# Patient Record
Sex: Male | Born: 1935 | Race: White | Hispanic: No | Marital: Married | Smoking: Former smoker
Health system: Southern US, Community
[De-identification: ages and names within clinical notes are randomized; demographics above are authoritative.]

## PROBLEM LIST (undated history)

## (undated) DIAGNOSIS — I9789 Other postprocedural complications and disorders of the circulatory system, not elsewhere classified: Secondary | ICD-10-CM

## (undated) DIAGNOSIS — G250 Essential tremor: Secondary | ICD-10-CM

## (undated) DIAGNOSIS — G454 Transient global amnesia: Secondary | ICD-10-CM

## (undated) DIAGNOSIS — Z87448 Personal history of other diseases of urinary system: Secondary | ICD-10-CM

## (undated) DIAGNOSIS — G5790 Unspecified mononeuropathy of unspecified lower limb: Secondary | ICD-10-CM

## (undated) DIAGNOSIS — M1990 Unspecified osteoarthritis, unspecified site: Secondary | ICD-10-CM

## (undated) DIAGNOSIS — Z8551 Personal history of malignant neoplasm of bladder: Secondary | ICD-10-CM

## (undated) DIAGNOSIS — I2510 Atherosclerotic heart disease of native coronary artery without angina pectoris: Secondary | ICD-10-CM

## (undated) DIAGNOSIS — Z951 Presence of aortocoronary bypass graft: Secondary | ICD-10-CM

## (undated) DIAGNOSIS — R7303 Prediabetes: Secondary | ICD-10-CM

## (undated) DIAGNOSIS — I420 Dilated cardiomyopathy: Secondary | ICD-10-CM

## (undated) DIAGNOSIS — R2689 Other abnormalities of gait and mobility: Secondary | ICD-10-CM

## (undated) DIAGNOSIS — R251 Tremor, unspecified: Secondary | ICD-10-CM

## (undated) DIAGNOSIS — Z9861 Coronary angioplasty status: Secondary | ICD-10-CM

## (undated) DIAGNOSIS — Z973 Presence of spectacles and contact lenses: Secondary | ICD-10-CM

## (undated) DIAGNOSIS — G459 Transient cerebral ischemic attack, unspecified: Secondary | ICD-10-CM

## (undated) DIAGNOSIS — Z7901 Long term (current) use of anticoagulants: Secondary | ICD-10-CM

## (undated) DIAGNOSIS — Z87442 Personal history of urinary calculi: Secondary | ICD-10-CM

## (undated) DIAGNOSIS — Z85828 Personal history of other malignant neoplasm of skin: Secondary | ICD-10-CM

## (undated) DIAGNOSIS — Z9889 Other specified postprocedural states: Secondary | ICD-10-CM

## (undated) DIAGNOSIS — G629 Polyneuropathy, unspecified: Secondary | ICD-10-CM

## (undated) DIAGNOSIS — R351 Nocturia: Secondary | ICD-10-CM

## (undated) DIAGNOSIS — E785 Hyperlipidemia, unspecified: Secondary | ICD-10-CM

## (undated) DIAGNOSIS — I4891 Unspecified atrial fibrillation: Secondary | ICD-10-CM

## (undated) DIAGNOSIS — C679 Malignant neoplasm of bladder, unspecified: Secondary | ICD-10-CM

## (undated) DIAGNOSIS — Z8673 Personal history of transient ischemic attack (TIA), and cerebral infarction without residual deficits: Secondary | ICD-10-CM

## (undated) DIAGNOSIS — M810 Age-related osteoporosis without current pathological fracture: Secondary | ICD-10-CM

## (undated) DIAGNOSIS — Z972 Presence of dental prosthetic device (complete) (partial): Secondary | ICD-10-CM

## (undated) HISTORY — DX: Presence of aortocoronary bypass graft: Z95.1

## (undated) HISTORY — DX: Unspecified atrial fibrillation: I48.91

## (undated) HISTORY — DX: Malignant neoplasm of bladder, unspecified: C67.9

## (undated) HISTORY — PX: CATARACT EXTRACTION W/ INTRAOCULAR LENS  IMPLANT, BILATERAL: SHX1307

## (undated) HISTORY — PX: COCCYX REMOVAL: SHX600

## (undated) HISTORY — DX: Atherosclerotic heart disease of native coronary artery without angina pectoris: I25.10

## (undated) HISTORY — PX: CORONARY ARTERY BYPASS GRAFT: SHX141

## (undated) HISTORY — DX: Dilated cardiomyopathy: I42.0

## (undated) HISTORY — DX: Atherosclerotic heart disease of native coronary artery without angina pectoris: Z98.61

## (undated) HISTORY — PX: FINGER SURGERY: SHX640

## (undated) HISTORY — PX: TONSILLECTOMY: SUR1361

## (undated) HISTORY — DX: Hyperlipidemia, unspecified: E78.5

## (undated) HISTORY — DX: Other postprocedural complications and disorders of the circulatory system, not elsewhere classified: I97.89

## (undated) HISTORY — PX: APPENDECTOMY: SHX54

---
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
## 1957-09-02 DIAGNOSIS — Z87442 Personal history of urinary calculi: Secondary | ICD-10-CM

## 1957-09-02 HISTORY — DX: Personal history of urinary calculi: Z87.442

## 1986-09-02 HISTORY — PX: OTHER SURGICAL HISTORY: SHX169

## 1987-09-03 HISTORY — PX: KIDNEY STONE SURGERY: SHX686

## 1989-09-02 HISTORY — PX: CORONARY ANGIOPLASTY WITH STENT PLACEMENT: SHX49

## 1998-07-20 ENCOUNTER — Ambulatory Visit: Admission: RE | Admit: 1998-07-20 | Discharge: 1998-07-20 | Payer: Self-pay | Admitting: Cardiology

## 2000-06-20 ENCOUNTER — Encounter: Admission: RE | Admit: 2000-06-20 | Discharge: 2000-06-20 | Payer: Self-pay | Admitting: Orthopedic Surgery

## 2000-06-20 ENCOUNTER — Encounter: Payer: Self-pay | Admitting: Orthopedic Surgery

## 2004-10-18 HISTORY — PX: DOPPLER ECHOCARDIOGRAPHY: SHX263

## 2006-03-17 ENCOUNTER — Ambulatory Visit (HOSPITAL_COMMUNITY): Admission: EM | Admit: 2006-03-17 | Discharge: 2006-03-17 | Payer: Self-pay | Admitting: Emergency Medicine

## 2007-12-29 ENCOUNTER — Encounter: Admission: RE | Admit: 2007-12-29 | Discharge: 2007-12-29 | Payer: Self-pay | Admitting: Orthopedic Surgery

## 2010-09-02 HISTORY — PX: CARDIOVERSION: SHX1299

## 2010-10-03 DIAGNOSIS — Z951 Presence of aortocoronary bypass graft: Secondary | ICD-10-CM

## 2010-10-03 DIAGNOSIS — I4891 Unspecified atrial fibrillation: Secondary | ICD-10-CM

## 2010-10-03 HISTORY — DX: Presence of aortocoronary bypass graft: Z95.1

## 2010-10-03 HISTORY — DX: Other postprocedural complications and disorders of the circulatory system, not elsewhere classified: I48.91

## 2010-10-17 ENCOUNTER — Inpatient Hospital Stay (HOSPITAL_COMMUNITY)
Admission: RE | Admit: 2010-10-17 | Discharge: 2010-10-22 | DRG: 234 | Disposition: A | Discharge status: Home / Self Care | Payer: Medicare Other | Source: Ambulatory Visit | Attending: Thoracic Surgery (Cardiothoracic Vascular Surgery) | Admitting: Thoracic Surgery (Cardiothoracic Vascular Surgery)

## 2010-10-17 DIAGNOSIS — Z9861 Coronary angioplasty status: Secondary | ICD-10-CM

## 2010-10-17 DIAGNOSIS — E8779 Other fluid overload: Secondary | ICD-10-CM | POA: Diagnosis not present

## 2010-10-17 DIAGNOSIS — Y840 Cardiac catheterization as the cause of abnormal reaction of the patient, or of later complication, without mention of misadventure at the time of the procedure: Secondary | ICD-10-CM | POA: Diagnosis present

## 2010-10-17 DIAGNOSIS — Z87891 Personal history of nicotine dependence: Secondary | ICD-10-CM

## 2010-10-17 DIAGNOSIS — Y832 Surgical operation with anastomosis, bypass or graft as the cause of abnormal reaction of the patient, or of later complication, without mention of misadventure at the time of the procedure: Secondary | ICD-10-CM | POA: Diagnosis not present

## 2010-10-17 DIAGNOSIS — T82897A Other specified complication of cardiac prosthetic devices, implants and grafts, initial encounter: Secondary | ICD-10-CM | POA: Diagnosis present

## 2010-10-17 DIAGNOSIS — Z01810 Encounter for preprocedural cardiovascular examination: Secondary | ICD-10-CM

## 2010-10-17 DIAGNOSIS — I2510 Atherosclerotic heart disease of native coronary artery without angina pectoris: Principal | ICD-10-CM | POA: Diagnosis present

## 2010-10-17 DIAGNOSIS — D62 Acute posthemorrhagic anemia: Secondary | ICD-10-CM | POA: Diagnosis not present

## 2010-10-17 DIAGNOSIS — Z7982 Long term (current) use of aspirin: Secondary | ICD-10-CM

## 2010-10-17 DIAGNOSIS — I4891 Unspecified atrial fibrillation: Secondary | ICD-10-CM | POA: Diagnosis not present

## 2010-10-17 DIAGNOSIS — Z22322 Carrier or suspected carrier of Methicillin resistant Staphylococcus aureus: Secondary | ICD-10-CM

## 2010-10-17 DIAGNOSIS — Y9210 Unspecified residential institution as the place of occurrence of the external cause: Secondary | ICD-10-CM | POA: Diagnosis not present

## 2010-10-17 DIAGNOSIS — E7800 Pure hypercholesterolemia, unspecified: Secondary | ICD-10-CM | POA: Diagnosis present

## 2010-10-17 DIAGNOSIS — I519 Heart disease, unspecified: Secondary | ICD-10-CM | POA: Diagnosis not present

## 2010-10-17 DIAGNOSIS — I209 Angina pectoris, unspecified: Secondary | ICD-10-CM | POA: Diagnosis present

## 2010-10-17 DIAGNOSIS — Y92009 Unspecified place in unspecified non-institutional (private) residence as the place of occurrence of the external cause: Secondary | ICD-10-CM

## 2010-10-17 DIAGNOSIS — E785 Hyperlipidemia, unspecified: Secondary | ICD-10-CM | POA: Diagnosis present

## 2010-10-17 HISTORY — PX: CARDIAC CATHETERIZATION: SHX172

## 2010-10-17 LAB — PROTIME-INR
INR: 0.95 (ref 0.00–1.49)
Prothrombin Time: 12.9 seconds (ref 11.6–15.2)

## 2010-10-17 LAB — ABO/RH: ABO/RH(D): A POS

## 2010-10-17 LAB — TYPE AND SCREEN: ABO/RH(D): A POS

## 2010-10-18 ENCOUNTER — Inpatient Hospital Stay (HOSPITAL_COMMUNITY): Payer: Medicare Other

## 2010-10-18 DIAGNOSIS — I2510 Atherosclerotic heart disease of native coronary artery without angina pectoris: Secondary | ICD-10-CM

## 2010-10-18 LAB — CREATININE, SERUM
Creatinine, Ser: 0.85 mg/dL (ref 0.4–1.5)
GFR calc non Af Amer: >60 mL/min (ref >60)

## 2010-10-18 LAB — POCT I-STAT 3, ART BLOOD GAS (G3+)
Acid-base deficit: 1 mmol/L (ref 0.0–2.0)
Bicarbonate: 23.4 mEq/L (ref 20.0–24.0)
Bicarbonate: 21.1 mEq/L (ref 20.0–24.0)
O2 Saturation: 99 %
Patient temperature: 33.6
Patient temperature: 37.3
TCO2: 24 mmol/L (ref 0–100)
pCO2 arterial: 39.3 mmHg (ref 35.0–45.0)
pH, Arterial: 7.34 — ABNORMAL LOW (ref 7.350–7.450)
pO2, Arterial: 140 mmHg — ABNORMAL HIGH (ref 80.0–100.0)
pO2, Arterial: 124 mmHg — ABNORMAL HIGH (ref 80.0–100.0)

## 2010-10-18 LAB — URINALYSIS, MICROSCOPIC ONLY
Bilirubin Urine: NEGATIVE
Hgb urine dipstick: NEGATIVE
Specific Gravity, Urine: 1.017 (ref 1.005–1.030)
Urine Glucose, Fasting: NEGATIVE mg/dL
Urobilinogen, UA: 1 mg/dL (ref 0.0–1.0)

## 2010-10-18 LAB — POCT I-STAT 4, (NA,K, GLUC, HGB,HCT)
Glucose, Bld: 98 mg/dL (ref 70–99)
Glucose, Bld: 96 mg/dL (ref 70–99)
Hemoglobin: 10.5 g/dL — ABNORMAL LOW (ref 13.0–17.0)
Hemoglobin: 10.2 g/dL — ABNORMAL LOW (ref 13.0–17.0)
Potassium: 3.9 mEq/L (ref 3.5–5.1)
Potassium: 3.8 mEq/L (ref 3.5–5.1)
Potassium: 3.8 mEq/L (ref 3.5–5.1)
Potassium: 3.9 mEq/L (ref 3.5–5.1)
Sodium: 139 mEq/L (ref 135–145)
Sodium: 140 mEq/L (ref 135–145)
Sodium: 142 mEq/L (ref 135–145)

## 2010-10-18 LAB — COMPREHENSIVE METABOLIC PANEL
ALT: 18 U/L (ref 0–53)
AST: 24 U/L (ref 0–37)
Calcium: 9.1 mg/dL (ref 8.4–10.5)
Creatinine, Ser: 0.91 mg/dL (ref 0.4–1.5)
GFR calc Af Amer: >60 mL/min (ref >60)
GFR calc non Af Amer: >60 mL/min (ref >60)
Sodium: 140 mEq/L (ref 135–145)
Total Protein: 5.7 g/dL — ABNORMAL LOW (ref 6.0–8.3)

## 2010-10-18 LAB — BLOOD GAS, ARTERIAL
Drawn by: 312881
FIO2: 0.21 %
Patient temperature: 98.6
TCO2: 26.9 mmol/L (ref 0–100)
pCO2 arterial: 38.9 mmHg (ref 35.0–45.0)
pH, Arterial: 7.435 (ref 7.350–7.450)

## 2010-10-18 LAB — CBC
HCT: 32.3 % — ABNORMAL LOW (ref 39.0–52.0)
Hemoglobin: 11.1 g/dL — ABNORMAL LOW (ref 13.0–17.0)
MCH: 31.6 pg (ref 26.0–34.0)
MCHC: 33.9 g/dL (ref 30.0–36.0)
MCHC: 34.4 g/dL (ref 30.0–36.0)
MCV: 92 fL (ref 78.0–100.0)
MCV: 92.6 fL (ref 78.0–100.0)
Platelets: 139 10*3/uL — ABNORMAL LOW (ref 150–400)
Platelets: 115 10*3/uL — ABNORMAL LOW (ref 150–400)
Platelets: 110 10*3/uL — ABNORMAL LOW (ref 150–400)
RBC: 3.51 MIL/uL — ABNORMAL LOW (ref 4.22–5.81)
RDW: 12.2 % (ref 11.5–15.5)
RDW: 12.2 % (ref 11.5–15.5)
WBC: 4.9 10*3/uL (ref 4.0–10.5)
WBC: 5.6 10*3/uL (ref 4.0–10.5)
WBC: 7.5 10*3/uL (ref 4.0–10.5)

## 2010-10-18 LAB — POCT I-STAT, CHEM 8
BUN: 14 mg/dL (ref 6–23)
Hemoglobin: 11.2 g/dL — ABNORMAL LOW (ref 13.0–17.0)
Potassium: 4.2 mEq/L (ref 3.5–5.1)
Sodium: 142 mEq/L (ref 135–145)
TCO2: 21 mmol/L (ref 0–100)

## 2010-10-18 LAB — POCT I-STAT GLUCOSE
Operator id: 177011
Operator id: 3406

## 2010-10-18 LAB — GLUCOSE, CAPILLARY: Glucose-Capillary: 112 mg/dL — ABNORMAL HIGH (ref 70–99)

## 2010-10-18 LAB — LIPID PANEL
Cholesterol: 140 mg/dL (ref 0–200)
HDL: 42 mg/dL (ref >39)
Total CHOL/HDL Ratio: 3.3 RATIO
Triglycerides: 95 mg/dL (ref <150)

## 2010-10-18 LAB — APTT
aPTT: 30 seconds (ref 24–37)
aPTT: 31 seconds (ref 24–37)

## 2010-10-18 NOTE — Consult Note (Signed)
[name], [name]               ACCOUNT NO.:  [id number]  MEDICAL RECORD NO.:  [id number]           PATIENT TYPE:  I  LOCATION:  4729                         FACILITY:  [facility]  PHYSICIAN:  Salvatore Decent. Cornelius Moras, M.D. DATE OF BIRTH:  [birth year]  DATE OF CONSULTATION:  10/17/2010 DATE OF DISCHARGE:                                CONSULTATION   REQUESTING PHYSICIAN:  Ritta Slot, MD.  REASON FOR CONSULTATION:  Severe two-vessel coronary artery disease with new-onset angina pectoris.  HISTORY OF PRESENT ILLNESS:  Mr. Reason is an 77 year old gentleman with history of coronary artery disease and hyperlipidemia.  Cardiac history dates back more than 20 years ago wherein he underwent PCI and stenting of his LAD and RCA for two vessel coronary artery disease with symptoms of exertional angina.  He has done well since then until presently.  The patient remains quite active physically.  He exercises regularly.  About a month ago, he first began to experience exertional shortness of breath which was new for him.  Symptoms were mild.  Last week, the patient developed new-onset exertional chest discomfort described as a pressure-like pain across his midchest that was brought on with physical activity and relieved by rest.  He presented to Dr. Lynnea Ferrier who scheduled elective catheterization for earlier today. Catheterization demonstrates severe two-vessel coronary artery disease with 80% stenosis of the proximal left anterior descending coronary artery and high-grade 95% stenosis of mid right coronary artery with 75% stenosis of the distal right coronary artery.  There is right-dominant coronary circulation.  There is normal left ventricular function.  No other significant abnormality is noted.  The patient has coronary anatomy that is felt to be unfavorable for repeat PCI and stenting, and he has been referred for elective surgical revascularization.  REVIEW OF SYSTEMS:  GENERAL:  The patient  reports normal appetite.  He has not been gaining nor losing weight recently.  CARDIAC:  Notable for new-onset symptoms of exertional chest discomfort consistent with angina pectoris.  The patient denies resting chest pain.  The patient denies resting shortness of breath.  He has had recent onset of exertional shortness of breath.  He denies PND, orthopnea, lower extremity edema. Has not had dizzy spells or syncope.  RESPIRATORY:  Negative.  The patient denies productive cough, hemoptysis, wheezing. GASTROINTESTINAL:  Negative.  The patient has no difficulty swallowing. He reports normal bowel function.  He denies hematochezia, hematemesis, melena.  GENITOURINARY:  Notable for some slow starting of his urinary stream, consistent with benign prostatic hypertrophy.  He denies any urgency or frequency.  MUSCULOSKELETAL:  Negative.  NEUROLOGIC:  Notable for occasional mild numbness involving the left forearm and the right lower leg.  These symptoms seem to be positional in nature and transient.  The patient denies transient monocular blindness or transient numbness or weakness suggestive of TIA or stroke.  HEENT: Negative.  INFECTIOUS:  Negative.  PAST MEDICAL HISTORY: 1. Coronary artery disease. 2. Hyperlipidemia. 3. 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  PAST SURGICAL HISTORY: 1. Right shoulder surgery. 2. Appendectomy. 3. Tonsillectomy.  FAMILY HISTORY:  Noncontributory.  SOCIAL HISTORY:  The patient  is married, lives locally here in [place].  He exercises regularly and remains very active and vigorous for a gentleman his age.  He has a remote history of tobacco use, but he quit smoking in 1955.  He does not use any alcohol.  MEDICATIONS PRIOR TO ADMISSION: 1. Multivitamin. 2. Fish oil. 3. Aspirin. 4. Propranolol. 5. Lipitor.  DRUG ALLERGIES:  None known.  PHYSICAL EXAMINATION:  GENERAL:  The patient is a thin, well-appearing male who appears his stated age, in  no acute distress. HEENT:  Unrevealing. NECK:  Supple.  There is no cervical or supraclavicular lymphadenopathy. There is no jugular venous distention.  No carotid bruits are noted. CHEST:  Auscultation of the chest includes clear breath sounds that are symmetrical anteriorly.  No wheezes, rales or rhonchi noted. CARDIOVASCULAR:  Regular rate and rhythm.  No murmurs, rubs, or gallops are appreciated. ABDOMEN:  Soft, nontender.  Bowel sounds are present. EXTREMITIES:  Warm and well perfused.  There is no lower extremity edema.  Distal pulses are palpable in the posterior tibial position. There is no sign of venous insufficiency. RECTAL AND GU:  Both deferred. NEUROLOGIC:  Grossly nonfocal and symmetrical throughout.  DIAGNOSTIC TESTS:  Cardiac catheterization performed by Dr. Lynnea Ferrier earlier today is reviewed.  This demonstrates 80% stenosis of the proximal left anterior descending coronary artery.  There is high-grade 95% proximal stenosis of the right coronary artery, several tandem 75% lesions in the distal right coronary artery.  There is right-dominant coronary circulation.  There are no significant flow-limiting lesions in the left circumflex system.  Left ventricular function appears normal. There are no wall motion abnormalities.  No other abnormalities are noted.  IMPRESSION:  Severe two-vessel coronary artery disease with recent onset symptoms of exertional angina.  There is normal left ventricular function.  Coronary anatomy is relatively unfavorable for repeat PCI and stenting.  I agree that Mr. Dispenza would probably best be treated with surgical revascularization.  Despite his advanced age, he appears to be in excellent shape and should be a relatively good candidate for coronary artery bypass grafting.  PLAN:  I have discussed the options at length with Mr. Czerniak here in his hospital room this afternoon.  Alternative treatment strategies have been discussed.  He  understands and accepts all potential associated risks of surgery including but not limited to risk of death, stroke, myocardial infarction, congestive heart failure, respiratory failure, renal failure, pneumonia, bleeding requiring blood transfusion, arrhythmia, infection, and recurrent coronary artery disease.  All of his questions have been answered.  We tentatively plan to proceed with surgery tomorrow morning.     Salvatore Decent. Cornelius Moras, M.D.     CHO/MEDQ  D:  10/17/2010  T:  10/18/2010  Job:  [id number]  cc:   Dr. Shary Decamp Dr. Maurine Minister  Electronically Signed by Tressie Stalker M.D. on 10/18/2010 06:46:51 AM

## 2010-10-19 ENCOUNTER — Inpatient Hospital Stay (HOSPITAL_COMMUNITY): Payer: Medicare Other

## 2010-10-19 LAB — BASIC METABOLIC PANEL
BUN: 16 mg/dL (ref 6–23)
CO2: 25 mEq/L (ref 19–32)
Chloride: 109 mEq/L (ref 96–112)
Creatinine, Ser: 0.86 mg/dL (ref 0.4–1.5)
Glucose, Bld: 181 mg/dL — ABNORMAL HIGH (ref 70–99)
Potassium: 3.9 mEq/L (ref 3.5–5.1)

## 2010-10-19 LAB — CBC
HCT: 29.4 % — ABNORMAL LOW (ref 39.0–52.0)
MCH: 31.9 pg (ref 26.0–34.0)
MCV: 92.7 fL (ref 78.0–100.0)
Platelets: 109 10*3/uL — ABNORMAL LOW (ref 150–400)
RBC: 3.17 MIL/uL — ABNORMAL LOW (ref 4.22–5.81)

## 2010-10-19 LAB — GLUCOSE, CAPILLARY
Glucose-Capillary: 142 mg/dL — ABNORMAL HIGH (ref 70–99)
Glucose-Capillary: 159 mg/dL — ABNORMAL HIGH (ref 70–99)

## 2010-10-19 LAB — MAGNESIUM: Magnesium: 2.2 mg/dL (ref 1.5–2.5)

## 2010-10-19 NOTE — Op Note (Signed)
Tanner [name], FRICKE NO.:  [id number]  MEDICAL RECORD NO.:  [id number]           PATIENT TYPE:  LOCATION:                                 FACILITY:  PHYSICIAN:  Salvatore Decent. Cornelius Moras, M.D. DATE OF BIRTH:  10/12/1923  DATE OF PROCEDURE:  10/18/2010 DATE OF DISCHARGE:                              OPERATIVE REPORT   PREOPERATIVE DIAGNOSIS:  Severe two-vessel coronary artery disease.  POSTOPERATIVE DIAGNOSIS:  Severe two-vessel coronary artery disease.  PROCEDURE:  Median sternotomy for off-pump coronary artery bypass grafting x2 (left internal mammary artery to distal left anterior descending coronary artery, saphenous vein graft to posterior descending coronary artery, endoscopic saphenous vein harvest from right thigh).  SURGEON:  Salvatore Decent. Cornelius Moras, MD  ASSISTANT:  Doree Fudge, PA  ANESTHESIA:  Kaylyn Layer. Michelle Piper, MD  BRIEF CLINICAL NOTE:  The patient is an 77 year old gentleman with history of coronary artery disease and hyperlipidemia.  The patient developed recent onset exertional angina.  Cardiac catheterization performed by Dr. Lynnea Ferrier demonstrates severe two-vessel coronary artery disease with normal left ventricular function and coronary anatomy that is relatively unfavorable for percutaneous coronary intervention.  A full consultation note has been dictated previously.  The patient has been counseled regarding the indications, risks, and potential benefits of surgery.  Alternative treatment strategies have been discussed.  The patient understands and accepts all associated risks and desired to proceed with surgery as described.  OPERATIVE FINDINGS: 1. Normal left ventricular function. 2. Mild aortic insufficiency. 3. Good-quality left internal mammary artery and saphenous vein     conduit for grafting. 4. Good-quality target vessels for grafting.  OPERATIVE PROCEDURE IN DETAIL:  The patient was brought to the operating room on the  above-mentioned date and central monitoring was established by the Anesthesia Team under the care and direction of Dr. Arta Bruce. Specifically, a Swan-Ganz catheter was placed through the right internal jugular approach.  A radial arterial line was placed.  Intravenous antibiotics were administered.  The patient was placed in the supine position on the operating table.  General endotracheal anesthesia was induced uneventfully.  A Foley catheter was placed.  Baseline transesophageal echocardiogram was performed by Dr. Michelle Piper.  This demonstrates normal left ventricular function.  There is mild central aortic insufficiency.  No other abnormalities were noted.  The patient's chest, abdomen, both groins, and both lower extremities were prepared and draped in sterile manner.  The greater saphenous vein was removed from the patient's right thigh using endoscopic vein harvest technique through a small incision made just above the right knee.  The saphenous vein is good-quality conduit.  After the saphenous vein has been removed, the small incision in the right thigh is closed with absorbable suture.  A median sternotomy incision was performed.  The left internal mammary artery was dissected from the chest wall and prepared for bypass grafting.  The left internal mammary artery is good-quality conduit. Following systemic heparinization, the left internal mammary artery was transected distally and noted to have excellent flow.  The pericardium was opened.  The ascending aorta is normal in appearance.  Distal target vessels were selected for  coronary bypass grafting.  Of note, the diagonal branch of the left anterior descending coronary artery is quite small and chronically occluded.  The [place] acrobat off-pump cardiac stabilization system was utilized to facilitate off-pump coronary artery bypass grafting.  Both the apical suction cup and the U-shaped stabilization bar are utilized.   Elastic vessel loops were used for proximal and distal vascular control. Intracoronary shunts were not necessary.  The following distal coronary anastomoses were performed: 1. The posterior descending coronary artery is grafted with a     saphenous vein graft in end-to-side fashion.  This vessel measured     1.8 mm in diameter and is good-quality target vessel for grafting. 2. The distal left anterior descending coronary artery is grafted with     left internal mammary artery in end-to-side fashion.  This vessel     measures 2.0 mm in diameter and is a good-quality target vessel for     grafting.  The single proximal saphenous vein anastomoses was performed directly to the ascending aorta using the Heartstring proximal hemostatic device without need for any type of the aortic crossclamp or manipulation. Following completion of the proximal anastomoses, both bypass grafts were carefully inspected for meticulous hemostasis and appropriate graft orientation.  Protamine was administered to reverse anticoagulation.  The mediastinum and the left chest are irrigated with saline solution. Surgical hemostasis was ascertained.  Epicardial pacing wires were fixed to the right atrial appendage.  The mediastinum and the left pleural space were drained using three chest tubes placed through separate stab incisions inferiorly.  The pericardium and soft tissues anterior to the aorta are reapproximated loosely.  The sternum was closed using double- strength sternal wire.  A soft tissues anterior to the sternum were closed in multiple layers and the skin was closed with a running subcuticular skin closure.  The patient tolerated the procedure well and was transported to surgical intensive care unit in stable condition.  There are no intraoperative complications.  All sponge, instrument, and needle counts were verified and correct at completion of the operation.  No blood products  were administered.     Salvatore Decent. Cornelius Moras, M.D.     CHO/MEDQ  D:  10/18/2010  T:  10/19/2010  Job:  [id number]  cc:   Thayer Headings, M.D. Brooke Bonito, M.D. Ritta Slot, MD  Electronically Signed by Tressie Stalker M.D. on 10/19/2010 10:22:18 PM

## 2010-10-20 ENCOUNTER — Inpatient Hospital Stay (HOSPITAL_COMMUNITY): Payer: Medicare Other

## 2010-10-20 LAB — BASIC METABOLIC PANEL
BUN: 20 mg/dL (ref 6–23)
CO2: 27 mEq/L (ref 19–32)
Chloride: 104 mEq/L (ref 96–112)
Creatinine, Ser: 0.86 mg/dL (ref 0.4–1.5)
Glucose, Bld: 147 mg/dL — ABNORMAL HIGH (ref 70–99)
Potassium: 4.4 mEq/L (ref 3.5–5.1)

## 2010-10-20 LAB — CBC
HCT: 28.5 % — ABNORMAL LOW (ref 39.0–52.0)
Hemoglobin: 9.5 g/dL — ABNORMAL LOW (ref 13.0–17.0)
MCH: 31.4 pg (ref 26.0–34.0)
MCV: 94.1 fL (ref 78.0–100.0)
RBC: 3.03 MIL/uL — ABNORMAL LOW (ref 4.22–5.81)
WBC: 8.6 10*3/uL (ref 4.0–10.5)

## 2010-10-22 LAB — COMPREHENSIVE METABOLIC PANEL
ALT: 21 U/L (ref 0–53)
AST: 27 U/L (ref 0–37)
CO2: 28 mEq/L (ref 19–32)
Calcium: 8.3 mg/dL — ABNORMAL LOW (ref 8.4–10.5)
Chloride: 105 mEq/L (ref 96–112)
Creatinine, Ser: 0.85 mg/dL (ref 0.4–1.5)
GFR calc Af Amer: >60 mL/min (ref >60)
GFR calc non Af Amer: >60 mL/min (ref >60)
Glucose, Bld: 131 mg/dL — ABNORMAL HIGH (ref 70–99)
Total Bilirubin: 1.4 mg/dL — ABNORMAL HIGH (ref 0.3–1.2)

## 2010-10-30 NOTE — Discharge Summary (Signed)
NAMEJOSELUIS, ALESSIO NO.:  [id number]  MEDICAL RECORD NO.:  [id number]           PATIENT TYPE:  I  LOCATION:  2015                         FACILITY:  [facility]  PHYSICIAN:  Salvatore Decent. Cornelius Moras, M.D. DATE OF BIRTH:  19-Jun-1924  DATE OF ADMISSION:  10/17/2010 DATE OF DISCHARGE:                              DISCHARGE SUMMARY   HISTORY:  The patient is an 77 year old gentleman with a history of coronary artery disease and hyperlipidemia.  His cardiac history dates back more than 20 years ago at which time he underwent PCI and stenting of his right coronary artery for single-vessel coronary artery disease with symptoms of exertional angina.  He has done well until recently. He does remain quite active physically.  He exercises regularly.  About a month ago, he first began to experience exertional shortness of breath, which was new for him.  Symptoms were mild.  On the week prior to this admission, he developed new onset exertional chest discomfort described as pressure-like pain across his mid chest that was brought on by physical activity and relieved by rest.  He presented to Dr. Lynnea Ferrier who has scheduled elective cardiac catheterization for which he was admitted.  REVIEW OF SYMPTOMS:  Please see the history and physical.  PAST MEDICAL HISTORY: 1. Coronary artery disease. 2. Hyperlipidemia. 3. 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  PAST SURGICAL HISTORY: 1. Right shoulder surgery. 2. Appendectomy. 3. Tonsillectomy.  FAMILY HISTORY:  Noncontributory.  SOCIAL HISTORY:  He is married, lives in [place].  He exercises regularly and remains very active and vigorous for a gentleman his age. He has a remote history of tobacco use, but quit in 1955.  He does not use any alcohol.  MEDICATIONS PRIOR TO ADMISSION: 1. Multivitamin. 2. Fish oil. 3. Aspirin. 4. Propranolol. 5. Lipitor.  ALLERGIES:  No known drug allergies.  PHYSICAL EXAMINATION:  Please  see the history and physical done at the time of admission.  HOSPITAL COURSE:  The patient was admitted and taken to the Cardiac Catheterization Lab where he was found to have an 80% stenosis of the proximal left anterior descending coronary artery.  There is a high- grade 95% proximal stenosis of the right coronary artery, several tandem 75% lesions in the distal right coronary artery.  There is right dominant coronary circulation.  There were no significant flow limiting lesions in the left circumflex system.  Left ventricular function appeared normal.  There were no wall motion abnormalities.  There were no other abnormalities noted.  Due to these findings, surgical consultation was obtained with Tressie Stalker, MD who evaluated the patient and studies and agreed with recommendations to proceed with surgical revascularization.  PROCEDURE:  On October 18, 2010, he was taken the operating room where he underwent off-pump coronary artery bypass grafting x2 with the following grafts placed. 1. Left internal mammary artery to the distal left anterior descending     coronary artery. 2. A saphenous vein graft to the posterior descending coronary artery.     The patient tolerated procedure well.  OPERATIVE FINDINGS: 1. Normal left ventricular function. 2. Mild aortic insufficiency. 3.  Good-quality left internal mammary artery and saphenous vein     conduit for grafting. 4. Good quality target vessels for grafting.  The patient was taken to the surgical intensive care unit in a stable condition.  POSTOPERATIVE HOSPITAL COURSE:  The patient has done well.  The patient was screened positive for MRSA prior to procedure.  He was weaned from the ventilator without difficulty.  He has remained hemodynamically stable.  He did have an episode of postoperative atrial fibrillation with it subsequently being chemically cardioverted to normal sinus rhythm with amiodarone and beta-blockade.  All  routine lines, monitors, and drainage devices have been discontinued in the standard fashion.  He is tolerating activity well using standard protocols.  He does have an acute blood loss anemia.  His most recent hemoglobin/hematocrit dated October 21, 2010, are 9.5 and 28.5 respectively.  Electrolytes, BUN, and creatinine are within normal limits.  Overall the patient's status is felt to be tentatively stable for discharge in the morning of October 22, 2010, pending morning round reevaluation.  MEDICATIONS AT DISCHARGE: 1. Amiodarone 400 mg b.i.d. for an additional 7 days then change to     400 mg daily. 2. Metoprolol 12.5 mg 1 tablet p.o. b.i.d. 3. Ultram 50 mg 1-2 every 6 hours as needed for pain. 4. Aspirin 325 mg tablet daily. 5. Fish oil 1 tablet daily. 6. Lipitor were 40 mg daily. 7. Multivitamin 1 tablet daily.  FINAL DIAGNOSIS:  Severe coronary artery disease as described above, now status post off-pump surgical revascularization, also as described above.  OTHER DIAGNOSES: 1. Previous history of percutaneous intervention for right coronary     artery disease. 2. History of hyperlipidemia.3. Postoperative acute blood loss anemia. 4. Postoperative atrial fibrillation.  INSTRUCTIONS:  The patient will receive written instructions regarding medications, activity, diet, wound care, and followup.  Followup will include Dr. Lynnea Ferrier 2 weeks post discharge, Dr. Cornelius Moras 3 weeks post discharge.  CONDITION ON DISCHARGE:  Stable and improved.     Rowe Clack, P.A.-C.   ______________________________ Salvatore Decent Cornelius Moras, M.D.    Sherryll Burger  D:  10/21/2010  T:  10/22/2010  Job:  [id number]  cc:   Ritta Slot, MD B. Theola Sequin, MD Thayer Headings, M.D.  Electronically Signed by Gershon Crane P.A.-C. on 10/30/2010 10:39:12 AM Electronically Signed by Tressie Stalker M.D. on 10/30/2010 05:40:30 PM

## 2010-10-31 NOTE — Procedures (Signed)
[name], MUSTARD NO.:  [id number]  MEDICAL RECORD NO.:  [id number]           PATIENT TYPE:  I  LOCATION:  4729                         FACILITY:  [facility]  PHYSICIAN:  Ritta Slot, MD     DATE OF BIRTH:  Apr 11, 1924  DATE OF PROCEDURE:  10/17/2010 DATE OF DISCHARGE:                           CARDIAC CATHETERIZATION   This is a left heart cardiac catheterization.  PATIENT PROFILE:  Mr. Tanner [name] is a very pleasant 77 year old, a very thin, healthy Caucasian gentleman, who is known to have coronary artery disease status post stenting to the LAD in 1991 by Dr. Charolette Child. He came into the office yesterday, complaining of shortness of breath and chest tightness, so we decided to proceed directly with cardiac catheterization.  After informed consent, his right groin was prepped and draped in usual sterile fashion and received 1 mg of Versed, 25 mcg of fentanyl.  Lidocaine 10 mL was used for local anesthetic.  Using modified Seldinger technique, a 5-French sheath was placed in the RFA without difficulty.  Through the 5-French sheath was placed, the JL-4 catheter was just placed in the left main without difficulty. Visualization of the left circulatory system was obtained in the AP cranial, LAO cranial, RAO cranial, RAO caudal, AP caudal, LAO caudal view.  The JL-4 catheter was then exchanged out for JR-4 catheter.  JR-4 catheter was placed in the RCA without difficulty.  Visualization of the RCA was obtained, LAO cranial, AP cranial, and RAO cranial view.  The JR- 4 catheter was then exchanged over J-wire for an angled pigtail.  Angled pigtail was placed in the LV without difficulty.  Visualization of the left ventricle was obtained using 36 mL of contrast at 12 mL per second in the LAO oblique view.  FINDINGS: 1. AO pressure 147/66, mean of 95.  LV pressure 149/8, LVEDP of 18.     LV pullback 138/6, LVEDP of 20.  AO pullback 150/71, mean of 103. 2. The right  coronary artery was found to have a 75% proximal     stenosis, 90% mid stenosis, sequential 75% and 70% distal stenosis     just prior to the bifurcation of the PLA and PDA.  Heavily     calcified vessel. 3. The left main was free of any disease.  It was found to bifurcate     into an LAD and circumflex.  The circumflex artery was free of any     disease, but small territory, collaterals with D1.  The LAD had a     stent in its proximal to midportion just prior to the stent of 80%     to 90% stenosis and distal to the stent of 75% stenosis.  D1 filled     by collaterals from the OM vessel.  LV function was found to be     normal without any evidence of either mitral regurgitation or     regional wall motion abnormality.  FINAL CONCLUSIONS: 1. 80% proximal stenosis with mild in-stent restenosis with 75% mid-     LAD stenosis after the stent. 2. Sequential multiple severe stenoses in  the RCA with heavily     calcified vessel, normal LV function.  PLAN:  I think he would be an excellent candidate for CABG.  I will get a CT chest consult.  I will admit him to tele bed.  We will start him on Imdur 30 mg daily.  We will get a 2-D echo and carotid anticipation of surgery.     Ritta Slot, MD     HS/MEDQ  D:  10/17/2010  T:  10/18/2010  Job:  [id number]  Electronically Signed by Ritta Slot MD on 10/31/2010 02:26:56 PM

## 2010-11-09 ENCOUNTER — Other Ambulatory Visit: Payer: Self-pay | Admitting: Thoracic Surgery (Cardiothoracic Vascular Surgery)

## 2010-11-09 DIAGNOSIS — I2510 Atherosclerotic heart disease of native coronary artery without angina pectoris: Secondary | ICD-10-CM

## 2010-11-12 ENCOUNTER — Ambulatory Visit
Admission: RE | Admit: 2010-11-12 | Discharge: 2010-11-12 | Disposition: A | Discharge status: Home / Self Care | Payer: Medicare Other | Source: Ambulatory Visit | Attending: Thoracic Surgery (Cardiothoracic Vascular Surgery) | Admitting: Thoracic Surgery (Cardiothoracic Vascular Surgery)

## 2010-11-12 ENCOUNTER — Encounter (HOSPITAL_COMMUNITY): Payer: Medicare Other

## 2010-11-12 ENCOUNTER — Encounter (INDEPENDENT_AMBULATORY_CARE_PROVIDER_SITE_OTHER): Payer: Self-pay | Admitting: Thoracic Surgery (Cardiothoracic Vascular Surgery)

## 2010-11-12 DIAGNOSIS — I2510 Atherosclerotic heart disease of native coronary artery without angina pectoris: Secondary | ICD-10-CM

## 2010-11-12 NOTE — Assessment & Plan Note (Signed)
OFFICE VISIT  Tanner [name], Tanner [name] A DOB:  18-Oct-1923                                        November 12, 2010 CHART #:  [id number]  HISTORY OF PRESENT ILLNESS:  The patient returns for routine follow up status post off-pump coronary artery bypass grafting x2 on October 18, 2010.  His postoperative recovery has been remarkably uncomplicated.  He was seen in the office by Dr. Lynnea Ferrier for followup and returns to our office for routine followup today.  The patient reports he is doing exceptionally well.  He has hardly had any pain at all and he has not required any sort of pain relievers.  He has no shortness of breath.  He has no dizzy spells.  His appetite has recovered nicely.  He is sleeping well at night.  His activity level is astonishingly good for a gentleman his age and he plans to start the cardiac rehab program next week. Overall, he has absolutely no complaints.  PHYSICAL EXAMINATION:  GENERAL:  A well-appearing male.  VITAL SIGNS: Blood pressure 130/77, pulse 58 and regular, and oxygen saturation 99% on room air.  CHEST:  Median sternotomy scar that is healing nicely. One Vicryl suture protruding through the incision is clipped and removed.  The sternum is stable.  Auscultation reveals clear breath sounds that are symmetrical bilaterally.  No wheezes, rales, or rhonchi are noted.  CARDIOVASCULAR:  Regular rate and rhythm.  No murmurs, rubs, or gallops are appreciated.  ABDOMEN:  Soft and nontender.  EXTREMITIES: Warm and well perfused.  The small incision from endoscopic vein harvest is healing nicely.  There is no lower extremity edema.  The remainder of his physical exam is unremarkable.  DIAGNOSTIC TEST:  Chest x-ray performed today at the [facility] is reviewed.  This demonstrates clear lung fields bilaterally. There are no significant pleural effusions.  All of the sternal wires appear intact.  The lungs are clear.  IMPRESSION:   Excellent progress following coronary artery bypass grafting x2.  PLAN:  I have encouraged the patient to continue to gradually increase his physical activity as tolerated with his only limitation at this point remaining that he refrain from heavy lifting or strenuous use of his arms or shoulders for at least another 2 months.  I think he can start driving an automobile, but I suggested that he might drive only short distances during the daylight for starters.  I have encouraged him to go ahead and get started the cardiac rehab program which he plans to do later this week.  All of his questions have been addressed.  In the future, he will call or return to see [place] as needed.  Salvatore Decent. Cornelius Moras, M.D. Electronically Signed  CHO/MEDQ  D:  11/12/2010  T:  11/12/2010  Job:  [id number]  cc:   Ritta Slot, MD Thayer Headings, M.D. Brooke Bonito, M.D.

## 2010-11-14 ENCOUNTER — Encounter (HOSPITAL_COMMUNITY): Payer: Medicare Other | Attending: Cardiology

## 2010-11-14 ENCOUNTER — Encounter (HOSPITAL_COMMUNITY): Payer: Medicare Other

## 2010-11-14 DIAGNOSIS — R0602 Shortness of breath: Secondary | ICD-10-CM | POA: Insufficient documentation

## 2010-11-14 DIAGNOSIS — Z9861 Coronary angioplasty status: Secondary | ICD-10-CM | POA: Insufficient documentation

## 2010-11-14 DIAGNOSIS — Z951 Presence of aortocoronary bypass graft: Secondary | ICD-10-CM | POA: Insufficient documentation

## 2010-11-14 DIAGNOSIS — Z5189 Encounter for other specified aftercare: Secondary | ICD-10-CM | POA: Insufficient documentation

## 2010-11-14 DIAGNOSIS — I4891 Unspecified atrial fibrillation: Secondary | ICD-10-CM | POA: Insufficient documentation

## 2010-11-14 DIAGNOSIS — E785 Hyperlipidemia, unspecified: Secondary | ICD-10-CM | POA: Insufficient documentation

## 2010-11-14 DIAGNOSIS — I2510 Atherosclerotic heart disease of native coronary artery without angina pectoris: Secondary | ICD-10-CM | POA: Insufficient documentation

## 2010-11-16 ENCOUNTER — Encounter (HOSPITAL_COMMUNITY): Payer: Medicare Other

## 2010-11-19 ENCOUNTER — Encounter (HOSPITAL_COMMUNITY): Payer: Medicare Other

## 2010-11-21 ENCOUNTER — Encounter (HOSPITAL_COMMUNITY): Payer: Medicare Other

## 2010-11-23 ENCOUNTER — Encounter (HOSPITAL_COMMUNITY): Payer: Medicare Other

## 2010-11-26 ENCOUNTER — Encounter (HOSPITAL_COMMUNITY): Payer: Medicare Other

## 2010-11-28 ENCOUNTER — Encounter (HOSPITAL_COMMUNITY): Payer: Medicare Other

## 2010-11-30 ENCOUNTER — Encounter (HOSPITAL_COMMUNITY): Payer: Medicare Other

## 2010-12-03 ENCOUNTER — Encounter (HOSPITAL_COMMUNITY): Payer: Medicare Other | Attending: Cardiology

## 2010-12-03 ENCOUNTER — Encounter (HOSPITAL_COMMUNITY): Payer: Medicare Other

## 2010-12-03 DIAGNOSIS — E785 Hyperlipidemia, unspecified: Secondary | ICD-10-CM | POA: Insufficient documentation

## 2010-12-03 DIAGNOSIS — I4891 Unspecified atrial fibrillation: Secondary | ICD-10-CM | POA: Insufficient documentation

## 2010-12-03 DIAGNOSIS — Z9861 Coronary angioplasty status: Secondary | ICD-10-CM | POA: Insufficient documentation

## 2010-12-03 DIAGNOSIS — R0602 Shortness of breath: Secondary | ICD-10-CM | POA: Insufficient documentation

## 2010-12-03 DIAGNOSIS — I2510 Atherosclerotic heart disease of native coronary artery without angina pectoris: Secondary | ICD-10-CM | POA: Insufficient documentation

## 2010-12-03 DIAGNOSIS — Z951 Presence of aortocoronary bypass graft: Secondary | ICD-10-CM | POA: Insufficient documentation

## 2010-12-03 DIAGNOSIS — Z5189 Encounter for other specified aftercare: Secondary | ICD-10-CM | POA: Insufficient documentation

## 2010-12-05 ENCOUNTER — Encounter (HOSPITAL_COMMUNITY): Payer: Medicare Other

## 2010-12-07 ENCOUNTER — Encounter (HOSPITAL_COMMUNITY): Payer: Medicare Other

## 2010-12-10 ENCOUNTER — Encounter (HOSPITAL_COMMUNITY): Payer: Medicare Other

## 2010-12-12 ENCOUNTER — Encounter (HOSPITAL_COMMUNITY): Payer: Medicare Other

## 2010-12-14 ENCOUNTER — Encounter (HOSPITAL_COMMUNITY): Payer: Medicare Other

## 2010-12-17 ENCOUNTER — Encounter (HOSPITAL_COMMUNITY): Payer: Medicare Other

## 2010-12-19 ENCOUNTER — Encounter (HOSPITAL_COMMUNITY): Payer: Medicare Other

## 2010-12-21 ENCOUNTER — Encounter (HOSPITAL_COMMUNITY): Payer: Medicare Other

## 2010-12-24 ENCOUNTER — Encounter (HOSPITAL_COMMUNITY): Payer: Medicare Other

## 2010-12-26 ENCOUNTER — Encounter (HOSPITAL_COMMUNITY): Payer: Medicare Other

## 2010-12-28 ENCOUNTER — Encounter (HOSPITAL_COMMUNITY): Payer: Medicare Other

## 2010-12-31 ENCOUNTER — Encounter (HOSPITAL_COMMUNITY): Payer: Medicare Other

## 2011-01-02 ENCOUNTER — Encounter (HOSPITAL_COMMUNITY): Payer: Medicare Other

## 2011-01-04 ENCOUNTER — Encounter (HOSPITAL_COMMUNITY): Payer: Medicare Other

## 2011-01-07 ENCOUNTER — Encounter (HOSPITAL_COMMUNITY): Payer: Medicare Other

## 2011-01-09 ENCOUNTER — Encounter (HOSPITAL_COMMUNITY): Payer: Medicare Other

## 2011-01-11 ENCOUNTER — Encounter (HOSPITAL_COMMUNITY): Payer: Medicare Other

## 2011-01-14 ENCOUNTER — Encounter (HOSPITAL_COMMUNITY): Payer: Medicare Other

## 2011-01-16 ENCOUNTER — Encounter (HOSPITAL_COMMUNITY): Payer: Medicare Other

## 2011-01-18 ENCOUNTER — Encounter (HOSPITAL_COMMUNITY): Payer: Medicare Other

## 2011-01-18 NOTE — Op Note (Signed)
NAMEJAYREN, CEASE NO.:  [id number]   MEDICAL RECORD NO.:  [id number]          PATIENT TYPE:  INP   LOCATION:  0098                         FACILITY:  [facility]   PHYSICIAN:  Artist Pais. Weingold, M.D.DATE OF BIRTH:  [birth year]   DATE OF PROCEDURE:  03/17/2006  DATE OF DISCHARGE:                                 OPERATIVE REPORT   PREOPERATIVE DIAGNOSIS:  Complex injury, left fifth digit, radial border  proximal phalanx.   POSTOPERATIVE DIAGNOSIS:  Complex injury, left fifth digit, radial border  proximal phalanx.   OPERATION PERFORMED:  1.  Exploration of above with primary repair of zone 2 flexor digitorum and      profundus flexor tendon lacerations.  2.  Open reduction internal fixation, open grade 2 middle phalangeal      fracture with 0.035 K-wire.  3.  Repair of radial digital nerve.  4.  Repair of extensor tendon.   SURGEON:  Artist Pais. [name], M.D.   ASSISTANT:  None.   ANESTHESIA:  General.   TOURNIQUET TIME:  One hour and seven minutes.   COMPLICATIONS:  None.   DRAINS:  None.   DESCRIPTION OF PROCEDURE:  The patient was taken to the operating room.  After the induction of adequate general anesthesia, the left upper extremity  was prepped and draped in the usual sterile fashion.  An Esmarch was used to  exsanguinate the limb.  The tourniquet was inflated to 275 mmHg.  At this  point a small laceration over the volar aspect of the ring finger was  irrigated and loosely closed with three simple nylon 4-0 sutures.  The  little finger was approached.  There was a complex laceration along the  radial side of the proximal phalanx of the little finger.  This extended  proximally and distally in midlateral fashion.  Visualization revealed a  grade 2 open middle phalangeal fracture at the base of the PIP joint extra-  articular as well as complete lacerations of the superficialis and profundus  tendons, the radial neurovascular bundle and the  extensor tendon dorsally.  The wound was thoroughly irrigated and debrided of clot and nonviable  material.  The superficial and profunda superficialis and profundus tendons  were repaired.  The superficialis was repaired first with 4-0 Ethibond x 4  mattress sutures x 1 in each slip, followed by a 6-0 Prolene epitendinous  repair.  The profundus tendon was repaired using 6-0 epitendinous suture  first followed by two 3-0 Ethibond core sutures, including a horizontal  mattress in [place]ell type fashion.  After the tendon repairs were completed,  the radial digital nerve was repaired with 8-0 nylon.  This was then  followed by open pinning of the middle phalangeal fracture from distal to  proximal under direct and fluoroscopic guidance with 0.035 K-wire crossing  the proximal phalangeal joint in slight flexion.  The extensor mechanism was  then repaired using a 3-0 Ethibond suture x 2 horizontal mattress sutures  along the radial side.  The wound was then thoroughly irrigated again.  Hemostasis was achieved with bipolar cautery and all the wounds  were loosely  closed with 4-0  nylon.  A pin cap was placed over the 0.035 K-wire and the patient was then  dressed with Xeroform, 4 x 4s and fluffs and dorsal extension block splint.  The patient tolerated the procedures well and went to recovery room in  stable fashion.      Artist Pais [name], M.D.  Electronically Signed     MAW/MEDQ  D:  03/17/2006  T:  03/18/2006  Job:  [id number]

## 2011-01-18 NOTE — Consult Note (Signed)
NAMESLATER, MCMANAMAN NO.:  [id number]   MEDICAL RECORD NO.:  [id number]          PATIENT TYPE:  INP   LOCATION:  0098                         FACILITY:  [facility]   PHYSICIAN:  Artist Pais. [name], M.D.DATE OF BIRTH:  21-May-1924   DATE OF CONSULTATION:  03/17/2006  DATE OF DISCHARGE:                                   CONSULTATION   REFERRING PHYSICIAN:  Doug Sou, M.D.   REASON FOR CONSULTATION:  Mr. Batalla is a very pleasant 77 year old right-  handed male who was using a circular saw earlier today.  He presents today  with what appears to be lacerations of the lower aspect of his fourth and  fifth digits now down the left hand with probable tendon, artery or nerve  damage to the radial side of the fifth digit left hand.  He is 77 years old.   ALLERGIES:  He has no known drug allergies.   CURRENT MEDICATIONS:  No current medications except Lipitor, propranolol.   PAST MEDICAL HISTORY:  No recent hospitalizations or surgery.   FAMILY HISTORY:  Noncontributory.   SOCIAL HISTORY:  Noncontributory.  He does not smoke or drink.   PHYSICAL EXAMINATION:  GENERAL:  A well-developed, well-nourished male,  pleasant, alert and oriented x3.  EXTREMITIES:  He has lower lacerations over the PIP joint of his ring and  small finger.  The ring is superficial.  It has full tendon function.  No  tendon, artery or nerve damage on physical examination.  The little finger  on the left has lost some active profundus function and possible decrease  sensation in the radial digital nerve on his fifth digit with a complex  laceration.  The rest of his left hand exam is normal.  Right hand is normal  acting in comparison.   X-ray shows some small osseous fragments over the base of the middle phalanx  thoroughly from the saw blade.  This is an 77 year old male with open  fracture of the middle phalanx, probable tendon, artery, nerve damage on his  nondominant left little finger  status post circular saw injury earlier  today.  At this point and time, we explained to Mr. Mcroberts that the  recommendation would be to take him to the operating room for exploration  and repair as necessary.  We will go as soon as possible as an outpatient  for exploration and repair left fourth and fifth digits.      Artist Pais [name], M.D.  Electronically Signed     MAW/MEDQ  D:  03/17/2006  T:  03/17/2006  Job:  [id number]

## 2011-01-21 ENCOUNTER — Encounter (HOSPITAL_COMMUNITY): Payer: Medicare Other

## 2011-01-23 ENCOUNTER — Encounter (HOSPITAL_COMMUNITY): Payer: Medicare Other

## 2011-01-25 ENCOUNTER — Encounter (HOSPITAL_COMMUNITY): Payer: Medicare Other

## 2011-01-28 ENCOUNTER — Encounter (HOSPITAL_COMMUNITY): Payer: Medicare Other

## 2011-01-30 ENCOUNTER — Encounter (HOSPITAL_COMMUNITY): Payer: Medicare Other

## 2011-02-01 ENCOUNTER — Encounter (HOSPITAL_COMMUNITY): Payer: Medicare Other

## 2011-02-01 ENCOUNTER — Ambulatory Visit (HOSPITAL_COMMUNITY): Payer: Medicare Other | Attending: Internal Medicine

## 2011-02-01 DIAGNOSIS — M810 Age-related osteoporosis without current pathological fracture: Secondary | ICD-10-CM | POA: Insufficient documentation

## 2011-02-04 ENCOUNTER — Encounter (HOSPITAL_COMMUNITY): Payer: Medicare Other

## 2011-02-06 ENCOUNTER — Encounter (HOSPITAL_COMMUNITY): Payer: Medicare Other

## 2011-02-08 ENCOUNTER — Encounter (HOSPITAL_COMMUNITY): Payer: Medicare Other

## 2011-02-11 ENCOUNTER — Encounter (HOSPITAL_COMMUNITY): Payer: Medicare Other

## 2011-02-13 ENCOUNTER — Encounter (HOSPITAL_COMMUNITY): Payer: Medicare Other

## 2011-02-15 ENCOUNTER — Encounter (HOSPITAL_COMMUNITY): Payer: Medicare Other

## 2011-02-18 ENCOUNTER — Encounter (HOSPITAL_COMMUNITY): Payer: Medicare Other

## 2011-03-17 DIAGNOSIS — Z955 Presence of coronary angioplasty implant and graft: Secondary | ICD-10-CM | POA: Insufficient documentation

## 2011-09-10 IMAGING — CR DG CHEST 2V
2 series · 2 of 2 positions shown · non-contrast
Comparison: 10/20/2010

CLINICAL DATA: Coronary atherosclerosis, status post CABG

CHEST - 2 VIEW

[w chest pa]
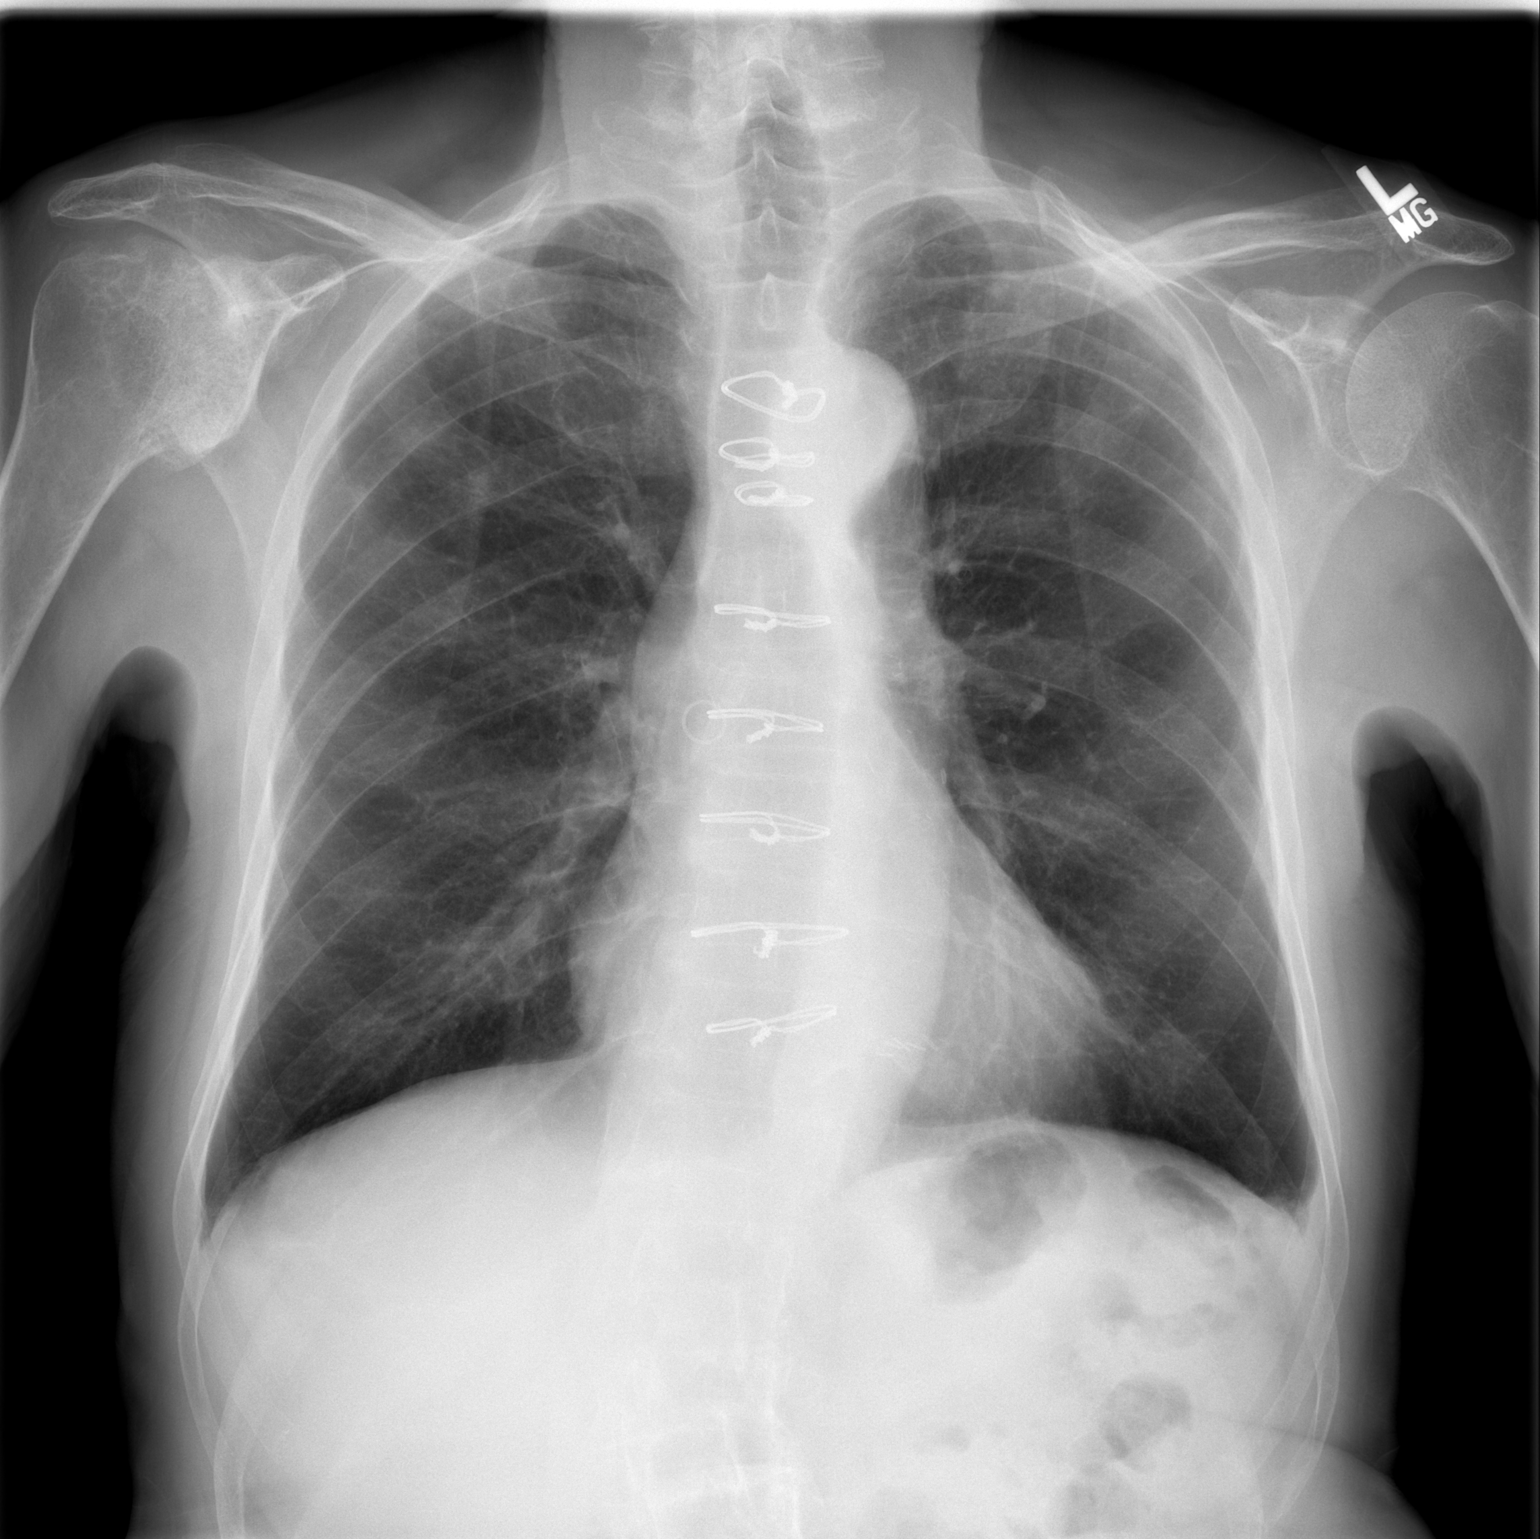

[w chest lat]
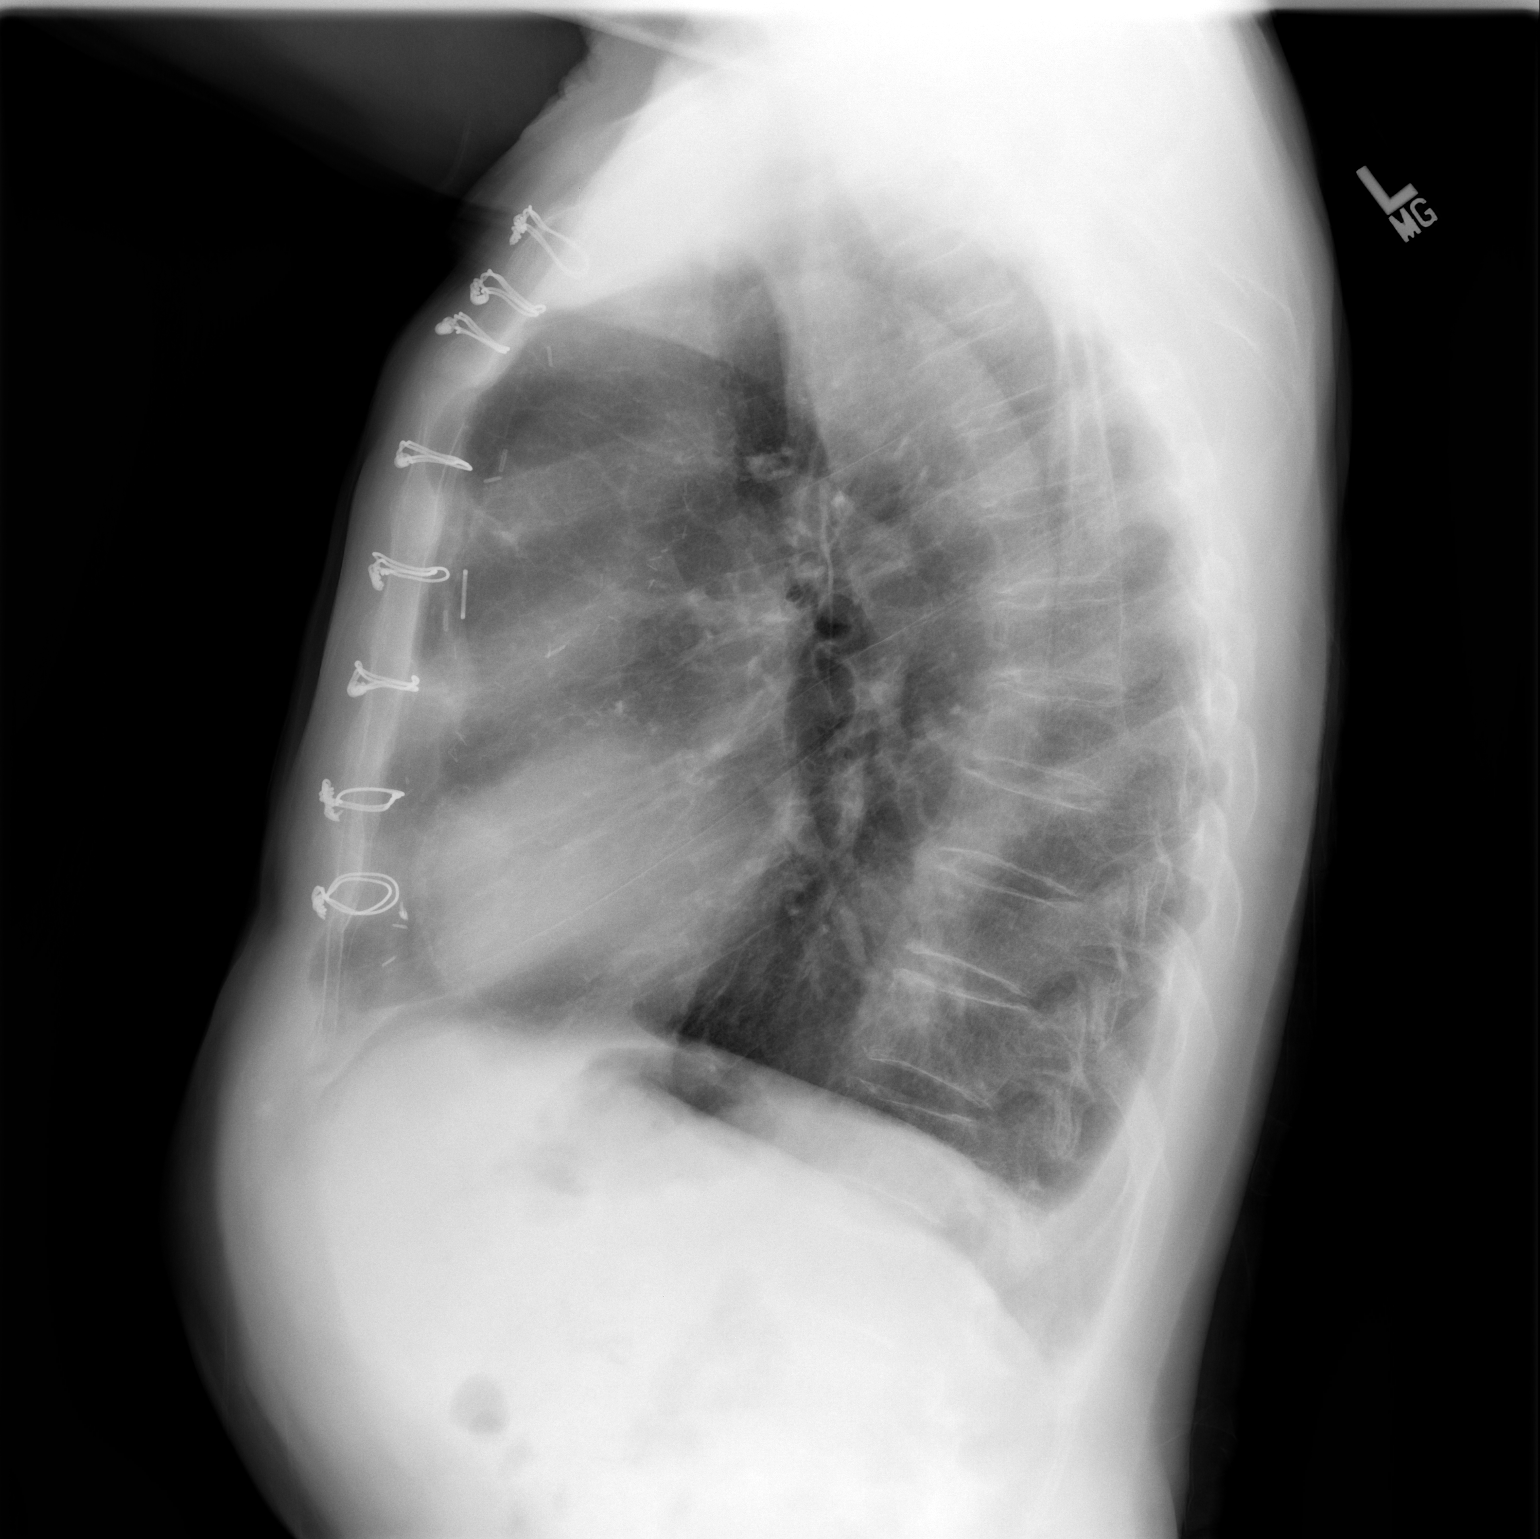

[2 of 2 positions shown; findings below may reference images not displayed]

FINDINGS: The lungs are grossly clear.  There is minimal left
pleural thickening/effusion.  No pneumothorax.

Cardiomediastinal silhouette is within normal limits. Postsurgical
changes related to prior CABG.

Degenerative changes of the visualized thoracolumbar spine and
right shoulder.
IMPRESSION: Minimal left pleural thickening/effusion. Clear lungs.

## 2012-01-20 ENCOUNTER — Other Ambulatory Visit: Payer: Self-pay | Admitting: Internal Medicine

## 2012-01-20 DIAGNOSIS — R748 Abnormal levels of other serum enzymes: Secondary | ICD-10-CM

## 2012-01-24 ENCOUNTER — Ambulatory Visit
Admission: RE | Admit: 2012-01-24 | Discharge: 2012-01-24 | Disposition: A | Discharge status: Home / Self Care | Payer: Medicare Other | Source: Ambulatory Visit | Attending: Internal Medicine | Admitting: Internal Medicine

## 2012-01-24 DIAGNOSIS — R748 Abnormal levels of other serum enzymes: Secondary | ICD-10-CM

## 2012-02-05 ENCOUNTER — Encounter (HOSPITAL_COMMUNITY): Payer: Medicare Other

## 2012-02-07 ENCOUNTER — Other Ambulatory Visit (HOSPITAL_COMMUNITY): Payer: Self-pay | Admitting: *Deleted

## 2012-02-11 ENCOUNTER — Encounter (HOSPITAL_COMMUNITY)
Admission: RE | Admit: 2012-02-11 | Discharge: 2012-02-11 | Disposition: A | Discharge status: Home / Self Care | Payer: Medicare Other | Source: Ambulatory Visit | Attending: Internal Medicine | Admitting: Internal Medicine

## 2012-02-11 ENCOUNTER — Encounter (HOSPITAL_COMMUNITY): Payer: Self-pay

## 2012-02-11 DIAGNOSIS — M810 Age-related osteoporosis without current pathological fracture: Secondary | ICD-10-CM | POA: Insufficient documentation

## 2012-02-11 HISTORY — DX: Age-related osteoporosis without current pathological fracture: M81.0

## 2012-02-11 HISTORY — DX: Tremor, unspecified: R25.1

## 2012-02-11 MED ORDER — ZOLEDRONIC ACID 5 MG/100ML IV SOLN
5.0000 mg | Freq: Once | INTRAVENOUS | Status: AC
  Administered 2012-02-11: 5 mg via INTRAVENOUS
  Filled 2012-02-11: qty 100

## 2012-02-11 MED ORDER — SODIUM CHLORIDE 0.9 % IV SOLN
Freq: Once | INTRAVENOUS | Status: AC
  Administered 2012-02-11: 250 mL via INTRAVENOUS

## 2012-02-11 NOTE — Discharge Instructions (Signed)
Drink fluids/water as tolerated over next 72hrs Tylenol or Ibuprofen OTC as directed Continue calcium and Vit D as directed by your MD 

## 2012-09-18 ENCOUNTER — Ambulatory Visit (INDEPENDENT_AMBULATORY_CARE_PROVIDER_SITE_OTHER): Payer: Medicare Other | Admitting: Family Medicine

## 2012-09-18 VITALS — BP 162/95 | HR 62 | Temp 98.7°F | Resp 18 | Wt 156.0 lb

## 2012-09-18 DIAGNOSIS — S61209A Unspecified open wound of unspecified finger without damage to nail, initial encounter: Secondary | ICD-10-CM

## 2012-09-18 DIAGNOSIS — S61219A Laceration without foreign body of unspecified finger without damage to nail, initial encounter: Secondary | ICD-10-CM

## 2012-09-18 DIAGNOSIS — M79646 Pain in unspecified finger(s): Secondary | ICD-10-CM

## 2012-09-18 DIAGNOSIS — M79609 Pain in unspecified limb: Secondary | ICD-10-CM

## 2012-09-18 NOTE — Progress Notes (Signed)
Patient ID: MYLAN SCHWARZ MRN: [id number], DOB: 03/31/1924, 79 y.o. Date of Encounter: 09/18/2012, 5:31 PM   PROCEDURE NOTE: Verbal consent obtained. Sterile technique employed. Numbing: Anesthesia obtained with 2% lidocaine plain.  Cleansed with soap and water. Irrigated.  Wound explored, no deep structures involved, no foreign bodies.   Wound repaired with # 5 SI and #1 HM sutures.  Hemostasis obtained. Wound cleansed and dressed.  Wound care instructions including precautions covered with patient. Handout given.  Anticipate suture removal in 7-10 days  Rhoderick Moody, PA-C 09/18/2012 5:31 PM

## 2012-09-18 NOTE — Patient Instructions (Signed)

## 2012-09-18 NOTE — Progress Notes (Signed)
[facility] [address] [place] [address] [phone]  Date:  09/18/2012   Name:  Tanner [name]   DOB:  [birth year]   MRN:  [id number]  PCP:  No primary provider on file.    Chief Complaint: finger laceration   History of Present Illness:  Tanner [name] is a 79 y.o. very pleasant male patient who presents with the following:  He cut his left long finger on an elevator door a little while ago.  The door was shutting and he put his hand out to catch it- something sharp must have cut his finger.  The finger was NOT crushed in the door.   He is right handed.   His last tetanus shot was about 2 year ago.   He does not feel that the finger could be broken- it is not painful except for the cut  There is no problem list on file for this patient.   Past Medical History  Diagnosis Date  . Osteoporosis   . Hypercholesteremia   . Tremors of nervous system     hands    No past surgical history on file.  History  Substance Use Topics  . Smoking status: Former Games developer  . Smokeless tobacco: Not on file  . Alcohol Use: Not on file    No family history on file.  No Known Allergies  Medication list has been reviewed and updated.  Current Outpatient Prescriptions on File Prior to Visit  Medication Sig Dispense Refill  . aspirin 81 MG tablet Take 81 mg by mouth daily.      [name] Kitchen atorvastatin (LIPITOR) 80 MG tablet Take 80 mg by mouth daily.      . calcium-vitamin D (OSCAL WITH D) 500-200 MG-UNIT per tablet Take 1 tablet by mouth 2 (two) times daily.      . fish oil-omega-3 fatty acids 1000 MG capsule Take 2 g by mouth daily.      . Multiple Vitamin (MULTIVITAMIN) capsule Take 1 capsule by mouth daily.      . naproxen sodium (ANAPROX) 220 MG tablet Take 220 mg by mouth as needed.      . propranolol (INDERAL) 20 MG tablet Take 20 mg by mouth daily.        Review of Systems:  As per HPI- otherwise negative.   Physical Examination: Filed Vitals:   09/18/12 1623  BP: 175/85  Pulse: 62  Temp: 98.7 F (37.1 C)  Resp: 18   Filed Vitals:   09/18/12 1623  Weight: 156 lb (70.761 kg)   There is no height on file to calculate BMI. Ideal Body Weight:     GEN: WDWN, NAD, Non-toxic, Alert & Oriented x 3 HEENT: Atraumatic, Normocephalic.  Ears and Nose: No external deformity. EXTR: No clubbing/cyanosis/edema NEURO: Normal gait.  PSYCH: Normally interactive. Conversant. Not depressed or anxious appearing.  Calm demeanor.  Left hand: the palmar side of the long finger shows a laceration over the proximal phalanx.  He has full strength and normal sensation of the finger.  The laceration does not seem to be deep.    Assessment and Plan: 1. Pain in finger   2. Laceration of finger    Laceration repaired as per note by Ms. Marte.  Discussed BP with Mr. Hersh- he will see his pcp in a couple of weeks.  He reports that he checks his BP frequently and is is usually well controlled  Jaidan Stachnik, MD

## 2012-09-28 ENCOUNTER — Ambulatory Visit (INDEPENDENT_AMBULATORY_CARE_PROVIDER_SITE_OTHER): Payer: Medicare Other | Admitting: Physician Assistant

## 2012-09-28 VITALS — BP 129/74 | HR 58 | Temp 98.0°F | Resp 17 | Ht 70.75 in | Wt 158.0 lb

## 2012-09-28 DIAGNOSIS — M79609 Pain in unspecified limb: Secondary | ICD-10-CM

## 2012-09-28 DIAGNOSIS — S61209A Unspecified open wound of unspecified finger without damage to nail, initial encounter: Secondary | ICD-10-CM

## 2012-09-28 NOTE — Progress Notes (Signed)
  Subjective:    Patient ID: Tanner [name], male    DOB: 1923-11-23, 79 y.o.   MRN: [id number]  HPI 80 year old male presents for suture removal. DOI 09/17/12 to the left 3rd digit.  Doing well. No erythema, warmth, or drainage.  Admits that the stiches did "pop out" 3 days ago.  He has been using his hands regularly and not "resting it." has been trying to keep it covered.      Review of Systems  Constitutional: Negative for fever and chills.  Musculoskeletal: Negative for joint swelling.  Skin: Positive for wound. Negative for color change.  All other systems reviewed and are negative.       Objective:   Physical Exam  Constitutional: He is oriented to person, place, and time. He appears well-developed and well-nourished.  HENT:  Head: Normocephalic and atraumatic.  Right Ear: External ear normal.  Left Ear: External ear normal.  Eyes: Conjunctivae normal are normal.  Neck: Normal range of motion.  Pulmonary/Chest: Effort normal.  Neurological: He is alert and oriented to person, place, and time.  Skin:       Sutures not in place.  There were 3 remaining but wound open in the center but is well healing.  No erythema, warmth, or drainage. The 3 remaining sutures were removed.   Psychiatric: He has a normal mood and affect. His behavior is normal. Judgment and thought content normal.          Assessment & Plan:   1. Wound, open, finger   Wound well-healing. No evidence of infection Keep covered with bandaid until completely closed Follow up as needed.

## 2013-02-07 ENCOUNTER — Encounter: Payer: Self-pay | Admitting: Cardiology

## 2013-02-07 DIAGNOSIS — I4891 Unspecified atrial fibrillation: Secondary | ICD-10-CM | POA: Insufficient documentation

## 2013-02-08 ENCOUNTER — Ambulatory Visit (INDEPENDENT_AMBULATORY_CARE_PROVIDER_SITE_OTHER): Payer: Medicare Other | Admitting: Cardiology

## 2013-02-08 ENCOUNTER — Encounter: Payer: Self-pay | Admitting: Cardiology

## 2013-02-08 VITALS — BP 126/74 | HR 55 | Ht 70.75 in | Wt 157.0 lb

## 2013-02-08 DIAGNOSIS — E7800 Pure hypercholesterolemia, unspecified: Secondary | ICD-10-CM

## 2013-02-08 DIAGNOSIS — I351 Nonrheumatic aortic (valve) insufficiency: Secondary | ICD-10-CM

## 2013-02-08 DIAGNOSIS — I4891 Unspecified atrial fibrillation: Secondary | ICD-10-CM

## 2013-02-08 DIAGNOSIS — I359 Nonrheumatic aortic valve disorder, unspecified: Secondary | ICD-10-CM

## 2013-02-08 DIAGNOSIS — I2510 Atherosclerotic heart disease of native coronary artery without angina pectoris: Secondary | ICD-10-CM

## 2013-02-08 NOTE — Assessment & Plan Note (Signed)
He is on a statin, the dose was increased to 80 mg and it may very well be the reason lightheadedness right leg symptoms. He could probably consider using the addition of Zetia to his prior dose of 40 mg of Lipitor, but I will defer this to primary physician. He intends to discuss with primary physician.

## 2013-02-08 NOTE — Patient Instructions (Addendum)
You are doing great as usual.  I agree, that you should talk to your primary doctor about backing down on Lipitor to 40mg  & consider simply adding a medicine called Zetia that works in the gut.  I would like to check an echocardiogram Passage of not had one done since her operation. This was given as a new baseline for your heart function. Your physician has requested that you have an echocardiogram. Echocardiography is a painless test that uses sound waves to create images of your heart. It provides your doctor with information about the size and shape of your heart and how well your heart's chambers and valves are working. This procedure takes approximately one hour. There are no restrictions for this procedure. Your physician recommends that you schedule a follow-up appointment in 12 months  Shonita Rinck W, MD

## 2013-02-08 NOTE — Assessment & Plan Note (Signed)
This be followed up with his routine echocardiogram as ordered for CAD.

## 2013-02-08 NOTE — Assessment & Plan Note (Signed)
No recurrent episodes, not anticoagulated because is not any further episodes. No acute ischemia further problem for now.

## 2013-02-08 NOTE — Progress Notes (Signed)
Patient ID: Tanner [name], male   DOB: 1924/01/31, 79 y.o.   MRN: [id number]  Clinic Note: HPI: Tanner [name] is a 79 y.o. male with a PMH below who presents today for routine cardiac evaluation. He underwent 2 vessel CABG in November 2012 with a LIMA-LAD history of the PDA. The patient Dr. Donavan Burnet. 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 He also is very active with exercising relatively routinely.  Interval History: He comes in today with no major complaints at all and besides affect is a little of neuropathy symptoms in his right leg. He thinks it started shortly after this his Lipitor dose was increased to 80 mg a day. Other than that really denies any chest pain or shortness of breath with exertion. No PND, orthopnea, or edema. No lightheadedness, dizziness, wooziness or syncope/near syncope type symptoms. No TIA or amaurosis fugax symptoms. No palpitations or any sensation of recurrence of his atrial fibrillation he had post CABG.  He denies any melena, hematochezia, hematuria. No nosebleeds.   Past Medical History  Diagnosis Date  . Osteoporosis   . Hypercholesteremia   . Tremors of nervous system     hands  . CAD (coronary artery disease), native coronary artery 1991; 2012    PCI - LAD; Cath in 2012 revealed severe RCA & LAD Diseaes -- CABG x 2  . AF (atrial fibrillation) -- Post Operative     Prior Cardiac Evaluation and Past Surgical History: Past Surgical History  Procedure Laterality Date  . Coronary artery bypass  graft  10/2010    LIMA-LAD, SVG-rPDA    No Known Allergies  Current Outpatient Prescriptions  Medication Sig Dispense Refill  . aspirin 325 MG EC tablet Take 325 mg by mouth daily.      [name] Kitchen atorvastatin (LIPITOR) 80 MG tablet Take 80 mg by mouth daily.      . calcium-vitamin D (OSCAL WITH D) 500-200 MG-UNIT per tablet Take 1 tablet by mouth 2 (two) times daily.      . fish oil-omega-3 fatty acids 1000 MG capsule Take 2 g by mouth daily.      . metoprolol tartrate (LOPRESSOR) 25 MG tablet Take 12.5 mg by mouth 2 (two) times daily.      . Multiple Vitamin (MULTIVITAMIN) capsule Take 1 capsule by mouth daily.      . naproxen sodium (ANAPROX) 220 MG tablet Take 220 mg by mouth as needed.       No current facility-administered medications for this visit.    History   Social History  . Marital Status: Married    Spouse Name: N/A    Number of Children: N/A  . Years of Education: N/A   Occupational History  . Not on file.   Social History Main Topics  . Smoking status: Former Smoker    Quit date: 02/08/1938  . Smokeless tobacco: Not on file  . Alcohol Use: No  . Drug Use: Not on file  . Sexually Active: Not on file   Other Topics Concern  . Not on  file   Social History Narrative  . No narrative on file   ROS: A comprehensive Review of Systems - Negative except Pertinent positives noted above. Most notably major complaint today of the right leg "nephropathy brachials and his right leg is half asleep. He thinks this has to do with his increased dose of Lipitor.  PHYSICAL EXAM BP 126/74  Pulse 55  Ht 5' 10.75" (1.797 m)  Wt 157 lb (71.215 kg)  BMI 22.05 kg/m2 General appearance: alert, cooperative, appears stated age, no distress and Healthy-appearing, pleasant mood and affect. Well-nourished and well-groomed. Neck: no adenopathy, no carotid bruit, no JVD, supple, symmetrical, trachea midline and thyroid not enlarged, symmetric, no tenderness/mass/nodules Lungs: clear to  auscultation bilaterally, normal percussion bilaterally and Nonlabored Heart: regular rate and rhythm, S1, S2 normal, no murmur, click, rub or gallop, prominent apical impulse and Mildly bradycardic. The apical impulse is just to the left of midclavicular. It is nonsustained. It's probably just because of his thin body habitus. Abdomen: soft, non-tender; bowel sounds normal; no masses,  no organomegaly and Thin, scaphoid Extremities: extremities normal, atraumatic, no cyanosis or edema, no edema, redness or tenderness in the calves or thighs and no ulcers, gangrene or trophic changes Pulses: 2+ and symmetric Neurologic: Alert and oriented X 3, normal strength and tone. Normal symmetric reflexes. Normal coordination and gait Mild reduced sensation in the right leg. HEENT: [place]/achy, MMM, anicteric sclera. EOMI.  [id number] today: Yes Rate: 55 , Rhythm: Sinus bradycardia with sinus arrhythmia and first-degree AV block. RSR prime, voltage consistent with LVH. No significant change previous ECGs  Recent Labs:  ASSESSMENT: Very stable from a cardiac standpoint.  AF (atrial fibrillation) - Plan: EKG 12-Lead  CAD (coronary artery disease), native coronary artery - Plan: EKG 12-Lead  Hypercholesteremia  Mild aortic insufficiency - Plan: 2D Echocardiogram without contrast  PLAN: Per problem list. Orders Placed This Encounter  Procedures  . EKG 12-Lead  . 2D Echocardiogram without contrast    Standing Status: Future     Number of Occurrences:      Standing Expiration Date: 02/08/2014    Order Specific Question:  Type of Echo    Answer:  Complete    Order Specific Question:  Where should this test be performed    Answer:  [facility]    Order Specific Question:  Reason for exam-Echo    Answer:  Aortic Valve Disorder 424.1    Order Specific Question:  Reason for exam-Echo    Answer:  CAD Native Vessel  414.01   Followup: 1 year  HARDING,DAVID W, M.D., M.S. [facility] [address] [place], [place]  [address]  [phone] Pager # [phone] 02/08/2013 10:41 AM

## 2013-02-08 NOTE — Assessment & Plan Note (Addendum)
Doing well and no active symptoms. On beta blocker., Statin, and aspirin. No active symptoms in a very active patient. No need to evaluate with a stress test currently. But we would want to check an echocardiogram to get a better assessment of his ejection fraction postoperatively. He will be due for a followup nuclear stress test probably when I see him again to order for 4 years out postop, but they somehow level activity at that is anything there.  His blood pressure and heart rate are well-controlled on low-dose beta blocker only.

## 2013-02-11 ENCOUNTER — Encounter: Payer: Self-pay | Admitting: *Deleted

## 2013-02-13 ENCOUNTER — Encounter: Payer: Self-pay | Admitting: Cardiology

## 2013-03-11 ENCOUNTER — Ambulatory Visit (INDEPENDENT_AMBULATORY_CARE_PROVIDER_SITE_OTHER): Payer: Medicare Other | Admitting: Neurology

## 2013-03-11 ENCOUNTER — Encounter: Payer: Self-pay | Admitting: Neurology

## 2013-03-11 VITALS — BP 116/66 | HR 57 | Temp 98.1°F | Ht 71.0 in | Wt 157.0 lb

## 2013-03-11 DIAGNOSIS — G629 Polyneuropathy, unspecified: Secondary | ICD-10-CM

## 2013-03-11 DIAGNOSIS — G589 Mononeuropathy, unspecified: Secondary | ICD-10-CM

## 2013-03-11 NOTE — Progress Notes (Signed)
Subjective:    Patient ID: Tanner [name] is a 79 y.o. male.  HPI  Tanner Foley, MD, PhD [facility] [address] [address] [place], [place] [address]  Dear Dr. Thea Silversmith,   I saw your patient, Tanner [name], upon your kind request in my neurologic clinic today for initial consultation of his neuropathy. The patient is unaccompanied today. As you know, Tanner [name] is a very pleasant 79 year old right-handed gentleman with an underlying medical history of heart disease, s/p angioplasty in 1991 and then 2 vessel CABG in 2012, hyperlipidemia, osteoporosis, osteoarthritis and BPH, who has been experiencing worsening numbness in his lower extremities for the past year. He is currently on adult size aspirin daily, Aleve as needed, fish oil, multivitamin, Reclast once a year, calcium, atorvastatin 80 mg, which was increased from 20 mg to 40 mg to 80 mg over the course of the last 18 month, metoprolol. 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 He and his wife live at [facility]; she is a 77 yo triplet! He has been married 67 years and they have no children.  He reports no tingling, no burning, no pain, no tenderness, but occasional cramping. His balance is not as good, especially with his eyes closed. He describes no claudication.  His Past Medical History Is Significant For: Past Medical History  Diagnosis Date  . Osteoporosis   . Hypercholesteremia   . Tremors of nervous system     hands  . CAD (coronary artery disease), native coronary artery 1991; 2012    PCI - LAD; Cath in 2012 revealed severe RCA & LAD Diseaes -- CABG x 2  . AF (atrial fibrillation) -- Post Operative     His Past Surgical History Is Significant For: Past Surgical History  Procedure Laterality Date  . Coronary artery bypass graft  10/2010    LIMA-LAD, SVG-rPDA   . Doppler echocardiography  10/18/2004    EF 55-65%,BORDERLINE -enlarged aortic root,mild aortic insuff.,trace tricus.insuff.  . Nm myocar perf wall motion  07/24/1998    EF 71%  . Cardiac catheterization  10/17/2010    80%prox w/mild in-stent restenosis w/75% mid-LADstenosis aft the stent,multi severe stenoses RCAw/heavily calcified vessel,norm LV    His Family History Is Significant For: Family History  Problem Relation Age of Onset  . Heart Problems Mother   . Parkinson's disease Father   . Heart Problems Father     His Social History Is Significant For: History   Social History  . Marital Status: Married    Spouse Name: Corrie Dandy    Number of Children: 0  . Years of Education: BS, MS   Occupational History  . Retired    Social History Main Topics  . Smoking status: Former Smoker -- 1.00 packs/day for 5 years    Types: Cigarettes    Quit date: 02/08/1938  . Smokeless tobacco: Former Neurosurgeon  . Alcohol Use: No  . Drug Use: No  . Sexually Active: None   Other Topics Concern  . None   Social History Narrative   Pt lives at home with spouse.   Caffeine Use: very little    His Allergies Are:  No Known Allergies:   His Current Medications Are:  Outpatient Encounter Prescriptions as of 03/11/2013  Medication Sig Dispense Refill  . aspirin 325 MG EC tablet Take 325 mg  by mouth daily.      [name] Kitchen atorvastatin (LIPITOR) 80 MG tablet Take 80 mg by mouth daily.      . calcium-vitamin D (OSCAL WITH D) 500-200 MG-UNIT per tablet Take 1 tablet by mouth 2 (two) times daily.      . fish oil-omega-3 fatty acids 1000 MG capsule Take 2 g by mouth daily.      . metoprolol tartrate (LOPRESSOR) 25 MG tablet Take 12.5 mg by mouth 2 (two) times daily.      . Multiple Vitamin (MULTIVITAMIN) capsule Take 1 capsule by mouth daily.      . naproxen sodium (ANAPROX) 220 MG tablet Take 220 mg by mouth as needed.       No facility-administered encounter medications on file as of 03/11/2013.    Review of Systems  Neurological: Positive for numbness.    Objective:  Neurologic Exam  Physical Exam Physical Examination:   Filed Vitals:   03/11/13 0822  BP: 116/66  Pulse: 57  Temp: 98.1 F (36.7 C)    General Examination: The patient is a very pleasant 79 y.o. male in no acute distress. He appears well-developed and well-nourished and well groomed.   HEENT: Normocephalic, atraumatic, pupils are equal, round and reactive to light and accommodation. Funduscopic exam is normal with sharp disc margins noted. Extraocular tracking is good without limitation to gaze excursion or nystagmus noted. Normal smooth pursuit is noted. Hearing is grossly intact. Tympanic membranes are clear bilaterally. Face is symmetric with normal facial animation and normal facial sensation. Speech is clear with no dysarthria noted. There is no hypophonia. There is no lip, neck/head, jaw or voice tremor. Neck is supple with full range of passive and active motion. There are no carotid bruits on auscultation. Oropharynx exam reveals: mild mouth dryness, adequate dental hygiene and mild airway crowding, due to redundant soft palate. Mallampati is class II. Tongue protrudes centrally and palate elevates symmetrically.   Chest: Clear to auscultation without wheezing, rhonchi or crackles noted.  Heart: S1+S2+0, regular and normal without murmurs, rubs or gallops noted.   Abdomen: Soft, non-tender and non-distended with normal bowel sounds appreciated on auscultation.  Extremities: There is trace pitting edema around both ankles. He was wearing long knee-high socks and his feet are slightly pale and colder. His pedal pulses are faintly palpable. t.  Skin: Warm and dry without trophic changes noted. There are no varicose veins, but he does have some spider veins.  Musculoskeletal: exam reveals no obvious joint deformities, tenderness or joint swelling or erythema.   Neurologically:  Mental status: The patient  is awake, alert and oriented in all 4 spheres. His memory, attention, language and knowledge are appropriate. There is no aphasia, agnosia, apraxia or anomia. Speech is clear with normal prosody and enunciation. Thought process is linear. Mood is congruent and affect is normal. He is very pleasant and conversant. Cranial nerves are as described above under HEENT exam. In addition, shoulder shrug is normal with equal shoulder height noted. Motor exam: Normal bulk, strength and tone is noted. There is no drift, tremor or rebound. Romberg is positive. Reflexes are 1+ in the upper extremities, 1+ in both knees and absent in both ankles. Toes are downgoing bilaterally. Fine motor skills are intact with normal finger taps, normal hand movements, normal rapid alternating patting, normal foot taps and normal foot agility.  Cerebellar testing shows no dysmetria or intention tremor on finger to nose testing. Heel to shin is unremarkable bilaterally. There is no truncal  ataxia.  Sensory exam is intact to light touch, pinprick, vibration, temperature sense and proprioception in the upper extremities, but does reveal decrease in pinprick sensation, temperature and vibration in the distal lower chimneys bilaterally. On the right side it is up to mid shin area and on the left it is a hand width above his ankle.  Gait, station and balance: He stands up with no difficulty but his stance is mildly wide-based. He walks very mildly cautiously and mildly wide-based. He turns well. Tandem walk is really not possible without assistance.   Assessment and Plan:   Assessment and Plan:  In summary, Tanner [name] is a very pleasant 79 y.o.-year old male with a history and physical exam c/w mild neuropathy with mild sensory ataxia noted. He has an otherwise fairly non-focal exam and I reassured the patient in that regard.  I had a long chat with the patient about my findings and the diagnosis of PN, the prognosis and treatment  options. We talked about medical treatments and non-pharmacological approaches. We talked about trying to maintain a healthy lifestyle in general. I encouraged the patient to eat healthy, exercise daily and keep well hydrated, to keep a scheduled bedtime and wake time routine, to not skip any meals and eat healthy snacks in between meals and to have protein with every meal. I also warned him about getting overheated. He admitted that a couple weeks ago he became dehydrated and it took him more than a week to recover from that he felt. He is advised to wear a hat or a cap for sun protection and  is advised against climbing ladders or be at heights. He does endorse having problems with his balance recently. At this juncture I explained to him that there is really no medication that would help his problem. He to utilize symptomatic treatment for painful neuropathy his but he really does not endorse any discomfort or pain. Of note he denies any RLS features. I would like to proceed with nerve conduction velocity testing. I would also like to do some blood work. He is advised to followup with me in about 3 months. We will be calling him with his test results as they come in. He was in agreement.   Thank you very much for allowing me to participate in the care of this very nice patient; he truly is delightful! If I can be of any further assistance to you please do not hesitate to call me at [phone].  Sincerely,   Tanner Foley, MD, PhD

## 2013-03-11 NOTE — Patient Instructions (Addendum)
I do want to suggest a few things today:  Remember to drink plenty of fluid, eat healthy meals and do not skip any meals. Try to eat protein with a every meal and eat a healthy snack such as fruit or nuts in between meals. Try to keep a regular sleep-wake schedule and try to exercise daily, particularly in the form of walking, 20-30 minutes a day, if you can.   Avoid climbing on ladders or be at height, stay out of the heat and wear a protective hat and keep well hydrated. Try to cut back on your schedule.   As far as your medications are concerned, I would like to suggest no new medication   As far as diagnostic testing: NCV testing.  I would like to see you back in 3 months, sooner if we need to. Please call us with any interim questions, concerns, problems, updates or refill requests.  Please also call us for any test results so we can go over those with you on the phone. Brett Canales is my clinical assistant and will answer any of your questions and relay your messages to me and also relay most of my messages to you.  Our phone number is [phone]. We also have an after hours call service for urgent matters and there is a physician on-call for urgent questions. For any emergencies you know to call 911 or go to the nearest emergency room.

## 2013-03-15 ENCOUNTER — Telehealth: Payer: Self-pay | Admitting: *Deleted

## 2013-03-15 ENCOUNTER — Ambulatory Visit (HOSPITAL_COMMUNITY)
Admission: RE | Admit: 2013-03-15 | Discharge: 2013-03-15 | Disposition: A | Discharge status: Home / Self Care | Payer: Medicare Other | Source: Ambulatory Visit | Attending: Cardiovascular Disease | Admitting: Cardiovascular Disease

## 2013-03-15 DIAGNOSIS — I4891 Unspecified atrial fibrillation: Secondary | ICD-10-CM

## 2013-03-15 DIAGNOSIS — I079 Rheumatic tricuspid valve disease, unspecified: Secondary | ICD-10-CM | POA: Insufficient documentation

## 2013-03-15 DIAGNOSIS — I359 Nonrheumatic aortic valve disorder, unspecified: Secondary | ICD-10-CM

## 2013-03-15 DIAGNOSIS — I351 Nonrheumatic aortic (valve) insufficiency: Secondary | ICD-10-CM

## 2013-03-15 DIAGNOSIS — I080 Rheumatic disorders of both mitral and aortic valves: Secondary | ICD-10-CM | POA: Insufficient documentation

## 2013-03-15 LAB — CK TOTAL AND CKMB (NOT AT ARMC)
CK-MB Index: 5.9 ng/mL (ref 0.0–5.0)
Total CK: 169 U/L (ref 24–204)

## 2013-03-15 LAB — COMPREHENSIVE METABOLIC PANEL
Albumin/Globulin Ratio: 1.5 (ref 1.1–2.5)
Albumin: 4.1 g/dL (ref 3.5–4.7)
Alkaline Phosphatase: 51 IU/L (ref 39–117)
BUN/Creatinine Ratio: 21 (ref 10–22)
BUN: 20 mg/dL (ref 8–27)
GFR calc Af Amer: 82 mL/min/{1.73_m2} (ref >59)
GFR calc non Af Amer: 71 mL/min/{1.73_m2} (ref >59)
Glucose: 78 mg/dL (ref 65–99)
Total Bilirubin: 1.1 mg/dL (ref 0.0–1.2)
Total Protein: 6.8 g/dL (ref 6.0–8.5)

## 2013-03-15 LAB — IFE AND PE, SERUM
Albumin/Glob SerPl: 1.4 (ref 0.7–2.0)
B-Globulin SerPl Elph-Mcnc: 0.9 g/dL (ref 0.6–1.3)
Gamma Glob SerPl Elph-Mcnc: 1.1 g/dL (ref 0.5–1.6)
Globulin, Total: 2.9 g/dL (ref 2.0–4.5)

## 2013-03-15 LAB — HTLV-I/II ANTIBODIES, QUAL.: HTLV I/II Ab: NEGATIVE

## 2013-03-15 LAB — C-REACTIVE PROTEIN: CRP: 0.4 mg/L (ref 0.0–4.9)

## 2013-03-15 LAB — ALDOLASE: Aldolase: 7.3 U/L (ref 1.2–7.6)

## 2013-03-15 LAB — VITAMIN E: Vitamin E (Alpha Tocopherol): 11 mg/L (ref 4.6–17.8)

## 2013-03-15 NOTE — Telephone Encounter (Signed)
Message copied by Tobin Chad on Mon Mar 15, 2013  5:55 PM ------      Message from: [place], DAVID      Created: Mon Mar 15, 2013  2:42 PM       Echo looks good!! No significant valve abnormality.            Normal function.  Just mild relaxation abnormality.            [name], MD       ------

## 2013-03-15 NOTE — Telephone Encounter (Signed)
Spoke to patient. Results given. Voiced understanding. 

## 2013-03-15 NOTE — Progress Notes (Signed)
2D Echo Performed 03/15/2013    Zerah Hilyer, RCS  

## 2013-03-16 DIAGNOSIS — E785 Hyperlipidemia, unspecified: Secondary | ICD-10-CM | POA: Insufficient documentation

## 2013-03-16 NOTE — Progress Notes (Signed)
Quick Note:  Please call patient back and advise him that the lab work we tested was for the most part normal. This includes electrolytes, kidney function, vitamin B12 level, thyroid function, vitamin D, ANA, and other inflammatory and autoimmune markers. The only thing that was slightly abnormal was his CK-MBs. This is a cardiac enzyme. This indicates sometimes cardiac damage. This is really only borderline elevated and unless he has chest pain or shortness or breath or cold sweats and any other chest discomfort it is probably just a laboratory finding that does not prompt any further testing. If he does report recent chest pain he needs to see his primary care physician or proceed to urgent care for further workup of cardiac pain. Huston Foley, MD, PhD [facility] ([facility])  ______

## 2013-03-23 NOTE — Progress Notes (Signed)
Quick Note:  I called pt and relayed the results of labs. CK/MB slightly abnormal. Will forward labs to Dr. Herbie Baltimore ([facility]). Other labs ok. He verbalized understanding. ______

## 2013-03-26 ENCOUNTER — Encounter (INDEPENDENT_AMBULATORY_CARE_PROVIDER_SITE_OTHER): Payer: Medicare Other

## 2013-03-26 ENCOUNTER — Ambulatory Visit (INDEPENDENT_AMBULATORY_CARE_PROVIDER_SITE_OTHER): Payer: Medicare Other | Admitting: Neurology

## 2013-03-26 DIAGNOSIS — Z0289 Encounter for other administrative examinations: Secondary | ICD-10-CM

## 2013-03-26 DIAGNOSIS — G63 Polyneuropathy in diseases classified elsewhere: Secondary | ICD-10-CM

## 2013-03-26 DIAGNOSIS — G629 Polyneuropathy, unspecified: Secondary | ICD-10-CM

## 2013-03-26 NOTE — Procedures (Signed)
  HISTORY:  Tanner [name] is an 79 year old gentleman with a 6-8 month history of numbness in the feet, some mild gait instability. The patient is being evaluated for a possible peripheral neuropathy.  NERVE CONDUCTION STUDIES:  Nerve conduction studies were performed on both lower extremities and on the right upper extremity. The distal motor latency for the right median nerve was normal, with a normal motor amplitude. The F wave latency for the right median nerve was borderline normal, with a normal nerve conduction velocity. The right median sensory latency was normal.  The distal motor latency for the right peroneal nerve was normal, with no response on the left. The motor amplitude for the right peroneal nerve was low. Slowing was seen for this nerve. The distal motor latencies for the posterior tibial nerves were prolonged on the right, normal on the left, with low motor amplitudes for these nerves bilaterally. The nerve conduction velocities for the posterior tibial nerves were normal on the right, and slowed on the left. The peroneal sensory latencies were absent bilaterally, and the H reflex latencies were absent bilaterally.  EMG STUDIES:  EMG study was performed on the right lower extremity:  The tibialis anterior muscle reveals 2 to 8K motor units with decreased recruitment. 2+ fibrillations and positive waves were seen. The peroneus tertius muscle reveals 2 to 6K motor units with moderately decreased recruitment. 2+ fibrillations and positive waves were seen. The medial gastrocnemius muscle reveals 1 to 5K motor units with decreased recruitment. 2+ fibrillations and positive waves were seen. The vastus lateralis muscle reveals 2 to 4K motor units with full recruitment. No fibrillations or positive waves were seen. The iliopsoas muscle reveals 2 to 4K motor units with full recruitment. No fibrillations or positive waves were seen. The biceps femoris muscle (long head) reveals 2 to 4K  motor units with full recruitment. No fibrillations or positive waves were seen. The lumbosacral paraspinal muscles were tested at 3 levels, and revealed 1+ fibrillations and positive waves at all 3 levels tested. There was good relaxation.   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 EMG evaluation of the right lower extremity shows distal denervation that is acute and chronic consistent with the diagnosis of peripheral neuropathy. There is mild acute denervation in the lumbosacral paraspinal muscles, and a mild overlying lumbosacral radiculopathy of indeterminate level is suspected. Clinical correlation is required.  Marlan Palau MD 03/26/2013 1:59 PM  [facility] [address] [place], [place] [address]  Phone [phone] Fax [phone]

## 2013-04-16 ENCOUNTER — Encounter: Payer: Self-pay | Admitting: Neurology

## 2013-06-21 ENCOUNTER — Ambulatory Visit (INDEPENDENT_AMBULATORY_CARE_PROVIDER_SITE_OTHER): Payer: Medicare Other | Admitting: Neurology

## 2013-06-21 ENCOUNTER — Encounter: Payer: Self-pay | Admitting: Neurology

## 2013-06-21 ENCOUNTER — Encounter (INDEPENDENT_AMBULATORY_CARE_PROVIDER_SITE_OTHER): Payer: Self-pay

## 2013-06-21 VITALS — BP 113/59 | HR 61 | Temp 98.1°F | Ht 71.0 in | Wt 156.0 lb

## 2013-06-21 DIAGNOSIS — G629 Polyneuropathy, unspecified: Secondary | ICD-10-CM

## 2013-06-21 DIAGNOSIS — R279 Unspecified lack of coordination: Secondary | ICD-10-CM

## 2013-06-21 DIAGNOSIS — Z951 Presence of aortocoronary bypass graft: Secondary | ICD-10-CM

## 2013-06-21 DIAGNOSIS — Z9861 Coronary angioplasty status: Secondary | ICD-10-CM

## 2013-06-21 DIAGNOSIS — R278 Other lack of coordination: Secondary | ICD-10-CM

## 2013-06-21 DIAGNOSIS — I4891 Unspecified atrial fibrillation: Secondary | ICD-10-CM

## 2013-06-21 DIAGNOSIS — G589 Mononeuropathy, unspecified: Secondary | ICD-10-CM

## 2013-06-21 NOTE — Progress Notes (Signed)
Subjective:    Patient ID: Tanner [name] is a 79 y.o. male.  HPI  Interim history:   Tanner [name] is a very pleasant 79 year old right-handed gentleman with an underlying medical history of heart disease, s/p angioplasty in 1991 and then 2 vessel CABG in 2012, hyperlipidemia, a fib, osteoporosis, osteoarthritis and BPH, who presents for followup consultation of his neuropathy and sensory ataxia. He is unaccompanied today. I first met him on 03/11/2013, which time I suggested further workup in the form of blood work and EMG and nerve conduction testing. He reported a one-year history of lower extremity numbness. He has no Hx of EtOH abuse. He helps build houses. He does not use a cane or walker. He denies back pain. He has had no exposures to toxins.  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 He and his wife live at [name]; she is a 77 yo triplet! He has been married 67 years and they have no children.  He reported no tingling, no burning, no pain, no tenderness, but occasional cramping. His balance is not as good, especially with his eyes closed. He describes no claudication. His blood work showed for the most part normal findings with the exception of a borderline elevated CK-MB. I tested ANA, ESR, B12, B1, vitamin D, RPR, SPEP, immunofluorescent electrophoresis him a TSH, CRP, CMP, all of which were normal. 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 EMG evaluation of the right lower extremity shows distal denervation that is acute and chronic consistent with the diagnosis of peripheral neuropathy. There is mild acute denervation in the lumbosacral paraspinal  muscles, and a mild overlying lumbosacral radiculopathy of indeterminate level is suspected. Clinical correlation is required.   His Past Medical History Is Significant For: Past Medical History  Diagnosis Date  . Osteoporosis   . Hypercholesteremia   . Tremors of nervous system     hands  . CAD (coronary artery disease), native coronary artery 1991; 2012    PCI - LAD; Cath in 2012 revealed severe RCA & LAD Diseaes -- CABG x 2  . AF (atrial fibrillation) -- Post Operative     His Past Surgical History Is Significant For: Past Surgical History  Procedure Laterality Date  . Coronary artery bypass graft  10/2010    LIMA-LAD, SVG-rPDA  . Doppler echocardiography  10/18/2004    EF 55-65%,BORDERLINE -enlarged aortic root,mild aortic insuff.,trace tricus.insuff.  . Nm myocar perf wall motion  07/24/1998    EF 71%  . Cardiac catheterization  10/17/2010    80%prox w/mild in-stent restenosis w/75% mid-LADstenosis aft the stent,multi severe stenoses RCAw/heavily calcified vessel,norm LV    His Family History Is Significant For: Family History  Problem Relation Age of Onset  . Heart Problems Mother   . Parkinson's disease Father   . Heart Problems Father     His Social History Is Significant For: History   Social History  . Marital Status: Married    Spouse Name: Tanner [name]    Number of Children: 0  . Years of Education: BS, MS   Occupational History  . Retired    Social History Main Topics  . Smoking status: Former Smoker -- 1.00 packs/day  for 5 years    Types: Cigarettes    Quit date: 02/08/1938  . Smokeless tobacco: Former Neurosurgeon  . Alcohol Use: No  . Drug Use: No  . Sexual Activity: None   Other Topics Concern  . None   Social History Narrative   Pt lives at home with spouse.   Caffeine Use: very little    His Allergies Are:  No Known Allergies:   His Current Medications Are:  Outpatient Encounter Prescriptions as of 06/21/2013  Medication Sig Dispense Refill  .  aspirin 325 MG EC tablet Take 325 mg by mouth daily.      [name] Kitchen atorvastatin (LIPITOR) 80 MG tablet Take 80 mg by mouth daily.      . calcium-vitamin D (OSCAL WITH D) 500-200 MG-UNIT per tablet Take 1 tablet by mouth 2 (two) times daily.      . Cyanocobalamin (VITAMIN B-12 PO) Take 1 tablet by mouth daily.      . fish oil-omega-3 fatty acids 1000 MG capsule Take 2 g by mouth daily.      . metoprolol tartrate (LOPRESSOR) 25 MG tablet Take 12.5 mg by mouth 2 (two) times daily.      . Multiple Vitamin (MULTIVITAMIN) capsule Take 1 capsule by mouth daily.      . naproxen sodium (ANAPROX) 220 MG tablet Take 220 mg by mouth as needed.       No facility-administered encounter medications on file as of 06/21/2013.  :  Review of Systems  Neurological: Positive for numbness (Bilateral lower legs).    Objective:  Neurologic Exam  Physical Exam Physical Examination:   Filed Vitals:   06/21/13 1131  BP: 113/59  Pulse: 61  Temp: 98.1 F (36.7 C)   General Examination: The patient is a very pleasant 79 y.o. male in no acute distress. He appears well-developed and well-nourished and well groomed.   HEENT: Normocephalic, atraumatic, pupils are equal, round and reactive to light and accommodation. Funduscopic exam is normal with sharp disc margins noted. Extraocular tracking is good without limitation to gaze excursion or nystagmus noted. Normal smooth pursuit is noted. Hearing is grossly intact. Tympanic membranes are clear bilaterally. Face is symmetric with normal facial animation and normal facial sensation. Speech is clear with no dysarthria noted. There is no hypophonia. There is no lip, neck/head, jaw or voice tremor. Neck is supple with full range of passive and active motion. There are no carotid bruits on auscultation. Oropharynx exam reveals: mild mouth dryness, adequate dental hygiene and mild airway crowding, due to redundant soft palate. Mallampati is class II. Tongue protrudes centrally and  palate elevates symmetrically.   Chest: Clear to auscultation without wheezing, rhonchi or crackles noted.  Heart: S1+S2+0, irregular without murmurs, rubs or gallops noted.   Abdomen: Soft, non-tender and non-distended with normal bowel sounds appreciated on auscultation.  Extremities: There is trace pitting edema around both ankles. He was wearing long knee-high socks and his feet are slightly pale and colder. His pedal pulses are faintly palpable.   Skin: Warm and dry without trophic changes noted. There are no varicose veins, but he does have some spider veins.  Musculoskeletal: exam reveals no obvious joint deformities, tenderness or joint swelling or erythema.   Neurologically:  Mental status: The patient is awake, alert and oriented in all 4 spheres. His memory, attention, language and knowledge are appropriate. There is no aphasia, agnosia, apraxia or anomia. Speech is clear with normal prosody and enunciation. Thought process is linear. Mood is  congruent and affect is normal. He is very pleasant and conversant. Cranial nerves are as described above under HEENT exam. In addition, shoulder shrug is normal with equal shoulder height noted. Motor exam: Normal bulk, strength and tone is noted. There is no drift, tremor or rebound. Romberg is positive. Reflexes are 1+ in the upper extremities, 1+ in both knees and absent in both ankles. Fine motor skills are intact with normal finger taps, normal hand movements, normal rapid alternating patting, normal foot taps and normal foot agility.  Cerebellar testing shows no dysmetria or intention tremor on finger to nose testing. Heel to shin is unremarkable bilaterally. There is no truncal ataxia.  Sensory exam is intact to light touch, pinprick, vibration, temperature sense and proprioception in the upper extremities, but does reveal decrease in pinprick sensation, temperature and vibration in the distal lower extremities. On the right side it is up  to mid shin area and on the left it is a hand width above his ankle.  Gait, station and balance: He stands up with no difficulty but his stance is mildly wide-based. He walks cautiously and mildly wide-based. He turns well. Tandem walk is not possible.   Assessment and Plan:     In summary, JOSHIA KITCHINGS is a very pleasant 79 year old male with a history and physical exam c/w neuropathy with mild sensory ataxia noted. He has an otherwise fairly non-focal exam and I reassured the patient in that regard. The etiology of his neuropathy is not completely clear. His EMG and nerve conduction testing confirmed peripheral neuropathy. I am not sure whether we should proceed with invasive testing such as spinal fluid testing. He feels quite stable and not progressed. His exam is stable from a. I would like to go ahead and do testing on a splint for heavy metals. He was in agreement. Since he does not have much in the way of pain, I will not add any symptomatic treatment. I had a long chat with the patient about my findings and the diagnosis of PN, the prognosis and treatment options. We talked about medical treatments and non-pharmacological approaches. We talked about trying to maintain a healthy lifestyle in general. I encouraged the patient to eat healthy, exercise daily and keep well hydrated, to keep a scheduled bedtime and wake time routine, to not skip any meals and eat healthy snacks in between meals and to have protein with every meal. I also warned him about getting overheated and dehydrated and would like for him to use a cane at all times. He admitted that he has had an issue with dehydration recently. He is again advised against climbing ladders or be at heights. He is advised to followup with me in about 6 months, sooner if the need arises and we should be calling him with his most recent test result from his blood work today. He was in agreement.

## 2013-06-21 NOTE — Patient Instructions (Addendum)
I think overall you are doing fairly well but I do want to suggest a few things today:  Remember to drink plenty of fluid, eat healthy meals and do not skip any meals. Try to eat protein with a every meal and eat a healthy snack such as fruit or nuts in between meals. Try to keep a regular sleep-wake schedule and try to exercise daily, particularly in the form of walking, 20-30 minutes a day, if you can.   Please use a cane at all times.   As far as your medications are concerned, I would like to suggest no new medication.    As far as diagnostic testing: blood test for heavy metals.   I would like to see you back in 6 months, sooner if we need to. Please call us with any interim questions, concerns, problems, updates or refill requests.  Please also call us for any test results so we can go over those with you on the phone. Brett Canales is my clinical assistant and will answer any of your questions and relay your messages to me and also relay most of my messages to you.  Our phone number is [phone]. We also have an after hours call service for urgent matters and there is a physician on-call for urgent questions. For any emergencies you know to call 911 or go to the nearest emergency room.

## 2013-06-23 LAB — HEAVY METALS PROFILE II, BLOOD
Arsenic: 5 ug/L (ref 2–23)
Cadmium: 1 ug/L (ref 0.0–1.2)
Lead, Blood: 1 ug/dL (ref 0–19)
Mercury: 2.8 ug/L (ref 0.0–14.9)

## 2013-06-23 NOTE — Progress Notes (Signed)
Quick Note:  Please advise patient that his blood test for heavy metals was within normal limits. ______

## 2013-07-01 NOTE — Progress Notes (Signed)
Quick Note:  Shared normal heavy metal test results per Dr Teofilo Pod notes with wife, she understood and will give patient message. ______

## 2013-07-04 ENCOUNTER — Other Ambulatory Visit: Payer: Self-pay | Admitting: Cardiology

## 2013-07-05 NOTE — Telephone Encounter (Signed)
Rx was sent to pharmacy electronically. 

## 2013-12-01 HISTORY — PX: TRANSTHORACIC ECHOCARDIOGRAM: SHX275

## 2013-12-09 ENCOUNTER — Encounter (HOSPITAL_COMMUNITY): Payer: Self-pay | Admitting: Emergency Medicine

## 2013-12-09 ENCOUNTER — Observation Stay (HOSPITAL_COMMUNITY): Payer: Medicare Other

## 2013-12-09 ENCOUNTER — Emergency Department (HOSPITAL_COMMUNITY): Payer: Medicare Other

## 2013-12-09 ENCOUNTER — Observation Stay (HOSPITAL_COMMUNITY)
Admission: EM | Admit: 2013-12-09 | Discharge: 2013-12-11 | Disposition: A | Discharge status: Home / Self Care | Payer: Medicare Other | Attending: Internal Medicine | Admitting: Internal Medicine

## 2013-12-09 DIAGNOSIS — M810 Age-related osteoporosis without current pathological fracture: Secondary | ICD-10-CM | POA: Insufficient documentation

## 2013-12-09 DIAGNOSIS — R269 Unspecified abnormalities of gait and mobility: Secondary | ICD-10-CM | POA: Insufficient documentation

## 2013-12-09 DIAGNOSIS — I2510 Atherosclerotic heart disease of native coronary artery without angina pectoris: Secondary | ICD-10-CM | POA: Insufficient documentation

## 2013-12-09 DIAGNOSIS — R42 Dizziness and giddiness: Secondary | ICD-10-CM | POA: Insufficient documentation

## 2013-12-09 DIAGNOSIS — Z8673 Personal history of transient ischemic attack (TIA), and cerebral infarction without residual deficits: Secondary | ICD-10-CM

## 2013-12-09 DIAGNOSIS — I428 Other cardiomyopathies: Secondary | ICD-10-CM | POA: Insufficient documentation

## 2013-12-09 DIAGNOSIS — Z951 Presence of aortocoronary bypass graft: Secondary | ICD-10-CM | POA: Insufficient documentation

## 2013-12-09 DIAGNOSIS — I4891 Unspecified atrial fibrillation: Secondary | ICD-10-CM

## 2013-12-09 DIAGNOSIS — I77819 Aortic ectasia, unspecified site: Secondary | ICD-10-CM | POA: Insufficient documentation

## 2013-12-09 DIAGNOSIS — F29 Unspecified psychosis not due to a substance or known physiological condition: Secondary | ICD-10-CM | POA: Insufficient documentation

## 2013-12-09 DIAGNOSIS — I420 Dilated cardiomyopathy: Secondary | ICD-10-CM

## 2013-12-09 DIAGNOSIS — I4510 Unspecified right bundle-branch block: Secondary | ICD-10-CM | POA: Insufficient documentation

## 2013-12-09 DIAGNOSIS — D696 Thrombocytopenia, unspecified: Secondary | ICD-10-CM | POA: Insufficient documentation

## 2013-12-09 DIAGNOSIS — I080 Rheumatic disorders of both mitral and aortic valves: Secondary | ICD-10-CM | POA: Insufficient documentation

## 2013-12-09 DIAGNOSIS — E785 Hyperlipidemia, unspecified: Secondary | ICD-10-CM | POA: Insufficient documentation

## 2013-12-09 DIAGNOSIS — G459 Transient cerebral ischemic attack, unspecified: Principal | ICD-10-CM | POA: Insufficient documentation

## 2013-12-09 DIAGNOSIS — Z87891 Personal history of nicotine dependence: Secondary | ICD-10-CM | POA: Insufficient documentation

## 2013-12-09 DIAGNOSIS — Z955 Presence of coronary angioplasty implant and graft: Secondary | ICD-10-CM | POA: Diagnosis present

## 2013-12-09 DIAGNOSIS — E7800 Pure hypercholesterolemia, unspecified: Secondary | ICD-10-CM | POA: Insufficient documentation

## 2013-12-09 DIAGNOSIS — R279 Unspecified lack of coordination: Secondary | ICD-10-CM | POA: Insufficient documentation

## 2013-12-09 DIAGNOSIS — Z79899 Other long term (current) drug therapy: Secondary | ICD-10-CM | POA: Insufficient documentation

## 2013-12-09 DIAGNOSIS — I351 Nonrheumatic aortic (valve) insufficiency: Secondary | ICD-10-CM

## 2013-12-09 DIAGNOSIS — Z7982 Long term (current) use of aspirin: Secondary | ICD-10-CM | POA: Insufficient documentation

## 2013-12-09 HISTORY — DX: Personal history of transient ischemic attack (TIA), and cerebral infarction without residual deficits: Z86.73

## 2013-12-09 HISTORY — DX: Unspecified mononeuropathy of unspecified lower limb: G57.90

## 2013-12-09 LAB — CBC WITH DIFFERENTIAL/PLATELET
BASOS PCT: 0 % (ref 0–1)
Basophils Absolute: 0 10*3/uL (ref 0.0–0.1)
Eosinophils Absolute: 0.1 10*3/uL (ref 0.0–0.7)
Eosinophils Relative: 1 % (ref 0–5)
HEMATOCRIT: 34.3 % — AB (ref 39.0–52.0)
HEMOGLOBIN: 12 g/dL — AB (ref 13.0–17.0)
LYMPHS ABS: 0.8 10*3/uL (ref 0.7–4.0)
Lymphocytes Relative: 14 % (ref 12–46)
MCH: 32.5 pg (ref 26.0–34.0)
MCHC: 35 g/dL (ref 30.0–36.0)
MCV: 93 fL (ref 78.0–100.0)
MONO ABS: 0.3 10*3/uL (ref 0.1–1.0)
MONOS PCT: 5 % (ref 3–12)
NEUTROS ABS: 4.3 10*3/uL (ref 1.7–7.7)
NEUTROS PCT: 80 % — AB (ref 43–77)
Platelets: 118 10*3/uL — ABNORMAL LOW (ref 150–400)
RBC: 3.69 MIL/uL — AB (ref 4.22–5.81)
RDW: 12.1 % (ref 11.5–15.5)
WBC: 5.4 10*3/uL (ref 4.0–10.5)

## 2013-12-09 LAB — RAPID URINE DRUG SCREEN, HOSP PERFORMED
Amphetamines: NOT DETECTED
Barbiturates: NOT DETECTED
Benzodiazepines: NOT DETECTED
COCAINE: NOT DETECTED
Opiates: NOT DETECTED
Tetrahydrocannabinol: NOT DETECTED

## 2013-12-09 LAB — URINALYSIS, ROUTINE W REFLEX MICROSCOPIC
Bilirubin Urine: NEGATIVE
GLUCOSE, UA: NEGATIVE mg/dL
HGB URINE DIPSTICK: NEGATIVE
Ketones, ur: NEGATIVE mg/dL
LEUKOCYTES UA: NEGATIVE
Nitrite: NEGATIVE
PH: 6 (ref 5.0–8.0)
PROTEIN: NEGATIVE mg/dL
SPECIFIC GRAVITY, URINE: 1.015 (ref 1.005–1.030)
Urobilinogen, UA: 0.2 mg/dL (ref 0.0–1.0)

## 2013-12-09 LAB — BASIC METABOLIC PANEL
BUN: 25 mg/dL — AB (ref 6–23)
CHLORIDE: 100 meq/L (ref 96–112)
CO2: 25 mEq/L (ref 19–32)
CREATININE: 0.93 mg/dL (ref 0.50–1.35)
Calcium: 9.5 mg/dL (ref 8.4–10.5)
GFR calc non Af Amer: 73 mL/min — ABNORMAL LOW (ref >90)
GFR, EST AFRICAN AMERICAN: 85 mL/min — AB (ref >90)
GLUCOSE: 174 mg/dL — AB (ref 70–99)
POTASSIUM: 4.1 meq/L (ref 3.7–5.3)
Sodium: 137 mEq/L (ref 137–147)

## 2013-12-09 LAB — I-STAT TROPONIN, ED: Troponin i, poc: 0.01 ng/mL (ref 0.00–0.08)

## 2013-12-09 LAB — CBG MONITORING, ED: Glucose-Capillary: 99 mg/dL (ref 70–99)

## 2013-12-09 MED ORDER — OMEGA-3-ACID ETHYL ESTERS 1 G PO CAPS
1.0000 g | ORAL_CAPSULE | Freq: Every day | ORAL | Status: DC
  Administered 2013-12-09 – 2013-12-11 (×3): 1 g via ORAL
  Filled 2013-12-09 (×3): qty 1

## 2013-12-09 MED ORDER — ASPIRIN EC 325 MG PO TBEC
325.0000 mg | DELAYED_RELEASE_TABLET | Freq: Every day | ORAL | Status: DC
  Administered 2013-12-10: 325 mg via ORAL
  Filled 2013-12-09 (×2): qty 1

## 2013-12-09 MED ORDER — SODIUM CHLORIDE 0.9 % IV BOLUS (SEPSIS)
500.0000 mL | Freq: Once | INTRAVENOUS | Status: AC
  Administered 2013-12-09: 500 mL via INTRAVENOUS

## 2013-12-09 MED ORDER — ATORVASTATIN CALCIUM 80 MG PO TABS
80.0000 mg | ORAL_TABLET | Freq: Every day | ORAL | Status: DC
  Administered 2013-12-10: 80 mg via ORAL
  Filled 2013-12-09 (×2): qty 1

## 2013-12-09 MED ORDER — OMEGA-3 FATTY ACIDS 1000 MG PO CAPS: 2.0000 g | ORAL_CAPSULE | Freq: Every day | ORAL | Status: DC

## 2013-12-09 MED ORDER — ADULT MULTIVITAMIN W/MINERALS CH
1.0000 | ORAL_TABLET | Freq: Every day | ORAL | Status: DC
  Administered 2013-12-10 – 2013-12-11 (×2): 1 via ORAL
  Filled 2013-12-09 (×2): qty 1

## 2013-12-09 MED ORDER — ASPIRIN EC 325 MG PO TBEC: 325.0000 mg | DELAYED_RELEASE_TABLET | Freq: Every day | ORAL | Status: DC

## 2013-12-09 MED ORDER — ATORVASTATIN CALCIUM 80 MG PO TABS
80.0000 mg | ORAL_TABLET | Freq: Once | ORAL | Status: AC
  Administered 2013-12-09: 80 mg via ORAL
  Filled 2013-12-09: qty 1

## 2013-12-09 MED ORDER — MULTIVITAMINS PO CAPS: 1.0000 | ORAL_CAPSULE | Freq: Every day | ORAL | Status: DC

## 2013-12-09 MED ORDER — ENOXAPARIN SODIUM 40 MG/0.4ML ~~LOC~~ SOLN
40.0000 mg | SUBCUTANEOUS | Status: DC
  Filled 2013-12-09: qty 0.4

## 2013-12-09 MED ORDER — METOPROLOL TARTRATE 12.5 MG HALF TABLET
12.5000 mg | ORAL_TABLET | Freq: Two times a day (BID) | ORAL | Status: DC
  Administered 2013-12-09 – 2013-12-11 (×4): 12.5 mg via ORAL
  Filled 2013-12-09 (×5): qty 1

## 2013-12-09 MED ORDER — CALCIUM CARBONATE-VITAMIN D 500-200 MG-UNIT PO TABS
1.0000 | ORAL_TABLET | Freq: Two times a day (BID) | ORAL | Status: DC
  Administered 2013-12-09 – 2013-12-11 (×4): 1 via ORAL
  Filled 2013-12-09 (×6): qty 1

## 2013-12-09 NOTE — ED Notes (Signed)
While assessing the patient, he told the same story about his niece twice within minutes.

## 2013-12-09 NOTE — ED Notes (Signed)
Patient still A&O x4. Moving all extremities. No facial droop noted or muscle weakness. Family and patient aware of reassignment to floor. Patient had 475 cc urine output. Resting comfortably.

## 2013-12-09 NOTE — H&P (Signed)
History and Physical    Tanner [name] ZOX:[id number] DOB: August 31, 1924 DOA: 12/09/2013  Referring physician: Dr. Leonides Schanz PCP: Thressa Sheller, MD  Specialists: none  Chief Complaint: transient memory loss  HPI: Tanner [name] is a 80 y.o. male very active gentleman who has a past medical history significant for coronary artery disease status post CABG in 2012, hyperlipidemia, A. fib in the postoperative setting, presents to the emergency room with the chief complaint of dizziness, poor balance, difficulties with word finding and confusion that started today. He was working on a rental house fixing a light fixture when he began to feel this way. He said that it lasted for about 6 hours, including in the ED, but now all his symptoms have resolved. Patient denies any chest pain, palpitations, shortness of breath. He has no fever or chills. He denies any lightheadedness or dizziness. He has had 2 similar episodes in the remote past, and was told he had global transient amnesia at that time. He denies any abdominal pain, nausea, vomiting or diarrhea. In the emergency room, patient was stable vital signs, mildly bradycardic into the mild 50s (patient states that this is his baseline). [facility] was asked to admit for TIA workup.  Review of Systems: As per history of present illness, otherwise negative.  Past Medical History  Diagnosis Date  . Osteoporosis   . Hypercholesteremia   . Tremors of nervous system     hands  . CAD (coronary artery disease), native coronary artery 1991; 2012    PCI - LAD; Cath in 2012 revealed severe RCA & LAD Diseaes -- CABG x 2  . AF (atrial fibrillation) -- Post Operative   . Neuropathy of lower extremity    Past Surgical History  Procedure Laterality Date  . Coronary artery bypass graft  10/2010    LIMA-LAD, SVG-rPDA  . Doppler echocardiography  10/18/2004    EF 55-65%,BORDERLINE -enlarged aortic root,mild aortic insuff.,trace tricus.insuff.  . Nm myocar perf wall  motion  07/24/1998    EF 71%  . Cardiac catheterization  10/17/2010    80%prox w/mild in-stent restenosis w/75% mid-LADstenosis aft the stent,multi severe stenoses RCAw/heavily calcified vessel,norm LV   Social History:  reports that he quit smoking about 75 years ago. His smoking use included Cigarettes. He has a 5 pack-year smoking history. He has quit using smokeless tobacco. He reports that he does not drink alcohol or use illicit drugs.  No Known Allergies  Family History  Problem Relation Age of Onset  . Heart Problems Mother   . Parkinson's disease Father   . Heart Problems Father     Prior to Admission medications   Medication Sig Start Date End Date Taking? Authorizing Provider  aspirin 325 MG EC tablet Take 325 mg by mouth daily.   Yes Historical Provider, MD  atorvastatin (LIPITOR) 80 MG tablet Take 80 mg by mouth daily.   Yes Historical Provider, MD  calcium-vitamin D (OSCAL WITH D) 500-200 MG-UNIT per tablet Take 1 tablet by mouth 2 (two) times daily.   Yes Historical Provider, MD  fish oil-omega-3 fatty acids 1000 MG capsule Take 2 g by mouth daily.   Yes Historical Provider, MD  metoprolol tartrate (LOPRESSOR) 25 MG tablet Take 12.5 mg by mouth 2 (two) times daily.   Yes Historical Provider, MD  Multiple Vitamin (MULTIVITAMIN) capsule Take 1 capsule by mouth daily.   Yes Historical Provider, MD  naproxen sodium (ANAPROX) 220 MG tablet Take 220 mg by mouth as needed.  Yes Historical Provider, MD   Physical Exam: Filed Vitals:   12/09/13 1530 12/09/13 1600 12/09/13 1630 12/09/13 1730  BP: 136/77 161/81 134/87 165/81  Pulse: 60 57 53 55  Temp:      TempSrc:      Resp: 17 17 17 17   SpO2: 98% 97% 100% 99%     General:  Pleasant Caucasian male in no apparent distress, alert and oriented x4.  Eyes: no scleral icterus  ENT: moist oropharynx  Neck: supple, no JVD  Cardiovascular: regular rate without MRG; 2+ peripheral pulses  Respiratory: CTA biL, good air  movement without wheezing, rhonchi or crackled  Abdomen: soft, non tender to palpation, positive bowel sounds, no guarding, no rebound  Skin: no rashes  Musculoskeletal: no peripheral edema  Psychiatric: normal mood and affect  Neurologic: CN 2-12 grossly intact, MS 5/5 in all 4, sensation intact   Labs on Admission:  Basic Metabolic Panel:  Recent Labs Lab 12/09/13 1527  NA 137  K 4.1  CL 100  CO2 25  GLUCOSE 174*  BUN 25*  CREATININE 0.93  CALCIUM 9.5   Liver Function Tests: No results found for this basename: AST, ALT, ALKPHOS, BILITOT, PROT, ALBUMIN,  in the last 168 hours No results found for this basename: LIPASE, AMYLASE,  in the last 168 hours No results found for this basename: AMMONIA,  in the last 168 hours CBC:  Recent Labs Lab 12/09/13 1527  WBC 5.4  NEUTROABS 4.3  HGB 12.0*  HCT 34.3*  MCV 93.0  PLT 118*   Cardiac Enzymes: No results found for this basename: CKTOTAL, CKMB, CKMBINDEX, TROPONINI,  in the last 168 hours  BNP (last 3 results) No results found for this basename: PROBNP,  in the last 8760 hours CBG: No results found for this basename: GLUCAP,  in the last 168 hours  Radiological Exams on Admission: Ct Head Wo Contrast  12/09/2013   CLINICAL DATA:  Confusion and dizziness  EXAM: CT HEAD WITHOUT CONTRAST  TECHNIQUE: Contiguous axial images were obtained from the base of the skull through the vertex without intravenous contrast. Study was obtained within 24 hr of patient's arrival at the emergency department.  COMPARISON:  None.  FINDINGS: There is mild diffuse atrophy. There is no mass, hemorrhage, extra-axial fluid collection, or midline shift. There is patchy small vessel disease in the centra semiovale bilaterally. Elsewhere, gray-white compartments appear normal. There is no demonstrable acute infarct.  Bony calvarium appears intact. Visualized mastoid air cells are clear.  IMPRESSION: Atrophy with periventricular small vessel disease. No  intracranial mass, hemorrhage, or acute appearing infarct.   Electronically Signed   By: Lowella Grip M.D.   On: 12/09/2013 15:43    EKG: Independently reviewed.  Assessment/Plan Principal Problem:   TIA (transient ischemic attack) Active Problems:   CAD (coronary artery disease), native coronary artery   Hypercholesteremia   AF (atrial fibrillation) -- Post Operative   Mild aortic insufficiency   Thrombocytopenia   TIA - patient's symptoms have resolved by the time I felt the patient. Will admit for observation, on telemetry floor, obtain MRI, 2-D echo carotid Doppler ultrasound. Continue aspirin. Coronary artery disease - no chest pain, no dyspnea with exertion, no chest pain at home with exertion. Continue home medications. Hyperlipidemia - continue statin. Check lipid panel in the morning. Check hemoglobin A1c. A. Fib - patient to sinus rhythm currently. Continue metoprolol. Thrombocytopenia - patient has a degree of chronic thrombocytopenia. This is stable, no evidence of bleeding.  Diet: Heart healthy diet Fluids: Normal saline DVT Prophylaxis: SCDs  Code Status: Full code  Family Communication: Wife and nephew at the bedside  Disposition Plan: Inpatient  Time spent: 40  This note has been created with Surveyor, quantity. Any transcriptional errors are unintentional.   Reily Treloar M. Cruzita Lederer, MD [facility] Pager [phone]  If 7PM-7AM, please contact night-coverage [email or web address] Password [facility] 12/09/2013, 6:22 PM

## 2013-12-09 NOTE — ED Notes (Signed)
Pt presents after having an episode of dizziness.  Denies pain.  Pt reports that he had been working on his house when he started feeling dizzy.  Sts he couldn't recall peoples names either.  Pt sts that symptoms have resolved.

## 2013-12-09 NOTE — ED Notes (Signed)
Bed: WA17 Expected date:  Expected time:  Means of arrival:  Comments: Triage 1  

## 2013-12-09 NOTE — ED Provider Notes (Signed)
TIME SEEN: 3:07 PM  CHIEF COMPLAINT: Dizziness, confusion, amnesia  HPI: Patient is a 80 y.o. M with history of coronary artery disease status post CABG, paroxysmal atrial fibrillation on aspirin 325 mg, hyperlipidemia who presents emergency department with dizziness, staggering gait and confusion that started today. Patient is a very active man he reports he was working on a house today placing a light fixture and fixing plumbing when he began to feel "strange". He states he felt he was having a hard time walking and then had a hard time recalling what he was doing, recalling people's names. He states he had difficulty with word finding but this is improving. He has had episodes of transient global amnesia in the past. He states he has never been worked up for this before. Denies any fever or cough, chest pain, shortness of breath, palpitations, numbness or focal weakness, vision or hearing changes, vomiting or diarrhea.  PCP is Dr. Claudette Head  ROS: See HPI Constitutional: no fever  Eyes: no drainage  ENT: no runny nose   Cardiovascular:  no chest pain  Resp: no SOB  GI: no vomiting GU: no dysuria Integumentary: no rash  Allergy: no hives  Musculoskeletal: no leg swelling  Neurological: no slurred speech ROS otherwise negative  PAST MEDICAL HISTORY/PAST SURGICAL HISTORY:  Past Medical History  Diagnosis Date  . Osteoporosis   . Hypercholesteremia   . Tremors of nervous system     hands  . CAD (coronary artery disease), native coronary artery 1991; 2012    PCI - LAD; Cath in 2012 revealed severe RCA & LAD Diseaes -- CABG x 2  . AF (atrial fibrillation) -- Post Operative   . Neuropathy of lower extremity     MEDICATIONS:  Prior to Admission medications   Medication Sig Start Date End Date Taking? Authorizing Provider  aspirin 325 MG EC tablet Take 325 mg by mouth daily.   Yes Historical Provider, MD  atorvastatin (LIPITOR) 80 MG tablet Take 80 mg by mouth daily.   Yes  Historical Provider, MD  calcium-vitamin D (OSCAL WITH D) 500-200 MG-UNIT per tablet Take 1 tablet by mouth 2 (two) times daily.   Yes Historical Provider, MD  fish oil-omega-3 fatty acids 1000 MG capsule Take 2 g by mouth daily.   Yes Historical Provider, MD  metoprolol tartrate (LOPRESSOR) 25 MG tablet Take 12.5 mg by mouth 2 (two) times daily.   Yes Historical Provider, MD  Multiple Vitamin (MULTIVITAMIN) capsule Take 1 capsule by mouth daily.   Yes Historical Provider, MD  naproxen sodium (ANAPROX) 220 MG tablet Take 220 mg by mouth as needed.   Yes Historical Provider, MD    ALLERGIES:  No Known Allergies  SOCIAL HISTORY:  History  Substance Use Topics  . Smoking status: Former Smoker -- 1.00 packs/day for 5 years    Types: Cigarettes    Quit date: 02/08/1938  . Smokeless tobacco: Former Systems developer  . Alcohol Use: No    FAMILY HISTORY: Family History  Problem Relation Age of Onset  . Heart Problems Mother   . Parkinson's disease Father   . Heart Problems Father     EXAM: BP 124/67  Pulse 61  Temp(Src) 98.1 F (36.7 C) (Oral)  Resp 16  SpO2 96% CONSTITUTIONAL: Alert and oriented x3 and responds appropriately to questions. Well-appearing; well-nourished HEAD: Normocephalic EYES: Conjunctivae clear, PERRL ENT: normal nose; no rhinorrhea; moist mucous membranes; pharynx without lesions noted NECK: Supple, no meningismus, no LAD  CARD: RRR; S1  and S2 appreciated; no murmurs, no clicks, no rubs, no gallops RESP: Normal chest excursion without splinting or tachypnea; breath sounds clear and equal bilaterally; no wheezes, no rhonchi, no rales,  ABD/GI: Normal bowel sounds; non-distended; soft, non-tender, no rebound, no guarding BACK:  The back appears normal and is non-tender to palpation, there is no CVA tenderness EXT: Normal ROM in all joints; non-tender to palpation; no edema; normal capillary refill; no cyanosis    SKIN: Normal color for age and race; warm NEURO: Moves all  extremities equally, strength 5/5 upper extremity, sensation to light touch intact diffusely, cranial nerve 2-12 intact, no dysmetria to finger-nose testing bilaterally PSYCH: The patient's mood and manner are appropriate. Grooming and personal hygiene are appropriate.  MEDICAL DECISION MAKING:   Pt here with episode of confusion and difficulty recalling events in the word finding. Concern for possible TIA. His symptoms are slowly improving and his NIH stroke scale is currently 0. He is a TPA candidate given his symptoms have spontaneously resolved. We'll obtain basic labs, urine, CT of his head. Have recommended admission for TIA workup.  ED PROGRESS: Patient's workup has been unremarkable. Labs, urine negative. Head CT shows no intracranial abnormality.  5:49 PM  Discussed with patient and wife who agrees for admission. Discuss with Dr. Cruzita Lederer with hospitalist service for admission for TIA workup.     EKG Interpretation  Date/Time:  Thursday December 09 2013 14:19:38 EDT Ventricular Rate:  54 PR Interval:  176 QRS Duration: 98 QT Interval:  438 QTC Calculation: 415 R Axis:   -22 Text Interpretation:  Sinus bradycardia Incomplete right bundle branch block Nonspecific T wave abnormality Abnormal ECG No significant change since last tracing Confirmed by Cheryn Lundquist,  DO, Eryc Bodey [phone]) on 12/09/2013 3:06:28 PM         [place], DO 12/09/13 1750

## 2013-12-09 NOTE — Progress Notes (Signed)
   CARE MANAGEMENT ED NOTE 12/09/2013  Patient:  Tanner [name],Tanner [name]   Account Number:  [id number]  Date Initiated:  12/09/2013  Documentation initiated by:  Livia Snellen  Subjective/Objective Assessment:   Patient presents to Ed with with c/o feeling dizzy, difficulty finding words.     Subjective/Objective Assessment Detail:   Patient with pmhx of CAD.     Action/Plan:   Action/Plan Detail:   Anticipated DC Date:       Status Recommendation to Physician:   Result of Recommendation:    Other ED [place]  Other  PCP issues    Choice offered to / List presented to:            Status of service:  Completed, signed off  ED Comments:   ED Comments Detail:  EDCM spoke to patient at bedside.  Patient's wife at bedside.  Patient reports he lives at home with his wife. Patient does not have any medical equipment at home. Patient does not have any home health servoces at this time.  Patient is able to complete his ADL's without difficulty per patient.  Patient reports he is [name] resiudent at [facility] in [place] in the [place] living section.  EDCM provided patient's wife [name] list of home health agencies in [employer]  EDCM explained to patient with home health, he may receive [name] visiting RN, PT, OT and social worker if needed.  Patient thankful for reosurces.  No further EDCM needs at this time.

## 2013-12-10 DIAGNOSIS — E7800 Pure hypercholesterolemia, unspecified: Secondary | ICD-10-CM

## 2013-12-10 DIAGNOSIS — D696 Thrombocytopenia, unspecified: Secondary | ICD-10-CM

## 2013-12-10 DIAGNOSIS — I359 Nonrheumatic aortic valve disorder, unspecified: Secondary | ICD-10-CM

## 2013-12-10 DIAGNOSIS — I2510 Atherosclerotic heart disease of native coronary artery without angina pectoris: Secondary | ICD-10-CM

## 2013-12-10 LAB — GLUCOSE, CAPILLARY
GLUCOSE-CAPILLARY: 98 mg/dL (ref 70–99)
GLUCOSE-CAPILLARY: 91 mg/dL (ref 70–99)
Glucose-Capillary: 97 mg/dL (ref 70–99)
Glucose-Capillary: 134 mg/dL — ABNORMAL HIGH (ref 70–99)

## 2013-12-10 LAB — BASIC METABOLIC PANEL
BUN: 21 mg/dL (ref 6–23)
CALCIUM: 9.4 mg/dL (ref 8.4–10.5)
CO2: 26 meq/L (ref 19–32)
CREATININE: 0.86 mg/dL (ref 0.50–1.35)
Chloride: 104 mEq/L (ref 96–112)
GFR calc Af Amer: 88 mL/min — ABNORMAL LOW (ref >90)
GFR calc non Af Amer: 76 mL/min — ABNORMAL LOW (ref >90)
GLUCOSE: 91 mg/dL (ref 70–99)
Potassium: 4 mEq/L (ref 3.7–5.3)
Sodium: 141 mEq/L (ref 137–147)

## 2013-12-10 LAB — CBC
HCT: 37.1 % — ABNORMAL LOW (ref 39.0–52.0)
HEMOGLOBIN: 12.7 g/dL — AB (ref 13.0–17.0)
MCH: 31.8 pg (ref 26.0–34.0)
MCHC: 34.2 g/dL (ref 30.0–36.0)
MCV: 93 fL (ref 78.0–100.0)
Platelets: 130 10*3/uL — ABNORMAL LOW (ref 150–400)
RBC: 3.99 MIL/uL — AB (ref 4.22–5.81)
RDW: 12 % (ref 11.5–15.5)
WBC: 4.9 10*3/uL (ref 4.0–10.5)

## 2013-12-10 LAB — HEMOGLOBIN A1C
Hgb A1c MFr Bld: 6.1 % — ABNORMAL HIGH (ref <5.7)
Mean Plasma Glucose: 128 mg/dL — ABNORMAL HIGH (ref <117)

## 2013-12-10 LAB — MRSA PCR SCREENING: MRSA by PCR: POSITIVE — AB

## 2013-12-10 LAB — LIPID PANEL
CHOL/HDL RATIO: 2.6 ratio
Cholesterol: 131 mg/dL (ref 0–200)
HDL: 51 mg/dL (ref >39)
LDL CALC: 60 mg/dL (ref 0–99)
Triglycerides: 98 mg/dL (ref <150)
VLDL: 20 mg/dL (ref 0–40)

## 2013-12-10 MED ORDER — MUPIROCIN 2 % EX OINT
1.0000 "application " | TOPICAL_OINTMENT | Freq: Two times a day (BID) | CUTANEOUS | Status: DC
  Administered 2013-12-10 – 2013-12-11 (×4): 1 via NASAL
  Filled 2013-12-10: qty 22

## 2013-12-10 MED ORDER — CHLORHEXIDINE GLUCONATE CLOTH 2 % EX PADS
6.0000 | MEDICATED_PAD | Freq: Every day | CUTANEOUS | Status: DC
  Administered 2013-12-10: 6 via TOPICAL

## 2013-12-10 NOTE — Progress Notes (Signed)
Bilateral carotid artery duplex:  1-39% ICA stenosis.  Vertebral artery flow is antegrade.     

## 2013-12-10 NOTE — Progress Notes (Signed)
CR is an 80 year old male who is alert and oriented X4.  He follows commands and is very friendly.  He is sitting up in bed talking with his wife who is at bedside.  He wears glasses, his pupils respond to light.  Tongue is pink and moist, he is wearing lower dentures.  Skin is clean, dry, and intact.  He has a strong grip on both right and left sides.  Cap refill is less than 3 seconds.  Right and left radial pulses are +2, right and left pedal pulses are +1.  Patient seems to be in good spirits, patient has no complaints.  Vanetta Shawl, SN [facility]

## 2013-12-10 NOTE — Care Management Note (Signed)
    Page 1 of 1   12/10/2013     2:28:51 PM   CARE MANAGEMENT NOTE 12/10/2013  Patient:  Tanner [name],Tanner [name]   Account Number:  [id number]  Date Initiated:  12/10/2013  Documentation initiated by:  [facility]  Subjective/Objective Assessment:   80 Y/O M ADMITTED W/TIA.     Action/Plan:   [facility].HAS PCP,PHARMACY.   Anticipated DC Date:  12/10/2013   Anticipated DC Plan:  [place]  CM consult      Choice offered to / List presented to:             Status of service:  In process, will continue to follow Medicare Important Message given?   (If response is "NO", the following Medicare IM given date fields will be blank) Date Medicare IM given:   Date Additional Medicare IM given:    Discharge Disposition:    Per UR Regulation:  Reviewed for med. necessity/level of care/duration of stay  If discussed at [place] of Stay Meetings, dates discussed:    Comments:  12/10/13 Marco Raper RN,BSN NCM [phone] ED CM HAS ALREADY PROVIDED [place] AGENCY LIST AS RESOURCE.NO ANTICIPATED D/C NEEDS.

## 2013-12-10 NOTE — Progress Notes (Signed)
I have reviewed all documentation from student nurse. 

## 2013-12-10 NOTE — Progress Notes (Signed)
UR completed 

## 2013-12-10 NOTE — Progress Notes (Signed)
[facility] PROGRESS NOTE  Tanner [name] IRJ:[id number] DOB: [birth year] DOA: 12/09/2013 PCP: Thressa Sheller, MD  Assessment/Plan:  TIA  - symptoms have resolved  -MRI/MRA negative -Carotid duplex/ECHO pending -continue ASA -LDL <70 -FU Hbaic   Coronary artery disease -stable -continue ASA/metoprolol/statin -FU ECHO  Hyperlipidemia - continue statin. LDL<70   H/o A. Fib post op during CABG -in NSR now, rate controlled, on ASA, metoprolol.   Thrombocytopenia  - chronic -stable   Code Status: Full code  Family Communication: none at the bedside  Disposition Plan: home pending complete workup   HPI/Subjective: Feels well, no further symptoms  Objective: Filed Vitals:   12/10/13 1445  BP: 140/63  Pulse: 52  Temp: 97.6 F (36.4 C)  Resp: 18    Intake/Output Summary (Last 24 hours) at 12/10/13 1800 Last data filed at 12/10/13 0900  Gross per 24 hour  Intake    250 ml  Output    575 ml  Net   -325 ml   Filed Weights   12/09/13 1930  Weight: 70.353 kg (155 lb 1.6 oz)    Exam:   General:  AAOx3  Cardiovascular: S1S2/RRR  Respiratory: CTAB  Abdomen: soft, Nt, BS present  Musculoskeletal: no edema c/c  Neuro: non focal  Data Reviewed: Basic Metabolic Panel:  Recent Labs Lab 12/09/13 1527 12/10/13 0425  NA 137 141  K 4.1 4.0  CL 100 104  CO2 25 26  GLUCOSE 174* 91  BUN 25* 21  CREATININE 0.93 0.86  CALCIUM 9.5 9.4   Liver Function Tests: No results found for this basename: AST, ALT, ALKPHOS, BILITOT, PROT, ALBUMIN,  in the last 168 hours No results found for this basename: LIPASE, AMYLASE,  in the last 168 hours No results found for this basename: AMMONIA,  in the last 168 hours CBC:  Recent Labs Lab 12/09/13 1527 12/10/13 0425  WBC 5.4 4.9  NEUTROABS 4.3  --   HGB 12.0* 12.7*  HCT 34.3* 37.1*  MCV 93.0 93.0  PLT 118* 130*   Cardiac Enzymes: No results found for this basename: CKTOTAL, CKMB, CKMBINDEX, TROPONINI,   in the last 168 hours BNP (last 3 results) No results found for this basename: PROBNP,  in the last 8760 hours CBG:  Recent Labs Lab 12/09/13 1838 12/09/13 2105 12/10/13 0714 12/10/13 1138  GLUCAP 99 98 97 91    Recent Results (from the past 240 hour(s))  MRSA PCR SCREENING     Status: Abnormal   Collection Time    12/09/13 11:30 PM      Result Value Ref Range Status   MRSA by PCR POSITIVE (*) NEGATIVE Final   Comment:            The GeneXpert MRSA Assay (FDA     approved for NASAL specimens     only), is one component of a     comprehensive MRSA colonization     surveillance program. It is not     intended to diagnose MRSA     infection nor to guide or     monitor treatment for     MRSA infections.     RESULT CALLED TO, READ BACK BY AND VERIFIED WITH:     KELSEY RN/4W AT 0200 ON [id number] BY DLONG     Studies: Ct Head Wo Contrast  12/09/2013   CLINICAL DATA:  Confusion and dizziness  EXAM: CT HEAD WITHOUT CONTRAST  TECHNIQUE: Contiguous axial images were obtained from the base of the skull through  the vertex without intravenous contrast. Study was obtained within 24 hr of patient's arrival at the emergency department.  COMPARISON:  None.  FINDINGS: There is mild diffuse atrophy. There is no mass, hemorrhage, extra-axial fluid collection, or midline shift. There is patchy small vessel disease in the centra semiovale bilaterally. Elsewhere, gray-white compartments appear normal. There is no demonstrable acute infarct.  Bony calvarium appears intact. Visualized mastoid air cells are clear.  IMPRESSION: Atrophy with periventricular small vessel disease. No intracranial mass, hemorrhage, or acute appearing infarct.   Electronically Signed   By: Lowella Grip M.D.   On: 12/09/2013 15:43   Mr Jodene Nam Head Wo Contrast  12/10/2013   CLINICAL DATA:  TIA.  EXAM: MRI HEAD WITHOUT CONTRAST  MRA HEAD WITHOUT CONTRAST  TECHNIQUE: Multiplanar, multiecho pulse sequences of the brain and  surrounding structures were obtained without intravenous contrast. Angiographic images of the head were obtained using MRA technique without contrast.  COMPARISON:  Prior CT performed earlier on the same day  FINDINGS: MRI HEAD FINDINGS  Mild cerebral atrophy noted. Scattered and confluent T2/FLAIR hyperintensity within the periventricular and deep white matter both cerebral hemispheres is most consistent with moderate chronic small vessel ischemic changes. Focal encephalomalacia within the right frontal lobe likely represents a remote infarct (series 6, image 16). Single subcentimeter hypo intense focus seen within the right parietal lobe on gradient echo sequence likely represents a small chronic microhemorrhage.  No focal parenchymal signal abnormality is identified. No mass lesion, midline shift, or extra-axial fluid collection. Ventricles are normal in size without evidence of hydrocephalus.  No diffusion-weighted signal abnormality is identified to suggest acute intracranial infarct. Gray-white matter differentiation is maintained. Normal flow voids are seen within the intracranial vasculature. No intracranial hemorrhage identified.  The cervicomedullary junction is normal. Pituitary gland is within normal limits. Pituitary stalk is midline. The globes and optic nerves demonstrate a normal appearance with normal signal intensity.  The bone marrow signal intensity is normal. Calvarium is intact. Visualized upper cervical spine is within normal limits.  Scalp soft tissues are unremarkable.  Paranasal sinuses are clear.  No mastoid effusion.  MRA HEAD FINDINGS  The visualized portions of the distal cervical internal carotid arteries are widely patent with antegrade flow. The petrous, cavernous, and supra clinoid segments are symmetric in caliber bilaterally. A1 segments, anterior communicating artery, and anterior cerebral arteries are widely patent. Middle cerebral arteries are widely patent with antegrade flow  without high-grade flow-limiting stenosis or proximal branch occlusion. Distal MCA branches are symmetric bilaterally. No intracranial aneurysm within the anterior circulation.  The vertebral arteries are widely patent with antegrade flow. The left vertebral artery is dominant. The posterior inferior cerebral arteries are normal. Vertebrobasilar junction and basilar artery are widely patent with antegrade flow without evidence of basilar tip stenosis or aneurysm. Posterior cerebral arteries are normal bilaterally. The superior cerebellar arteries and anterior inferior cerebellar arteries are widely patent bilaterally. No intracranial aneurysm within the posterior circulation.  IMPRESSION: MRI BRAIN:  1. No acute intracranial infarct or other abnormality identified. 2. Small remote right frontal lobe infarct as above. 3. Mild atrophy with moderate chronic microvascular ischemic disease.  MRA BRAIN:  Normal MRA of the brain without evidence of high-grade flow-limiting stenosis, proximal branch occlusion, or other acute abnormality. No intracranial aneurysm.   Electronically Signed   By: Jeannine Boga M.D.   On: 12/10/2013 00:33   Mri Brain Without Contrast  12/10/2013   CLINICAL DATA:  TIA.  EXAM: MRI HEAD WITHOUT CONTRAST  MRA HEAD WITHOUT CONTRAST  TECHNIQUE: Multiplanar, multiecho pulse sequences of the brain and surrounding structures were obtained without intravenous contrast. Angiographic images of the head were obtained using MRA technique without contrast.  COMPARISON:  Prior CT performed earlier on the same day  FINDINGS: MRI HEAD FINDINGS  Mild cerebral atrophy noted. Scattered and confluent T2/FLAIR hyperintensity within the periventricular and deep white matter both cerebral hemispheres is most consistent with moderate chronic small vessel ischemic changes. Focal encephalomalacia within the right frontal lobe likely represents a remote infarct (series 6, image 16). Single subcentimeter hypo  intense focus seen within the right parietal lobe on gradient echo sequence likely represents a small chronic microhemorrhage.  No focal parenchymal signal abnormality is identified. No mass lesion, midline shift, or extra-axial fluid collection. Ventricles are normal in size without evidence of hydrocephalus.  No diffusion-weighted signal abnormality is identified to suggest acute intracranial infarct. Gray-white matter differentiation is maintained. Normal flow voids are seen within the intracranial vasculature. No intracranial hemorrhage identified.  The cervicomedullary junction is normal. Pituitary gland is within normal limits. Pituitary stalk is midline. The globes and optic nerves demonstrate a normal appearance with normal signal intensity.  The bone marrow signal intensity is normal. Calvarium is intact. Visualized upper cervical spine is within normal limits.  Scalp soft tissues are unremarkable.  Paranasal sinuses are clear.  No mastoid effusion.  MRA HEAD FINDINGS  The visualized portions of the distal cervical internal carotid arteries are widely patent with antegrade flow. The petrous, cavernous, and supra clinoid segments are symmetric in caliber bilaterally. A1 segments, anterior communicating artery, and anterior cerebral arteries are widely patent. Middle cerebral arteries are widely patent with antegrade flow without high-grade flow-limiting stenosis or proximal branch occlusion. Distal MCA branches are symmetric bilaterally. No intracranial aneurysm within the anterior circulation.  The vertebral arteries are widely patent with antegrade flow. The left vertebral artery is dominant. The posterior inferior cerebral arteries are normal. Vertebrobasilar junction and basilar artery are widely patent with antegrade flow without evidence of basilar tip stenosis or aneurysm. Posterior cerebral arteries are normal bilaterally. The superior cerebellar arteries and anterior inferior cerebellar arteries are  widely patent bilaterally. No intracranial aneurysm within the posterior circulation.  IMPRESSION: MRI BRAIN:  1. No acute intracranial infarct or other abnormality identified. 2. Small remote right frontal lobe infarct as above. 3. Mild atrophy with moderate chronic microvascular ischemic disease.  MRA BRAIN:  Normal MRA of the brain without evidence of high-grade flow-limiting stenosis, proximal branch occlusion, or other acute abnormality. No intracranial aneurysm.   Electronically Signed   By: Jeannine Boga M.D.   On: 12/10/2013 00:33    Scheduled Meds: . aspirin EC  325 mg Oral Daily  . atorvastatin  80 mg Oral q1800  . calcium-vitamin D  1 tablet Oral BID WC  . Chlorhexidine Gluconate Cloth  6 each Topical Q0600  . metoprolol tartrate  12.5 mg Oral BID  . multivitamin with minerals  1 tablet Oral Daily  . mupirocin ointment  1 application Nasal BID  . omega-3 acid ethyl esters  1 g Oral Daily   Continuous Infusions:  Antibiotics Given (last 72 hours)   None      Principal Problem:   TIA (transient ischemic attack) Active Problems:   CAD (coronary artery disease), native coronary artery   Hypercholesteremia   Mild aortic insufficiency   Thrombocytopenia    Time spent: 13min    Domenic Polite  [facility] Pager [phone]. If 7PM-7AM, please  contact night-coverage at [email or web address], password [facility] 12/10/2013, 6:00 PM  LOS: 1 day

## 2013-12-10 NOTE — Progress Notes (Signed)
Critical Value rec'd from lab: + MRSA PCR. [facility] on call notified. Will continue to monitor.

## 2013-12-10 NOTE — Progress Notes (Signed)
Echocardiogram 2D Echocardiogram has been performed.  Tanner [name] 12/10/2013, 2:59 PM

## 2013-12-11 ENCOUNTER — Observation Stay (HOSPITAL_COMMUNITY): Payer: Medicare Other

## 2013-12-11 ENCOUNTER — Encounter (HOSPITAL_COMMUNITY): Payer: Self-pay | Admitting: Cardiology

## 2013-12-11 DIAGNOSIS — I420 Dilated cardiomyopathy: Secondary | ICD-10-CM

## 2013-12-11 DIAGNOSIS — G459 Transient cerebral ischemic attack, unspecified: Secondary | ICD-10-CM

## 2013-12-11 DIAGNOSIS — I428 Other cardiomyopathies: Secondary | ICD-10-CM

## 2013-12-11 LAB — BASIC METABOLIC PANEL
BUN: 18 mg/dL (ref 6–23)
CALCIUM: 9.1 mg/dL (ref 8.4–10.5)
CO2: 27 mEq/L (ref 19–32)
Chloride: 104 mEq/L (ref 96–112)
Creatinine, Ser: 0.83 mg/dL (ref 0.50–1.35)
GFR calc Af Amer: 89 mL/min — ABNORMAL LOW (ref >90)
GFR, EST NON AFRICAN AMERICAN: 77 mL/min — AB (ref >90)
Glucose, Bld: 107 mg/dL — ABNORMAL HIGH (ref 70–99)
POTASSIUM: 4.2 meq/L (ref 3.7–5.3)
Sodium: 141 mEq/L (ref 137–147)

## 2013-12-11 LAB — GLUCOSE, CAPILLARY: Glucose-Capillary: 111 mg/dL — ABNORMAL HIGH (ref 70–99)

## 2013-12-11 LAB — CBC
HCT: 37.1 % — ABNORMAL LOW (ref 39.0–52.0)
Hemoglobin: 12.5 g/dL — ABNORMAL LOW (ref 13.0–17.0)
MCH: 31.8 pg (ref 26.0–34.0)
MCHC: 33.7 g/dL (ref 30.0–36.0)
MCV: 94.4 fL (ref 78.0–100.0)
Platelets: 116 10*3/uL — ABNORMAL LOW (ref 150–400)
RBC: 3.93 MIL/uL — ABNORMAL LOW (ref 4.22–5.81)
RDW: 12.1 % (ref 11.5–15.5)
WBC: 4 10*3/uL (ref 4.0–10.5)

## 2013-12-11 LAB — TSH: TSH: 1.86 u[IU]/mL (ref 0.350–4.500)

## 2013-12-11 MED ORDER — CLOPIDOGREL BISULFATE 75 MG PO TABS
75.0000 mg | ORAL_TABLET | Freq: Every day | ORAL | Status: DC
  Administered 2013-12-11: 75 mg via ORAL
  Filled 2013-12-11 (×2): qty 1

## 2013-12-11 MED ORDER — ACETAMINOPHEN 325 MG PO TABS: 650.0000 mg | ORAL_TABLET | Freq: Four times a day (QID) | ORAL | Status: DC | PRN

## 2013-12-11 MED ORDER — IOHEXOL 350 MG/ML SOLN
100.0000 mL | Freq: Once | INTRAVENOUS | Status: AC | PRN
  Administered 2013-12-11: 100 mL via INTRAVENOUS

## 2013-12-11 MED ORDER — CLOPIDOGREL BISULFATE 75 MG PO TABS: 75.0000 mg | ORAL_TABLET | Freq: Every day | ORAL | Status: DC

## 2013-12-11 NOTE — Consult Note (Signed)
HPI: 80 year old male for evaluation of cardiomyopathy. Patient is status post coronary artery bypassing graft in 2012. He had a LIMA to the LAD and saphenous vein graft to the PDA. He did apparently had transient postoperative atrial fibrillation that resolved. Echocardiogram in July of 2014 showed an ejection fraction of 55-60%. There was mild aortic insufficiency. The patient was admitted on April 9 with complaints of transient confusion. This lasted approximately 6-8 hours. No associated dysarthria, loss of strength or sensation in his extremities, other neurological symptoms, palpitations, chest pain or dyspnea. He is being evaluated for possible TIA. Repeat echocardiogram shows an ejection fraction of 35-40% by report. There was moderate dilatation of the aorta and mild aortic insufficiency. There was mild mitral regurgitation. Cardiology asked to evaluate. Patient denies dyspnea on exertion, orthopnea, PND, pedal edema or exertional chest pain. No syncope.  Medications Prior to Admission  Medication Sig Dispense Refill  . aspirin 325 MG EC tablet Take 325 mg by mouth daily.      [name] Kitchen atorvastatin (LIPITOR) 80 MG tablet Take 80 mg by mouth daily.      . calcium-vitamin D (OSCAL WITH D) 500-200 MG-UNIT per tablet Take 1 tablet by mouth 2 (two) times daily.      . fish oil-omega-3 fatty acids 1000 MG capsule Take 2 g by mouth daily.      . metoprolol tartrate (LOPRESSOR) 25 MG tablet Take 12.5 mg by mouth 2 (two) times daily.      . Multiple Vitamin (MULTIVITAMIN) capsule Take 1 capsule by mouth daily.      . naproxen sodium (ANAPROX) 220 MG tablet Take 220 mg by mouth as needed.        No Known Allergies  Past Medical History  Diagnosis Date  . Osteoporosis   . Hypercholesteremia   . Tremors of nervous system     hands  . CAD (coronary artery disease), native coronary artery 1991; 2012    PCI - LAD; Cath in 2012 revealed severe RCA & LAD Diseaes -- CABG x 2  . AF (atrial  fibrillation) -- Post Operative   . Neuropathy of lower extremity     Past Surgical History  Procedure Laterality Date  . Coronary artery bypass graft  10/2010    LIMA-LAD, SVG-rPDA  . Doppler echocardiography  10/18/2004    EF 55-65%,BORDERLINE -enlarged aortic root,mild aortic insuff.,trace tricus.insuff.  . Nm myocar perf wall motion  07/24/1998    EF 71%  . Cardiac catheterization  10/17/2010    80%prox w/mild in-stent restenosis w/75% mid-LADstenosis aft the stent,multi severe stenoses RCAw/heavily calcified vessel,norm LV    History   Social History  . Marital Status: Married    Spouse Name: Stanton Kidney    Number of Children: 0  . Years of Education: BS, MS   Occupational History  . Retired    Social History Main Topics  . Smoking status: Former Smoker -- 1.00 packs/day for 5 years    Types: Cigarettes    Quit date: 02/08/1938  . Smokeless tobacco: Former Systems developer  . Alcohol Use: No  . Drug Use: No  . Sexual Activity: Not on file   Other Topics Concern  . Not on file   Social History Narrative   Pt lives at home with spouse.   Caffeine Use: very little    Family History  Problem Relation Age of Onset  . Heart Problems Mother   . Parkinson's disease Father   . Heart Problems Father  ROS:  no fevers or chills, productive cough, hemoptysis, dysphasia, odynophagia, melena, hematochezia, dysuria, hematuria, rash, seizure activity, orthopnea, PND, pedal edema, claudication. Remaining systems are negative.  Physical Exam:   Blood pressure 146/72, pulse 58, temperature 97.6 F (36.4 C), temperature source Oral, resp. rate 18, height 5' 10.25" (1.784 m), weight 155 lb 1.6 oz (70.353 kg), SpO2 98.00%.  General:  Well developed/well nourished in NAD Skin warm/dry Patient not depressed No peripheral clubbing Back-normal HEENT-normal/normal eyelids Neck supple/normal carotid upstroke bilaterally; no bruits; no JVD; no thyromegaly chest - CTA/ normal expansion,  Previous sternotomy CV - RRR/normal S1 and S2; no murmurs, rubs or gallops;  PMI nondisplaced Abdomen -NT/ND, no HSM, no mass, + bowel sounds, no bruit 2+ femoral pulses, no bruits Ext-no edema, chords, 2+ DP Neuro-grossly nonfocal  ECG Sinus bradycardia, incomplete right bundle branch block, nonspecific ST changes.  Results for orders placed during the hospital encounter of 12/09/13 (from the past 48 hour(s))  CBC WITH DIFFERENTIAL     Status: Abnormal   Collection Time    12/09/13  3:27 PM      Result Value Ref Range   WBC 5.4  4.0 - 10.5 K/uL   RBC 3.69 (*) 4.22 - 5.81 MIL/uL   Hemoglobin 12.0 (*) 13.0 - 17.0 g/dL   HCT 34.3 (*) 39.0 - 52.0 %   MCV 93.0  78.0 - 100.0 fL   MCH 32.5  26.0 - 34.0 pg   MCHC 35.0  30.0 - 36.0 g/dL   RDW 12.1  11.5 - 15.5 %   Platelets 118 (*) 150 - 400 K/uL   Comment: REPEATED TO VERIFY     SPECIMEN CHECKED FOR CLOTS     PLATELET COUNT CONFIRMED BY SMEAR   Neutrophils Relative % 80 (*) 43 - 77 %   Neutro Abs 4.3  1.7 - 7.7 K/uL   Lymphocytes Relative 14  12 - 46 %   Lymphs Abs 0.8  0.7 - 4.0 K/uL   Monocytes Relative 5  3 - 12 %   Monocytes Absolute 0.3  0.1 - 1.0 K/uL   Eosinophils Relative 1  0 - 5 %   Eosinophils Absolute 0.1  0.0 - 0.7 K/uL   Basophils Relative 0  0 - 1 %   Basophils Absolute 0.0  0.0 - 0.1 K/uL  BASIC METABOLIC PANEL     Status: Abnormal   Collection Time    12/09/13  3:27 PM      Result Value Ref Range   Sodium 137  137 - 147 mEq/L   Potassium 4.1  3.7 - 5.3 mEq/L   Chloride 100  96 - 112 mEq/L   CO2 25  19 - 32 mEq/L   Glucose, Bld 174 (*) 70 - 99 mg/dL   BUN 25 (*) 6 - 23 mg/dL   Creatinine, Ser 0.93  0.50 - 1.35 mg/dL   Calcium 9.5  8.4 - 10.5 mg/dL   GFR calc non Af Amer 73 (*) >90 mL/min   GFR calc Af Amer 85 (*) >90 mL/min   Comment: (NOTE)     The eGFR has been calculated using the CKD EPI equation.     This calculation has not been validated in all clinical situations.     eGFR's persistently <90  mL/min signify possible Chronic Kidney     Disease.  Randolm Idol, ED     Status: None   Collection Time    12/09/13  3:37 PM  Result Value Ref Range   Troponin i, poc 0.01  0.00 - 0.08 ng/mL   Comment 3            Comment: Due to the release kinetics of cTnI,     a negative result within the first hours     of the onset of symptoms does not rule out     myocardial infarction with certainty.     If myocardial infarction is still suspected,     repeat the test at appropriate intervals.  URINALYSIS, ROUTINE W REFLEX MICROSCOPIC     Status: None   Collection Time    12/09/13  3:44 PM      Result Value Ref Range   Color, Urine YELLOW  YELLOW   APPearance CLEAR  CLEAR   Specific Gravity, Urine 1.015  1.005 - 1.030   pH 6.0  5.0 - 8.0   Glucose, UA NEGATIVE  NEGATIVE mg/dL   Hgb urine dipstick NEGATIVE  NEGATIVE   Bilirubin Urine NEGATIVE  NEGATIVE   Ketones, ur NEGATIVE  NEGATIVE mg/dL   Protein, ur NEGATIVE  NEGATIVE mg/dL   Urobilinogen, UA 0.2  0.0 - 1.0 mg/dL   Nitrite NEGATIVE  NEGATIVE   Leukocytes, UA NEGATIVE  NEGATIVE   Comment: MICROSCOPIC NOT DONE ON URINES WITH NEGATIVE PROTEIN, BLOOD, LEUKOCYTES, NITRITE, OR GLUCOSE <1000 mg/dL.  URINE RAPID DRUG SCREEN (HOSP PERFORMED)     Status: None   Collection Time    12/09/13  6:21 PM      Result Value Ref Range   Opiates NONE DETECTED  NONE DETECTED   Cocaine NONE DETECTED  NONE DETECTED   Benzodiazepines NONE DETECTED  NONE DETECTED   Amphetamines NONE DETECTED  NONE DETECTED   Tetrahydrocannabinol NONE DETECTED  NONE DETECTED   Barbiturates NONE DETECTED  NONE DETECTED   Comment:            DRUG SCREEN FOR MEDICAL PURPOSES     ONLY.  IF CONFIRMATION IS NEEDED     FOR ANY PURPOSE, NOTIFY LAB     WITHIN 5 DAYS.                LOWEST DETECTABLE LIMITS     FOR URINE DRUG SCREEN     Drug Class       Cutoff (ng/mL)     Amphetamine      1000     Barbiturate      200     Benzodiazepine   696     Tricyclics        295     Opiates          300     Cocaine          300     THC              50  CBG MONITORING, ED     Status: None   Collection Time    12/09/13  6:38 PM      Result Value Ref Range   Glucose-Capillary 99  70 - 99 mg/dL  GLUCOSE, CAPILLARY     Status: None   Collection Time    12/09/13  9:05 PM      Result Value Ref Range   Glucose-Capillary 98  70 - 99 mg/dL  MRSA PCR SCREENING     Status: Abnormal   Collection Time    12/09/13 11:30 PM      Result Value Ref Range   MRSA  by PCR POSITIVE (*) NEGATIVE   Comment:            The GeneXpert MRSA Assay (FDA     approved for NASAL specimens     only), is one component of a     comprehensive MRSA colonization     surveillance program. It is not     intended to diagnose MRSA     infection nor to guide or     monitor treatment for     MRSA infections.     RESULT CALLED TO, READ BACK BY AND VERIFIED WITH:     KELSEY RN/4W AT 0200 ON [id number] BY DLONG  HEMOGLOBIN A1C     Status: Abnormal   Collection Time    12/10/13  4:25 AM      Result Value Ref Range   Hemoglobin A1C 6.1 (*) <5.7 %   Comment: (NOTE)                                                                               According to the ADA Clinical Practice Recommendations for 2011, when     HbA1c is used as a screening test:      >=6.5%   Diagnostic of Diabetes Mellitus               (if abnormal result is confirmed)     5.7-6.4%   Increased risk of developing Diabetes Mellitus     References:Diagnosis and Classification of Diabetes Mellitus,Diabetes     JASN,0539,76(BHALP 1):S62-S69 and Standards of Medical Care in             Diabetes - 2011,Diabetes Care,2011,34 (Suppl 1):S11-S61.   Mean Plasma Glucose 128 (*) <117 mg/dL   Comment: Performed at [facility]  CBC     Status: Abnormal   Collection Time    12/10/13  4:25 AM      Result Value Ref Range   WBC 4.9  4.0 - 10.5 K/uL   RBC 3.99 (*) 4.22 - 5.81 MIL/uL   Hemoglobin 12.7 (*) 13.0 - 17.0 g/dL   HCT  37.1 (*) 39.0 - 52.0 %   MCV 93.0  78.0 - 100.0 fL   MCH 31.8  26.0 - 34.0 pg   MCHC 34.2  30.0 - 36.0 g/dL   RDW 12.0  11.5 - 15.5 %   Platelets 130 (*) 150 - 400 K/uL  BASIC METABOLIC PANEL     Status: Abnormal   Collection Time    12/10/13  4:25 AM      Result Value Ref Range   Sodium 141  137 - 147 mEq/L   Potassium 4.0  3.7 - 5.3 mEq/L   Chloride 104  96 - 112 mEq/L   CO2 26  19 - 32 mEq/L   Glucose, Bld 91  70 - 99 mg/dL   BUN 21  6 - 23 mg/dL   Creatinine, Ser 0.86  0.50 - 1.35 mg/dL   Calcium 9.4  8.4 - 10.5 mg/dL   GFR calc non Af Amer 76 (*) >90 mL/min   GFR calc Af Amer 88 (*) >90 mL/min   Comment: (NOTE)     The eGFR has  been calculated using the CKD EPI equation.     This calculation has not been validated in all clinical situations.     eGFR's persistently <90 mL/min signify possible Chronic Kidney     Disease.  LIPID PANEL     Status: None   Collection Time    12/10/13  4:28 AM      Result Value Ref Range   Cholesterol 131  0 - 200 mg/dL   Triglycerides 98  <150 mg/dL   HDL 51  >39 mg/dL   Total CHOL/HDL Ratio 2.6     VLDL 20  0 - 40 mg/dL   LDL Cholesterol 60  0 - 99 mg/dL   Comment:            Total Cholesterol/HDL:CHD Risk     Coronary Heart Disease Risk Table                         Men   Women      1/2 Average Risk   3.4   3.3      Average Risk       5.0   4.4      2 X Average Risk   9.6   7.1      3 X Average Risk  23.4   11.0                Use the calculated Patient Ratio     above and the CHD Risk Table     to determine the patient's CHD Risk.                ATP III CLASSIFICATION (LDL):      <100     mg/dL   Optimal      100-129  mg/dL   Near or Above                        Optimal      130-159  mg/dL   Borderline      160-189  mg/dL   High      >190     mg/dL   Very High     Performed at [facility], CAPILLARY     Status: None   Collection Time    12/10/13  7:14 AM      Result Value Ref Range   Glucose-Capillary 97   70 - 99 mg/dL   Comment 1 Documented in Chart     Comment 2 Notify RN    GLUCOSE, CAPILLARY     Status: None   Collection Time    12/10/13 11:38 AM      Result Value Ref Range   Glucose-Capillary 91  70 - 99 mg/dL  GLUCOSE, CAPILLARY     Status: Abnormal   Collection Time    12/10/13  9:56 PM      Result Value Ref Range   Glucose-Capillary 134 (*) 70 - 99 mg/dL   Comment 1 Documented in Chart     Comment 2 Notify RN    CBC     Status: Abnormal   Collection Time    12/11/13  5:44 AM      Result Value Ref Range   WBC 4.0  4.0 - 10.5 K/uL   RBC 3.93 (*) 4.22 - 5.81 MIL/uL   Hemoglobin 12.5 (*) 13.0 - 17.0 g/dL   HCT 37.1 (*) 39.0 - 52.0 %  MCV 94.4  78.0 - 100.0 fL   MCH 31.8  26.0 - 34.0 pg   MCHC 33.7  30.0 - 36.0 g/dL   RDW 12.1  11.5 - 15.5 %   Platelets 116 (*) 150 - 400 K/uL   Comment: SPECIMEN CHECKED FOR CLOTS     REPEATED TO VERIFY     PLATELET COUNT CONFIRMED BY SMEAR  BASIC METABOLIC PANEL     Status: Abnormal   Collection Time    12/11/13  5:44 AM      Result Value Ref Range   Sodium 141  137 - 147 mEq/L   Potassium 4.2  3.7 - 5.3 mEq/L   Chloride 104  96 - 112 mEq/L   CO2 27  19 - 32 mEq/L   Glucose, Bld 107 (*) 70 - 99 mg/dL   BUN 18  6 - 23 mg/dL   Creatinine, Ser 0.83  0.50 - 1.35 mg/dL   Calcium 9.1  8.4 - 10.5 mg/dL   GFR calc non Af Amer 77 (*) >90 mL/min   GFR calc Af Amer 89 (*) >90 mL/min   Comment: (NOTE)     The eGFR has been calculated using the CKD EPI equation.     This calculation has not been validated in all clinical situations.     eGFR's persistently <90 mL/min signify possible Chronic Kidney     Disease.  GLUCOSE, CAPILLARY     Status: Abnormal   Collection Time    12/11/13  7:13 AM      Result Value Ref Range   Glucose-Capillary 111 (*) 70 - 99 mg/dL    Ct Head Wo Contrast  12/09/2013   CLINICAL DATA:  Confusion and dizziness  EXAM: CT HEAD WITHOUT CONTRAST  TECHNIQUE: Contiguous axial images were obtained from the base of the  skull through the vertex without intravenous contrast. Study was obtained within 24 hr of patient's arrival at the emergency department.  COMPARISON:  None.  FINDINGS: There is mild diffuse atrophy. There is no mass, hemorrhage, extra-axial fluid collection, or midline shift. There is patchy small vessel disease in the centra semiovale bilaterally. Elsewhere, gray-white compartments appear normal. There is no demonstrable acute infarct.  Bony calvarium appears intact. Visualized mastoid air cells are clear.  IMPRESSION: Atrophy with periventricular small vessel disease. No intracranial mass, hemorrhage, or acute appearing infarct.   Electronically Signed   By: Lowella Grip M.D.   On: 12/09/2013 15:43   Mr Jodene Nam Head Wo Contrast  12/10/2013   CLINICAL DATA:  TIA.  EXAM: MRI HEAD WITHOUT CONTRAST  MRA HEAD WITHOUT CONTRAST  TECHNIQUE: Multiplanar, multiecho pulse sequences of the brain and surrounding structures were obtained without intravenous contrast. Angiographic images of the head were obtained using MRA technique without contrast.  COMPARISON:  Prior CT performed earlier on the same day  FINDINGS: MRI HEAD FINDINGS  Mild cerebral atrophy noted. Scattered and confluent T2/FLAIR hyperintensity within the periventricular and deep white matter both cerebral hemispheres is most consistent with moderate chronic small vessel ischemic changes. Focal encephalomalacia within the right frontal lobe likely represents a remote infarct (series 6, image 16). Single subcentimeter hypo intense focus seen within the right parietal lobe on gradient echo sequence likely represents a small chronic microhemorrhage.  No focal parenchymal signal abnormality is identified. No mass lesion, midline shift, or extra-axial fluid collection. Ventricles are normal in size without evidence of hydrocephalus.  No diffusion-weighted signal abnormality is identified to suggest acute intracranial infarct. Gray-white matter differentiation  is  maintained. Normal flow voids are seen within the intracranial vasculature. No intracranial hemorrhage identified.  The cervicomedullary junction is normal. Pituitary gland is within normal limits. Pituitary stalk is midline. The globes and optic nerves demonstrate a normal appearance with normal signal intensity.  The bone marrow signal intensity is normal. Calvarium is intact. Visualized upper cervical spine is within normal limits.  Scalp soft tissues are unremarkable.  Paranasal sinuses are clear.  No mastoid effusion.  MRA HEAD FINDINGS  The visualized portions of the distal cervical internal carotid arteries are widely patent with antegrade flow. The petrous, cavernous, and supra clinoid segments are symmetric in caliber bilaterally. A1 segments, anterior communicating artery, and anterior cerebral arteries are widely patent. Middle cerebral arteries are widely patent with antegrade flow without high-grade flow-limiting stenosis or proximal branch occlusion. Distal MCA branches are symmetric bilaterally. No intracranial aneurysm within the anterior circulation.  The vertebral arteries are widely patent with antegrade flow. The left vertebral artery is dominant. The posterior inferior cerebral arteries are normal. Vertebrobasilar junction and basilar artery are widely patent with antegrade flow without evidence of basilar tip stenosis or aneurysm. Posterior cerebral arteries are normal bilaterally. The superior cerebellar arteries and anterior inferior cerebellar arteries are widely patent bilaterally. No intracranial aneurysm within the posterior circulation.  IMPRESSION: MRI BRAIN:  1. No acute intracranial infarct or other abnormality identified. 2. Small remote right frontal lobe infarct as above. 3. Mild atrophy with moderate chronic microvascular ischemic disease.  MRA BRAIN:  Normal MRA of the brain without evidence of high-grade flow-limiting stenosis, proximal branch occlusion, or other acute  abnormality. No intracranial aneurysm.   Electronically Signed   By: Jeannine Boga M.D.   On: 12/10/2013 00:33   Mri Brain Without Contrast  12/10/2013   CLINICAL DATA:  TIA.  EXAM: MRI HEAD WITHOUT CONTRAST  MRA HEAD WITHOUT CONTRAST  TECHNIQUE: Multiplanar, multiecho pulse sequences of the brain and surrounding structures were obtained without intravenous contrast. Angiographic images of the head were obtained using MRA technique without contrast.  COMPARISON:  Prior CT performed earlier on the same day  FINDINGS: MRI HEAD FINDINGS  Mild cerebral atrophy noted. Scattered and confluent T2/FLAIR hyperintensity within the periventricular and deep white matter both cerebral hemispheres is most consistent with moderate chronic small vessel ischemic changes. Focal encephalomalacia within the right frontal lobe likely represents a remote infarct (series 6, image 16). Single subcentimeter hypo intense focus seen within the right parietal lobe on gradient echo sequence likely represents a small chronic microhemorrhage.  No focal parenchymal signal abnormality is identified. No mass lesion, midline shift, or extra-axial fluid collection. Ventricles are normal in size without evidence of hydrocephalus.  No diffusion-weighted signal abnormality is identified to suggest acute intracranial infarct. Gray-white matter differentiation is maintained. Normal flow voids are seen within the intracranial vasculature. No intracranial hemorrhage identified.  The cervicomedullary junction is normal. Pituitary gland is within normal limits. Pituitary stalk is midline. The globes and optic nerves demonstrate a normal appearance with normal signal intensity.  The bone marrow signal intensity is normal. Calvarium is intact. Visualized upper cervical spine is within normal limits.  Scalp soft tissues are unremarkable.  Paranasal sinuses are clear.  No mastoid effusion.  MRA HEAD FINDINGS  The visualized portions of the distal  cervical internal carotid arteries are widely patent with antegrade flow. The petrous, cavernous, and supra clinoid segments are symmetric in caliber bilaterally. A1 segments, anterior communicating artery, and anterior cerebral arteries are widely patent. Middle cerebral  arteries are widely patent with antegrade flow without high-grade flow-limiting stenosis or proximal branch occlusion. Distal MCA branches are symmetric bilaterally. No intracranial aneurysm within the anterior circulation.  The vertebral arteries are widely patent with antegrade flow. The left vertebral artery is dominant. The posterior inferior cerebral arteries are normal. Vertebrobasilar junction and basilar artery are widely patent with antegrade flow without evidence of basilar tip stenosis or aneurysm. Posterior cerebral arteries are normal bilaterally. The superior cerebellar arteries and anterior inferior cerebellar arteries are widely patent bilaterally. No intracranial aneurysm within the posterior circulation.  IMPRESSION: MRI BRAIN:  1. No acute intracranial infarct or other abnormality identified. 2. Small remote right frontal lobe infarct as above. 3. Mild atrophy with moderate chronic microvascular ischemic disease.  MRA BRAIN:  Normal MRA of the brain without evidence of high-grade flow-limiting stenosis, proximal branch occlusion, or other acute abnormality. No intracranial aneurysm.   Electronically Signed   By: Jeannine Boga M.D.   On: 12/10/2013 00:33    Assessment/Plan 1 cardiomyopathy-patient's LV function is newly decreased based on report. Etiology unclear. Check TSH. Would arrange outpatient nuclear study to verify LV function and to exclude ischemia. If no ischemia would treat medically. Continue metoprolol at present dose. If LV function reduced on nuclear study would add ACE inhibitor as an outpatient. 2 dilated aorta-moderately dilated on echocardiogram. Check CTA to further assess. 3 question TIA-not  clear to me that symptoms are a TIA. Patient states he had transient global amnesia in the past and this was similar. Would ask neurology to evaluate. If they feel this was a TIA we could proceed with transesophageal echocardiogram to further assess. There is no indication of recurrent atrial fibrillation after review of telemetry. 4 coronary artery disease-continue aspirin and statin. 5 hyperlipidemia-continue statin.  Kirk Ruths MD 12/11/2013, 7:42 AM

## 2013-12-11 NOTE — Discharge Summary (Signed)
Physician Discharge Summary  Tanner [name] [id number] DOB: 07/30/1924 DOA: 12/09/2013  PCP: Thressa Sheller, MD  Admit date: 12/09/2013 Discharge date: 12/11/2013  Time spent: 45 minutes  Recommendations for Outpatient Follow-up:  1. Dr.Harding in 2weeks 2. Outpatient stress test per Cardiology  3.   Will need ACE inhibitor started outpatient if low EF confirmed on Myoview  Discharge Diagnoses:  Principal Problem:   TIA (transient ischemic attack) Active Problems:   CAD (coronary artery disease), native coronary artery   Hypercholesteremia   Mild aortic insufficiency   Thrombocytopenia   Congestive dilated cardiomyopathy   Discharge Condition: stable  Diet recommendation: low sodium  Filed Weights   12/09/13 1930  Weight: 70.353 kg (155 lb 1.6 oz)    History of present illness:  Tanner [name] is a 80 y.o. male very active gentleman who has a past medical history significant for coronary artery disease status post CABG in 2012, hyperlipidemia, A. fib in the postoperative setting, presents to the emergency room with the chief complaint of dizziness, poor balance, difficulties with word finding and confusion that started today. He was working on a rental house fixing a light fixture when he began to feel this way. He said that it lasted for about 6 hours, including in the ED, but now all his symptoms have resolved. Patient denies any chest pain, palpitations, shortness of breath. He has no fever or chills. He denies any lightheadedness or dizziness. He has had 2 similar episodes in the remote past, and was told he had global transient amnesia at that time. He denies any abdominal pain, nausea, vomiting or diarrhea. In the emergency room, patient was stable vital signs, mildly bradycardic into the mild 50s (patient states that this is his baseline).    Hospital Course:  TIA  - I called and d/w Neurology, His symptoms of dizziness, staggering gait, Difficulty with word finding,  especially peoples names resolved in 6 hours, per Dr.Reynolds this is unlikely to be TGA and most likely a TIA and recommended Plavix  -MRI/MRA negative  -Carotid duplex/ECHO completed, drop in EF from 55% to 35% -changed from ASA to Plavix per Neurology recommendations -LDL <70, continue statin -Hbaic 6.1  Coronary artery disease  -continue metoprolol/statin  -change from ASA to plavix due to TIA -ECHo with drop in EF from 55-60% down to 35% -seen by cardiology Dr.Crenshaw in consultation, he recommended outpatient stress tst and start low dose ACE outpatient if EF confirmed on stress test too  Hyperlipidemia - continue statin. LDL<70   H/o A. Fib post op during CABG  -in NSR now, rate controlled, on metoprolol.  -no evidence of Afib  Thrombocytopenia  - chronic  -stable  dilated aorta-moderately dilated on echocardiogram, Dr.Crenshaw ordered CTA to further assess this which showed mild fusiform ectasis of aortic root and prox ascending aorta   Consultations:  Cardiology Dr.Crenshaw  D/w Dr.Reynolds Neurology via telephone  Discharge Exam: Filed Vitals:   12/11/13 0559  BP: 146/72  Pulse: 58  Temp: 97.6 F (36.4 C)  Resp: 18    General: AAOx3 Cardiovascular: S1S2/RRR Respiratory: CTAB  Discharge Instructions You were cared for by a hospitalist during your hospital stay. If you have any questions about your discharge medications or the care you received while you were in the hospital after you are discharged, you can call the unit and asked to speak with the hospitalist on call if the hospitalist that took care of you is not available. Once you are discharged, your primary care  physician will handle any further medical issues. Please note that NO REFILLS for any discharge medications will be authorized once you are discharged, as it is imperative that you return to your primary care physician (or establish a relationship with a primary care physician if you do not have  one) for your aftercare needs so that they can reassess your need for medications and monitor your lab values.  Discharge Orders   Future Appointments Provider Department Dept Phone   12/23/2013 11:30 AM Star Age, MD [facility] [phone]   02/02/2014 3:15 PM [name], MD [facility] [phone]   Future Orders Complete By Expires   Diet - low sodium heart healthy  As directed    Diet - low sodium heart healthy  As directed    Increase activity slowly  As directed    Increase activity slowly  As directed        Medication List    STOP taking these medications       aspirin 325 MG EC tablet     naproxen sodium 220 MG tablet  Commonly known as:  ANAPROX      TAKE these medications       acetaminophen 325 MG tablet  Commonly known as:  TYLENOL  Take 2 tablets (650 mg total) by mouth every 6 (six) hours as needed for mild pain or headache.     atorvastatin 80 MG tablet  Commonly known as:  LIPITOR  Take 80 mg by mouth daily.     calcium-vitamin D 500-200 MG-UNIT per tablet  Commonly known as:  OSCAL WITH D  Take 1 tablet by mouth 2 (two) times daily.     clopidogrel 75 MG tablet  Commonly known as:  PLAVIX  Take 1 tablet (75 mg total) by mouth daily with breakfast.     fish oil-omega-3 fatty acids 1000 MG capsule  Take 2 g by mouth daily.     metoprolol tartrate 25 MG tablet  Commonly known as:  LOPRESSOR  Take 12.5 mg by mouth 2 (two) times daily.     multivitamin capsule  Take 1 capsule by mouth daily.       No Known Allergies     Follow-up Information   Follow up with HARDING,DAVID W, MD. Schedule an appointment as soon as possible for a visit in 2 weeks.   Specialty:  Cardiology   Contact information:   [address] [place] [address] [phone]       Follow up with Forbes Cellar, MD. Schedule an appointment as soon as possible for a visit in 1 month.   Specialties:  Neurology,  Radiology   Contact information:   [address] [place] [address] [phone]        The results of significant diagnostics from this hospitalization (including imaging, microbiology, ancillary and laboratory) are listed below for reference.    Significant Diagnostic Studies: Ct Head Wo Contrast  12/09/2013   CLINICAL DATA:  Confusion and dizziness  EXAM: CT HEAD WITHOUT CONTRAST  TECHNIQUE: Contiguous axial images were obtained from the base of the skull through the vertex without intravenous contrast. Study was obtained within 24 hr of patient's arrival at the emergency department.  COMPARISON:  None.  FINDINGS: There is mild diffuse atrophy. There is no mass, hemorrhage, extra-axial fluid collection, or midline shift. There is patchy small vessel disease in the centra semiovale bilaterally. Elsewhere, gray-white compartments appear normal. There is no demonstrable acute  infarct.  Bony calvarium appears intact. Visualized mastoid air cells are clear.  IMPRESSION: Atrophy with periventricular small vessel disease. No intracranial mass, hemorrhage, or acute appearing infarct.   Electronically Signed   By: Bretta Bang M.D.   On: 12/09/2013 15:43   Mr Maxine Glenn Head Wo Contrast  12/10/2013   CLINICAL DATA:  TIA.  EXAM: MRI HEAD WITHOUT CONTRAST  MRA HEAD WITHOUT CONTRAST  TECHNIQUE: Multiplanar, multiecho pulse sequences of the brain and surrounding structures were obtained without intravenous contrast. Angiographic images of the head were obtained using MRA technique without contrast.  COMPARISON:  Prior CT performed earlier on the same day  FINDINGS: MRI HEAD FINDINGS  Mild cerebral atrophy noted. Scattered and confluent T2/FLAIR hyperintensity within the periventricular and deep white matter both cerebral hemispheres is most consistent with moderate chronic small vessel ischemic changes. Focal encephalomalacia within the right frontal lobe likely represents a remote infarct (series  6, image 16). Single subcentimeter hypo intense focus seen within the right parietal lobe on gradient echo sequence likely represents a small chronic microhemorrhage.  No focal parenchymal signal abnormality is identified. No mass lesion, midline shift, or extra-axial fluid collection. Ventricles are normal in size without evidence of hydrocephalus.  No diffusion-weighted signal abnormality is identified to suggest acute intracranial infarct. Gray-white matter differentiation is maintained. Normal flow voids are seen within the intracranial vasculature. No intracranial hemorrhage identified.  The cervicomedullary junction is normal. Pituitary gland is within normal limits. Pituitary stalk is midline. The globes and optic nerves demonstrate a normal appearance with normal signal intensity.  The bone marrow signal intensity is normal. Calvarium is intact. Visualized upper cervical spine is within normal limits.  Scalp soft tissues are unremarkable.  Paranasal sinuses are clear.  No mastoid effusion.  MRA HEAD FINDINGS  The visualized portions of the distal cervical internal carotid arteries are widely patent with antegrade flow. The petrous, cavernous, and supra clinoid segments are symmetric in caliber bilaterally. A1 segments, anterior communicating artery, and anterior cerebral arteries are widely patent. Middle cerebral arteries are widely patent with antegrade flow without high-grade flow-limiting stenosis or proximal branch occlusion. Distal MCA branches are symmetric bilaterally. No intracranial aneurysm within the anterior circulation.  The vertebral arteries are widely patent with antegrade flow. The left vertebral artery is dominant. The posterior inferior cerebral arteries are normal. Vertebrobasilar junction and basilar artery are widely patent with antegrade flow without evidence of basilar tip stenosis or aneurysm. Posterior cerebral arteries are normal bilaterally. The superior cerebellar arteries and  anterior inferior cerebellar arteries are widely patent bilaterally. No intracranial aneurysm within the posterior circulation.  IMPRESSION: MRI BRAIN:  1. No acute intracranial infarct or other abnormality identified. 2. Small remote right frontal lobe infarct as above. 3. Mild atrophy with moderate chronic microvascular ischemic disease.  MRA BRAIN:  Normal MRA of the brain without evidence of high-grade flow-limiting stenosis, proximal branch occlusion, or other acute abnormality. No intracranial aneurysm.   Electronically Signed   By: Rise Mu M.D.   On: 12/10/2013 00:33   Mri Brain Without Contrast  12/10/2013   CLINICAL DATA:  TIA.  EXAM: MRI HEAD WITHOUT CONTRAST  MRA HEAD WITHOUT CONTRAST  TECHNIQUE: Multiplanar, multiecho pulse sequences of the brain and surrounding structures were obtained without intravenous contrast. Angiographic images of the head were obtained using MRA technique without contrast.  COMPARISON:  Prior CT performed earlier on the same day  FINDINGS: MRI HEAD FINDINGS  Mild cerebral atrophy noted. Scattered and confluent T2/FLAIR hyperintensity within  the periventricular and deep white matter both cerebral hemispheres is most consistent with moderate chronic small vessel ischemic changes. Focal encephalomalacia within the right frontal lobe likely represents a remote infarct (series 6, image 16). Single subcentimeter hypo intense focus seen within the right parietal lobe on gradient echo sequence likely represents a small chronic microhemorrhage.  No focal parenchymal signal abnormality is identified. No mass lesion, midline shift, or extra-axial fluid collection. Ventricles are normal in size without evidence of hydrocephalus.  No diffusion-weighted signal abnormality is identified to suggest acute intracranial infarct. Gray-white matter differentiation is maintained. Normal flow voids are seen within the intracranial vasculature. No intracranial hemorrhage identified.   The cervicomedullary junction is normal. Pituitary gland is within normal limits. Pituitary stalk is midline. The globes and optic nerves demonstrate a normal appearance with normal signal intensity.  The bone marrow signal intensity is normal. Calvarium is intact. Visualized upper cervical spine is within normal limits.  Scalp soft tissues are unremarkable.  Paranasal sinuses are clear.  No mastoid effusion.  MRA HEAD FINDINGS  The visualized portions of the distal cervical internal carotid arteries are widely patent with antegrade flow. The petrous, cavernous, and supra clinoid segments are symmetric in caliber bilaterally. A1 segments, anterior communicating artery, and anterior cerebral arteries are widely patent. Middle cerebral arteries are widely patent with antegrade flow without high-grade flow-limiting stenosis or proximal branch occlusion. Distal MCA branches are symmetric bilaterally. No intracranial aneurysm within the anterior circulation.  The vertebral arteries are widely patent with antegrade flow. The left vertebral artery is dominant. The posterior inferior cerebral arteries are normal. Vertebrobasilar junction and basilar artery are widely patent with antegrade flow without evidence of basilar tip stenosis or aneurysm. Posterior cerebral arteries are normal bilaterally. The superior cerebellar arteries and anterior inferior cerebellar arteries are widely patent bilaterally. No intracranial aneurysm within the posterior circulation.  IMPRESSION: MRI BRAIN:  1. No acute intracranial infarct or other abnormality identified. 2. Small remote right frontal lobe infarct as above. 3. Mild atrophy with moderate chronic microvascular ischemic disease.  MRA BRAIN:  Normal MRA of the brain without evidence of high-grade flow-limiting stenosis, proximal branch occlusion, or other acute abnormality. No intracranial aneurysm.   Electronically Signed   By: Jeannine Boga M.D.   On: 12/10/2013 00:33   Ct  Angio Chest Aorta W/cm &/or Wo/cm  12/11/2013   CLINICAL DATA:  Dilated aorta on recent cardiac echo with associated decreased ejection fraction - evaluate the thoracic aorta  EXAM: CT ANGIOGRAPHY CHEST WITH CONTRAST  TECHNIQUE: Multidetector CT imaging of the chest was performed using the standard protocol during bolus administration of intravenous contrast. Multiplanar CT image reconstructions and MIPs were obtained to evaluate the vascular anatomy.  CONTRAST:  1107mL OMNIPAQUE IOHEXOL 350 MG/ML SOLN  COMPARISON:  DG CHEST 2 VIEW dated 11/12/2010; DG CHEST 1V PORT dated 10/18/2010  FINDINGS: Vascular Findings:  Evaluation of the thoracic aorta is degraded secondary to suboptimal vessel opacification  There is mild fusiform dilatation of the aortic root and proximal ascending thoracic aorta with measurements as follows. The thoracic aorta tapers to a normal caliber at the level of the aortic aortic arch. Scattered atherosclerotic plaque within the thoracic aorta, not resulting in hemodynamically significant stenosis. Conventional configuration of the aortic arch. The branch vessels of the aortic arch are widely patent throughout their imaged course. No definite thoracic aortic dissection or periaortic stranding.  Post median sternotomy and CABG. Normal heart size. Calcifications within the native coronary arteries. No pericardial effusion.  No pulmonary embolism.  Normal caliber of the main pulmonary artery.  -------------------------------------------------------------  Thoracic aortic measurements:  Sinuses of Valsalva:  47 mm as measuring greatest oblique coronal dimension  Sinotubular junction  37 mm as measured in greatest oblique coronal dimension.  Proximal ascending aorta  40 mm as measured in greatest oblique axial dimension at the level of the main pulmonary artery an approximately 40 mm in greatest oblique coronal dimension (image 28, series 602).  Aortic arch aorta  31 mm as measured in greatest oblique  sagittal dimension.  Proximal descending thoracic aorta  31 mm as measured in greatest oblique axial dimension at the level of the main pulmonary artery.  Distal descending thoracic aorta  29 mm as measured in greatest oblique axial dimension at the level of the diaphragmatic hiatus.  Review of the MIP images confirms the above findings.  -------------------------------------------------------------  Non-Vascular Findings:  There is grossly symmetric biapical pleural parenchymal thickening. There is an indeterminate approximately 5 mm noncalcified nodule within the right lung apex (image 12, series 8). There is an additional punctate (2 mm) noncalcified nodule within the subpleural aspect of the left lower lobe (image 60, series 8).  There is a punctate granuloma noted within the left upper lobe (image 47, series 8) with associated partially calcified AP window lymph node. Scattered shotty mediastinal lymph nodes are individually not enlarged by size criteria. No mediastinal, hilar or axillary lymphadenopathy.  No focal airspace opacities. No pleural effusion or pneumothorax. The central pulmonary airways are widely patent.  Early arterial phase evaluation of the upper abdomen demonstrates multiple granulomas within the spleen.  There is an age-indeterminate moderate (approximately 50%) compression deformity of the T7 vertebral body with associated mild focal kyphosis at this location. This finding is without associated fracture line, retropulsion or paraspinal hematoma. Severe degenerate change of the right glenohumeral joint  Regional soft tissues appear normal.  IMPRESSION: 1. Mild fusiform ectasia of the aortic root (47 mm) and proximal ascending thoracic aorta (40 mm) without evidence of dissection. 2. Sequela of prior granulomatous infection as above. 3. Punctate noncalcified nodules within the right upper and left lower lobes, largest of which within the right lung apex measures approximately 5 mm in  diameter. If the patient is at high risk for bronchogenic carcinoma, follow-up chest CT at 6-12 months is recommended. If the patient is at low risk for bronchogenic carcinoma, follow-up chest CT at 12 months is recommended. This recommendation follows the consensus statement: Guidelines for Management of Small Pulmonary Nodules Detected on CT Scans: A Statement from the [employer] as published in Radiology 2005;237:395-400. 4. Age-indeterminate (though favored to be chronic) moderate (approximately 50%) compression deformity of the T7 vertebral body - correlation for point tenderness at this location is recommended.   Electronically Signed   By: Sandi Mariscal M.D.   On: 12/11/2013 09:14    Microbiology: Recent Results (from the past 240 hour(s))  MRSA PCR SCREENING     Status: Abnormal   Collection Time    12/09/13 11:30 PM      Result Value Ref Range Status   MRSA by PCR POSITIVE (*) NEGATIVE Final   Comment:            The GeneXpert MRSA Assay (FDA     approved for NASAL specimens     only), is one component of a     comprehensive MRSA colonization     surveillance program. It is not     intended to diagnose MRSA  infection nor to guide or     monitor treatment for     MRSA infections.     RESULT CALLED TO, READ BACK BY AND VERIFIED WITH:     KELSEY RN/4W AT 0200 ON [id number] BY DLONG     Labs: Basic Metabolic Panel:  Recent Labs Lab 12/09/13 1527 12/10/13 0425 12/11/13 0544  NA 137 141 141  K 4.1 4.0 4.2  CL 100 104 104  CO2 25 26 27   GLUCOSE 174* 91 107*  BUN 25* 21 18  CREATININE 0.93 0.86 0.83  CALCIUM 9.5 9.4 9.1   Liver Function Tests: No results found for this basename: AST, ALT, ALKPHOS, BILITOT, PROT, ALBUMIN,  in the last 168 hours No results found for this basename: LIPASE, AMYLASE,  in the last 168 hours No results found for this basename: AMMONIA,  in the last 168 hours CBC:  Recent Labs Lab 12/09/13 1527 12/10/13 0425 12/11/13 0544  WBC 5.4  4.9 4.0  NEUTROABS 4.3  --   --   HGB 12.0* 12.7* 12.5*  HCT 34.3* 37.1* 37.1*  MCV 93.0 93.0 94.4  PLT 118* 130* 116*   Cardiac Enzymes: No results found for this basename: CKTOTAL, CKMB, CKMBINDEX, TROPONINI,  in the last 168 hours BNP: BNP (last 3 results) No results found for this basename: PROBNP,  in the last 8760 hours CBG:  Recent Labs Lab 12/09/13 2105 12/10/13 0714 12/10/13 1138 12/10/13 2156 12/11/13 0713  GLUCAP 98 97 91 134* 111*       Signed:  Domenic Polite  [facility] 12/11/2013, 9:58 AM

## 2013-12-14 ENCOUNTER — Other Ambulatory Visit: Payer: Self-pay | Admitting: Physician Assistant

## 2013-12-14 DIAGNOSIS — I429 Cardiomyopathy, unspecified: Secondary | ICD-10-CM

## 2013-12-16 ENCOUNTER — Ambulatory Visit (HOSPITAL_COMMUNITY)
Admission: RE | Admit: 2013-12-16 | Discharge: 2013-12-16 | Disposition: A | Discharge status: Home / Self Care | Payer: Medicare Other | Source: Ambulatory Visit | Attending: Cardiology | Admitting: Cardiology

## 2013-12-16 DIAGNOSIS — I429 Cardiomyopathy, unspecified: Secondary | ICD-10-CM

## 2013-12-16 DIAGNOSIS — R42 Dizziness and giddiness: Secondary | ICD-10-CM | POA: Insufficient documentation

## 2013-12-16 DIAGNOSIS — Z8249 Family history of ischemic heart disease and other diseases of the circulatory system: Secondary | ICD-10-CM | POA: Insufficient documentation

## 2013-12-16 DIAGNOSIS — I428 Other cardiomyopathies: Secondary | ICD-10-CM

## 2013-12-16 DIAGNOSIS — Z87891 Personal history of nicotine dependence: Secondary | ICD-10-CM | POA: Insufficient documentation

## 2013-12-16 DIAGNOSIS — I2510 Atherosclerotic heart disease of native coronary artery without angina pectoris: Secondary | ICD-10-CM | POA: Insufficient documentation

## 2013-12-16 HISTORY — PX: NM MYOVIEW LTD: HXRAD82

## 2013-12-16 MED ORDER — REGADENOSON 0.4 MG/5ML IV SOLN
0.4000 mg | Freq: Once | INTRAVENOUS | Status: AC
  Administered 2013-12-16: 0.4 mg via INTRAVENOUS

## 2013-12-16 MED ORDER — TECHNETIUM TC 99M SESTAMIBI GENERIC - CARDIOLITE
30.0000 | Freq: Once | INTRAVENOUS | Status: AC | PRN
  Administered 2013-12-16: 30 via INTRAVENOUS

## 2013-12-16 MED ORDER — TECHNETIUM TC 99M SESTAMIBI GENERIC - CARDIOLITE
10.0000 | Freq: Once | INTRAVENOUS | Status: AC | PRN
  Administered 2013-12-16: 10 via INTRAVENOUS

## 2013-12-16 NOTE — Procedures (Addendum)
Tanner [name] [address] [place] [address] [phone]  Cardiology Nuclear Med Study  Tanner [name] is a 80 y.o. male     MRN : [id number]     DOB: 12-Jul-1924  Procedure Date: 12/16/2013  Nuclear Med Background Indication for Stress Test:  Graft [place], [name] and Abnormal EKG History:  CAD;CABG X2-2012;STENT/PTCA-1991;AFIB;cardiomyopathy;Last NUC on 07/24/1998-nonischemic;EF=71%;ECHO on 03/2013-EF=55-60% Cardiac Risk Factors: Family History - CAD, History of Smoking, Lipids, RBBB, TIA and Dialated aorta by ECHO  Symptoms:  Light-Headedness   Nuclear Pre-Procedure Caffeine/Decaff Intake:  7:00pm NPO After: 5:00am   IV Site: R Forearm  IV 0.9% NS with Angio Cath:  22g  Chest Size (in):  36"  IV Started by: Rolene Course, RN  Height: 5\' 10"  (1.778 m)  Cup Size: n/a  BMI:  Body mass index is 22.24 kg/(m^2). Weight:  155 lb (70.308 kg)   Tech Comments:  n/a    Nuclear Med Study 1 or 2 day study: 1 day  Stress Test Type:  [place] Provider:  Kirk Ruths, MD   Resting Radionuclide: Technetium 41m Sestamibi  Resting Radionuclide Dose: 10.5 mCi   Stress Radionuclide:  Technetium 75m Sestamibi  Stress Radionuclide Dose: 29.2 mCi           Stress Protocol Rest HR: 54 Stress HR: 61  Rest BP: 143/106 Stress BP: 149/88  Exercise Time (min): n/a METS: n/a   Predicted Max HR: 133 bpm % Max HR: 57.14 bpm Rate Pressure Product: 11324  Dose of Adenosine (mg):  n/a Dose of Lexiscan: 0.4 mg  Dose of Atropine (mg): n/a Dose of Dobutamine: n/a mcg/kg/min (at max HR)  Stress Test Technologist: Leane Para, CCT Nuclear Technologist: Otho Perl, CNMT   Rest Procedure:  Myocardial perfusion imaging was performed at rest 45 minutes following the intravenous administration of Technetium 29m Sestamibi. Stress Procedure:  The patient received IV Lexiscan 0.4 mg over 15-seconds.  Technetium 46m  Sestamibi injected IV at 30-seconds.  There were no significant changes with Lexiscan.  Quantitative spect images were obtained after a 45 minute delay.  Transient Ischemic Dilatation (Normal <1.22):  0.94 Lung/Heart Ratio (Normal <0.45):  0.24 QGS EDV:  103 ml QGS ESV:  43 ml LV Ejection Fraction: 58%  Robin Moffitt, CNMT  PHYSICIAN INTERPRETATION  Rest ECG: NSR with non-specific ST-T wave changes and NSR-LVH  Stress ECG: Inferior T Wave inversion with mild ST Depression post infusion.  QPS Raw Data Images:  Acquisition technically good; normal left ventricular size. Stress Images:  There is decreased uptake in the apex. There is a small in size, mild intensity, reversible perfusion defect in the apical anterolateral wall.   Rest Images:  There is decreased uptake in the apex.  The apical anterolateral defect is minimally visibile. Subtraction (SDS):  These findings are consistent with ischemia.  - in the apical anterolateral wall.  Impression Exercise Capacity:  Lexiscan with no exercise. BP Response:  Hypertensive blood pressure response. Clinical Symptoms:  There is dyspnea., dizziness, weakness ECG Impression:  Mild ST depression wth T wave inversion during Lexiscan, cannot exclude ischmic changes LV Wall Motion:  NL LV Function; NL Wall Motion  Comparison with Prior Nuclear Study: No images to compare  Overall Impression:  Intermediate risk stress nuclear study Small sized reversible perfusion defect in the apical anterolateral wall.  The patient has known occlusion of D1 with collaterals & is s/p CABG with LIMA-LAD & SVG-rPDAl.  Clinical  Correlation is warranted.  [name], MD  12/16/2013 7:16 PM

## 2013-12-22 NOTE — Progress Notes (Signed)
My impression is that this is a small area of ischemia, unless his chest pain is significantly concerning and ongoing, he began to have him followup in June. It was an abnormal stress test. If he is having active angina symptoms, he should be seen sooner and we can discuss whether or not we need to invasive evaluation.  It would appear that the area in question is related to the occluded diagonal branch vein graft with inadequate collaterals.  [name], MD

## 2013-12-23 ENCOUNTER — Ambulatory Visit: Payer: Medicare Other | Admitting: Neurology

## 2013-12-27 ENCOUNTER — Telehealth: Payer: Self-pay | Admitting: Neurology

## 2013-12-27 ENCOUNTER — Telehealth: Payer: Self-pay | Admitting: Cardiovascular Disease

## 2013-12-27 NOTE — Telephone Encounter (Signed)
Called and scheduled appointment w/ Ms Lam, ok'd per Dr Rexene Alberts, confirmed with wife but patient may call back to change date

## 2013-12-27 NOTE — Telephone Encounter (Signed)
Message forwarded to Sharon, RN.  

## 2013-12-27 NOTE — Telephone Encounter (Signed)
Would like stress test results from 12-15-13 please.

## 2013-12-27 NOTE — Telephone Encounter (Signed)
         Patient Information    Patient Name Sex DOB SSN   Tanner [name], Tanner [name] Male 1923-10-26 [id number]            Progress Notes by [name], MD at 12/22/2013 1:56 PM    Author: [name], MD Service: Cardiology Author Type: Physician   Filed: 12/22/2013 1:57 PM Note Time: 12/22/2013 1:56 PM Status: Signed   Editor: [name], MD (Physician)      My impression is that this is a small area of ischemia, unless his chest pain is significantly concerning and ongoing, he began to have him followup in June. It was an abnormal stress test. If he is having active angina symptoms, he should be seen sooner and we can discuss whether or not we need to invasive evaluation.  It would appear that the area in question is related to the occluded diagonal branch vein graft with inadequate collaterals.  [name], MD       Spoke to wife. Result given. She verbalized understanding. She states she will give the message to her husband ([name]),if he has any question she states he will call back.RN asked wife, if patient has had  any chest discomfort /pain. She states she is not aware  .          Patient Information    Patient Name Sex DOB SSN   Tanner [name], Tanner [name] Male Feb 26, 1924 [id number]            Progress Notes by [name], MD at 12/22/2013 1:56 PM    Author: [name], MD Service: Cardiology Author Type: Physician   Filed: 12/22/2013 1:57 PM Note Time: 12/22/2013 1:56 PM Status: Signed   Editor: [name], MD (Physician)      My impression is that this is a small area of ischemia, unless his chest pain is significantly concerning and ongoing, he began to have him followup in June. It was an abnormal stress test. If he is having active angina symptoms, he should be seen sooner and we can discuss whether or not we need to invasive evaluation.  It would appear that the area in question is related to the occluded diagonal branch vein graft with  inadequate collaterals.  [name], MD

## 2013-12-27 NOTE — Telephone Encounter (Signed)
Pt called to set up an apt for TIA and advised he has been in the hospital and forgot about his apt with Dr. Rexene Alberts, wants someone to call him to see if they can get him in sooner for his Neuropathy to f/u with Dr. Rexene Alberts. Thanks

## 2013-12-28 NOTE — Telephone Encounter (Signed)
Patient call back. RN informed patient of his results.  Tanner [name] states he is not having any difficult at present time,if he has any chest pain/discomfort problems he is aware to contact office RN confirmed with Tanner Postle about upcoming appointment 02/02/14 with Dr Ellyn Hack.

## 2014-01-12 ENCOUNTER — Telehealth: Payer: Self-pay | Admitting: Cardiology

## 2014-01-12 MED ORDER — CLOPIDOGREL BISULFATE 75 MG PO TABS: 75.0000 mg | ORAL_TABLET | Freq: Every day | ORAL | Status: DC

## 2014-01-12 NOTE — Telephone Encounter (Signed)
Plavix sent into the pharmacy electronically. Tried to return call to Fredirick Lathe. He is not listed on any designated party release for this patient. No answer/generic voicemail. Did not leave any message due to caller not being on patient's DPR.

## 2014-01-12 NOTE — Telephone Encounter (Signed)
Pt have been waiting on his medicine for 3 days. Just recently had a TIA.Please call his Pl[facility] in today [facility] [address]. Also wants test results from his Stress Test.

## 2014-01-13 ENCOUNTER — Telehealth: Payer: Self-pay | Admitting: Cardiology

## 2014-01-14 NOTE — Telephone Encounter (Signed)
Closed encounter °

## 2014-01-20 ENCOUNTER — Encounter (INDEPENDENT_AMBULATORY_CARE_PROVIDER_SITE_OTHER): Payer: Self-pay

## 2014-01-20 ENCOUNTER — Ambulatory Visit (INDEPENDENT_AMBULATORY_CARE_PROVIDER_SITE_OTHER): Payer: Medicare Other | Admitting: Neurology

## 2014-01-20 ENCOUNTER — Encounter: Payer: Self-pay | Admitting: Neurology

## 2014-01-20 ENCOUNTER — Ambulatory Visit: Payer: Medicare Other | Admitting: Cardiology

## 2014-01-20 VITALS — BP 137/71 | HR 57 | Ht 71.0 in | Wt 157.3 lb

## 2014-01-20 DIAGNOSIS — G609 Hereditary and idiopathic neuropathy, unspecified: Secondary | ICD-10-CM | POA: Insufficient documentation

## 2014-01-20 NOTE — Progress Notes (Signed)
[facility] [address] [place]. [place] [address] [phone]       OFFICE FOLLOW-UP NOTE  Mr. Tanner [name] Date of Birth:  March 13, 1924 Medical Record Number:  [id number]   HPI: 23 year Caucasian male seen for first office followup visit from hospital admission for TIA on 12/10/13. He presented with 6 radar episode of dizziness, imbalance and word finding difficulties as well as confusion. He was working on a rental house fixing a light fixture when he began to feel dizzy and off balance. His symptoms resolved by the time he reached the emergency room. He had had 2 similar episodes in the past several years ago but at that time he felt the episodes were different and was diagnosed with transient global amnesia. Patient did not have any memory problems during the present episode. He was found to be slightly bradycardic with a heart rate in 50s with the patient feels that this is long-standing. MRI scan of the brain showed no acute infarct. MRA of the brain showed no largest stenosis. Carotid ultrasound showed no and stenosis. Transthoracic echo showed normal ejection fraction. LDL cholesterol was fine hemoglobin A1c was 6.1. Patient did have a remote history of transient atrial fibrillation postop following heart surgery but had remained in sinus rhythm  Since then   He has mild chronic thrombocytopenia which is stable. He was started on plavix and states he is tolerating it well without any significant bleeding, bruising or other side effects. He states his lipid profile is under good control. He has a history of peripheral sensory neuropathy and numbness in his feet and sees Dr. Rexene Alberts in our office.r. He feels that his neuropathy symptoms are unchanged.  ROS:   14 system review of systems is positive for dizziness, numbness, gait and balance difficulties only and all other systems negative  PMH:  Past Medical History  Diagnosis Date  . Osteoporosis   . Hypercholesteremia     . Tremors of nervous system     hands  . CAD (coronary artery disease), native coronary artery 1991; 2012    PCI - LAD; Cath in 2012 revealed severe RCA & LAD Diseaes -- CABG x 2  . AF (atrial fibrillation) -- Post Operative   . Neuropathy of lower extremity   . Nephrolithiasis     Social History:  History   Social History  . Marital Status: Married    Spouse Name: Stanton Kidney    Number of Children: 0  . Years of Education: BS, MS   Occupational History  . Retired    Social History Main Topics  . Smoking status: Former Smoker -- 1.00 packs/day for 5 years    Types: Cigarettes    Quit date: 02/08/1938  . Smokeless tobacco: Former Systems developer  . Alcohol Use: No  . Drug Use: No  . Sexual Activity: Not on file   Other Topics Concern  . Not on file   Social History Narrative   Pt lives at home with spouse.   Caffeine Use: very little    Medications:   Current Outpatient Prescriptions on File Prior to Visit  Medication Sig Dispense Refill  . acetaminophen (TYLENOL) 325 MG tablet Take 2 tablets (650 mg total) by mouth every 6 (six) hours as needed for mild pain or headache.      [name] Kitchen atorvastatin (LIPITOR) 80 MG tablet Take 80 mg by mouth daily.      . calcium-vitamin D (OSCAL WITH D) 500-200 MG-UNIT per tablet Take 1  tablet by mouth 2 (two) times daily.      . clopidogrel (PLAVIX) 75 MG tablet Take 1 tablet (75 mg total) by mouth daily with breakfast.  30 tablet  1  . fish oil-omega-3 fatty acids 1000 MG capsule Take 2 g by mouth daily.      . metoprolol tartrate (LOPRESSOR) 25 MG tablet Take 12.5 mg by mouth 2 (two) times daily.      . Multiple Vitamin (MULTIVITAMIN) capsule Take 1 capsule by mouth daily.       No current facility-administered medications on file prior to visit.    Allergies:  No Known Allergies  Physical Exam General: well developed, well nourished, seated, in no evident distress Head: head normocephalic and atraumatic. Orohparynx benign Neck: supple with no  carotid or supraclavicular bruits Cardiovascular: regular rate and rhythm, no murmurs Musculoskeletal: no deformity Skin:  no rash/petichiae Vascular:  Normal pulses all extremities Filed Vitals:   01/20/14 1316  BP: 137/71  Pulse: 57   Neurologic Exam Mental Status: Awake and fully alert. Oriented to place and time. Recent and remote memory intact. Attention span, concentration and fund of knowledge appropriate. Mood and affect appropriate.  Cranial Nerves: Fundoscopic exam reveals sharp disc margins. Pupils equal, briskly reactive to light. Extraocular movements full without nystagmus. Visual fields full to confrontation. Hearing intact. Facial sensation intact. Face, tongue, palate moves normally and symmetrically.  Motor: Normal bulk and tone. Normal strength in all tested extremity muscles. Sensory.: intact to touch and pinprick and vibratory sensation in upper extremities but diminished in a stocking distribution in lower extremity. Positive Romberg sign. Diminished vibration from ankles down bilaterally..  Coordination: Rapid alternating movements normal in all extremities. Finger-to-nose and heel-to-shin performed accurately bilaterally. Gait and Station: Arises from chair without difficulty. Stance is slightly broad-based l. Gait demonstrates normal stride length and balance . Unable to heel, toe and tandem walk without difficulty.  Reflexes: 1+ and symmetric in upper extremities and knees but ankle jerks are depressed bilaterally.. Toes downgoing.   NIHSS 0 Modified Rankin 1  ASSESSMENT: 82 year Caucasian male with posterior circulation TIA in April 2015. Vascular risk factors of hyperlipidemia and coronary artery disease. Long-standing history of peripheral sensory neuropathy   PLAN: I had a long discussion with the patient regards his recent TIA, and discuss risk factors and secondary prevention strategies and answered questions. Continue Plavix for stroke prevention and strict  control of lipids with LDL cholesterol goal below 100 mg percent. Increase his activities slowly as tolerated. Continue followup with Dr. Rexene Alberts for peripheral neuropathy and TIA. No followup is necessary with me.  S  Note: This document was prepared with digital dictation and possible smart phrase technology. Any transcriptional errors that result from this process are unintentional

## 2014-01-20 NOTE — Patient Instructions (Signed)
I had a long discussion with the patient regards his recent TIA, and discuss risk factors and secondary prevention strategies and answered questions. Continue Plavix for stroke prevention and strict control of lipids with LDL cholesterol goal below 100 mg percent. Increase his activities slowly as tolerated. Continue followup with Dr. Rexene Alberts for peripheral neuropathy and TIA. No followup is necessary with me.  Stroke Prevention Some medical conditions and behaviors are associated with an increased chance of having a stroke. You may prevent a stroke by making healthy choices and managing medical conditions. HOW CAN I REDUCE MY RISK OF HAVING A STROKE?   Stay physically active. Get at least 30 minutes of activity on most or all days.  Do not smoke. It may also be helpful to avoid exposure to secondhand smoke.  Limit alcohol use. Moderate alcohol use is considered to be:  No more than 2 drinks per day for men.  No more than 1 drink per day for nonpregnant women.  Eat healthy foods. This involves  Eating 5 or more servings of fruits and vegetables a day.  Following a diet that addresses high blood pressure (hypertension), high cholesterol, diabetes, or obesity.  Manage your cholesterol levels.  A diet low in saturated fat, trans fat, and cholesterol and high in fiber may control cholesterol levels.  Take any prescribed medicines to control cholesterol as directed by your health care provider.  Manage your diabetes.  A controlled-carbohydrate, controlled-sugar diet is recommended to manage diabetes.  Take any prescribed medicines to control diabetes as directed by your health care provider.  Control your hypertension.  A low-salt (sodium), low-saturated fat, low-trans fat, and low-cholesterol diet is recommended to manage hypertension.  Take any prescribed medicines to control hypertension as directed by your health care provider.  Maintain a healthy weight.  A reduced-calorie,  low-sodium, low-saturated fat, low-trans fat, low-cholesterol diet is recommended to manage weight.  Stop drug abuse.  Avoid taking birth control pills.  Talk to your health care provider about the risks of taking birth control pills if you are over 69 years old, smoke, get migraines, or have ever had a blood clot.  Get evaluated for sleep disorders (sleep apnea).  Talk to your health care provider about getting a sleep evaluation if you snore a lot or have excessive sleepiness.  Take medicines as directed by your health care provider.  For some people, aspirin or blood thinners (anticoagulants) are helpful in reducing the risk of forming abnormal blood clots that can lead to stroke. If you have the irregular heart rhythm of atrial fibrillation, you should be on a blood thinner unless there is a good reason you cannot take them.  Understand all your medicine instructions.  Make sure that other other conditions (such as anemia or atherosclerosis) are addressed. SEEK IMMEDIATE MEDICAL CARE IF:   You have sudden weakness or numbness of the face, arm, or leg, especially on one side of the body.  Your face or eyelid droops to one side.  You have sudden confusion.  You have trouble speaking (aphasia) or understanding.  You have sudden trouble seeing in one or both eyes.  You have sudden trouble walking.  You have dizziness.  You have a loss of balance or coordination.  You have a sudden, severe headache with no known cause.  You have new chest pain or an irregular heartbeat. Any of these symptoms may represent a serious problem that is an emergency. Do not wait to see if the symptoms will go  away. Get medical help at once. Call your local emergency services  (911 in [place] Do not drive yourself to the hospital. Document Released: 09/26/2004 Document Revised: 06/09/2013 Document Reviewed: 02/19/2013 [facility] Patient Information 2014 [place].

## 2014-01-21 ENCOUNTER — Ambulatory Visit: Payer: Self-pay | Admitting: Nurse Practitioner

## 2014-01-21 ENCOUNTER — Ambulatory Visit (INDEPENDENT_AMBULATORY_CARE_PROVIDER_SITE_OTHER): Payer: Medicare Other | Admitting: Cardiology

## 2014-01-21 ENCOUNTER — Encounter: Payer: Self-pay | Admitting: Cardiology

## 2014-01-21 VITALS — BP 155/79 | HR 52 | Ht 71.0 in | Wt 157.6 lb

## 2014-01-21 DIAGNOSIS — I4891 Unspecified atrial fibrillation: Secondary | ICD-10-CM

## 2014-01-21 DIAGNOSIS — I2510 Atherosclerotic heart disease of native coronary artery without angina pectoris: Secondary | ICD-10-CM

## 2014-01-21 DIAGNOSIS — I420 Dilated cardiomyopathy: Secondary | ICD-10-CM

## 2014-01-21 DIAGNOSIS — I428 Other cardiomyopathies: Secondary | ICD-10-CM

## 2014-01-21 DIAGNOSIS — G459 Transient cerebral ischemic attack, unspecified: Secondary | ICD-10-CM

## 2014-01-21 MED ORDER — CLOPIDOGREL BISULFATE 75 MG PO TABS: 75.0000 mg | ORAL_TABLET | Freq: Every day | ORAL | Status: DC

## 2014-01-21 NOTE — Patient Instructions (Signed)
Your stress test showed improvement in your pump action of the heart.  If you have any chest pain or shortness of breath call us or go to the ER.    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  Drink fluids.  Follow up with Dr. Ellyn Hack in 6 weeks.

## 2014-01-24 NOTE — Assessment & Plan Note (Signed)
Hospitalized recently for TIA is to followup with neurology

## 2014-01-24 NOTE — Assessment & Plan Note (Signed)
Resolved,  EF during hospitalization 35% on echo,  with stress test normal LV function and no wall motion abnormalities.  Goal not add ACE at this time due to patient's activity level blood pressure remains high would then consider.

## 2014-01-24 NOTE — Assessment & Plan Note (Signed)
No A. fib noted recently. May possibly need of that monitor with recent episode of TIA. Patient will followup with Dr. Ellyn Hack in 6 weeks unless he has problems prior that time we will discuss with Dr. Ellyn Hack

## 2014-01-24 NOTE — Progress Notes (Signed)
01/24/2014   PCP: Thressa Sheller, MD   Chief Complaint  Patient presents with  . Follow-up    post hospital, follow-up test    Primary Cardiologist:Dr. Roni Bread   HPI:  80 year old married male with history of 2 vessel CABG in November 2012 with a LIMA-LAD history of the PDA.  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 He does help out with building houses he is actually to start a project [place] and is commuting on Sunday for another disaster relief project. 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 He also is very active with exercising relatively routinely.   Recently hospitalized April 92 April 11 this year secondary to TIA. During that timeframe he underwent 2-D echo: Left ventricle: There was focal basal hypertrophy. Systolic function was moderately reduced. The estimated ejection fraction was in the range of 35% to 40%. - Aortic valve: Mild regurgitation. - Aorta: The aorta was moderately dilated. - Mitral valve: Mild regurgitation. - Right atrium: The atrium was mildly to moderately dilated.  This was a new drop in EF previously it had been 55-60%.  Patient was seen by cardiology in the hospital and scheduled for outpatient Nuc. Study.  The next day revealed normal LV function normal wall motion he did have intermediate risk stress but study for a small size reversible perfusion defect in the apical anterolateral wall. The patient has known occlusion of diagonal 1 with collaterals and is status post bypass grafting with LIMA to the LAD vein graft to the right PDA 1.  It is now EF has improved and patient denies any chest pain and is able to go about his routine activities which are quite  active no further changes in medication will be done.  Plan had been to at ACE inhibitor if his EF continued to be low.  As stated previously he works outside Primary school teacher for [employer] he was reminded not to overheating and to drink plenty of fluids.  No Known Allergies  Current Outpatient Prescriptions  Medication Sig Dispense Refill  . atorvastatin (LIPITOR) 80 MG tablet Take 80 mg by mouth daily.      . calcium-vitamin D (OSCAL WITH D) 500-200 MG-UNIT per tablet Take 1 tablet by mouth 2 (two) times daily.      . clopidogrel (PLAVIX) 75 MG tablet Take 1 tablet (75 mg total) by mouth daily with breakfast.  90 tablet  3  . fish oil-omega-3 fatty acids 1000 MG capsule Take 2 g by mouth daily.      . metoprolol tartrate (LOPRESSOR) 25 MG tablet Take 12.5 mg by mouth 2 (two) times daily.      . Multiple Vitamin (MULTIVITAMIN) capsule Take 1 capsule by mouth daily.      . Naproxen Sodium (ALEVE PO) Take 1 tablet by mouth daily.       No current facility-administered medications for this visit.    Past Medical History  Diagnosis Date  . Osteoporosis   . Hypercholesteremia   . Tremors of nervous system     hands  . CAD (coronary artery disease), native coronary artery 1991; 2012    PCI - LAD; Cath in 2012 revealed severe RCA & LAD Diseaes -- CABG x  2  . AF (atrial fibrillation) -- Post Operative   . Neuropathy of lower extremity   . Nephrolithiasis     Past Surgical History  Procedure Laterality Date  . Coronary artery bypass graft  10/2010    LIMA-LAD, SVG-rPDA  . Doppler echocardiography  10/18/2004    EF 55-65%,BORDERLINE -enlarged aortic root,mild aortic insuff.,trace tricus.insuff.  . Nm myocar perf wall motion  07/24/1998    EF 71%  . Cardiac catheterization  10/17/2010    80%prox w/mild in-stent restenosis w/75% mid-LADstenosis aft the stent,multi severe stenoses RCAw/heavily calcified vessel,norm LV  . Tonsillectomy    . Appendectomy      EZM:OQHUTML:YY colds or  fevers, no weight changes Skin:no rashes or ulcers HEENT:no blurred vision, no congestion CV:see HPI PUL:see HPI GI:no diarrhea constipation or melena, no indigestion GU:no hematuria, no dysuria MS:no joint pain, no claudication Neuro:no syncope, no lightheadedness Endo:no diabetes, no thyroid disease  PHYSICAL EXAM BP 155/79  Pulse 52  Ht 5\' 11"  (1.803 m)  Wt 157 lb 9.6 oz (71.487 kg)  BMI 21.99 kg/m2 General:Pleasant affect, NAD Skin:Warm and dry, brisk capillary refill HEENT:normocephalic, sclera clear, mucus membranes moist Neck:supple, no JVD, no bruits  Heart:S1S2 RRR without murmur, gallup, rub or click Lungs:clear without rales, rhonchi, or wheezes TKP:TWSF, non tender, + BS, do not palpate liver spleen or masses Ext:no lower ext edema, 2+ pedal pulses, 2+ radial pulses Neuro:alert and oriented, MAE, follows commands, + facial symmetry   ASSESSMENT AND PLAN Congestive dilated cardiomyopathy Resolved,  EF during hospitalization 35% on echo,  with stress test normal LV function and no wall motion abnormalities.  Goal not add ACE at this time due to patient's activity level blood pressure remains high would then consider.   CAD (coronary artery disease), native coronary artery Mildly abnormal stress test the patient has no chest pain at all no shortness of breath with exertion. In his EF is now normal. No change in medications  TIA (transient ischemic attack) Hospitalized recently for TIA is to followup with neurology  AF (atrial fibrillation) -- Post Operative No A. fib noted recently. May possibly need of that monitor with recent episode of TIA. Patient will followup with Dr. Ellyn Hack in 6 weeks unless he has problems prior that time we will discuss with Dr. Ellyn Hack

## 2014-01-24 NOTE — Assessment & Plan Note (Signed)
Mildly abnormal stress test the patient has no chest pain at all no shortness of breath with exertion. In his EF is now normal. No change in medications

## 2014-02-02 ENCOUNTER — Ambulatory Visit: Payer: Medicare Other | Admitting: Cardiology

## 2014-03-08 ENCOUNTER — Other Ambulatory Visit: Payer: Self-pay

## 2014-03-08 MED ORDER — CLOPIDOGREL BISULFATE 75 MG PO TABS: 75.0000 mg | ORAL_TABLET | Freq: Every day | ORAL | Status: DC

## 2014-03-08 NOTE — Telephone Encounter (Signed)
Rx was sent to pharmacy electronically. 

## 2014-03-14 ENCOUNTER — Ambulatory Visit (INDEPENDENT_AMBULATORY_CARE_PROVIDER_SITE_OTHER): Payer: Medicare Other | Admitting: Cardiology

## 2014-03-14 ENCOUNTER — Ambulatory Visit: Payer: Medicare Other | Admitting: Neurology

## 2014-03-14 ENCOUNTER — Encounter: Payer: Self-pay | Admitting: Cardiology

## 2014-03-14 VITALS — BP 154/76 | HR 55 | Ht 70.75 in | Wt 155.3 lb

## 2014-03-14 DIAGNOSIS — I351 Nonrheumatic aortic (valve) insufficiency: Secondary | ICD-10-CM

## 2014-03-14 DIAGNOSIS — Z9861 Coronary angioplasty status: Secondary | ICD-10-CM

## 2014-03-14 DIAGNOSIS — I428 Other cardiomyopathies: Secondary | ICD-10-CM

## 2014-03-14 DIAGNOSIS — I4891 Unspecified atrial fibrillation: Secondary | ICD-10-CM

## 2014-03-14 DIAGNOSIS — I359 Nonrheumatic aortic valve disorder, unspecified: Secondary | ICD-10-CM

## 2014-03-14 DIAGNOSIS — I2510 Atherosclerotic heart disease of native coronary artery without angina pectoris: Secondary | ICD-10-CM

## 2014-03-14 DIAGNOSIS — I480 Paroxysmal atrial fibrillation: Secondary | ICD-10-CM

## 2014-03-14 DIAGNOSIS — I420 Dilated cardiomyopathy: Secondary | ICD-10-CM

## 2014-03-14 DIAGNOSIS — G459 Transient cerebral ischemic attack, unspecified: Secondary | ICD-10-CM

## 2014-03-14 NOTE — Patient Instructions (Addendum)
Your physician has requested that you have an echocardiogram. Echocardiography is a painless test that uses sound waves to create images of your heart. It provides your doctor with information about the size and shape of your heart and how well your heart's chambers and valves are working. This procedure takes approximately one hour. There are no restrictions for this procedure.  Your physician wants you to follow-up in [place].  You will receive a reminder letter in the mail two months in advance. If you don't receive a letter, please call our office to schedule the follow-up appointment.

## 2014-03-16 ENCOUNTER — Encounter: Payer: Self-pay | Admitting: Cardiology

## 2014-03-16 NOTE — Assessment & Plan Note (Signed)
No real active anginal symptoms and a very active gentleman. He does have an abnormal stress test, however he can correlate well with his known anatomy. In the absence of ongoing symptoms, mild recommendation would be to simply continue medical therapy. He is not overly excited about the prospect of evaluating with an invasive study such as cardiac catheterization.

## 2014-03-16 NOTE — Assessment & Plan Note (Signed)
I don't have the film the true etiology for his TIA symptoms. There is always a possibility that he does intermittently go into A. fib which could set him up for stroke. For now we will not anticoagulate until there is a proven recurrence.

## 2014-03-16 NOTE — Assessment & Plan Note (Signed)
No restricted noted. This could however be the culprit for his TIA. A marker was not ordered as part of his post- hospital course. I do agree that he has any recurrent symptoms, we will have to rediscuss the concept of full anticoagulation. At this point, he is not excited about discussing it.

## 2014-03-16 NOTE — Assessment & Plan Note (Signed)
I suspect some of it is reduced ejection fraction was stress related in the setting of a possible TIA. His nuclear stress test did suggest an improved ejection fraction which tends not to be the case unless true. He is on metoprolol tartrate not succinate and no other medications. Plan was to consider adding an ACE inhibitor which we could bring what to do with his blood pressure being high. He just is reluctant to take medications.  My suspicion is that his EF has improved back to baseline, however I would like to give him some time to completion of recovery and recheck an echocardiogram just to confirm what the Myoview shows.  Plan: Recheck 2-D echocardiogram to reassess EF after recent exacerbation.

## 2014-03-16 NOTE — Assessment & Plan Note (Signed)
We rechecked an echocardiogram last year that showed mild AI and mild aortic . dilation. Is not unreasonable to recheck an echocardiogram at this point one year out. Hopefully this will also show improved cardiac function.

## 2014-03-16 NOTE — Progress Notes (Signed)
PCP: Thressa Sheller, MD  Clinic Note: Chief Complaint  Patient presents with  . Follow-up    last OV 5/22 with Mickel Baas - no complaints    HPI: Tanner [name] is a 80 y.o. male with a Cardiovascular Problem List below who presents today for followup oh abnormal stress test and recent episode of reduced ejection fraction. He is a very pleasant gentleman with a history of mild aortic deficiency, TIA and PAF with CAD status post PCI than CABG. At baseline he is extremely active, doing various volunteer work for [employer] for [employer]. 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 He was recently hospitalized in April of 2015 for her symptoms that were concerning for possible TIA. He had the obligatory 2-D echocardiogram performed the to evaluate TIA. Unfortunately, this revealed a notable drop in EF to 35% from 55-60%. He was seen by Dr. Stanford Breed while in the hospital who recommended an outpatient stress test. He was also started on low-dose ACE inhibitor. He now presents to discuss the results of his stress test. He actually saw Arcelia Jew in May of 22nd, and was told of the results suggesting "intermediate risk "study for a small sized reversible perfusion defect in the apical anterolateral wall. Review of previous Reports suggest that this could very well be related to the occlusion of a diagonal branch with collaterals as well as possible residual from his LAD disease acquiring LIMA to LAD grafting. The stress test did however show his via being back to relatively normal (58%). Since he was not having any significant anginal symptoms, the plan was for him to do followup today.  Interval History: I don't believe really remembers much about that visit. He is here now to discuss the results. Prior to his visit with Mickel Baas on May 22, he was contacted by my nurse Trixie Dredge, RN) inform you of the results of the stress test. The recommendation was to see if he was having any anginal type  symptoms. If so he was told to come back sooner rather than later this may suggest possible graft occlusion, however with the absence of anginal chest pain we would simply see him in as scheduled in June. However this appointment was postponed until now. He presents today feeling quite well. 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 He is not having any signs or symptoms of chest tightness or pressure with rest or exertion. He gets a little short of breath when overexerting himself otherwise is normal with routine activities. No PND, orthopnea or edema suggest heart failure symptoms. No palpitations/rapid or irregular heartbeats. No syncope/near syncope or TIA/amaurosis fugax symptoms. No melena, hematochezia, hematuria, epistaxis, no claudication.   importantly, he has not had any recurrence of this hospitalization his symptoms that were concerning for TIA.  Past Medical History  Diagnosis Date  . Osteoporosis   . Hypercholesteremia   . Tremors of nervous system     hands  . CAD (coronary artery disease), native coronary artery 1991; 2012    PCI - LAD; Cath in 2012 revealed severe RCA & LAD Diseaes -- CABG x 2  . AF (atrial fibrillation) -- Post Operative   . Neuropathy of lower extremity   . Nephrolithiasis   . Dilated idiopathic cardiomyopathy April 2015     in setting of TIA: EF 35-40% (reduced from 55-65 percent) mild AI, moderate and dilated aorta.    Prior Cardiac Evaluation and Past Surgical History: Past Surgical  History  Procedure Laterality Date  . Coronary artery bypass graft  10/2010    LIMA-LAD, SVG-rPDA  . Doppler echocardiography  10/18/2004    EF 55-65%,BORDERLINE -enlarged aortic root,mild aortic insuff.,trace tricus.insuff.  . Nm myocar perf wall motion  07/24/1998    EF 71%  . Cardiac catheterization  10/17/2010    80%prox w/mild in-stent restenosis w/75% mid-LADstenosis aft the stent,multi severe stenoses RCAw/heavily  calcified vessel,norm LV  . Tonsillectomy    . Appendectomy    . Transthoracic echocardiogram  April 2015    EF 35-40%, mild AI, moderately dilated ascending aorta  . Nm myoview ltd  12/16/2013    INTERMEDIATE RISK test with small sized reversible perfusion defect in the apical anterolateral wall -- most likely consistent with known occlusion of D1 with collaterals.   NKDA  Current Outpatient Prescriptions on File Prior to Visit  Medication Sig Dispense Refill  . atorvastatin (LIPITOR) 80 MG tablet Take 80 mg by mouth daily.      . calcium-vitamin D (OSCAL WITH D) 500-200 MG-UNIT per tablet Take 1 tablet by mouth 2 (two) times daily.      . clopidogrel (PLAVIX) 75 MG tablet Take 1 tablet (75 mg total) by mouth daily with breakfast.  30 tablet  10  . fish oil-omega-3 fatty acids 1000 MG capsule Take 2 g by mouth daily.      . metoprolol tartrate (LOPRESSOR) 25 MG tablet Take 12.5 mg by mouth 2 (two) times daily.      . Multiple Vitamin (MULTIVITAMIN) capsule Take 1 capsule by mouth daily.      . Naproxen Sodium (ALEVE PO) Take 1 tablet by mouth daily.       No current facility-administered medications on file prior to visit.   No Change in Social and Family History  ROS: A comprehensive Review of Systems - Negative except Mild aches and pains, and bruises from work he gets his fingers with a hammer. He really denies any significant symptoms whatsoever.  Wt Readings from Last 3 Encounters:  03/14/14 155 lb 4.8 oz (70.444 kg)  01/21/14 157 lb 9.6 oz (71.487 kg)  01/20/14 157 lb 4.8 oz (71.351 kg)    PHYSICAL EXAM BP 154/76  Pulse 55  Ht 5' 10.75" (1.797 m)  Wt 155 lb 4.8 oz (70.444 kg)  BMI 21.81 kg/m2 General appearance: alert, cooperative, appears stated age, no distress; pleasant mood and affect very healthy-appearing.  Neck: no adenopathy, no carotid bruit and no JVD Lungs: clear to auscultation bilaterally, normal percussion bilaterally and non-labored Heart: regular rate  and rhythm, S1, S2 normal, no murmur, click, rub or gallop; nondisplaced PMI Abdomen: soft, non-tender; bowel sounds normal; no masses,  no organomegaly;  no bruits Extremities: extremities normal, atraumatic, no cyanosis, and trace edema. Pulses: 2+ and symmetric;  Neurologic: Mental status: Alert, oriented, thought content appropriate Cranial nerves: normal (II-XII grossly intact)   Adult ECG Report  Rate:  55 ;  Rhythm: sinus bradycardia and Otherwise normal EKG Recent Labs  none since April:   ASSESSMENT / PLAN: Congestive dilated cardiomyopathy I suspect some of it is reduced ejection fraction was stress related in the setting of a possible TIA. His nuclear stress test did suggest an improved ejection fraction which tends not to be the case unless true. He is on metoprolol tartrate not succinate and no other medications. Plan was to consider adding an ACE inhibitor which we could bring what to do with his blood pressure being high. He just is  reluctant to take medications.  My suspicion is that his EF has improved back to baseline, however I would like to give him some time to completion of recovery and recheck an echocardiogram just to confirm what the Myoview shows.  Plan: Recheck 2-D echocardiogram to reassess EF after recent exacerbation.  Mild aortic insufficiency We rechecked an echocardiogram last year that showed mild AI and mild aortic . dilation. Is not unreasonable to recheck an echocardiogram at this point one year out. Hopefully this will also show improved cardiac function.   CAD S/P percutaneous coronary angioplasty No real active anginal symptoms and a very active gentleman. He does have an abnormal stress test, however he can correlate well with his known anatomy. In the absence of ongoing symptoms, mild recommendation would be to simply continue medical therapy. He is not overly excited about the prospect of evaluating with an invasive study such as cardiac  catheterization.  AF (atrial fibrillation) -- Post Operative No restricted noted. This could however be the culprit for his TIA. A marker was not ordered as part of his post- hospital course. I do agree that he has any recurrent symptoms, we will have to rediscuss the concept of full anticoagulation. At this point, he is not excited about discussing it.  TIA (transient ischemic attack) I don't have the film the true etiology for his TIA symptoms. There is always a possibility that he does intermittently go into A. fib which could set him up for stroke. For now we will not anticoagulate until there is a proven recurrence.   Orders Placed This Encounter  Procedures  . EKG 12-Lead  . 2D Echocardiogram without contrast    ABN MYOVIEW    Standing Status: Future     Number of Occurrences:      Standing Expiration Date: 03/14/2015    Order Specific Question:  Type of Echo    Answer:  Complete    Order Specific Question:  Where should this test be performed    Answer:  [facility]    Order Specific Question:  Reason for exam-Echo    Answer:  Cardiomyopathy-Unspecified  425.9    Order Specific Question:  Reason for exam-Echo    Answer:  CAD Native Vessel  414.01   No orders of the defined types were placed in this encounter.    Followup:  6 months  Mckinley Adelstein W. Ellyn Hack, M.D., M.S. Interventional Cardiologist [facility]

## 2014-03-23 ENCOUNTER — Ambulatory Visit (HOSPITAL_COMMUNITY): Payer: Medicare Other

## 2014-03-24 ENCOUNTER — Encounter: Payer: Self-pay | Admitting: Neurology

## 2014-03-24 ENCOUNTER — Ambulatory Visit (INDEPENDENT_AMBULATORY_CARE_PROVIDER_SITE_OTHER): Payer: Medicare Other | Admitting: Neurology

## 2014-03-24 VITALS — BP 123/58 | HR 60 | Temp 96.6°F | Ht 71.0 in | Wt 156.0 lb

## 2014-03-24 DIAGNOSIS — G609 Hereditary and idiopathic neuropathy, unspecified: Secondary | ICD-10-CM

## 2014-03-24 DIAGNOSIS — Z9861 Coronary angioplasty status: Secondary | ICD-10-CM

## 2014-03-24 DIAGNOSIS — Z951 Presence of aortocoronary bypass graft: Secondary | ICD-10-CM

## 2014-03-24 DIAGNOSIS — Z8673 Personal history of transient ischemic attack (TIA), and cerebral infarction without residual deficits: Secondary | ICD-10-CM

## 2014-03-24 DIAGNOSIS — R278 Other lack of coordination: Secondary | ICD-10-CM

## 2014-03-24 DIAGNOSIS — R279 Unspecified lack of coordination: Secondary | ICD-10-CM

## 2014-03-24 DIAGNOSIS — I4891 Unspecified atrial fibrillation: Secondary | ICD-10-CM

## 2014-03-24 NOTE — Progress Notes (Signed)
Subjective:    Patient ID: Tanner [name] is a 80 y.o. male.  HPI    Interim history:   Mr. Tanner [name] is a very pleasant 80 year old right-handed gentleman with an underlying medical history of heart disease, s/p angioplasty in 1991 and then 2 vessel CABG in 2012, hyperlipidemia, a fib, osteoporosis, osteoarthritis and BPH, who presents for followup consultation of his neuropathy and sensory ataxia. He is unaccompanied today. I last saw him on 06/21/2013, at which time he had physical examination findings consistent with neuropathy and mild sensory ataxia and I felt his exam was stable. We checked heavy metals at the time which were unremarkable. He was reminded not to climb ladders or be at heights and stay well-hydrated and not to get overheated. In the interim, on 12/09/2013 he presented to the emergency room with new onset poor balance, word finding difficulties, confusion and dizziness. He was working on a rental house fixing a light fixture when he began to feel this way and overall symptoms lasted about 6 hours. Previously he has a history of transient global amnesia. Neurology was consulted and felt that it was more likely a TIA and he therefore he was admitted for TIA workup. He was switched to Plavix. Carotid Doppler studies were done as well as echocardiogram and CT head an MRI and MRA. I reviewed all the hospital test results. Carotid Doppler test results from 12/10/2013 showed: Bilateral: 1-39% ICA stenosis. Echocardiogram from 12/10/2013 showed: He was 35-40, mild aortic regurgitation, moderate aortic dilatation, mild mitral valve regurgitation, right atrium was mildly to moderately dilated. Cardiology was consulted. CT head from 12/09/2013 without contrast showed: Atrophy with periventricular small vessel disease. No intracranial mass, hemorrhage, or acute appearing infarct. MRI and MRA brain from 12/10/2013 showed: No acute intracranial infarct or other abnormality identified. Small remote  right frontal lobe infarct as above. Mild atrophy with moderate chronic microvascular ischemic disease. Normal MRA of the brain without evidence of high-grade flow-limiting stenosis, proximal branch occlusion, or other acute abnormality. No intracranial aneurysm.  He saw my colleague Dr. [name] on 01/20/2014 for TIA followup from the admission. He was advised to continue with Plavix. He was advised regarding secondary stroke prevention and lipid control.  Today, he feels fine, he has not fallen, and he is without complaints. He does not use a cane. He lives with his wife of 49 years at [facility] with his wife (who lost her other triplet last year in November).   I first met him on 03/11/2013, which time I suggested further workup in the form of blood work and EMG and nerve conduction testing. He reported a one-year history of lower extremity numbness. He has no Hx of EtOH abuse. He helps build houses. He does not use a cane or walker. He denies back pain. He has had no exposures to toxins.  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 He and his wife live at [facility]; they have been married 85 years and they have no children.  He reported no tingling, no burning, no pain, no tenderness, but occasional cramping. His balance is not as good, especially with his eyes closed. He describes no claudication.  His blood work showed for the most part normal findings with the exception of a borderline elevated CK-MB. I tested ANA, ESR, B12, B1, vitamin D, RPR, SPEP, immunofluorescent electrophoresis him a  TSH, CRP, CMP, all of which were normal. 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 EMG evaluation of the right lower extremity shows distal denervation that is acute and chronic consistent with the diagnosis of peripheral neuropathy. There is mild acute denervation in the lumbosacral paraspinal muscles, and a mild overlying lumbosacral radiculopathy of indeterminate level is suspected. Clinical correlation is required.   His Past Medical History Is Significant For: Past Medical History  Diagnosis Date  . Osteoporosis   . Hypercholesteremia   . Tremors of nervous system     hands  . CAD (coronary artery disease), native coronary artery 1991; 2012    PCI - LAD; Cath in 2012 revealed severe RCA & LAD Diseaes -- CABG x 2  . AF (atrial fibrillation) -- Post Operative   . Neuropathy of lower extremity   . Nephrolithiasis   . Dilated idiopathic cardiomyopathy April 2015     in setting of TIA: EF 35-40% (reduced from 55-65 percent) mild AI, moderate and dilated aorta.    His Past Surgical History Is Significant For: Past Surgical History  Procedure Laterality Date  . Coronary artery bypass graft  10/2010    LIMA-LAD, SVG-rPDA  . Doppler echocardiography  10/18/2004    EF 55-65%,BORDERLINE -enlarged aortic root,mild aortic insuff.,trace tricus.insuff.  . Nm myocar perf wall motion  07/24/1998    EF 71%  . Cardiac catheterization  10/17/2010    80%prox w/mild in-stent restenosis w/75% mid-LADstenosis aft the stent,multi severe stenoses RCAw/heavily calcified vessel,norm LV  . Tonsillectomy    . Appendectomy    . Transthoracic echocardiogram  April 2015    EF 35-40%, mild AI, moderately dilated ascending aorta  . Nm myoview ltd  12/16/2013    INTERMEDIATE RISK test with small sized reversible perfusion defect in the apical anterolateral wall -- most likely consistent with known occlusion of D1 with collaterals.    His Family History Is Significant For: Family History  Problem Relation Age of Onset  . Heart Problems Mother   . Parkinson's disease Father   .  Heart Problems Father     His Social History Is Significant For: History   Social History  . Marital Status: Married    Spouse Name: Stanton Kidney    Number of Children: 0  . Years of Education: BS, MS   Occupational History  . Retired    Social History Main Topics  . Smoking status: Former Smoker -- 1.00 packs/day for 5 years    Types: Cigarettes    Quit date: 02/08/1938  . Smokeless tobacco: Former Systems developer  . Alcohol Use: No  . Drug Use: No  . Sexual Activity: None   Other Topics Concern  . None   Social History Narrative   Pt lives at home with spouse.   Caffeine Use: very little    His Allergies Are:  No Known Allergies:   His Current Medications Are:  Outpatient Encounter Prescriptions as of 03/24/2014  Medication Sig  . atorvastatin (LIPITOR) 80 MG tablet Take 80 mg by mouth daily.  . calcium-vitamin D (OSCAL WITH D) 500-200 MG-UNIT per tablet Take 1 tablet by mouth 2 (two) times daily.  . clopidogrel (PLAVIX) 75 MG tablet Take 1 tablet (75 mg total) by mouth daily with breakfast.  . fish oil-omega-3 fatty acids 1000 MG capsule Take 2  g by mouth daily.  . metoprolol tartrate (LOPRESSOR) 25 MG tablet Take 12.5 mg by mouth 2 (two) times daily.  . Multiple Vitamin (MULTIVITAMIN) capsule Take 1 capsule by mouth daily.  . Naproxen Sodium (ALEVE PO) Take 1 tablet by mouth daily.  :  Review of Systems:  Out of a complete 14 point review of systems, all are reviewed and negative with the exception of these symptoms as listed below:  Review of Systems  Constitutional: Negative.   HENT: Negative.   Eyes: Negative.   Respiratory: Negative.   Cardiovascular: Negative.   Gastrointestinal: Negative.   Endocrine: Negative.   Genitourinary: Negative.   Musculoskeletal: Negative.   Skin: Negative.   Allergic/Immunologic: Negative.   Neurological: Negative.   Hematological: Negative.   Psychiatric/Behavioral: Negative.   All other systems reviewed and are  negative.   Objective:  Neurologic Exam  Physical Exam Physical Examination:   Filed Vitals:   03/24/14 1443  BP: 123/58  Pulse: 60  Temp: 96.6 F (35.9 C)   General Examination: The patient is a very pleasant 80 y.o. male in no acute distress. He appears well-developed and well-nourished and well groomed.   HEENT: Normocephalic, atraumatic, pupils are equal, round and reactive to light and accommodation. Funduscopic exam is normal with sharp disc margins noted. He is s/p cataract repairs b/l. Extraocular tracking is good without limitation to gaze excursion or nystagmus noted. Normal smooth pursuit is noted. Hearing is grossly intact. Face is symmetric with normal facial animation and normal facial sensation. Speech is clear with no dysarthria noted. There is no hypophonia. There is no lip, neck/head, jaw or voice tremor. Neck is supple with full range of passive and active motion. There are no carotid bruits on auscultation. Oropharynx exam reveals: mild mouth dryness, adequate dental hygiene and mild airway crowding, due to redundant soft palate. Mallampati is class II. Tongue protrudes centrally and palate elevates symmetrically.   Chest: Clear to auscultation without wheezing, rhonchi or crackles noted.  Heart: S1+S2+0, irregular without murmurs, rubs or gallops noted.   Abdomen: Soft, non-tender and non-distended with normal bowel sounds appreciated on auscultation.  Extremities: There is trace pitting edema around both ankles. He was wearing long knee-high socks and his feet are slightly pale and colder. His pedal pulses are faintly palpable.   Skin: Warm and dry without trophic changes noted. There are no varicose veins, but he does have some spider veins. He has some bruising on his forearms.   Musculoskeletal: exam reveals no obvious joint deformities, tenderness or joint swelling or erythema.   Neurologically:  Mental status: The patient is awake, alert and oriented in all  4 spheres. His memory, attention, language and knowledge are appropriate. There is no aphasia, agnosia, apraxia or anomia. Speech is clear with normal prosody and enunciation. Thought process is linear. Mood is congruent and affect is normal. He is very pleasant and conversant. Cranial nerves are as described above under HEENT exam. In addition, shoulder shrug is normal with equal shoulder height noted. Motor exam: Normal bulk, strength and tone is noted. There is no drift, tremor or rebound. Romberg is positive. Reflexes are 1+ in the upper extremities, 1+ in both knees and absent in both ankles. Fine motor skills are intact with normal finger taps, normal hand movements, normal rapid alternating patting, normal foot taps and normal foot agility.  Cerebellar testing shows no dysmetria or intention tremor on finger to nose testing. Heel to shin is unremarkable bilaterally. There is no truncal  ataxia.  Sensory exam is intact to light touch, pinprick, vibration, temperature sense in the upper extremities, and decrease in pinprick sensation, temperature and vibration in the distal lower extremities. On the right side it is up to mid shin area and on the left it is a hand width above his ankle. This is unchanged.   Gait, station and balance: He stands up with no difficulty but his stance is mildly wide-based. He walks cautiously and mildly wide-based. He turns well. Tandem walk is not possible.   Assessment and Plan:   In summary, KONGMENG SANTORO is a very pleasant 80 year old male with an underlying medical history of heart disease, s/p angioplasty in 1991 and then 2 vessel CABG in 2012, hyperlipidemia, a fib, osteoporosis, osteoarthritis and BPH, who presents for follow up of his neuropathy and mild sensory ataxia noted. He has a stable exam and may have had a TIA in 4/15, but is doing well. He is on Plavix without complications. His EMG and nerve conduction testing confirmed peripheral neuropathy in the  past. We checked heavy metals, which were fine. He has no significant pain.  I had a long chat with the patient about my findings and the diagnosis of PN, the prognosis and treatment options. We talked about medical treatments and non-pharmacological approaches. We talked about trying to maintain a healthy lifestyle in general and we again talked about secondary stroke prevention and risk for dehydration. I encouraged the patient to eat healthy, exercise daily and keep well hydrated, to keep a scheduled bedtime and wake time routine, to not skip any meals and eat healthy snacks in between meals and to have protein with every meal. I also warned him again about getting overheated and dehydrated and would like for him to be extra cautious. He is again advised against climbing ladders or be at heights. He is advised to followup with me in about 6 months, sooner if the need arises and is encouraged him to call with any interim questions.

## 2014-03-28 ENCOUNTER — Telehealth (HOSPITAL_COMMUNITY): Payer: Self-pay | Admitting: *Deleted

## 2014-03-31 ENCOUNTER — Ambulatory Visit: Payer: Medicare Other | Admitting: Neurology

## 2014-04-05 ENCOUNTER — Ambulatory Visit (HOSPITAL_COMMUNITY)
Admission: RE | Admit: 2014-04-05 | Discharge: 2014-04-05 | Disposition: A | Discharge status: Home / Self Care | Payer: Medicare Other | Source: Ambulatory Visit | Attending: Internal Medicine | Admitting: Internal Medicine

## 2014-04-05 DIAGNOSIS — I4891 Unspecified atrial fibrillation: Secondary | ICD-10-CM | POA: Diagnosis not present

## 2014-04-05 DIAGNOSIS — I2510 Atherosclerotic heart disease of native coronary artery without angina pectoris: Secondary | ICD-10-CM | POA: Diagnosis not present

## 2014-04-05 DIAGNOSIS — I359 Nonrheumatic aortic valve disorder, unspecified: Secondary | ICD-10-CM

## 2014-04-05 DIAGNOSIS — Z8673 Personal history of transient ischemic attack (TIA), and cerebral infarction without residual deficits: Secondary | ICD-10-CM | POA: Diagnosis not present

## 2014-04-05 DIAGNOSIS — I428 Other cardiomyopathies: Secondary | ICD-10-CM | POA: Insufficient documentation

## 2014-04-05 DIAGNOSIS — I351 Nonrheumatic aortic (valve) insufficiency: Secondary | ICD-10-CM

## 2014-04-05 DIAGNOSIS — I420 Dilated cardiomyopathy: Secondary | ICD-10-CM

## 2014-04-05 NOTE — Progress Notes (Signed)
2D Echo Performed 04/05/2014    Marygrace Drought, RCS

## 2014-04-06 NOTE — Progress Notes (Signed)
Quick Note:  Echo results: Good news: The ejection fraction is back to baseline 55-60%. The aortic insufficiency is still present and that is mild to moderate. Will follow every couple years. Otherwise relatively normal with only mild abnormalities.   [name], MD    ______

## 2014-04-22 ENCOUNTER — Encounter (HOSPITAL_COMMUNITY): Payer: Self-pay | Admitting: Emergency Medicine

## 2014-04-22 ENCOUNTER — Emergency Department (HOSPITAL_COMMUNITY)
Admission: EM | Admit: 2014-04-22 | Discharge: 2014-04-22 | Disposition: A | Discharge status: Home / Self Care | Payer: Medicare Other | Attending: Emergency Medicine | Admitting: Emergency Medicine

## 2014-04-22 DIAGNOSIS — Z951 Presence of aortocoronary bypass graft: Secondary | ICD-10-CM | POA: Diagnosis not present

## 2014-04-22 DIAGNOSIS — M810 Age-related osteoporosis without current pathological fracture: Secondary | ICD-10-CM | POA: Insufficient documentation

## 2014-04-22 DIAGNOSIS — Z87442 Personal history of urinary calculi: Secondary | ICD-10-CM | POA: Diagnosis not present

## 2014-04-22 DIAGNOSIS — Z9889 Other specified postprocedural states: Secondary | ICD-10-CM | POA: Diagnosis not present

## 2014-04-22 DIAGNOSIS — Z79899 Other long term (current) drug therapy: Secondary | ICD-10-CM | POA: Diagnosis not present

## 2014-04-22 DIAGNOSIS — R4182 Altered mental status, unspecified: Secondary | ICD-10-CM | POA: Insufficient documentation

## 2014-04-22 DIAGNOSIS — Z8669 Personal history of other diseases of the nervous system and sense organs: Secondary | ICD-10-CM | POA: Diagnosis not present

## 2014-04-22 DIAGNOSIS — E7800 Pure hypercholesterolemia, unspecified: Secondary | ICD-10-CM | POA: Insufficient documentation

## 2014-04-22 DIAGNOSIS — G454 Transient global amnesia: Secondary | ICD-10-CM | POA: Diagnosis not present

## 2014-04-22 DIAGNOSIS — Z7902 Long term (current) use of antithrombotics/antiplatelets: Secondary | ICD-10-CM | POA: Insufficient documentation

## 2014-04-22 DIAGNOSIS — I4891 Unspecified atrial fibrillation: Secondary | ICD-10-CM | POA: Insufficient documentation

## 2014-04-22 DIAGNOSIS — Z87891 Personal history of nicotine dependence: Secondary | ICD-10-CM | POA: Insufficient documentation

## 2014-04-22 DIAGNOSIS — I2510 Atherosclerotic heart disease of native coronary artery without angina pectoris: Secondary | ICD-10-CM | POA: Insufficient documentation

## 2014-04-22 DIAGNOSIS — Z8673 Personal history of transient ischemic attack (TIA), and cerebral infarction without residual deficits: Secondary | ICD-10-CM | POA: Insufficient documentation

## 2014-04-22 DIAGNOSIS — Z791 Long term (current) use of non-steroidal anti-inflammatories (NSAID): Secondary | ICD-10-CM | POA: Insufficient documentation

## 2014-04-22 HISTORY — DX: Transient cerebral ischemic attack, unspecified: G45.9

## 2014-04-22 LAB — COMPREHENSIVE METABOLIC PANEL
ALK PHOS: 45 U/L (ref 39–117)
ALT: 26 U/L (ref 0–53)
AST: 34 U/L (ref 0–37)
Albumin: 4.3 g/dL (ref 3.5–5.2)
Anion gap: 14 (ref 5–15)
BUN: 22 mg/dL (ref 6–23)
CHLORIDE: 100 meq/L (ref 96–112)
CO2: 27 mEq/L (ref 19–32)
Calcium: 9.7 mg/dL (ref 8.4–10.5)
Creatinine, Ser: 0.82 mg/dL (ref 0.50–1.35)
GFR, EST AFRICAN AMERICAN: 89 mL/min — AB (ref >90)
GFR, EST NON AFRICAN AMERICAN: 77 mL/min — AB (ref >90)
GLUCOSE: 119 mg/dL — AB (ref 70–99)
POTASSIUM: 4.1 meq/L (ref 3.7–5.3)
Sodium: 141 mEq/L (ref 137–147)
Total Bilirubin: 1.8 mg/dL — ABNORMAL HIGH (ref 0.3–1.2)
Total Protein: 7.5 g/dL (ref 6.0–8.3)

## 2014-04-22 LAB — URINALYSIS, ROUTINE W REFLEX MICROSCOPIC
Bilirubin Urine: NEGATIVE
Glucose, UA: NEGATIVE mg/dL
HGB URINE DIPSTICK: NEGATIVE
Ketones, ur: NEGATIVE mg/dL
Leukocytes, UA: NEGATIVE
Nitrite: NEGATIVE
PROTEIN: NEGATIVE mg/dL
Specific Gravity, Urine: 1.016 (ref 1.005–1.030)
UROBILINOGEN UA: 0.2 mg/dL (ref 0.0–1.0)
pH: 6.5 (ref 5.0–8.0)

## 2014-04-22 LAB — CBC
HCT: 41 % (ref 39.0–52.0)
HEMOGLOBIN: 14 g/dL (ref 13.0–17.0)
MCH: 32.3 pg (ref 26.0–34.0)
MCHC: 34.1 g/dL (ref 30.0–36.0)
MCV: 94.7 fL (ref 78.0–100.0)
Platelets: 126 10*3/uL — ABNORMAL LOW (ref 150–400)
RBC: 4.33 MIL/uL (ref 4.22–5.81)
RDW: 12.1 % (ref 11.5–15.5)
WBC: 6.2 10*3/uL (ref 4.0–10.5)

## 2014-04-22 NOTE — Discharge Instructions (Signed)
Transient Global Amnesia Your exam shows you may have a rare problem that causes temporary amnesia, an inability to remember what has happened in the past several hours or day. Transient global amnesia (TGA) means you cannot remember recent events, even though you may look and act normally. There are no physical problems in TGA; your vision, strength, coordination, and sensations are all normal. TGA occurs most often in older patients, and in patients with high blood pressure. The exact cause of TGA is not known, although it is thought to be due to vascular disease in your brain. There is usually a complete return to normal memory capacity after an episode is over. About 20-30% of patients with TGA will have more than one episode, and some studies show a slight increased risk for stroke. Although no special treatment is needed, taking up to one adult aspirin daily reduces the risk of having a stroke. You should consider taking aspirin daily if you are not allergic to it. Medical evaluation may require specialized scans to check for stroke or other brain problems, an EEG (brain wave test), or blood tests. Avoid alcohol or any sedating medicines until you are completely recovered. Call your doctor right away if your memory is not fully recovered after 24 hours, or if you have any other serious problems including:  Severe headache, nausea, vomiting, fever, or other symptoms of an infection.  Weakness, numbness, difficulty with movement, or incoordination.  Blurred or double vision, unusual sleepiness, seizures, or fainting. Document Released: 09/26/2004 Document Revised: 11/11/2011 Document Reviewed: 08/19/2005 [facility] Patient Information 2015 [place], [place]. This information is not intended to replace advice given to you by your health care provider. Make sure you discuss any questions you have with your health care provider.   Not every illness or injury can be identified during an emergency department  visit, thus follow-up with your primary healthcare provider is important. Medical conditions can also worsen, so it is also important to return immediately as directed below, or if you have other serious concerns develop. RETURN IMMEDIATELY and call 911 IF you develop new shortness of breath, chest pain, fever, have difficulty moving parts of your body (new weakness, numbness, or incoordination), sudden change in speech, vision, swallowing, or understanding, faint or develop new dizziness, severe headache, become poorly responsive or have an altered mental status compared to baseline for you, new rash, abdominal pain, or bloody stools,  Return sooner also if you develop new problems for which you have not talked to your caregiver but you feel may be emergency medical conditions, or are unable to be cared for safely at home.

## 2014-04-22 NOTE — ED Provider Notes (Signed)
CSN: [id number]     Arrival date & time 04/22/14  1409 History   First MD Initiated Contact with Patient 04/22/14 1514     Chief Complaint  Patient presents with  . Altered Mental Status     (Consider location/radiation/quality/duration/timing/severity/associated sxs/prior Treatment) HPI 80 year old male at baseline now. Last known well at 0830 this morning. Called his wife about noon today and apparently had some repetitive questioning with amnesia for the event. Unknown when the event started unknown how long the event lasted possibly a few hours now resolved. Patient has no complaints and is back to baseline.  Patient has had a prior episode of transient global amnesia has also had prior admission for possible TIA within the last 6 months with unremarkable MRI. He also had carotid ultrasound and echocardiogram within the last 6 months.  At baseline patient has mild peripheral neuropathy with mild gait sensory ataxia which is stable today.   Patient is no headache no confusion now no change in speech swallowing vision or understanding or lateralizing weakness numbness or incoordination or vertigo. He denies lightheadedness chest pain palpitations shortness breath or other concerns.  Past Medical History  Diagnosis Date  . Osteoporosis   . Hypercholesteremia   . Tremors of nervous system     hands  . CAD (coronary artery disease), native coronary artery 1991; 2012    PCI - LAD; Cath in 2012 revealed severe RCA & LAD Diseaes -- CABG x 2  . AF (atrial fibrillation) -- Post Operative   . Neuropathy of lower extremity   . Nephrolithiasis   . Dilated idiopathic cardiomyopathy April 2015     in setting of TIA: EF 35-40% (reduced from 55-65 percent) mild AI, moderate and dilated aorta.  [name] Kitchen TIA (transient ischemic attack)    Past Surgical History  Procedure Laterality Date  . Coronary artery bypass graft  10/2010    LIMA-LAD, SVG-rPDA  . Doppler echocardiography  10/18/2004    EF  55-65%,BORDERLINE -enlarged aortic root,mild aortic insuff.,trace tricus.insuff.  . Nm myocar perf wall motion  07/24/1998    EF 71%  . Cardiac catheterization  10/17/2010    80%prox w/mild in-stent restenosis w/75% mid-LADstenosis aft the stent,multi severe stenoses RCAw/heavily calcified vessel,norm LV  . Tonsillectomy    . Appendectomy    . Transthoracic echocardiogram  April 2015    EF 35-40%, mild AI, moderately dilated ascending aorta  . Nm myoview ltd  12/16/2013    INTERMEDIATE RISK test with small sized reversible perfusion defect in the apical anterolateral wall -- most likely consistent with known occlusion of D1 with collaterals.   Family History  Problem Relation Age of Onset  . Heart Problems Mother   . Parkinson's disease Father   . Heart Problems Father    History  Substance Use Topics  . Smoking status: Former Smoker -- 1.00 packs/day for 5 years    Types: Cigarettes    Quit date: 02/08/1938  . Smokeless tobacco: Former Systems developer  . Alcohol Use: No    Review of Systems  10 Systems reviewed and are negative for acute change except as noted in the HPI.  Allergies  Review of patient's allergies indicates no known allergies.  Home Medications   Prior to Admission medications   Medication Sig Start Date End Date Taking? Authorizing Provider  atorvastatin (LIPITOR) 80 MG tablet Take 80 mg by mouth daily.   Yes Historical Provider, MD  calcium-vitamin D (OSCAL WITH D) 500-200 MG-UNIT per tablet Take 1 tablet by  mouth daily.    Yes Historical Provider, MD  clopidogrel (PLAVIX) 75 MG tablet Take 1 tablet (75 mg total) by mouth daily with breakfast. 03/08/14  Yes [name], MD  fish oil-omega-3 fatty acids 1000 MG capsule Take 1 g by mouth daily.    Yes Historical Provider, MD  metoprolol tartrate (LOPRESSOR) 25 MG tablet Take 12.5 mg by mouth 2 (two) times daily.   Yes Historical Provider, MD  Multiple Vitamin (MULTIVITAMIN) capsule Take 1 capsule by mouth daily.    Yes Historical Provider, MD  naproxen sodium (ANAPROX) 220 MG tablet Take 220 mg by mouth daily.   Yes Historical Provider, MD   BP 128/85  Pulse 64  Temp(Src) 97.6 F (36.4 C) (Oral)  Resp 17  SpO2 98% Physical Exam  Nursing note and vitals reviewed. Constitutional: He is oriented to person, place, and time.  Awake, alert, nontoxic appearance with baseline speech for patient.  HENT:  Head: Atraumatic.  Mouth/Throat: No oropharyngeal exudate.  Eyes: EOM are normal. Pupils are equal, round, and reactive to light. Right eye exhibits no discharge. Left eye exhibits no discharge.  Neck: Neck supple.  Cardiovascular: Normal rate and regular rhythm.   No murmur heard. Pulmonary/Chest: Effort normal and breath sounds normal. No stridor. No respiratory distress. He has no wheezes. He has no rales. He exhibits no tenderness.  Abdominal: Soft. Bowel sounds are normal. He exhibits no mass. There is no tenderness. There is no rebound.  Musculoskeletal: He exhibits no tenderness.  Baseline ROM, moves extremities with no obvious new focal weakness.  Lymphadenopathy:    He has no cervical adenopathy.  Neurological: He is alert and oriented to person, place, and time.  Awake, alert, cooperative and aware of situation; motor strength bilaterally; sensation normal to light touch bilaterally except for baseline mild decreased light touch in both lower legs; peripheral visual fields full to confrontation; no facial asymmetry; tongue midline; major cranial nerves appear intact; no pronator drift, normal finger to nose bilaterally, baseline gait with mild baseline ataxia without new ataxia.  Skin: No rash noted.  Psychiatric: He has a normal mood and affect.    ED Course  Procedures (including critical care time) [facility] no TIA/CVA eval likely transient global amnesia episode now resolved. Patient / Family / Caregiver informed of clinical course, understand medical decision-making  process, and agree with plan.Pt stable in ED with no significant deterioration in condition. Labs Review Labs Reviewed  CBC - Abnormal; Notable for the following:    Platelets 126 (*)    All other components within normal limits  COMPREHENSIVE METABOLIC PANEL - Abnormal; Notable for the following:    Glucose, Bld 119 (*)    Total Bilirubin 1.8 (*)    GFR calc non Af Amer 77 (*)    GFR calc Af Amer 89 (*)    All other components within normal limits  URINALYSIS, ROUTINE W REFLEX MICROSCOPIC    Imaging Review No results found.   EKG Interpretation None      MDM   Final diagnoses:  Transient global amnesia    I doubt any other EMC precluding discharge at this time including, but not necessarily limited to the following:CVA.    Babette Relic, MD 05/03/14 2053

## 2014-04-22 NOTE — ED Notes (Addendum)
Pt arrived by [facility] from [employer]. Pt was at drug store and staff at drug store know pt very well and stated that he was not acting right. Pt was confused and stated that he was a little dizzy. EMS arrived and stated that he was fine so they called his wife and asked her what she thought and she stated that a couple of months ago he had the same type thing happen and they dx with TIA. Pt has been repeating some things that he has been saying and wife stated that it was not normal for him. Wife stated that he left the house around 0830 this morning and symptoms were noted around 1245. Afib on the monitor with hx of afib.

## 2014-06-13 ENCOUNTER — Telehealth: Payer: Self-pay | Admitting: Cardiology

## 2014-06-13 DIAGNOSIS — I420 Dilated cardiomyopathy: Secondary | ICD-10-CM

## 2014-06-13 NOTE — Telephone Encounter (Signed)
Will forward to dr harding

## 2014-06-13 NOTE — Telephone Encounter (Signed)
PATIENT HAD AN ECHO IN 8/15 AND MYOVIEW IN  4/15

## 2014-06-13 NOTE — Telephone Encounter (Signed)
Pam called in stating that Dr. [name] would like for the patient to have surgical clearance within the next couple of weeks because he will be having a bladder tumor removed. Please call  Thanks

## 2014-06-14 NOTE — Telephone Encounter (Signed)
I have not seen Tanner [name] since July.  At that time he was not having any active angina or CHF symptoms.  The planned surgery is relatively Low Risk, and he is relatively symptom free. His CHA2DS2VAsc Score is (+ h/o CHF -1 , HTN- 1, > 78 y/o - 2, DM-o, Stroke/TIA - 2, Vasc with Prior MI - 1) = 7 which makes him a HIGH RISK patient for any non-cardiac procedure.  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  No further evaluation was done due to his lack of symptoms.  With his lack of symptoms with > 4 METS activity level - I don't think any additional ischemic evaluation would be helpful, but an Echocardiogram to reassess his EF would be reasonable.     [name], MD

## 2014-06-14 NOTE — Telephone Encounter (Signed)
Spoke with pt wife, aware we will be scheduling an echo for clearance. Left message for pam regarding above. Orders placed and sent to scheduling to contact the patient.

## 2014-06-15 ENCOUNTER — Inpatient Hospital Stay (HOSPITAL_COMMUNITY): Admission: RE | Admit: 2014-06-15 | Payer: Medicare Other | Source: Ambulatory Visit

## 2014-06-15 ENCOUNTER — Other Ambulatory Visit: Payer: Self-pay | Admitting: Urology

## 2014-06-15 ENCOUNTER — Encounter: Payer: Self-pay | Admitting: *Deleted

## 2014-06-15 ENCOUNTER — Telehealth: Payer: Self-pay | Admitting: Cardiology

## 2014-06-15 NOTE — Telephone Encounter (Signed)
Spoke with Dr Ellyn Hack Patient is an intermediate risk for intermediate surgery. Hold Plavix for 5 days prior to surgery. Will notify patient and send clearance to  Dr [name]

## 2014-06-15 NOTE — Telephone Encounter (Signed)
LATE  ENTRY  PATIENT IS AWARE

## 2014-06-15 NOTE — Telephone Encounter (Signed)
Tanner [name] has had a Myoview stress test that revealed a possible anterolateral ischemia that goes along with his known occluded diagonal branch. He has not had any additional anginal symptoms since the stress test. He had a followup echocardiogram showed recovery of his ejection fraction.  He does not have insulin-dependent diabetes, has not had a stroke, and has normal renal function. He has no active heart failure symptoms.  Based on his known coronary disease with mild ischemia on stress test, despite having symptoms so considered to be an intermediate risk patient for intermediate risk surgery.  At this point he should be on proceed with surgery without any further cardiac evaluation. He'll continue his beta blocker.  He will hold his Plavix for 5 days preoperatively and restart one to 2 days postoperatively when safe from a surgical standpoint.   [name], M.D., M.S. Interventional Cardiologist   Pager # [phone]

## 2014-06-15 NOTE — Telephone Encounter (Signed)
Please call asap,concerning his surgery.

## 2014-06-15 NOTE — Telephone Encounter (Signed)
Patient will NOT need echo - per Ivin Booty, RN and Dr. Ellyn Hack - Clearance will need to be sent to Dr. [name]

## 2014-06-15 NOTE — Telephone Encounter (Signed)
SPOKE TO WIFE  SHE WILL GIVE HIM THE MESSAGE TO CALL BACK

## 2014-07-03 HISTORY — PX: BASAL CELL CARCINOMA EXCISION: SHX1214

## 2014-07-05 NOTE — Patient Instructions (Addendum)
Tanner [name]  07/05/2014   Your procedure is scheduled on:07/12/2014      Come thru the [place] Entrance.   Follow the Signs to [place] at  1030      am  Call this number if you have problems the morning of surgery: [phone]   Remember:   Do not eat food or drink liquids after midnight.   Take these medicines the morning of surgery with A SIP OF WATER: Metoprolol ( Lopressor )    Do not wear jewelry,   Do not wear lotions, powders, or perfumes deodorant.  . Men may shave face and neck.  Do not bring valuables to the hospital.  Contacts, dentures or bridgework may not be worn into surgery.      Patients discharged the day of surgery will not be allowed to drive  home.  Name and phone number of your driver: wife      Please read over the following fact sheets that you were given: MRSA Information, coughing and deep breathing exercises, leg exercises            [facility] - Preparing for Surgery Before surgery, you can play an important role.  Because skin is not sterile, your skin needs to be as free of germs as possible.  You can reduce the number of germs on your skin by washing with CHG (chlorahexidine gluconate) soap before surgery.  CHG is an antiseptic cleaner which kills germs and bonds with the skin to continue killing germs even after washing. Please DO NOT use if you have an allergy to CHG or antibacterial soaps.  If your skin becomes reddened/irritated stop using the CHG and inform your nurse when you arrive at Short Stay. Do not shave (including legs and underarms) for at least 48 hours prior to the first CHG shower.  You may shave your face/neck. Please follow these instructions carefully:  1.  Shower with CHG Soap the night before surgery and the  morning of Surgery.  2.  If you choose to wash your hair, wash your hair first as usual with your  normal  shampoo.  3.  After you shampoo, rinse your hair and body thoroughly to remove the  shampoo.                            4.  Use CHG as you would any other liquid soap.  You can apply chg directly  to the skin and wash                       Gently with a scrungie or clean washcloth.  5.  Apply the CHG Soap to your body ONLY FROM THE NECK DOWN.   Do not use on face/ open                           Wound or open sores. Avoid contact with eyes, ears mouth and genitals (private parts).                       Wash face,  Genitals (private parts) with your normal soap.             6.  Wash thoroughly, paying special attention to the area where your surgery  will be performed.  7.  Thoroughly rinse your body with warm water from the  neck down.  8.  DO NOT shower/wash with your normal soap after using and rinsing off  the CHG Soap.                9.  Pat yourself dry with a clean towel.            10.  Wear clean pajamas.            11.  Place clean sheets on your bed the night of your first shower and do not  sleep with pets. Day of Surgery : Do not apply any lotions/deodorants the morning of surgery.  Please wear clean clothes to the hospital/surgery center.  FAILURE TO FOLLOW THESE INSTRUCTIONS MAY RESULT IN THE CANCELLATION OF YOUR SURGERY PATIENT SIGNATURE_________________________________  NURSE SIGNATURE__________________________________  ________________________________________________________________________

## 2014-07-06 ENCOUNTER — Encounter (HOSPITAL_COMMUNITY): Payer: Self-pay

## 2014-07-06 ENCOUNTER — Encounter (HOSPITAL_COMMUNITY)
Admission: RE | Admit: 2014-07-06 | Discharge: 2014-07-06 | Disposition: A | Discharge status: Home / Self Care | Payer: Medicare Other | Source: Ambulatory Visit | Attending: Urology | Admitting: Urology

## 2014-07-06 ENCOUNTER — Encounter (INDEPENDENT_AMBULATORY_CARE_PROVIDER_SITE_OTHER): Payer: Self-pay

## 2014-07-06 DIAGNOSIS — Z01812 Encounter for preprocedural laboratory examination: Secondary | ICD-10-CM | POA: Insufficient documentation

## 2014-07-06 HISTORY — DX: Unspecified osteoarthritis, unspecified site: M19.90

## 2014-07-06 LAB — CBC
HEMATOCRIT: 37.9 % — AB (ref 39.0–52.0)
Hemoglobin: 12.5 g/dL — ABNORMAL LOW (ref 13.0–17.0)
MCH: 31.6 pg (ref 26.0–34.0)
MCHC: 33 g/dL (ref 30.0–36.0)
MCV: 95.7 fL (ref 78.0–100.0)
PLATELETS: 120 10*3/uL — AB (ref 150–400)
RBC: 3.96 MIL/uL — ABNORMAL LOW (ref 4.22–5.81)
RDW: 12.3 % (ref 11.5–15.5)
WBC: 3.9 10*3/uL — ABNORMAL LOW (ref 4.0–10.5)

## 2014-07-06 LAB — BASIC METABOLIC PANEL
ANION GAP: 11 (ref 5–15)
BUN: 25 mg/dL — AB (ref 6–23)
CALCIUM: 9.7 mg/dL (ref 8.4–10.5)
CO2: 26 mEq/L (ref 19–32)
Chloride: 103 mEq/L (ref 96–112)
Creatinine, Ser: 0.88 mg/dL (ref 0.50–1.35)
GFR calc Af Amer: 86 mL/min — ABNORMAL LOW (ref >90)
GFR calc non Af Amer: 74 mL/min — ABNORMAL LOW (ref >90)
Glucose, Bld: 111 mg/dL — ABNORMAL HIGH (ref 70–99)
Potassium: 4.2 mEq/L (ref 3.7–5.3)
Sodium: 140 mEq/L (ref 137–147)

## 2014-07-06 LAB — SURGICAL PCR SCREEN
MRSA, PCR: POSITIVE — AB
Staphylococcus aureus: POSITIVE — AB

## 2014-07-06 NOTE — Progress Notes (Signed)
CBC, BMET AND SURGICAL SCREEN RESULTS FAXED TO DR [place]

## 2014-07-06 NOTE — Progress Notes (Addendum)
EKG- 03/14/14 EPIC  Ct angio chest 12/2013   EPIC  LOV note DR Ellyn Hack 03/14/2014   EPIC  Stress 12/2013 EPIC  Telephone note clearance- 06/15/14   EPIC  ECHO- 04/05/2014  EPIC

## 2014-07-11 NOTE — H&P (Signed)
H&P  Chief Complaint: Pt presents for cysto, TURBT, bilat RGP, MMC.   History of Present Illness: He has been well. Gross hematuria resolved. No dysuria, CP or SOB. Dr. Ellyn Hack cleared pre-op with Myoview stress test and approved stopping plavix.   Prior GU hx:  1- microscopic hematuria -  referred by Dr. Noah Delaine.  A urinalysis showed 5-10 red blood cells per high-powered field on 2 occasions.  BUN was 17, creatinine 0.95.  Patient has a history of kidney stones remotely.  He's never smoked.  No exposure to chemicals, chemotherapy or radiation.  He developed gross hematuria.  Voiding red urine, no clots.  No bladder pain.  No flank pain.  He typically voids with a good stream.  He has no frequency or urgency.  He has nocturia 2.  He has no history of BPH.  Patient has had prior PSA screening.  September 2014 PSA was 2.1.   Noncontrast CT reveals a mass on the right side of the bladder.     Past Medical History  Diagnosis Date  . Osteoporosis   . Hypercholesteremia   . Tremors of nervous system     hands  . CAD (coronary artery disease), native coronary artery 1991; 2012    PCI - LAD; Cath in 2012 revealed severe RCA & LAD Diseaes -- CABG x 2  . AF (atrial fibrillation) -- Post Operative   . Neuropathy of lower extremity   . Nephrolithiasis   . Dilated idiopathic cardiomyopathy April 2015     in setting of TIA: EF 35-40% (reduced from 55-65 percent) mild AI, moderate and dilated aorta.  [name] Kitchen TIA (transient ischemic attack)   . Arthritis     right shoulder    Past Surgical History  Procedure Laterality Date  . Coronary artery bypass graft  10/2010    LIMA-LAD, SVG-rPDA  . Doppler echocardiography  10/18/2004    EF 55-65%,BORDERLINE -enlarged aortic root,mild aortic insuff.,trace tricus.insuff.  . Nm myocar perf wall motion  07/24/1998    EF 71%  . Cardiac catheterization  10/17/2010    80%prox w/mild in-stent restenosis w/75% mid-LADstenosis aft the stent,multi severe  stenoses RCAw/heavily calcified vessel,norm LV  . Tonsillectomy    . Appendectomy    . Transthoracic echocardiogram  April 2015    EF 35-40%, mild AI, moderately dilated ascending aorta  . Nm myoview ltd  12/16/2013    INTERMEDIATE RISK test with small sized reversible perfusion defect in the apical anterolateral wall -- most likely consistent with known occlusion of D1 with collaterals.    Home Medications:  No prescriptions prior to admission   Allergies: No Known Allergies  Family History  Problem Relation Age of Onset  . Heart Problems Mother   . Parkinson's disease Father   . Heart Problems Father    Social History:  reports that he quit smoking about 76 years ago. His smoking use included Cigarettes. He has a 5 pack-year smoking history. He has never used smokeless tobacco. He reports that he does not drink alcohol or use illicit drugs.  ROS: A complete review of systems was performed.  All systems are negative except for pertinent findings as noted. Review of Systems  All other systems reviewed and are negative.    Physical Exam:  Vital signs in last 24 hours:   General:  Alert and oriented, No acute distress HEENT: Normocephalic, atraumatic Neck: No JVD or lymphadenopathy Cardiovascular: Regular rate and rhythm Lungs: Regular rate and effort Abdomen: Soft, nontender, nondistended, no abdominal  masses Back: No CVA tenderness Extremities: No edema Neurologic: Grossly intact  Laboratory Data:  No results found for this or any previous visit (from the past 24 hour(s)). Recent Results (from the past 240 hour(s))  Surgical pcr screen     Status: Abnormal   Collection Time: 07/06/14  8:25 AM  Result Value Ref Range Status   MRSA, PCR POSITIVE (A) NEGATIVE Final    Comment: RESULT CALLED TO, READ BACK BY AND VERIFIED WITH: Franz Dell RN AT 1130 ON 11.4.15 BY SHUEA    Staphylococcus aureus POSITIVE (A) NEGATIVE Final    Comment:        The Xpert SA Assay  (FDA approved for NASAL specimens in patients over 31 years of age), is one component of a comprehensive surveillance program.  Test performance has been validated by [employer] for patients greater than or equal to 45 year old. It is not intended to diagnose infection nor to guide or monitor treatment.    Creatinine:  Recent Labs  07/06/14 0835  CREATININE 0.88    Impression/Assessment/plan: I discussed with the patient the nature, potential benefits, risks and alternatives to cysto, bilateral RGP, TURBT with possible post-op MMC, including side effects of the proposed treatment, the likelihood of the patient achieving the goals of the procedure, and any potential problems that might occur during the procedure or recuperation. All questions answered. Patient elects to proceed. We also discussed need for prolonged foley as well as need for staged procedure not uncommon.    [name]

## 2014-07-12 ENCOUNTER — Ambulatory Visit (HOSPITAL_COMMUNITY): Payer: Medicare Other | Admitting: Anesthesiology

## 2014-07-12 ENCOUNTER — Encounter (HOSPITAL_COMMUNITY): Payer: Self-pay | Admitting: *Deleted

## 2014-07-12 ENCOUNTER — Other Ambulatory Visit: Payer: Self-pay

## 2014-07-12 ENCOUNTER — Encounter (HOSPITAL_COMMUNITY)
Admission: RE | Disposition: A | Discharge status: Home / Self Care | Payer: Self-pay | Source: Ambulatory Visit | Attending: Urology

## 2014-07-12 ENCOUNTER — Observation Stay (HOSPITAL_COMMUNITY)
Admission: RE | Admit: 2014-07-12 | Discharge: 2014-07-13 | Disposition: A | Discharge status: Home / Self Care | Payer: Medicare Other | Source: Ambulatory Visit | Attending: Urology | Admitting: Urology

## 2014-07-12 DIAGNOSIS — C679 Malignant neoplasm of bladder, unspecified: Secondary | ICD-10-CM | POA: Diagnosis present

## 2014-07-12 DIAGNOSIS — Z951 Presence of aortocoronary bypass graft: Secondary | ICD-10-CM | POA: Diagnosis not present

## 2014-07-12 DIAGNOSIS — E780 Pure hypercholesterolemia: Secondary | ICD-10-CM | POA: Insufficient documentation

## 2014-07-12 DIAGNOSIS — N359 Urethral stricture, unspecified: Secondary | ICD-10-CM | POA: Diagnosis not present

## 2014-07-12 DIAGNOSIS — Z87891 Personal history of nicotine dependence: Secondary | ICD-10-CM | POA: Insufficient documentation

## 2014-07-12 DIAGNOSIS — R001 Bradycardia, unspecified: Secondary | ICD-10-CM | POA: Insufficient documentation

## 2014-07-12 DIAGNOSIS — M810 Age-related osteoporosis without current pathological fracture: Secondary | ICD-10-CM | POA: Insufficient documentation

## 2014-07-12 DIAGNOSIS — I2510 Atherosclerotic heart disease of native coronary artery without angina pectoris: Secondary | ICD-10-CM | POA: Diagnosis not present

## 2014-07-12 DIAGNOSIS — D494 Neoplasm of unspecified behavior of bladder: Secondary | ICD-10-CM | POA: Diagnosis present

## 2014-07-12 DIAGNOSIS — Z8673 Personal history of transient ischemic attack (TIA), and cerebral infarction without residual deficits: Secondary | ICD-10-CM | POA: Diagnosis not present

## 2014-07-12 DIAGNOSIS — I25810 Atherosclerosis of coronary artery bypass graft(s) without angina pectoris: Secondary | ICD-10-CM

## 2014-07-12 DIAGNOSIS — I429 Cardiomyopathy, unspecified: Secondary | ICD-10-CM | POA: Insufficient documentation

## 2014-07-12 HISTORY — PX: CYSTOSCOPY W/ RETROGRADES: SHX1426

## 2014-07-12 HISTORY — PX: TRANSURETHRAL RESECTION OF PROSTATE: SHX73

## 2014-07-12 LAB — COMPREHENSIVE METABOLIC PANEL
ALK PHOS: 41 U/L (ref 39–117)
ALT: 19 U/L (ref 0–53)
AST: 29 U/L (ref 0–37)
Albumin: 3.7 g/dL (ref 3.5–5.2)
Anion gap: 9 (ref 5–15)
BUN: 22 mg/dL (ref 6–23)
CALCIUM: 9.4 mg/dL (ref 8.4–10.5)
CO2: 29 meq/L (ref 19–32)
Chloride: 103 mEq/L (ref 96–112)
Creatinine, Ser: 0.9 mg/dL (ref 0.50–1.35)
GFR calc Af Amer: 86 mL/min — ABNORMAL LOW (ref >90)
GFR calc non Af Amer: 74 mL/min — ABNORMAL LOW (ref >90)
Glucose, Bld: 112 mg/dL — ABNORMAL HIGH (ref 70–99)
POTASSIUM: 4.8 meq/L (ref 3.7–5.3)
SODIUM: 141 meq/L (ref 137–147)
TOTAL PROTEIN: 6.8 g/dL (ref 6.0–8.3)
Total Bilirubin: 1.3 mg/dL — ABNORMAL HIGH (ref 0.3–1.2)

## 2014-07-12 LAB — MAGNESIUM: Magnesium: 2.2 mg/dL (ref 1.5–2.5)

## 2014-07-12 SURGERY — CYSTOSCOPY, WITH RETROGRADE PYELOGRAM
Anesthesia: General

## 2014-07-12 MED ORDER — PROPOFOL 10 MG/ML IV BOLUS
INTRAVENOUS | Status: AC
  Filled 2014-07-12: qty 20

## 2014-07-12 MED ORDER — DOCUSATE SODIUM 100 MG PO CAPS
100.0000 mg | ORAL_CAPSULE | Freq: Two times a day (BID) | ORAL | Status: DC
  Administered 2014-07-12: 100 mg via ORAL
  Filled 2014-07-12 (×3): qty 1

## 2014-07-12 MED ORDER — OXYCODONE-ACETAMINOPHEN 5-325 MG PO TABS: 1.0000 | ORAL_TABLET | Freq: Four times a day (QID) | ORAL | Status: DC | PRN

## 2014-07-12 MED ORDER — BELLADONNA ALKALOIDS-OPIUM 16.2-60 MG RE SUPP
RECTAL | Status: AC
  Filled 2014-07-12: qty 1

## 2014-07-12 MED ORDER — LIDOCAINE HCL 2 % EX GEL
CUTANEOUS | Status: AC
  Filled 2014-07-12: qty 10

## 2014-07-12 MED ORDER — ASPIRIN 81 MG PO CHEW
81.0000 mg | CHEWABLE_TABLET | Freq: Every day | ORAL | Status: DC
  Administered 2014-07-12: 81 mg via ORAL
  Filled 2014-07-12 (×2): qty 1

## 2014-07-12 MED ORDER — PHENYLEPHRINE 40 MCG/ML (10ML) SYRINGE FOR IV PUSH (FOR BLOOD PRESSURE SUPPORT)
PREFILLED_SYRINGE | INTRAVENOUS | Status: AC
  Filled 2014-07-12: qty 10

## 2014-07-12 MED ORDER — HYOSCYAMINE SULFATE 0.125 MG SL SUBL
0.1250 mg | SUBLINGUAL_TABLET | SUBLINGUAL | Status: DC | PRN
  Filled 2014-07-12: qty 1

## 2014-07-12 MED ORDER — ONDANSETRON HCL 4 MG/2ML IJ SOLN
INTRAMUSCULAR | Status: DC | PRN
  Administered 2014-07-12: 4 mg via INTRAVENOUS

## 2014-07-12 MED ORDER — FENTANYL CITRATE 0.05 MG/ML IJ SOLN
INTRAMUSCULAR | Status: AC
  Filled 2014-07-12: qty 2

## 2014-07-12 MED ORDER — CEPHALEXIN 500 MG PO CAPS
500.0000 mg | ORAL_CAPSULE | Freq: Every day | ORAL | Status: DC
  Administered 2014-07-12: 500 mg via ORAL
  Filled 2014-07-12 (×2): qty 1

## 2014-07-12 MED ORDER — CEPHALEXIN 500 MG PO CAPS: 500.0000 mg | ORAL_CAPSULE | Freq: Every day | ORAL | Status: DC

## 2014-07-12 MED ORDER — OXYCODONE HCL 5 MG/5ML PO SOLN
5.0000 mg | Freq: Once | ORAL | Status: DC | PRN
  Filled 2014-07-12: qty 5

## 2014-07-12 MED ORDER — CISATRACURIUM BESYLATE 20 MG/10ML IV SOLN
INTRAVENOUS | Status: AC
  Filled 2014-07-12: qty 10

## 2014-07-12 MED ORDER — EPHEDRINE SULFATE 50 MG/ML IJ SOLN
INTRAMUSCULAR | Status: DC | PRN
  Administered 2014-07-12: 10 mg via INTRAVENOUS

## 2014-07-12 MED ORDER — HYDROMORPHONE HCL 1 MG/ML IJ SOLN: 0.2500 mg | INTRAMUSCULAR | Status: DC | PRN

## 2014-07-12 MED ORDER — METOPROLOL TARTRATE 12.5 MG HALF TABLET: 12.5000 mg | ORAL_TABLET | Freq: Two times a day (BID) | ORAL | Status: DC

## 2014-07-12 MED ORDER — SODIUM CHLORIDE 0.9 % IR SOLN
Status: DC | PRN
  Administered 2014-07-12: 6000 mL via INTRAVESICAL

## 2014-07-12 MED ORDER — MITOMYCIN CHEMO FOR BLADDER INSTILLATION 40 MG
40.0000 mg | Freq: Once | INTRAVENOUS | Status: AC
Start: 2014-07-12 — End: 2014-07-12
  Administered 2014-07-12: 40 mg via INTRAVESICAL
  Filled 2014-07-12: qty 40

## 2014-07-12 MED ORDER — MEPERIDINE HCL 50 MG/ML IJ SOLN: 6.2500 mg | INTRAMUSCULAR | Status: DC | PRN

## 2014-07-12 MED ORDER — LACTATED RINGERS IV SOLN
INTRAVENOUS | Status: DC
  Administered 2014-07-12: 1000 mL via INTRAVENOUS

## 2014-07-12 MED ORDER — BELLADONNA ALKALOIDS-OPIUM 16.2-60 MG RE SUPP
RECTAL | Status: DC | PRN
  Administered 2014-07-12: 1 via RECTAL

## 2014-07-12 MED ORDER — OXYCODONE HCL 5 MG PO TABS: 5.0000 mg | ORAL_TABLET | Freq: Once | ORAL | Status: DC | PRN

## 2014-07-12 MED ORDER — PROPOFOL 10 MG/ML IV BOLUS
INTRAVENOUS | Status: DC | PRN
  Administered 2014-07-12: 50 mg via INTRAVENOUS
  Administered 2014-07-12: 100 mg via INTRAVENOUS

## 2014-07-12 MED ORDER — PHENAZOPYRIDINE HCL 100 MG PO TABS: 100.0000 mg | ORAL_TABLET | Freq: Three times a day (TID) | ORAL | Status: DC | PRN

## 2014-07-12 MED ORDER — ONDANSETRON HCL 4 MG/2ML IJ SOLN
INTRAMUSCULAR | Status: AC
Start: 2014-07-12 — End: 2014-07-12
  Filled 2014-07-12: qty 2

## 2014-07-12 MED ORDER — FENTANYL CITRATE 0.05 MG/ML IJ SOLN
INTRAMUSCULAR | Status: DC | PRN
  Administered 2014-07-12 (×4): 25 ug via INTRAVENOUS

## 2014-07-12 MED ORDER — CEFAZOLIN SODIUM-DEXTROSE 2-3 GM-% IV SOLR
INTRAVENOUS | Status: AC
  Filled 2014-07-12: qty 50

## 2014-07-12 MED ORDER — PROMETHAZINE HCL 25 MG/ML IJ SOLN: 6.2500 mg | INTRAMUSCULAR | Status: DC | PRN

## 2014-07-12 MED ORDER — CEFAZOLIN SODIUM-DEXTROSE 2-3 GM-% IV SOLR
2.0000 g | INTRAVENOUS | Status: AC
  Administered 2014-07-12: 2 g via INTRAVENOUS

## 2014-07-12 MED ORDER — ATORVASTATIN CALCIUM 80 MG PO TABS
80.0000 mg | ORAL_TABLET | Freq: Every day | ORAL | Status: DC
  Administered 2014-07-12: 80 mg via ORAL
  Filled 2014-07-12 (×2): qty 1

## 2014-07-12 MED ORDER — BACITRACIN-NEOMYCIN-POLYMYXIN 400-5-5000 EX OINT: 1.0000 "application " | TOPICAL_OINTMENT | Freq: Three times a day (TID) | CUTANEOUS | Status: DC | PRN

## 2014-07-12 MED ORDER — CLOPIDOGREL BISULFATE 75 MG PO TABS: 75.0000 mg | ORAL_TABLET | Freq: Every day | ORAL | Status: DC

## 2014-07-12 SURGICAL SUPPLY — 20 items
BAG URINE DRAINAGE (UROLOGICAL SUPPLIES) ×3 IMPLANT
BAG URO CATCHER STRL LF (DRAPE) ×3 IMPLANT
CATH TIEMANN FOLEY 18FR 5CC (CATHETERS) ×3 IMPLANT
DRAPE CAMERA CLOSED 9X96 (DRAPES) ×3 IMPLANT
ELECT BUTTON HF 24-28F 2 30DE (ELECTRODE) IMPLANT
ELECT LOOP MED HF 24F 12D (CUTTING LOOP) ×3 IMPLANT
ELECT LOOP MED HF 24F 12D CBL (CLIP) ×3 IMPLANT
ELECT REM PT RETURN 9FT ADLT (ELECTROSURGICAL)
ELECT RESECT VAPORIZE 12D CBL (ELECTRODE) IMPLANT
ELECTRODE REM PT RTRN 9FT ADLT (ELECTROSURGICAL) IMPLANT
EVACUATOR MICROVAS BLADDER (UROLOGICAL SUPPLIES) IMPLANT
GLOVE BIOGEL M STRL SZ7.5 (GLOVE) ×6 IMPLANT
GOWN STRL REUS W/TWL XL LVL3 (GOWN DISPOSABLE) ×9 IMPLANT
HOLDER FOLEY CATH W/STRAP (MISCELLANEOUS) IMPLANT
MANIFOLD NEPTUNE II (INSTRUMENTS) ×3 IMPLANT
PACK CYSTO (CUSTOM PROCEDURE TRAY) ×3 IMPLANT
SYR 30ML LL (SYRINGE) IMPLANT
SYRINGE IRR TOOMEY STRL 70CC (SYRINGE) ×6 IMPLANT
TUBING CONNECTING 10 (TUBING) ×2 IMPLANT
TUBING CONNECTING 10' (TUBING) ×1

## 2014-07-12 NOTE — Transfer of Care (Signed)
Immediate Anesthesia Transfer of Care Note  Patient: Tanner [name]  Procedure(s) Performed: Procedure(s): CYSTOSCOPY WITH RETROGRADE PYELOGRAM (N/A) TRANSURETHRAL RESECTION OF THE PROSTATE (TURP) WITH MITOMYCIN -C (N/A)  Patient Location: PACU  Anesthesia Type:General  Level of Consciousness: awake, alert , oriented and patient cooperative  Airway & Oxygen Therapy: Patient Spontanous Breathing and Patient connected to face mask oxygen  Post-op Assessment: Report given to PACU RN and Post -op Vital signs reviewed and stable  Post vital signs: Reviewed and stable  Complications: No apparent anesthesia complications

## 2014-07-12 NOTE — Anesthesia Postprocedure Evaluation (Signed)
Anesthesia Post Note  Patient: Tanner [name]  Procedure(s) Performed: Procedure(s) (LRB): CYSTOSCOPY WITH RETROGRADE PYELOGRAM (N/A) TRANSURETHRAL RESECTION OF THE PROSTATE (TURP) WITH MITOMYCIN -C (N/A)  Anesthesia type: General  Patient location: PACU  Post pain: Pain level controlled  Post assessment: Post-op Vital signs reviewed  Last Vitals: BP 178/140 mmHg  Pulse 49  Temp(Src) 36.4 C (Oral)  Resp 11  Ht 5\' 11"  (1.803 m)  Wt 157 lb (71.215 kg)  BMI 21.91 kg/m2  SpO2 100%  Post vital signs: Reviewed  Level of consciousness: sedated  Complications: No apparent anesthesia complications

## 2014-07-12 NOTE — Anesthesia Postprocedure Evaluation (Signed)
Anesthesia Post Note  Patient: Tanner [name]  Procedure(s) Performed: Procedure(s) (LRB): CYSTOSCOPY WITH RETROGRADE PYELOGRAM (N/A) TRANSURETHRAL RESECTION OF THE PROSTATE (TURP) WITH MITOMYCIN -C (N/A)  Anesthesia type: General  Patient location: PACU  Post pain: Pain level controlled  Post assessment: Post-op Vital signs reviewed  Last Vitals: BP 148/64 mmHg  Pulse 40  Temp(Src) 36.3 C (Oral)  Resp 15  Ht 5\' 11"  (1.803 m)  Wt 157 lb (71.215 kg)  BMI 21.91 kg/m2  SpO2 97%  Post vital signs: Reviewed  Level of consciousness: sedated  PACU course: Pt with bradycardia and frequent PVCs, which is new for him in review of the EKGs I have on record. He is asymptomatic, but with a of HR 45 with 1/3 of beats being PVCs I felt he should be observed overnight on telemetry and have cardiology evaluate his rhythm. I spoke with Dr. [name] and he agree and said he would contract cardiology.  Complications: No apparent anesthesia complications

## 2014-07-12 NOTE — Anesthesia Preprocedure Evaluation (Signed)
Anesthesia Evaluation  Patient identified by MRN, date of birth, ID band Patient awake    Reviewed: Allergy & Precautions, H&P , NPO status , Patient's Chart, lab work & pertinent test results, reviewed documented beta blocker date and time   Airway Mallampati: II  TM Distance: >3 FB Neck ROM: Full    Dental no notable dental hx.    Pulmonary neg pulmonary ROS, former smoker,  breath sounds clear to auscultation  Pulmonary exam normal       Cardiovascular Pt. on home beta blockers + CAD and + CABG Rhythm:Regular Rate:Normal     Neuro/Psych TIA Neuromuscular disease negative psych ROS   GI/Hepatic negative GI ROS, Neg liver ROS,   Endo/Other  negative endocrine ROS  Renal/GU Renal disease     Musculoskeletal  (+) Arthritis -,   Abdominal   Peds  Hematology negative hematology ROS (+)   Anesthesia Other Findings   Reproductive/Obstetrics                             Anesthesia Physical Anesthesia Plan  ASA: III  Anesthesia Plan: General   Post-op Pain Management:    Induction: Intravenous  Airway Management Planned: LMA  Additional Equipment: None  Intra-op Plan:   Post-operative Plan: Extubation in OR  Informed Consent: I have reviewed the patients History and Physical, chart, labs and discussed the procedure including the risks, benefits and alternatives for the proposed anesthesia with the patient or authorized representative who has indicated his/her understanding and acceptance.   Dental advisory given  Plan Discussed with: CRNA  Anesthesia Plan Comments:         Anesthesia Quick Evaluation

## 2014-07-12 NOTE — Progress Notes (Signed)
As per Dr. Lissa Hoard, pt with bradycardia and frequent PVCs. HR ~ 45 with 1/3 of beats being PVCs. I will admit for for overnight observation on telemetry. I spoke with Dr. Haroldine Laws who will see patient. I ordered a CMP and Mag.

## 2014-07-12 NOTE — Op Note (Signed)
Preoperative diagnosis: Bladder neoplasm, gross hematuria Postoperative diagnosis: Bladder neoplasm, gross hematuria, bulb urethral stricture, fossa navicularis stricture  Procedure: Exam under anesthesia, Bilateral retrograde pyelogram, TURBT 2-5 cm, Installation of mitomycin C in PACU  Surgeon: [name]  Anesthesia: Gen.  Indications for procedure: Mr. Tanner [name] is an 80 year old male who had gross hematuria. On contrast CT revealed a tumor in the right bladder without signs of metastatic disease or hydronephrosis. He presents today for TURBT and bilateral retrogrades to complete the hematuria evaluation.  Findings: On exam under anesthesia the penis was without mass or lesion. The testicles were descended bilaterally and palpably normal. On digital rectal exam the prostate was smooth without hard area or nodule. On bimanual exam the bladder mass was palpated. a B&O suppository was placed.  On cystoscopy the fossa navicularis required dilation to 20 [place] to allow passage of the resectoscope. The remainder the urethra was normal except in the bulb urethra there was about a 12 French dense stricture. Prostatic urethra was normal. Bladder had trabeculation and several cellules. Papillary tumor in the right posterior bladder with early superficial tumor extending from it laterally and anteriorly. The remainder of the mucosa was normal. There were no stones or foreign bodies in the bladder. The trigone and ureteral orifices were normal and there was clear efflux.  Right retrograde pyelogram -this revealed a single ureter single collecting system unit. There was some narrowing with distal dilation and curvature of the ureter as it passed adjacent to the iliac vessels but there were no filling defects.proximal ureter was normal. Collecting system was normal. No filling defect stricture dilation. There was brisk and complete efflux and drainage of the system.  Left retrograde pyelogram this outlined a  single ureter single collecting system unit. The ureter renal pelvis and collecting system were normal without filling defect, stricture or dilation. There was brisk and complete efflux and drainage of the system.  Description of procedure: After consent was obtained patient brought to the operating room. After adequate anesthesia his placed in lobotomy position and prepped and draped in the usual sterile fashion. A timeout was performed to confirm the patient and procedure. The cystoscope was passed per urethra and the cystoscope was small and rigid enough to dilate the fossa navicularis as well as the proximal bulb stricture. The bladder was examined carefully with the 30 and 70 lens. A 5 French open-ended catheter was used to cannulate the right ureteral orifice and retrograde injection of contrast was performed. A similar procedure was carried on the left side. An exam under anesthesia was performed after the scope was removed. A B&O suppository was placed.  After removing the lead and a glove and gown change I tried to pass the resectoscope but it would not pass through the fossa navicularis. Therefore this was dilated to 71 [place]. Resectoscope was passed with the visual obturator and the bulb stricture had to be further dilated with this sheath. Once in the bladder the tumor was resected down to its base with the loop. The patient began to get an obturator reflex. He was then paralyzed and the base was resected. Because of the superior and posterior position it was a challenging location for resection. The bladder tumor was sent as bladder tumor chips in the base was sent as bladder tumor base. The area was fulgurated. There was excellent hemostasis under low pressure. All chips were evacuated and the bladder refill. The scope was removed and an 42 French Foley catheter placed without difficulty. Foley was left  t 2o gravity drainage, draining clear urine. The patient was awakened taken to recovery room in  stable condition.  In the PACU, the bladder was filled with 40 mg of mitomycin in 40 mL of normal saline. It was left indwelling for 50 minutes, the bladder drained and reconnected to gravity drainage.  Complications: None  Blood loss: Minimal  Specimens to pathology: #1 bladder tumor #2 bladder tumor base  Drains: 18 French Foley  Disposition: Patient stable to PACU

## 2014-07-12 NOTE — Consult Note (Signed)
Patient ID: Tanner [name] MRN: [id number] DOB/AGE: Apr 16, 1924 80 y.o.  Admit date: 07/12/2014 Referring Physician: [name] Primary Cardiologist: Ellyn Hack Reason for Consultation: Bradycardia, PVCs  HPI: 80 yo male with history of ischemic cardiomyopathy, CAD s/p CABG, HLD, PAF, TIA with recent hematuria and bladder tumor admitted following surgical procedure today (Bilateral retrograde pyelogram, TURBT 2-5 cm, installation of mitomycin C in PACU). He was noted to have bradycardia with frequent PVCs post-op and was admitted for observation. He had general anesthesia.  I have reviewed the EKG which shows sinus brady with occasional PVCs. He tells me that his baseline HR is 55-60. He has no complaints. No chest pain, SOB, dizziness, near syncope, syncope, diaphoresis, fever, chills.   Tele: sinus brady, rate 45 bpm.    Past Medical History  Diagnosis Date  . Osteoporosis   . Hypercholesteremia   . Tremors of nervous system     hands  . CAD (coronary artery disease), native coronary artery 1991; 2012    PCI - LAD; Cath in 2012 revealed severe RCA & LAD Diseaes -- CABG x 2  . AF (atrial fibrillation) -- Post Operative   . Neuropathy of lower extremity   . Nephrolithiasis   . Dilated idiopathic cardiomyopathy April 2015     in setting of TIA: EF 35-40% (reduced from 55-65 percent) mild AI, moderate and dilated aorta.  [name] Kitchen TIA (transient ischemic attack)   . Arthritis     right shoulder     Family History  Problem Relation Age of Onset  . Heart Problems Mother   . Parkinson's disease Father   . Heart Problems Father     History   Social History  . Marital Status: Married    Spouse Name: Stanton Kidney    Number of Children: 0  . Years of Education: BS, MS   Occupational History  . Retired    Social History Main Topics  . Smoking status: Former Smoker -- 1.00 packs/day for 5 years    Types: Cigarettes    Quit date: 02/08/1938  . Smokeless tobacco: Never Used  . Alcohol Use:  No  . Drug Use: No  . Sexual Activity: Not on file   Other Topics Concern  . Not on file   Social History Narrative   Pt lives at home with spouse.   Caffeine Use: very little    Past Surgical History  Procedure Laterality Date  . Coronary artery bypass graft  10/2010    LIMA-LAD, SVG-rPDA  . Doppler echocardiography  10/18/2004    EF 55-65%,BORDERLINE -enlarged aortic root,mild aortic insuff.,trace tricus.insuff.  . Nm myocar perf wall motion  07/24/1998    EF 71%  . Cardiac catheterization  10/17/2010    80%prox w/mild in-stent restenosis w/75% mid-LADstenosis aft the stent,multi severe stenoses RCAw/heavily calcified vessel,norm LV  . Tonsillectomy    . Appendectomy    . Transthoracic echocardiogram  April 2015    EF 35-40%, mild AI, moderately dilated ascending aorta  . Nm myoview ltd  12/16/2013    INTERMEDIATE RISK test with small sized reversible perfusion defect in the apical anterolateral wall -- most likely consistent with known occlusion of D1 with collaterals.    No Known Allergies  Prior to Admission medications   Medication Sig Start Date End Date Taking? Authorizing Provider  atorvastatin (LIPITOR) 80 MG tablet Take 80 mg by mouth daily.   Yes Historical Provider, MD  calcium-vitamin D (OSCAL WITH D) 500-200 MG-UNIT per tablet Take 1 tablet  by mouth daily.    Yes Historical Provider, MD  fish oil-omega-3 fatty acids 1000 MG capsule Take 1 g by mouth daily.    Yes Historical Provider, MD  metoprolol tartrate (LOPRESSOR) 25 MG tablet Take 12.5 mg by mouth 2 (two) times daily.   Yes Historical Provider, MD  Multiple Vitamin (MULTIVITAMIN) capsule Take 1 capsule by mouth daily.   Yes Historical Provider, MD  cephALEXin (KEFLEX) 500 MG capsule Take 1 capsule (500 mg total) by mouth at bedtime. 07/12/14   [name], MD  clopidogrel (PLAVIX) 75 MG tablet Take 1 tablet (75 mg total) by mouth daily with breakfast. 07/14/14   [name], MD  naproxen sodium  (ANAPROX) 220 MG tablet Take 220 mg by mouth daily.    Historical Provider, MD  phenazopyridine (PYRIDIUM) 100 MG tablet Take 1 tablet (100 mg total) by mouth 3 (three) times daily as needed for pain. 07/12/14   [name], MD    Review of systems complete and found to be negative unless listed above   Physical Exam: Blood pressure 133/76, pulse 56, temperature 98 F (36.7 C), temperature source Oral, resp. rate 16, height 5\' 11"  (1.803 m), weight 157 lb (71.215 kg), SpO2 99 %.    General: Well developed, well nourished, NAD  HEENT: OP clear, mucus membranes moist  SKIN: warm, dry. No rashes.  Neuro: No focal deficits  Musculoskeletal: Muscle strength 5/5 all ext  Psychiatric: Mood and affect normal  Neck: No JVD, no carotid bruits, no thyromegaly, no lymphadenopathy.  Lungs:Clear bilaterally, no wheezes, rhonci, crackles  Cardiovascular: Regular rhythm. Loletha Grayer. No murmurs, gallops or rubs.  Abdomen:Soft. Bowel sounds present. Non-tender.  Extremities: No lower extremity edema. Pulses are 2 + in the bilateral DP/PT.  Labs:   Lab Results  Component Value Date   WBC 3.9* 07/06/2014   HGB 12.5* 07/06/2014   HCT 37.9* 07/06/2014   MCV 95.7 07/06/2014   PLT 120* 07/06/2014     Recent Labs Lab 07/12/14 1546  NA 141  K 4.8  CL 103  CO2 29  BUN 22  CREATININE 0.90  CALCIUM 9.4  PROT 6.8  BILITOT 1.3*  ALKPHOS 41  ALT 19  AST 29  GLUCOSE 112*   Lab Results  Component Value Date   CKTOTAL 169 03/11/2013   CKMBINDEX 5.9* 03/11/2013    Mg 2.2.   Echo 04/05/14: Left ventricle: The cavity size was normal. Wall thickness was increased in a pattern of mild LVH. Systolic function was normal. The estimated ejection fraction was in the range of 55% to 60%. Wall motion was normal; there were no regional wall motion abnormalities. Doppler parameters are consistent with abnormal left ventricular relaxation (grade 1 diastolic dysfunction). - Aortic valve:  Trileaflet; moderately calcified leaflets. There was no stenosis. There was mild to moderate regurgitation. - Aorta: Mildly dilated aortic root. Aortic root dimension: 39 mm (ED). - Mitral valve: Mildly calcified annulus. Mildly calcified leaflets . There was mild regurgitation. - Left atrium: The atrium was mildly dilated. - Right ventricle: The cavity size was normal. Systolic function was normal. - Tricuspid valve: Peak RV-RA gradient (S): 23 mm Hg. - Pulmonary arteries: PA peak pressure: 26 mm Hg (S). - Inferior vena cava: The vessel was normal in size. The respirophasic diameter changes were in the normal range (= 50%), consistent with normal central venous pressure. Impressions: - Normal LV size with mild LV hypertrophy. EF is now normal, 55-60%. Normal RV size and systolic function. Mild MR, mild  to moderate aortic insufficiency.  EKG: Sinus brady, rate 48 bpm. PVCs.   ASSESSMENT/PLAN:  1. CAD s/p CABG: Stable. Recent stress myoview pre-op which showed possible anterolateral ischemia that goes along with his known occluded diagonal branch. He has not had any anginal symptoms. He had a followup echocardiogram August 2015 which showed recovery of his ejection fraction.  2. Ischemic cardiomyopathy: LVEF normal by echo August 2015.   3. Bladder tumor: Per urology team  4. Bradycardia/PVCs: He has sinus bradycardia with general anesthesia. Continue with sinus brady. Asymptomatic. Electrolytes are ok. Will hold beta blocker tonight. Monitor on telemetry tonight.    SignedGennette Pac 07/12/2014, 4:42 PM

## 2014-07-13 ENCOUNTER — Encounter (HOSPITAL_COMMUNITY): Payer: Self-pay | Admitting: Urology

## 2014-07-13 DIAGNOSIS — C679 Malignant neoplasm of bladder, unspecified: Secondary | ICD-10-CM | POA: Diagnosis not present

## 2014-07-13 DIAGNOSIS — I2510 Atherosclerotic heart disease of native coronary artery without angina pectoris: Secondary | ICD-10-CM | POA: Diagnosis present

## 2014-07-13 DIAGNOSIS — I498 Other specified cardiac arrhythmias: Secondary | ICD-10-CM

## 2014-07-13 NOTE — Plan of Care (Signed)
Problem: Phase I Progression Outcomes Goal: OOB as tolerated unless otherwise ordered Outcome: Completed/Met Date Met:  07/13/14     

## 2014-07-13 NOTE — Discharge Summary (Signed)
Physician Discharge Summary  Patient ID: Tanner [name] MRN: [id number] DOB/AGE: 06-Apr-1924 80 y.o.  Admit date: 07/12/2014 Discharge date: 07/13/2014  Admission Diagnoses: Bladder neoplasm  Discharge Diagnoses:  Active Problems:   Bladder neoplasm   Sinus bradycardia   CAD (coronary artery disease) of artery bypass graft   CAD (coronary artery disease), native coronary artery Urethral stricture  Discharged Condition: good  Hospital Course: Patient was admitted following a TURBT and urethral stricture dilation for observation.  He was bradycardic postop with some PVCs but asymptomatic and stable.  His beta blocker was held and his magnesium and electrolytes were monitored and were normal.  Postop day 1 today he is stable without complaint.  Urine is clear. He will be discharged home with Foley for 2 more days and I instructed him how to remove it.  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   Consults: cardiology  Significant Diagnostic Studies: labs: CMET  Treatments: surgery: TURBT with Installation of mitomycin-C in PACU  Discharge Exam: Blood pressure 108/69, pulse 65, temperature 98.9 F (37.2 C), temperature source Oral, resp. rate 18, height 5\' 11"  (1.803 m), weight 71.215 kg (157 lb), SpO2 99 %. NAD , Alert and oriented Cardiovascular-regular rate and rhythm Watching TV Abdomen soft and nontender Foley catheter in place urine cleared  Disposition: 01-Home or Self Care     Medication List    TAKE these medications        atorvastatin 80 MG tablet  Commonly known as:  LIPITOR  Take 80 mg by mouth daily.     calcium-vitamin D 500-200 MG-UNIT per tablet  Commonly known as:  OSCAL WITH D  Take 1 tablet by mouth daily.     cephALEXin 500 MG capsule  Commonly known as:  KEFLEX  Take 1 capsule (500 mg total) by mouth at bedtime.     clopidogrel 75 MG tablet  Commonly known as:  PLAVIX  Take 1 tablet (75 mg total) by mouth daily with breakfast.   Start taking on:  07/14/2014     fish oil-omega-3 fatty acids 1000 MG capsule  Take 1 g by mouth daily.     metoprolol tartrate 25 MG tablet  Commonly known as:  LOPRESSOR  Take 12.5 mg by mouth 2 (two) times daily.     multivitamin capsule  Take 1 capsule by mouth daily.     naproxen sodium 220 MG tablet  Commonly known as:  ANAPROX  Take 220 mg by mouth daily.     phenazopyridine 100 MG tablet  Commonly known as:  PYRIDIUM  Take 1 tablet (100 mg total) by mouth 3 (three) times daily as needed for pain.           Follow-up Information    Follow up with [name], MD In 1 week.   Specialty:  Urology   Contact information:   [address] [address] [phone]       Signed: [name] 07/13/2014, 9:34 AM

## 2014-07-13 NOTE — Progress Notes (Signed)
   SUBJECTIVE:  Feels well  OBJECTIVE:   Vitals:   Filed Vitals:   07/12/14 1600 07/12/14 1625 07/12/14 2120 07/13/14 0612  BP: 178/140 133/76 108/61 108/69  Pulse: 49 56 56 65  Temp:  98 F (36.7 C) 97.2 F (36.2 C) 98.9 F (37.2 C)  TempSrc:   Oral Oral  Resp: 11 16 16 18   Height:  5\' 11"  (1.803 m)    Weight:  157 lb (71.215 kg)    SpO2: 100% 99% 99% 99%   I&O's:   Intake/Output Summary (Last 24 hours) at 07/13/14 0810 Last data filed at 07/13/14 0700  Gross per 24 hour  Intake    920 ml  Output   1550 ml  Net   -630 ml   TELEMETRY: Reviewed telemetry pt in NSR with PVCs:     PHYSICAL EXAM General: Well developed, well nourished, in no acute distress Head:   Normal cephalic and atramatic  Lungs:  *No Wheezing. Heart:  * HRRR S1 S2  No JVD.   Abdomen:  non-distended Msk:    Normal strength and tone for age. Extremities:  No edema.   Neuro: Alert and oriented. Psych:  Normal affect, responds appropriately Skin: No rash   LABS: Basic Metabolic Panel:  Recent Labs  07/12/14 1546  NA 141  K 4.8  CL 103  CO2 29  GLUCOSE 112*  BUN 22  CREATININE 0.90  CALCIUM 9.4  MG 2.2   Liver Function Tests:  Recent Labs  07/12/14 1546  AST 29  ALT 19  ALKPHOS 41  BILITOT 1.3*  PROT 6.8  ALBUMIN 3.7   No results for input(s): LIPASE, AMYLASE in the last 72 hours. CBC: No results for input(s): WBC, NEUTROABS, HGB, HCT, MCV, PLT in the last 72 hours. Cardiac Enzymes: No results for input(s): CKTOTAL, CKMB, CKMBINDEX, TROPONINI in the last 72 hours. BNP: Invalid input(s): POCBNP D-Dimer: No results for input(s): DDIMER in the last 72 hours. Hemoglobin A1C: No results for input(s): HGBA1C in the last 72 hours. Fasting Lipid Panel: No results for input(s): CHOL, HDL, LDLCALC, TRIG, CHOLHDL, LDLDIRECT in the last 72 hours. Thyroid Function Tests: No results for input(s): TSH, T4TOTAL, T3FREE, THYROIDAB in the last 72 hours.  Invalid input(s):  FREET3 Anemia Panel: No results for input(s): VITAMINB12, FOLATE, FERRITIN, TIBC, IRON, RETICCTPCT in the last 72 hours. Coag Panel:   Lab Results  Component Value Date   INR 1.22 10/18/2010   INR 0.95 10/17/2010    RADIOLOGY: No results found.    ASSESSMENT: Kathyrn Lass:  Intermittent bradycardia, PVCs.  No symptoms with this. Watch for any lightheadedness.    Restart Plavix based on bleeding risk post surgery.  CAD: No angina. Medical Rx.  Would hold metoprolol at discharge.    Will sign off. F/u with Dr. Loralie Champagne., MD  07/13/2014  8:10 AM

## 2014-07-13 NOTE — Discharge Instructions (Signed)
Foley Catheter Care A Foley catheter is a soft, flexible tube that is placed into the bladder to drain urine. A Foley catheter may be inserted if:  You leak urine or are not able to control when you urinate (urinary incontinence).  You are not able to urinate when you need to (urinary retention).  You had prostate surgery or surgery on the genitals.  You have certain medical conditions, such as multiple sclerosis, dementia, or a spinal cord injury. If you are going home with a Foley catheter in place, follow the instructions below. TAKING CARE OF THE CATHETER 1. Wash your hands with soap and water. 2. Using mild soap and warm water on a clean washcloth:  Clean the area on your body closest to the catheter insertion site using a circular motion, moving away from the catheter. Never wipe toward the catheter because this could sweep bacteria up into the urethra and cause infection.  Remove all traces of soap. Pat the area dry with a clean towel. For males, reposition the foreskin. 3. Attach the catheter to your leg so there is no tension on the catheter. Use adhesive tape or a leg strap. If you are using adhesive tape, remove any sticky residue left behind by the previous tape you used. 4. Keep the drainage bag below the level of the bladder, but keep it off the floor. 5. Check throughout the day to be sure the catheter is working and urine is draining freely. Make sure the tubing does not become kinked. 6. Do not pull on the catheter. Pulling could damage internal tissues. TAKING CARE OF THE DRAINAGE BAGS You will be given two drainage bags to take home. One is a large overnight drainage bag, and the other is a smaller leg bag that fits underneath clothing. You may wear the overnight bag at any time, but you should never wear the smaller leg bag at night. Follow the instructions below for how to empty, change, and clean your drainage bags. Emptying the Drainage Bag You must empty your drainage  bag when it is  - full or at least 2-3 times a day. 1. Wash your hands with soap and water. 2. Keep the drainage bag below your hips, below the level of your bladder. This stops urine from going back into the tubing and into your bladder. 3. Hold the dirty bag over the toilet or a clean container. 4. Open the pour spout at the bottom of the bag and empty the urine into the toilet or container. Do not let the pour spout touch the toilet, container, or any other surface. Doing so can place bacteria on the bag, which can cause an infection. 5. Clean the pour spout with a gauze pad or cotton ball that has rubbing alcohol on it. 6. Close the pour spout. 7. Attach the bag to your leg with adhesive tape or a leg strap. 8. Wash your hands well. Changing the Drainage Bag Change your drainage bag once a month or sooner if it starts to smell bad or look dirty. Below are steps to follow when changing the drainage bag. 1. Wash your hands with soap and water. 2. Pinch off the rubber catheter so that urine does not spill out. 3. Disconnect the catheter tube from the drainage tube at the connection valve. Do not let the tubes touch any surface. 4. Clean the end of the catheter tube with an alcohol wipe. Use a different alcohol wipe to clean the end of the drainage tube.  5. Connect the catheter tube to the drainage tube of the clean drainage bag. 6. Attach the new bag to the leg with adhesive tape or a leg strap. Avoid attaching the new bag too tightly. 7. Wash your hands well. Cleaning the Drainage Bag 1. Wash your hands with soap and water. 2. Wash the bag in warm, soapy water. 3. Rinse the bag thoroughly with warm water. 4. Fill the bag with a solution of white vinegar and water (1 cup vinegar to 1 qt warm water [.2 L vinegar to 1 L warm water]). Close the bag and soak it for 30 minutes in the solution. 5. Rinse the bag with warm water. 6. Hang the bag to dry with the pour spout open and hanging  downward. 7. Store the clean bag (once it is dry) in a clean plastic bag. 8. Wash your hands well. PREVENTING INFECTION  Wash your hands before and after handling your catheter.  Take showers daily and wash the area where the catheter enters your body. Do not take baths. Replace wet leg straps with dry ones, if this applies.  Do not use powders, sprays, or lotions on the genital area. Only use creams, lotions, or ointments as directed by your caregiver.  For females, wipe from front to back after each bowel movement.  Drink enough fluids to keep your urine clear or pale yellow unless you have a fluid restriction.  Do not let the drainage bag or tubing touch or lie on the floor.  Wear cotton underwear to absorb moisture and to keep your skin drier.  REMOVAL OF THE CATHETER: You may remove the catheter Friday morning, Nov 13, by cutting the valve stem, waiting for the balloon to drain and the slowly pulling the catheter as instructed.   SEEK MEDICAL CARE IF:   Your urine is cloudy or smells unusually bad.  Your catheter becomes clogged.  You are not draining urine into the bag or your bladder feels full.  Your catheter starts to leak. SEEK IMMEDIATE MEDICAL CARE IF:   You have pain, swelling, redness, or pus where the catheter enters the body.  You have pain in the abdomen, legs, lower back, or bladder.  You have a fever.  You see blood fill the catheter, or your urine is pink or red.  You have nausea, vomiting, or chills.  Your catheter gets pulled out. MAKE SURE YOU:   Understand these instructions.  Will watch your condition.  Will get help right away if you are not doing well or get worse. Document Released: 08/19/2005 Document Revised: 01/03/2014 Document Reviewed: 08/10/2012 [facility] Patient Information 2015 [place], [place]. This information is not intended to replace advice given to you by your health care provider. Make sure you discuss any questions you have  with your health care provider. Cystoscopy, Care After Refer to this sheet in the next few weeks. These instructions provide you with information on caring for yourself after your procedure. Your caregiver may also give you more specific instructions. Your treatment has been planned according to current medical practices, but problems sometimes occur. Call your caregiver if you have any problems or questions after your procedure. HOME CARE INSTRUCTIONS  Things you can do to ease any discomfort after your procedure include:  Drinking enough water and fluids to keep your urine clear or pale yellow.  Taking a warm bath to relieve any burning feelings. SEEK IMMEDIATE MEDICAL CARE IF:  7. You have an increase in blood in your urine. 8.  You notice blood clots in your urine. 9. You have difficulty passing urine. 10. You have the chills. 11. You have abdominal pain. 12. You have a fever or persistent symptoms for more than 2-3 days. 13. You have a fever and your symptoms suddenly get worse. MAKE SURE YOU:  9. Understand these instructions. 10. Will watch your condition. 11. Will get help right away if you are not doing well or get worse. Document Released: 03/08/2005 Document Revised: 04/21/2013 Document Reviewed: 02/10/2012 [facility] Patient Information 2015 [place], [place]. This information is not intended to replace advice given to you by your health care provider. Make sure you discuss any questions you have with your health care provider.

## 2014-07-13 NOTE — Plan of Care (Signed)
Problem: Phase I Progression Outcomes Goal: Pain controlled with appropriate interventions Outcome: Not Applicable Date Met:  86/38/17 Goal: Tubes/drains patent Outcome: Completed/Met Date Met:  07/13/14 Goal: Vital signs/hemodynamically stable Outcome: Completed/Met Date Met:  07/13/14 Goal: Other Phase I Outcomes/Goals Outcome: Completed/Met Date Met:  07/13/14

## 2014-08-30 ENCOUNTER — Other Ambulatory Visit: Payer: Self-pay | Admitting: Urology

## 2014-08-31 ENCOUNTER — Telehealth: Payer: Self-pay | Admitting: Neurology

## 2014-08-31 NOTE — Telephone Encounter (Signed)
Called and spoke with wife and confirmed appt for 09/26/14, time change.

## 2014-08-31 NOTE — Progress Notes (Deleted)
Called Dr. Lyndal Rainbow office requested release of orders to sign snd held in [place] surgery 09-09-14 pre op 09-06-14 Thanks

## 2014-09-01 NOTE — Progress Notes (Signed)
Called Dr. Lyndal Rainbow office surgery 09-09-14 pre op 09-06-14 please put orders in Epic under sign and held Thanks

## 2014-09-06 ENCOUNTER — Encounter (HOSPITAL_COMMUNITY)
Admission: RE | Admit: 2014-09-06 | Discharge: 2014-09-06 | Disposition: A | Discharge status: Home / Self Care | Payer: Medicare Other | Source: Ambulatory Visit | Attending: Urology | Admitting: Urology

## 2014-09-06 ENCOUNTER — Encounter (HOSPITAL_COMMUNITY): Payer: Self-pay

## 2014-09-06 DIAGNOSIS — C679 Malignant neoplasm of bladder, unspecified: Secondary | ICD-10-CM | POA: Diagnosis present

## 2014-09-06 DIAGNOSIS — Z8739 Personal history of other diseases of the musculoskeletal system and connective tissue: Secondary | ICD-10-CM | POA: Diagnosis not present

## 2014-09-06 DIAGNOSIS — Z8639 Personal history of other endocrine, nutritional and metabolic disease: Secondary | ICD-10-CM | POA: Diagnosis not present

## 2014-09-06 DIAGNOSIS — E780 Pure hypercholesterolemia: Secondary | ICD-10-CM | POA: Diagnosis not present

## 2014-09-06 DIAGNOSIS — Z9889 Other specified postprocedural states: Secondary | ICD-10-CM | POA: Diagnosis not present

## 2014-09-06 DIAGNOSIS — N359 Urethral stricture, unspecified: Secondary | ICD-10-CM | POA: Diagnosis not present

## 2014-09-06 DIAGNOSIS — Z85828 Personal history of other malignant neoplasm of skin: Secondary | ICD-10-CM | POA: Diagnosis not present

## 2014-09-06 DIAGNOSIS — C672 Malignant neoplasm of lateral wall of bladder: Secondary | ICD-10-CM | POA: Diagnosis not present

## 2014-09-06 HISTORY — DX: Other abnormalities of gait and mobility: R26.89

## 2014-09-06 HISTORY — DX: Transient global amnesia: G45.4

## 2014-09-06 HISTORY — DX: Personal history of urinary calculi: Z87.442

## 2014-09-06 LAB — BASIC METABOLIC PANEL
Anion gap: 6 (ref 5–15)
BUN: 25 mg/dL — AB (ref 6–23)
CALCIUM: 9.2 mg/dL (ref 8.4–10.5)
CO2: 28 mmol/L (ref 19–32)
Chloride: 106 mEq/L (ref 96–112)
Creatinine, Ser: 1.04 mg/dL (ref 0.50–1.35)
GFR calc Af Amer: 72 mL/min — ABNORMAL LOW (ref >90)
GFR, EST NON AFRICAN AMERICAN: 62 mL/min — AB (ref >90)
Glucose, Bld: 76 mg/dL (ref 70–99)
Potassium: 4.2 mmol/L (ref 3.5–5.1)
SODIUM: 140 mmol/L (ref 135–145)

## 2014-09-06 LAB — CBC
HCT: 37.2 % — ABNORMAL LOW (ref 39.0–52.0)
Hemoglobin: 12 g/dL — ABNORMAL LOW (ref 13.0–17.0)
MCH: 31.1 pg (ref 26.0–34.0)
MCHC: 32.3 g/dL (ref 30.0–36.0)
MCV: 96.4 fL (ref 78.0–100.0)
Platelets: 150 10*3/uL (ref 150–400)
RBC: 3.86 MIL/uL — ABNORMAL LOW (ref 4.22–5.81)
RDW: 12.3 % (ref 11.5–15.5)
WBC: 4.2 10*3/uL (ref 4.0–10.5)

## 2014-09-06 LAB — SURGICAL PCR SCREEN
MRSA, PCR: NEGATIVE
Staphylococcus aureus: NEGATIVE

## 2014-09-06 NOTE — Progress Notes (Signed)
Please release orders. Surgery 09/09/14. Thank you!

## 2014-09-06 NOTE — Progress Notes (Signed)
CT angio 12/11/13 on EPIC, EKG 07/12/14 on EPIC

## 2014-09-06 NOTE — Patient Instructions (Signed)
BROK STOCKING  09/06/2014   Your procedure is scheduled on: Friday 09/09/14  Report to [facility]  Entrance and follow signs to               Dent at 05:30 AM.  Call this number if you have problems the morning of surgery [phone]   Remember:  Do not eat food or drink liquids :After Midnight.     Take these medicines the morning of surgery with A SIP OF WATER: metoprolol                               You may not have any metal on your body including hair pins and              piercings  Do not wear jewelry, make-up, lotions, powders or perfumes.             Do not wear nail polish.  Do not shave  48 hours prior to surgery.              Men may shave face and neck.  Do not bring valuables to the hospital. Rew.  Contacts, dentures or bridgework may not be worn into surgery.       Patients discharged the day of surgery will not be allowed to drive home.  Name and phone number of your driver:  Stanton Kidney [phone] _____________________________________________________________________             [facility] - Preparing for Surgery Before surgery, you can play an important role.  Because skin is not sterile, your skin needs to be as free of germs as possible.  You can reduce the number of germs on your skin by washing with CHG (chlorahexidine gluconate) soap before surgery.  CHG is an antiseptic cleaner which kills germs and bonds with the skin to continue killing germs even after washing. Please DO NOT use if you have an allergy to CHG or antibacterial soaps.  If your skin becomes reddened/irritated stop using the CHG and inform your nurse when you arrive at Short Stay. Do not shave (including legs and underarms) for at least 48 hours prior to the first CHG shower.  You may shave your face/neck. Please follow these instructions carefully:  1.  Shower with CHG Soap the night before surgery and the   morning of Surgery.  2.  If you choose to wash your hair, wash your hair first as usual with your  normal  shampoo.  3.  After you shampoo, rinse your hair and body thoroughly to remove the  shampoo.                            4.  Use CHG as you would any other liquid soap.  You can apply chg directly  to the skin and wash                       Gently with a scrungie or clean washcloth.  5.  Apply the CHG Soap to your body ONLY FROM THE NECK DOWN.   Do not use on face/ open  Wound or open sores. Avoid contact with eyes, ears mouth and genitals (private parts).                       Wash face,  Genitals (private parts) with your normal soap.             6.  Wash thoroughly, paying special attention to the area where your surgery  will be performed.  7.  Thoroughly rinse your body with warm water from the neck down.  8.  DO NOT shower/wash with your normal soap after using and rinsing off  the CHG Soap.                9.  Pat yourself dry with a clean towel.            10.  Wear clean pajamas.            11.  Place clean sheets on your bed the night of your first shower and do not  sleep with pets. Day of Surgery : Do not apply any lotions/deodorants the morning of surgery.  Please wear clean clothes to the hospital/surgery center.  FAILURE TO FOLLOW THESE INSTRUCTIONS MAY RESULT IN THE CANCELLATION OF YOUR SURGERY PATIENT SIGNATURE_________________________________  NURSE SIGNATURE__________________________________  ________________________________________________________________________

## 2014-09-08 NOTE — H&P (Signed)
History of Present Illness         F/u - PCP Dr. Noah Delaine.     1- bladder cancer - November 2015 high-grade TA, focal T1 right posterio-lateral bladder. No muscle. Presented with MH. BUN was 17, creatinine 0.95.   Last upper tract: Nov 2015 bilateral RGP negative; Oct 2015 CT A/P non-con - negative apart from bladder mass.   Last cystoscopy: Nov 2015 at diagnosis    2-PCa screening - He typically voids with a good stream. He has no frequency or urgency. He has nocturia 2. He has no history of BPH.  -Sep 2014 PSA 2.1  -Nov 2015 eua/DRE - normal       Dec 2015 interval hx  The patient returns and is doing well. He said no further gross hematuria. He is having somewhat of a weak stream and did have a wide caliber urethral stricture. He has no dysuria or gross hematuria as above. I did send urine for culture as a precaution. Other times he has a good stream. Nocturia 4 on one occasion but recently nocturia 1.   Past Medical History Problems  1. History of Basal cell tumor (D48.5) 2. History of arthritis (Z87.39) 3. 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 History of hypercholesterolemia (Z86.39)  Surgical History Problems  1. History of Bladder Injection Of Cancer Treatment 2. History of Cystoscopy With Fulguration Medium Lesion (2-5cm) 3. History of Kidney Surgery  Current Meds 1. Aleve TABS;  Therapy: (Recorded:09Oct2015) to Recorded 2. Atorvastatin Calcium TABS;  Therapy: (Recorded:09Oct2015) to Recorded 3. Fish Oil CAPS;  Therapy: (Recorded:09Oct2015) to Recorded 4. Multi-Vitamin TABS;  Therapy: (Recorded:09Oct2015) to Recorded 5. Plavix 75 MG Oral Tablet;  Therapy: (Recorded:18Nov2015) to Recorded  Allergies Medication  1. No Known Drug Allergies  Family History Problems  1. Family history of Death : Mother, Father   mother died at age 19 from natural deathfather died at age 66 from natural death 2. No significant family  history : Mother  Social History Problems  1. Denied: History of Alcohol use 2. Denied: History of Caffeine use 3. Married 4. Never a smoker 5. Retired  Engineer, site Vital Signs [Data Includes: Last 1 Day]  Recorded: 21Dec2015 01:34PM  Blood Pressure: 134 / 69 Temperature: 97.5 F Heart Rate: 56  Physical Exam Constitutional: Well nourished and well developed . No acute distress.  Pulmonary: No respiratory distress and normal respiratory rhythm and effort.  Cardiovascular: Heart rate and rhythm are normal . No peripheral edema.  Neuro/Psych:. Mood and affect are appropriate.    Results/Data Urine [Data Includes: Last 1 Day]   [id number]  COLOR YELLOW   APPEARANCE CLOUDY   SPECIFIC GRAVITY 1.025   pH 6.0   GLUCOSE NEG mg/dL  BILIRUBIN NEG   KETONE NEG mg/dL  BLOOD NEG   PROTEIN NEG mg/dL  UROBILINOGEN 0.2 mg/dL  NITRITE POS   LEUKOCYTE ESTERASE MOD   SQUAMOUS EPITHELIAL/HPF NONE SEEN   WBC TNTC WBC/hpf  RBC 0-2 RBC/hpf  BACTERIA MANY   CRYSTALS NONE SEEN   CASTS NONE SEEN    Assessment Assessed  1. Malignant neoplasm of lateral wall of urinary bladder (C67.2) 2. Pyuria (N39.0)  Plan Health Maintenance  1. UA With REFLEX; [Do Not Release]; Status:Complete;   Done: [id number] 01:24PM Malignant neoplasm of lateral wall of urinary bladder  2. Follow-up Schedule Surgery Office  Follow-up  Status: Hold For - Appointment   Requested for: 21Dec2015 Pyuria  3. URINE CULTURE; Status:Hold For - Specimen/Data Collection,Appointment; Requested  for:21Dec2015;   Discussion/Summary    Bladder cancer-high-grade TA focal T1-we'll proceed with cystoscopy, bladder biopsy, possible TURBT. All Plavix 5 days prior and restart after 48 hours. I did send urine for culture.     Signatures Electronically signed by : [name], M.D.; Aug 22 2014  2:00PM EST  Add: positive urine culture treated with cephalexin.

## 2014-09-09 ENCOUNTER — Ambulatory Visit (HOSPITAL_COMMUNITY): Payer: Medicare Other | Admitting: Certified Registered Nurse Anesthetist

## 2014-09-09 ENCOUNTER — Encounter (HOSPITAL_COMMUNITY)
Admission: RE | Disposition: A | Discharge status: Home / Self Care | Payer: Self-pay | Source: Ambulatory Visit | Attending: Urology

## 2014-09-09 ENCOUNTER — Ambulatory Visit (HOSPITAL_COMMUNITY)
Admission: RE | Admit: 2014-09-09 | Discharge: 2014-09-09 | Disposition: A | Discharge status: Home / Self Care | Payer: Medicare Other | Source: Ambulatory Visit | Attending: Urology | Admitting: Urology

## 2014-09-09 ENCOUNTER — Encounter (HOSPITAL_COMMUNITY): Payer: Self-pay | Admitting: *Deleted

## 2014-09-09 DIAGNOSIS — Z85828 Personal history of other malignant neoplasm of skin: Secondary | ICD-10-CM | POA: Insufficient documentation

## 2014-09-09 DIAGNOSIS — Z8639 Personal history of other endocrine, nutritional and metabolic disease: Secondary | ICD-10-CM | POA: Insufficient documentation

## 2014-09-09 DIAGNOSIS — C679 Malignant neoplasm of bladder, unspecified: Secondary | ICD-10-CM | POA: Insufficient documentation

## 2014-09-09 DIAGNOSIS — N359 Urethral stricture, unspecified: Secondary | ICD-10-CM | POA: Insufficient documentation

## 2014-09-09 DIAGNOSIS — C672 Malignant neoplasm of lateral wall of bladder: Secondary | ICD-10-CM | POA: Diagnosis not present

## 2014-09-09 DIAGNOSIS — E780 Pure hypercholesterolemia: Secondary | ICD-10-CM | POA: Insufficient documentation

## 2014-09-09 DIAGNOSIS — Z9889 Other specified postprocedural states: Secondary | ICD-10-CM | POA: Insufficient documentation

## 2014-09-09 DIAGNOSIS — Z8739 Personal history of other diseases of the musculoskeletal system and connective tissue: Secondary | ICD-10-CM | POA: Insufficient documentation

## 2014-09-09 HISTORY — PX: CYSTOSCOPY WITH BIOPSY: SHX5122

## 2014-09-09 SURGERY — CYSTOSCOPY, WITH BIOPSY
Anesthesia: General

## 2014-09-09 MED ORDER — LIDOCAINE HCL (CARDIAC) 20 MG/ML IV SOLN
INTRAVENOUS | Status: AC
  Filled 2014-09-09: qty 5

## 2014-09-09 MED ORDER — BELLADONNA ALKALOIDS-OPIUM 16.2-60 MG RE SUPP
RECTAL | Status: DC | PRN
  Administered 2014-09-09: 1 via RECTAL

## 2014-09-09 MED ORDER — EPHEDRINE SULFATE 50 MG/ML IJ SOLN
INTRAMUSCULAR | Status: AC
  Filled 2014-09-09: qty 1

## 2014-09-09 MED ORDER — PROPOFOL 10 MG/ML IV BOLUS
INTRAVENOUS | Status: DC | PRN
  Administered 2014-09-09: 50 mg via INTRAVENOUS
  Administered 2014-09-09: 150 mg via INTRAVENOUS

## 2014-09-09 MED ORDER — MEPERIDINE HCL 50 MG/ML IJ SOLN: 6.2500 mg | INTRAMUSCULAR | Status: DC | PRN

## 2014-09-09 MED ORDER — CLOPIDOGREL BISULFATE 75 MG PO TABS: 75.0000 mg | ORAL_TABLET | Freq: Every day | ORAL | Status: DC

## 2014-09-09 MED ORDER — CEFAZOLIN SODIUM-DEXTROSE 2-3 GM-% IV SOLR
INTRAVENOUS | Status: AC
  Filled 2014-09-09: qty 50

## 2014-09-09 MED ORDER — LACTATED RINGERS IV SOLN: INTRAVENOUS | Status: DC

## 2014-09-09 MED ORDER — LACTATED RINGERS IV SOLN
INTRAVENOUS | Status: DC | PRN
  Administered 2014-09-09: 07:00:00 via INTRAVENOUS

## 2014-09-09 MED ORDER — FENTANYL CITRATE 0.05 MG/ML IJ SOLN
INTRAMUSCULAR | Status: DC | PRN
  Administered 2014-09-09 (×2): 12.5 ug via INTRAVENOUS
  Administered 2014-09-09: 25 ug via INTRAVENOUS

## 2014-09-09 MED ORDER — FENTANYL CITRATE 0.05 MG/ML IJ SOLN
INTRAMUSCULAR | Status: AC
  Filled 2014-09-09: qty 2

## 2014-09-09 MED ORDER — STERILE WATER FOR IRRIGATION IR SOLN
Status: DC | PRN
  Administered 2014-09-09: 3000 mL

## 2014-09-09 MED ORDER — HYDROMORPHONE HCL 1 MG/ML IJ SOLN: 0.2500 mg | INTRAMUSCULAR | Status: DC | PRN

## 2014-09-09 MED ORDER — LIDOCAINE HCL (CARDIAC) 20 MG/ML IV SOLN
INTRAVENOUS | Status: DC | PRN
  Administered 2014-09-09: 100 mg via INTRAVENOUS

## 2014-09-09 MED ORDER — SODIUM CHLORIDE 0.9 % IR SOLN
Status: DC | PRN
  Administered 2014-09-09: 3000 mL

## 2014-09-09 MED ORDER — BELLADONNA ALKALOIDS-OPIUM 16.2-60 MG RE SUPP
RECTAL | Status: AC
  Filled 2014-09-09: qty 1

## 2014-09-09 MED ORDER — CEPHALEXIN 500 MG PO CAPS: 500.0000 mg | ORAL_CAPSULE | Freq: Every day | ORAL | Status: DC

## 2014-09-09 MED ORDER — PROMETHAZINE HCL 25 MG/ML IJ SOLN: 6.2500 mg | INTRAMUSCULAR | Status: DC | PRN

## 2014-09-09 MED ORDER — EPHEDRINE SULFATE 50 MG/ML IJ SOLN
INTRAMUSCULAR | Status: DC | PRN
  Administered 2014-09-09 (×8): 5 mg via INTRAVENOUS

## 2014-09-09 MED ORDER — PROPOFOL 10 MG/ML IV BOLUS
INTRAVENOUS | Status: AC
  Filled 2014-09-09: qty 20

## 2014-09-09 MED ORDER — LIDOCAINE HCL 2 % EX GEL
CUTANEOUS | Status: AC
  Filled 2014-09-09: qty 10

## 2014-09-09 MED ORDER — SODIUM CHLORIDE 0.9 % IJ SOLN
INTRAMUSCULAR | Status: AC
  Filled 2014-09-09: qty 10

## 2014-09-09 MED ORDER — LIDOCAINE HCL 2 % EX GEL
CUTANEOUS | Status: DC | PRN
  Administered 2014-09-09: 1 via URETHRAL

## 2014-09-09 MED ORDER — ONDANSETRON HCL 4 MG/2ML IJ SOLN
INTRAMUSCULAR | Status: AC
  Filled 2014-09-09: qty 2

## 2014-09-09 MED ORDER — ONDANSETRON HCL 4 MG/2ML IJ SOLN
INTRAMUSCULAR | Status: DC | PRN
  Administered 2014-09-09: 4 mg via INTRAVENOUS

## 2014-09-09 MED ORDER — CEFAZOLIN SODIUM-DEXTROSE 2-3 GM-% IV SOLR
2.0000 g | INTRAVENOUS | Status: AC
  Administered 2014-09-09: 2 g via INTRAVENOUS

## 2014-09-09 SURGICAL SUPPLY — 16 items
BAG URINE DRAINAGE (UROLOGICAL SUPPLIES) ×3 IMPLANT
BAG URO CATCHER STRL LF (DRAPE) ×3 IMPLANT
CATH TIEMANN FOLEY 18FR 5CC (CATHETERS) ×3 IMPLANT
DRAPE CAMERA CLOSED 9X96 (DRAPES) ×3 IMPLANT
ELECT BUTTON HF 24-28F 2 30DE (ELECTRODE) IMPLANT
ELECT LOOP MED HF 24F 12D (CUTTING LOOP) IMPLANT
ELECT LOOP MED HF 24F 12D CBL (CLIP) IMPLANT
ELECT RESECT VAPORIZE 12D CBL (ELECTRODE) IMPLANT
GLOVE BIOGEL M STRL SZ7.5 (GLOVE) ×9 IMPLANT
GOWN STRL REUS W/TWL XL LVL3 (GOWN DISPOSABLE) ×6 IMPLANT
GUIDEWIRE STR DUAL SENSOR (WIRE) ×3 IMPLANT
LEGGING LITHOTOMY PAIR STRL (DRAPES) ×3 IMPLANT
MANIFOLD NEPTUNE II (INSTRUMENTS) ×3 IMPLANT
PACK CYSTO (CUSTOM PROCEDURE TRAY) ×3 IMPLANT
TUBING CONNECTING 10 (TUBING) ×2 IMPLANT
TUBING CONNECTING 10' (TUBING) ×1

## 2014-09-09 NOTE — Op Note (Signed)
Preoperative diagnosis: Bladder cancer, weak urinary stream Postoperative diagnosis: Bladder cancer, fossa navicularis, bulb urethral stricture  Procedure: Exam under anesthesia, Cystoscopy, Dilation of meatal and urethral stricture, Bladder biopsy and fulguration  Surgeon: [name]  Anesthesia: Jeremias  Type of anesthesia: Gen.  Indication for procedure: Patient is 81 year old male with a high-grade TA bladder cancer with focal T1. He was brought back today for restaging and to ensure complete resection. Patient complained weak stream preoperatively.  Findings: On exam under anesthesia the penis was uncircumcised but the foreskin was normal. The penis was unremarkable. The testicles were descended bilaterally and palpably normal. On digital rectal exam the prostate was not enlarged. It was smooth without hard area or nodule.  On cystoscopy there was a stricture of the fossa navicularis and about a 10 French stricture of the bulbar urethra. The prostate was not obstructing. Bladder contained no foreign body or stone. Trigone ureteral orifices were in their normal orthotopic position with clear efflux. Prior resection site was well-healed with a small amount of granulation tissue remaining. There was no residual visible tumor. Remainder the bladder mucosa was unremarkable.  Description of procedure: After consent was obtained patient brought to the operating room. After adequate anesthesia his placed in lithotomy position. A timeout was performed to confirm the patient and procedure. I did an exam under anesthesia and placed a B&O suppository. He was prepped and draped in the usual sterile fashion. I could not pass the cystoscope her urethra due to a fossa navicularis stricture. This was dilated sequentially to 70 [place] without difficulty. The cystoscope was then passed and I encountered a stricture again in the bulb urethra which estimated to be about 10 [place]. It was dilated with sounds to  22 [place] without difficulty. Repeat cystoscopy was performed with adequate dilation. The bladder was carefully inspected. The prior resection site had healed well and was too flat and posterior lateral to resect again. Therefore I took the rigid biopsy forceps and took several biopsies of the area and sent these as bladder biopsy. This revealed the base of the prior resection in the muscle which all appeared normal. Several biopsies were obtained. It was difficult to get a good biopsy of the muscle but I estimate his risk of T2 disease is low. The deeper biopsies were sent as bladder base. Area was fulgurated with excellent hemostasis at low-pressure. The scope was removed. Lidocaine jelly infiltrated per urethra and an 59 French coud catheter was passed. Blue and was inflated it was seated at the bladder neck. Urine draining was clear. Patient was awakened taken to recovery room in stable condition.   Complications: None   Blood loss: Minimal   Specimens To pathology : #1 bladder biopsy #2 bladder base  Drains: 18 French coud catheter  Disposition: Patient stable to PACU

## 2014-09-09 NOTE — Interval H&P Note (Signed)
History and Physical Interval Note:  09/09/2014 7:27 AM  Tanner [name]  has presented today for surgery, with the diagnosis of BLADDER CANCER  The various methods of treatment have been discussed with the patient and family. After consideration of risks, benefits and other options for treatment, the patient has consented to  Procedure(s) with comments: CYSTOSCOPY WITH BIOPSY (N/A) - BLADDER BIOPSY TRANSURETHRAL RESECTION OF BLADDER TUMOR (TURBT) (N/A) - POSSIBLE TURBT as a surgical intervention .  The patient's history has been reviewed, patient examined, no change in status, stable for surgery.  I have reviewed the patient's chart and labs.  Questions were answered to the patient's satisfaction.  Discussed possible bladder perf and foley catheter among others. He has been well - no dysuria or fever, but he's had a slow stream.    Tanner [name]

## 2014-09-09 NOTE — Anesthesia Postprocedure Evaluation (Signed)
Anesthesia Post Note  Patient: Tanner [name]  Procedure(s) Performed: Procedure(s) (LRB): CYSTO WITH BIOPSY AND FULGERATION, DILATION OF URETHRAL STRICTURE (N/A)  Anesthesia type: General  Patient location: PACU  Post pain: Pain level controlled  Post assessment: Post-op Vital signs reviewed  Last Vitals: BP 147/74 mmHg  Pulse 69  Temp(Src) 36.6 C (Oral)  Resp 16  Ht 5\' 10"  (1.778 m)  Wt 157 lb (71.215 kg)  BMI 22.53 kg/m2  SpO2 98%  Post vital signs: Reviewed  Level of consciousness: sedated  Complications: No apparent anesthesia complications

## 2014-09-09 NOTE — Anesthesia Preprocedure Evaluation (Deleted)
Anesthesia Evaluation    Airway        Dental   Pulmonary former smoker,          Cardiovascular     Neuro/Psych    GI/Hepatic   Endo/Other    Renal/GU      Musculoskeletal   Abdominal   Peds  Hematology   Anesthesia Other Findings   Reproductive/Obstetrics                             Anesthesia Physical Anesthesia Plan Anesthesia Quick Evaluation  

## 2014-09-09 NOTE — Transfer of Care (Signed)
Immediate Anesthesia Transfer of Care Note  Patient: Tanner [name]  Procedure(s) Performed: Procedure(s) (LRB): CYSTO WITH BIOPSY AND FULGERATION, DILATION OF URETHRAL STRICTURE (N/A)  Patient Location: PACU  Anesthesia Type: General  Level of Consciousness: sedated, patient cooperative and responds to stimulation  Airway & Oxygen Therapy: Patient Spontanous Breathing and Patient connected to face mask oxgen  Post-op Assessment: Report given to PACU RN and Post -op Vital signs reviewed and stable  Post vital signs: Reviewed and stable  Complications: No apparent anesthesia complications

## 2014-09-09 NOTE — Anesthesia Preprocedure Evaluation (Addendum)
Anesthesia Evaluation  Patient identified by MRN, date of birth, ID band Patient awake    Reviewed: Allergy & Precautions, H&P , NPO status , Patient's Chart, lab work & pertinent test results, reviewed documented beta blocker date and time   Airway Mallampati: II  TM Distance: >3 FB Neck ROM: Full    Dental no notable dental hx.    Pulmonary former smoker,  breath sounds clear to auscultation  Pulmonary exam normal       Cardiovascular + CAD, + CABG and +CHF + dysrhythmias Atrial Fibrillation Rhythm:Regular Rate:Normal  Echo 04/2014  - Left ventricle: The cavity size was normal. Wall thickness wasincreased in a pattern of mild LVH. Systolic function was normal.The estimated ejection fraction was in the range of 55% to 60%.Wall motion was normal; there were no regional wall motionabnormalities. Doppler parameters are consistent with abnormalleft ventricular relaxation (grade 1 diastolic dysfunction). - Aortic valve: Trileaflet; moderately calcified leaflets. Therewas no stenosis. There was mild to moderate regurgitation. - Aorta: Mildly dilated aortic root. Aortic root dimension: 39 mm(ED). - Mitral valve: Mildly calcified annulus. Mildly calcified leaflets. There was mild regurgitation. - Left atrium: The atrium was mildly dilated. - Right ventricle: The cavity size was normal. Systolic functionwas normal. - Tricuspid valve: Peak RV-RA gradient (S): 23 mm Hg. - Pulmonary arteries: PA peak pressure: 26 mm Hg (S). - Inferior vena cava: The vessel was normal in size. The respirophasic diameter changes were in the normal range (= 50%),consistent with normal central venous pressure.  Impressions:  - Normal LV size with mild LV hypertrophy. EF is now normal,55-60%. Normal RV size and systolic function. Mild MR, mild to moderate aortic insufficiency. - Left ventricle: The cavity size was normal. Wall thickness  was increased in a pattern of mild LVH. Systolic function was normal. The estimated ejection fraction was in the range of 55% to 60%. Wall motion was normal; there were no regional wall motion abnormalities. Doppler parameters are consistent with abnormal left ventricular relaxation (grade 1 diastolic dysfunction). - Aortic valve: Trileaflet; moderately calcified leaflets. There was no stenosis. There was mild to moderate regurgitation. - Aorta: Mildly dilated aortic root. Aortic root dimension: 39 mm(ED). - Mitral valve: Mildly calcified annulus. Mildly calcified leaflets. There was mild regurgitation. - Left atrium: The atrium was mildly dilated. - Right ventricle: The cavity size was normal. Systolic functionwas normal. - Tricuspid valve: Peak RV-RA gradient (S): 23 mm Hg. - Pulmonary arteries: PA peak pressure: 26 mm Hg (S). - Inferior vena cava: The vessel was normal in size. The respirophasic diameter changes were in the normal range (= 50%),consistent with normal central venous pressure.  Impressions: - Normal LV size with mild LV hypertrophy. EF is now normal,55-60%. Normal RV size and systolic function. Mild MR, mild to moderate aortic insufficiency.    Neuro/Psych TIA Neuromuscular disease negative psych ROS   GI/Hepatic negative GI ROS, Neg liver ROS,   Endo/Other  negative endocrine ROS  Renal/GU Renal disease     Musculoskeletal  (+) Arthritis -,   Abdominal   Peds  Hematology negative hematology ROS (+)   Anesthesia Other Findings   Reproductive/Obstetrics                            Anesthesia Physical  Anesthesia Plan  ASA: III  Anesthesia Plan: General   Post-op Pain Management:    Induction: Intravenous  Airway Management Planned: LMA  Additional Equipment: None  Intra-op Plan:  Post-operative Plan: Extubation in OR  Informed Consent: I have reviewed the patients History and Physical,  chart, labs and discussed the procedure including the risks, benefits and alternatives for the proposed anesthesia with the patient or authorized representative who has indicated his/her understanding and acceptance.   Dental advisory given  Plan Discussed with: CRNA  Anesthesia Plan Comments:         Anesthesia Quick Evaluation

## 2014-09-09 NOTE — Discharge Instructions (Signed)
Foley Catheter Care A Foley catheter is a soft, flexible tube that is placed into the bladder to drain urine. A Foley catheter may be inserted if:  You leak urine or are not able to control when you urinate (urinary incontinence).  You are not able to urinate when you need to (urinary retention).  You had prostate surgery or surgery on the genitals.  You have certain medical conditions, such as multiple sclerosis, dementia, or a spinal cord injury. If you are going home with a Foley catheter in place, follow the instructions below.  REMOVE THE FOLEY Monday morning, Sep 12, 2014.   TAKING CARE OF THE CATHETER 1. Wash your hands with soap and water. 2. Using mild soap and warm water on a clean washcloth:  Clean the area on your body closest to the catheter insertion site using a circular motion, moving away from the catheter. Never wipe toward the catheter because this could sweep bacteria up into the urethra and cause infection.  Remove all traces of soap. Pat the area dry with a clean towel. For males, reposition the foreskin. 3. Attach the catheter to your leg so there is no tension on the catheter. Use adhesive tape or a leg strap. If you are using adhesive tape, remove any sticky residue left behind by the previous tape you used. 4. Keep the drainage bag below the level of the bladder, but keep it off the floor. 5. Check throughout the day to be sure the catheter is working and urine is draining freely. Make sure the tubing does not become kinked. 6. Do not pull on the catheter or try to remove it. Pulling could damage internal tissues. TAKING CARE OF THE DRAINAGE BAGS You will be given two drainage bags to take home. One is a large overnight drainage bag, and the other is a smaller leg bag that fits underneath clothing. You may wear the overnight bag at any time, but you should never wear the smaller leg bag at night. Follow the instructions below for how to empty, change, and clean  your drainage bags. Emptying the Drainage Bag You must empty your drainage bag when it is  - full or at least 2-3 times a day. 1. Wash your hands with soap and water. 2. Keep the drainage bag below your hips, below the level of your bladder. This stops urine from going back into the tubing and into your bladder. 3. Hold the dirty bag over the toilet or a clean container. 4. Open the pour spout at the bottom of the bag and empty the urine into the toilet or container. Do not let the pour spout touch the toilet, container, or any other surface. Doing so can place bacteria on the bag, which can cause an infection. 5. Clean the pour spout with a gauze pad or cotton ball that has rubbing alcohol on it. 6. Close the pour spout. 7. Attach the bag to your leg with adhesive tape or a leg strap. 8. Wash your hands well. Changing the Drainage Bag Change your drainage bag once a month or sooner if it starts to smell bad or look dirty. Below are steps to follow when changing the drainage bag. 1. Wash your hands with soap and water. 2. Pinch off the rubber catheter so that urine does not spill out. 3. Disconnect the catheter tube from the drainage tube at the connection valve. Do not let the tubes touch any surface. 4. Clean the end of the catheter tube with  an alcohol wipe. Use a different alcohol wipe to clean the end of the drainage tube. 5. Connect the catheter tube to the drainage tube of the clean drainage bag. 6. Attach the new bag to the leg with adhesive tape or a leg strap. Avoid attaching the new bag too tightly. 7. Wash your hands well. Cleaning the Drainage Bag 1. Wash your hands with soap and water. 2. Wash the bag in warm, soapy water. 3. Rinse the bag thoroughly with warm water. 4. Fill the bag with a solution of white vinegar and water (1 cup vinegar to 1 qt warm water [.2 L vinegar to 1 L warm water]). Close the bag and soak it for 30 minutes in the solution. 5. Rinse the bag with warm  water. 6. Hang the bag to dry with the pour spout open and hanging downward. 7. Store the clean bag (once it is dry) in a clean plastic bag. 8. Wash your hands well. PREVENTING INFECTION  Wash your hands before and after handling your catheter.  Take showers daily and wash the area where the catheter enters your body. Do not take baths. Replace wet leg straps with dry ones, if this applies.  Do not use powders, sprays, or lotions on the genital area. Only use creams, lotions, or ointments as directed by your caregiver.  For females, wipe from front to back after each bowel movement.  Drink enough fluids to keep your urine clear or pale yellow unless you have a fluid restriction.  Do not let the drainage bag or tubing touch or lie on the floor.  Wear cotton underwear to absorb moisture and to keep your skin drier. SEEK MEDICAL CARE IF:   Your urine is cloudy or smells unusually bad.  Your catheter becomes clogged.  You are not draining urine into the bag or your bladder feels full.  Your catheter starts to leak. SEEK IMMEDIATE MEDICAL CARE IF:   You have pain, swelling, redness, or pus where the catheter enters the body.  You have pain in the abdomen, legs, lower back, or bladder.  You have a fever.  You see blood fill the catheter, or your urine is pink or red.  You have nausea, vomiting, or chills.  Your catheter gets pulled out. MAKE SURE YOU:   Understand these instructions.  Will watch your condition.  Will get help right away if you are not doing well or get worse. Document Released: 08/19/2005 Document Revised: 01/03/2014 Document Reviewed: 08/10/2012 [facility] Patient Information 2015 [place], [place]. This information is not intended to replace advice given to you by your health care provider. Make sure you discuss any questions you have with your health care provider.

## 2014-09-09 NOTE — Progress Notes (Signed)
Foley connected to leg bag with instructions to reconnect to large bag at night.  Patient states he has instructions to remove catheter at home on Monday.

## 2014-09-09 NOTE — Anesthesia Procedure Notes (Signed)
Procedure Name: LMA Insertion Date/Time: 09/09/2014 7:37 AM Performed by: Maxwell Caul Pre-anesthesia Checklist: Patient identified, Emergency Drugs available, Suction available and Patient being monitored Patient Re-evaluated:Patient Re-evaluated prior to inductionPreoxygenation: Pre-oxygenation with 100% oxygen Intubation Type: IV induction LMA: LMA inserted LMA Size: 5.0 Number of attempts: 1 Tube secured with: Tape Dental Injury: Teeth and Oropharynx as per pre-operative assessment

## 2014-09-12 ENCOUNTER — Encounter (HOSPITAL_COMMUNITY): Payer: Self-pay | Admitting: Urology

## 2014-09-21 ENCOUNTER — Encounter: Payer: Self-pay | Admitting: Cardiology

## 2014-09-21 ENCOUNTER — Ambulatory Visit (INDEPENDENT_AMBULATORY_CARE_PROVIDER_SITE_OTHER): Payer: Medicare Other | Admitting: Cardiology

## 2014-09-21 VITALS — BP 152/78 | HR 58 | Ht 71.0 in | Wt 156.4 lb

## 2014-09-21 DIAGNOSIS — Z955 Presence of coronary angioplasty implant and graft: Secondary | ICD-10-CM

## 2014-09-21 DIAGNOSIS — R001 Bradycardia, unspecified: Secondary | ICD-10-CM

## 2014-09-21 DIAGNOSIS — I2510 Atherosclerotic heart disease of native coronary artery without angina pectoris: Secondary | ICD-10-CM

## 2014-09-21 DIAGNOSIS — I351 Nonrheumatic aortic (valve) insufficiency: Secondary | ICD-10-CM

## 2014-09-21 DIAGNOSIS — E785 Hyperlipidemia, unspecified: Secondary | ICD-10-CM

## 2014-09-21 DIAGNOSIS — G459 Transient cerebral ischemic attack, unspecified: Secondary | ICD-10-CM

## 2014-09-21 DIAGNOSIS — I420 Dilated cardiomyopathy: Secondary | ICD-10-CM

## 2014-09-21 NOTE — Patient Instructions (Signed)
Dr Ellyn Hack recommends that you schedule a follow-up appointment in 6 months. You will receive a reminder letter in the mail two months in advance. If you don't receive a letter, please call our office to schedule the follow-up appointment.

## 2014-09-23 ENCOUNTER — Encounter: Payer: Self-pay | Admitting: Cardiology

## 2014-09-23 NOTE — Assessment & Plan Note (Signed)
Stable. Unable to titrate beta blocker anymore.

## 2014-09-23 NOTE — Assessment & Plan Note (Addendum)
Stable with no active symptoms. He is extremely active doing exercises as well as his appetite for manner the volunteer work. He is not complaining of any anginal symptoms or heart failure symptoms since his last visit. He remains on Plavix which is more for his TIA as well as statin. He is on low-dose beta blocker one half tablet twice a day

## 2014-09-23 NOTE — Assessment & Plan Note (Signed)
Stable by recent echo. Would only followup every couple years

## 2014-09-23 NOTE — Assessment & Plan Note (Signed)
On statin. Last check in April 2015 was excellent. Due for repeat evaluation by PCP within next few months.

## 2014-09-23 NOTE — Assessment & Plan Note (Addendum)
No longer pertinent as this area has been bypassed. It would appear to the diagonal branch was jailed and occluded, possibly related to the stent

## 2014-09-23 NOTE — Assessment & Plan Note (Signed)
Maintain on Plavix. On statin as well.

## 2014-09-23 NOTE — Assessment & Plan Note (Signed)
Essentially resolved based on recent echocardiogram. No heart failure symptoms.

## 2014-09-23 NOTE — Progress Notes (Signed)
PCP: Thressa Sheller, MD  Clinic Note: Chief Complaint  Patient presents with  . 6 month visit    no chest pain , no sob , no swelling    HPI: Tanner [name] is a 81 y.o. male with a history of CAD initially in 1991 he had an LAD stent placed. For recurrent class IV angina in April he had cardiac catheterization revealing diffuse RCA disease as well as LAD disease and was referred for 2 vessel CABG. He had transient postoperative A. Fib at that time but no recurrences after cardioversion. In April of last year (2015) he had a TIA and was noted to have moderate to severe reduction in his EF. This proved to be transient with an improved EF on followup echocardiogram in August. He does mild aortic insufficiency is noted on echocardiogram to evaluation. He Myoview that showed a small apical anterolateral defect it was probably consistent with known occlusion of the diagonal filled by collaterals. This is been treated medically and he is not that symptomatic. At baseline he is extremely active, doing various volunteer work for [employer] for [employer].  He was recently hospitalized T. November 2015 for hematuria and was found to have a small tumor in the bladder that was resected. He does have followup cystoscopy revealing no residual tumor.  He just completed a course of Keflex for presumed possible residual infection. He has noted at least 3 episodes of nocturia at night since his surgery , but no recurrent hematuria.  Interval History: he presents today with no major complaints. He feels great and remains physically active. He exercises at least 30 minutes to 45 minutes a day almost every night and tries to do 6-7 nights a week.  He does stair stepping, stationary bicycle and treadmill in addition to stretching and isometric exercising. He says when he does his cardio exercise he tries to about 90 beats per minute. He denies any resting or exertional chest tightness or pressure. No heart failure  symptoms of PND, orthopnea or edema.he denies any back with irregular heartbeat/palpitations to suggest recurrence of A. Fib. Additionally no recurrence of any TIA or amaurosis fugax symptoms,or syncope/near syncope.  Past Medical History  Diagnosis Date  . CAD S/P percutaneous coronary angioplasty 1991; 10/2010    a) PCI - LAD in 1991; b) CATH 10/2010: Severe 2 Vessel disease = diffuse RCA ( 75% prox, 90% mid & 70 + 75% distal) & LAD (80-90% pre-stent, 75% post-stent) with occluded D1 (filled via OM-D1 collaterals)  -- CABG x 2; c) Myoview: INTERMEDIATE RISK - small sized reversible apical/anterolateral defect -- most likely c/w known occlusion of D1 with collaterals.(med Rx)  . S/P CABG x 04 October 2010    LIMA-LAD, SVG to RPDA (Dr. [name])  . Postoperative atrial fibrillation February 2012    after bypass-"shocked back in and no problems since"  . TIA (transient ischemic attack)   . Transient Dilated idiopathic cardiomyopathy April 2015; 04/2014    a) in setting of TIA: EF 35-40% (reduced from 55-65 percent) mild AI, moderate and dilated aorta.;; b) Recheck Echo 04/2014: EF 55-60%, Gr 1 DD, mild-mod AI.    [name] Kitchen Hyperlipidemia with target LDL less than 70     controlled.  . TGA (transient global amnesia)     "x3 for hours then goes away"  . Osteoporosis   . Tremors of nervous system     light in hands-no problems since  . Arthritis     right shoulder   .  Neuropathy of lower extremity   . Balance problem   . History of kidney stones   . 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  No Known Allergies   Current Outpatient Prescriptions on File Prior to Visit  Medication Sig Dispense Refill  . atorvastatin (LIPITOR) 80 MG tablet Take 80 mg by mouth daily.    . calcium-vitamin D (OSCAL WITH D) 500-200 MG-UNIT per tablet Take 1 tablet by mouth daily.     . clopidogrel (PLAVIX) 75 MG tablet Take 1 tablet  (75 mg total) by mouth daily with breakfast. 30 tablet 10  . fish oil-omega-3 fatty acids 1000 MG capsule Take 1 g by mouth daily.     . metoprolol tartrate (LOPRESSOR) 25 MG tablet Take 12.5 mg by mouth 2 (two) times daily.    . Multiple Vitamin (MULTIVITAMIN) capsule Take 1 capsule by mouth daily.     No current facility-administered medications on file prior to visit.   No Change in Social and Family History  ROS: A comprehensive Review of Systems - was performed Review of Systems  Constitutional: Negative for malaise/fatigue.  HENT: Negative for nosebleeds.   Respiratory: Negative for cough, shortness of breath and wheezing.   Cardiovascular: Negative for claudication.  Gastrointestinal: Negative for blood in stool and melena.  Genitourinary: Positive for frequency (Nocturia). Negative for dysuria and hematuria.  Musculoskeletal: Negative for myalgias.  Neurological: Negative for dizziness and loss of consciousness.  Endo/Heme/Allergies: Does not bruise/bleed easily.  Psychiatric/Behavioral: Negative.   All other systems reviewed and are negative.  Wt Readings from Last 3 Encounters:  09/21/14 156 lb 6.4 oz (70.943 kg)  09/09/14 157 lb (71.215 kg)  07/06/14 157 lb (71.215 kg)   PHYSICAL EXAM BP 152/78 mmHg  Pulse 58  Ht 5\' 11"  (1.803 m)  Wt 156 lb 6.4 oz (70.943 kg)  BMI 21.82 kg/m2 General appearance: alert, cooperative, appears stated age, no distress; pleasant mood and affect very healthy-appearing.  Neck: no adenopathy, no carotid bruit and no JVD Lungs: clear to auscultation bilaterally, normal percussion bilaterally and non-labored Heart: regular rate and rhythm, S1, S2 normal, no murmur, click, rub or gallop; nondisplaced PMI Abdomen: soft, non-tender; bowel sounds normal; no masses,  no organomegaly;  no bruits Extremities: extremities normal, atraumatic, no cyanosis, and trace edema. Pulses: 2+ and symmetric;  Neurologic: Mental status: Alert, oriented, thought  content appropriate Cranial nerves: normal (II-XII grossly intact)   Adult ECG Report  Rate:  55 ;  Rhythm: sinus bradycardia, premature ventricular contractions (PVC) and Nonspecific ST-T wave changes. Otherwise normal EKG Recent Labs  none since April:   ASSESSMENT / PLAN: Two-vessel CAD status post CABG x2;  Stable with no active symptoms. He is extremely active doing exercises as well as his appetite for manner the volunteer work. He is not complaining of any anginal symptoms or heart failure symptoms since his last visit. He remains on Plavix which is more for his TIA as well as statin. He is on low-dose beta blocker one half tablet twice a day   Sinus bradycardia Stable. Unable to titrate beta blocker anymore.   TIA (transient ischemic attack) Maintain on Plavix. On statin as well.     Transient Congestive dilated cardiomyopathy Essentially resolved based on recent echocardiogram. No heart failure symptoms.   Presence of bare metal stent in LAD coronary artery: With significant stenosis on either end of  the stent No longer pertinent as this area has been bypassed. It would appear to the diagonal branch was jailed and occluded, possibly related to the stent   Mild aortic insufficiency Stable by recent echo. Would only followup every couple years   Dyslipidemia, goal LDL below 70 On statin. Last check in April 2015 was excellent. Due for repeat evaluation by PCP within next few months.    Orders Placed This Encounter  Procedures  . EKG 12-Lead   Meds ordered this encounter  Medications  . Naproxen Sodium (ALEVE PO)    Sig: Take 220 mg by mouth every morning.    Followup:  6 months  DAVID W. Ellyn Hack, M.D., M.S. Interventional Cardiologist [facility]

## 2014-09-26 ENCOUNTER — Encounter: Payer: Self-pay | Admitting: Neurology

## 2014-09-26 ENCOUNTER — Ambulatory Visit: Payer: Medicare Other | Admitting: Neurology

## 2014-09-26 ENCOUNTER — Ambulatory Visit (INDEPENDENT_AMBULATORY_CARE_PROVIDER_SITE_OTHER): Payer: Medicare Other | Admitting: Neurology

## 2014-09-26 ENCOUNTER — Telehealth: Payer: Self-pay | Admitting: Neurology

## 2014-09-26 VITALS — BP 158/70 | HR 58 | Temp 97.8°F | Ht 71.0 in | Wt 158.0 lb

## 2014-09-26 DIAGNOSIS — I482 Chronic atrial fibrillation, unspecified: Secondary | ICD-10-CM

## 2014-09-26 DIAGNOSIS — Z9861 Coronary angioplasty status: Secondary | ICD-10-CM

## 2014-09-26 DIAGNOSIS — Z8673 Personal history of transient ischemic attack (TIA), and cerebral infarction without residual deficits: Secondary | ICD-10-CM

## 2014-09-26 DIAGNOSIS — G63 Polyneuropathy in diseases classified elsewhere: Secondary | ICD-10-CM

## 2014-09-26 DIAGNOSIS — R278 Other lack of coordination: Secondary | ICD-10-CM

## 2014-09-26 DIAGNOSIS — Z951 Presence of aortocoronary bypass graft: Secondary | ICD-10-CM

## 2014-09-26 NOTE — Telephone Encounter (Signed)
Called patient to inform that office is closed until 10:00 due to weather, and to call back to reschedule appt.

## 2014-09-26 NOTE — Patient Instructions (Signed)
Drink more water, please use your cane for safety.

## 2014-09-26 NOTE — Telephone Encounter (Signed)
Left VM message concerning office closing since unable to reach

## 2014-09-26 NOTE — Progress Notes (Signed)
Subjective:    Patient ID: Tanner [name] is a 81 y.o. male.  HPI     Interim history:   Tanner [name] is a very pleasant 81 year old right-handed gentleman with an underlying medical history of heart disease, s/p angioplasty in 1991 and then 2 vessel CABG in 2012, hyperlipidemia, a fib, osteoporosis, osteoarthritis and BPH, who presents for followup consultation of his neuropathy and sensory ataxia. He is unaccompanied today. I last saw him on 03/25/15 at which time he felt stable, no falls. He was not using his cane.   Today, he reports doing the same. He has not fallen, does not use his cane. Shoveled snow to come here, stays active, still drives, no recent illness, however on 07/12/14 he had bladder cancer surgery for hematuria, and a recheck earlier this month was fine. His urologist is Dr. [name].   I saw him on 06/21/2013, at which time he had physical examination findings consistent with neuropathy and mild sensory ataxia and I felt his exam was stable. We checked heavy metals at the time which were unremarkable. He was reminded not to climb ladders or be at heights and stay well-hydrated and not to get overheated. In the interim, on 12/09/2013 he presented to the emergency room with new onset poor balance, word finding difficulties, confusion and dizziness. He was working on a rental house fixing a light fixture when he began to feel this way and overall symptoms lasted about 6 hours. Previously he has a history of transient global amnesia. Neurology was consulted and felt that it was more likely a TIA and he therefore he was admitted for TIA workup. He was switched to Plavix. Carotid Doppler studies were done as well as echocardiogram and CT head an MRI and MRA. I reviewed all the hospital test results. Carotid Doppler test results from 12/10/2013 showed: Bilateral: 1-39% ICA stenosis. Echocardiogram from 12/10/2013 showed: He was 35-40, mild aortic regurgitation, moderate aortic dilatation,  mild mitral valve regurgitation, right atrium was mildly to moderately dilated. Cardiology was consulted. CT head from 12/09/2013 without contrast showed: Atrophy with periventricular small vessel disease. No intracranial mass, hemorrhage, or acute appearing infarct. MRI and MRA brain from 12/10/2013 showed: No acute intracranial infarct or other abnormality identified. Small remote right frontal lobe infarct as above. Mild atrophy with moderate chronic microvascular ischemic disease. Normal MRA of the brain without evidence of high-grade flow-limiting stenosis, proximal branch occlusion, or other acute abnormality. No intracranial aneurysm.  He saw my colleague Dr. [name] on 01/20/2014 for TIA followup from the admission. He was advised to continue with Plavix. He was advised regarding secondary stroke prevention and lipid control.  I first met him on 03/11/2013, which time I suggested further workup in the form of blood work and EMG and nerve conduction testing. He reported a one-year history of lower extremity numbness. He has no Hx of EtOH abuse. He helps build houses. He does not use a cane or walker. He denies back pain. He has had no exposures to toxins.  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 He and his wife live at [facility]; they have been married 12 years and they have no children.  He reported no tingling, no burning, no pain, no tenderness, but occasional cramping. His balance is not as good, especially with his eyes closed. He describes no claudication.  His blood  work showed for the most part normal findings with the exception of a borderline elevated CK-MB. I tested ANA, ESR, B12, B1, vitamin D, RPR, SPEP, immunofluorescent electrophoresis him a TSH, CRP, CMP, all of which were normal. 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 EMG evaluation of the right lower extremity shows distal denervation that is acute and chronic consistent with the diagnosis of peripheral neuropathy. There is mild acute denervation in the lumbosacral paraspinal muscles, and a mild overlying lumbosacral radiculopathy of indeterminate level is suspected. Clinical correlation is required.   His Past Medical History Is Significant For: Past Medical History  Diagnosis Date  . CAD S/P percutaneous coronary angioplasty 1991; 10/2010    a) PCI - LAD in 1991; b) CATH 10/2010: Severe 2 Vessel disease = diffuse RCA ( 75% prox, 90% mid & 70 + 75% distal) & LAD (80-90% pre-stent, 75% post-stent) with occluded D1 (filled via OM-D1 collaterals)  -- CABG x 2; c) Myoview: INTERMEDIATE RISK - small sized reversible apical/anterolateral defect -- most likely c/w known occlusion of D1 with collaterals.(med Rx)  . S/P CABG x 04 October 2010    LIMA-LAD, SVG to RPDA (Dr. [name])  . Postoperative atrial fibrillation February 2012    after bypass-"shocked back in and no problems since"  . TIA (transient ischemic attack)   . Transient Dilated idiopathic cardiomyopathy April 2015; 04/2014    a) in setting of TIA: EF 35-40% (reduced from 55-65 percent) mild AI, moderate and dilated aorta.;; b) Recheck Echo 04/2014: EF 55-60%, Gr 1 DD, mild-mod AI.    [name] Kitchen Hyperlipidemia with target LDL less than 70     controlled.  . TGA (transient global amnesia)     "x3 for hours then goes away"  . Osteoporosis   . Tremors of nervous system     light in hands-no problems since  . Arthritis     right shoulder   . Neuropathy of lower extremity   . Balance problem   . History of kidney stones   . Urinary bladder cancer Nov 2015    s/p resection in 07/2014 - Negative Cystoscopy Jan 2016    His Past Surgical  History Is Significant For: Past Surgical History  Procedure Laterality Date  . Coronary artery bypass graft  10/2010    LIMA-LAD, SVG-rPDA  . Doppler echocardiography  10/18/2004    EF 55-65%,BORDERLINE -enlarged aortic root,mild aortic insuff.,trace tricus.insuff.  . Nm myocar perf wall motion  07/24/1998    EF 71%  . Cardiac catheterization  10/17/2010    80%prox w/mild in-stent restenosis w/75% mid-LADstenosis aft the stent,multi severe stenoses RCAw/heavily calcified vessel,norm LV  . Transthoracic echocardiogram  April 2015    EF 35-40%, mild AI, moderately dilated ascending aorta  . Nm myoview ltd  12/16/2013    INTERMEDIATE RISK test with small sized reversible perfusion defect in the apical anterolateral wall -- most likely consistent with known occlusion of D1 with collaterals.  . Cystoscopy w/ retrogrades N/A 07/12/2014    Procedure: CYSTOSCOPY WITH RETROGRADE PYELOGRAM;  Surgeon: [name], MD;  Location: [facility];  Service: Urology;  Laterality: N/A;  . Transurethral resection of prostate N/A 07/12/2014    Procedure: TRANSURETHRAL RESECTION OF THE PROSTATE (TURP) WITH MITOMYCIN -C;  Surgeon: [name], MD;  Location: [facility];  Service: Urology;  Laterality:  N/A;  . Kidney stone surgery  1989  . Tonsillectomy  as teen  . Appendectomy  as teen  . Coccyx removal    . Cataract extraction Bilateral ~10 years ago  . Basal cell carcinoma excision  07/2014    "goes every 6 months"  . Cardioversion  2012    after bypass surgery  . Cystoscopy with biopsy N/A 09/09/2014    Procedure: CYSTO WITH BIOPSY AND FULGERATION, DILATION OF URETHRAL STRICTURE;  Surgeon: [name], MD;  Location: [facility];  Service: Urology;  Laterality: N/A;  BLADDER BIOPSY    His Family History Is Significant For: Family History  Problem Relation Age of Onset  . Heart Problems Mother   . Parkinson's disease Father   . Heart Problems Father     His Social History Is Significant  For: History   Social History  . Marital Status: Married    Spouse Name: Stanton Kidney    Number of Children: 0  . Years of Education: BS, MS   Occupational History  . Retired    Social History Main Topics  . Smoking status: Former Smoker -- 1.00 packs/day for 5 years    Types: Cigarettes    Quit date: 09/02/1957  . Smokeless tobacco: Never Used  . Alcohol Use: No  . Drug Use: No  . Sexual Activity: None   Other Topics Concern  . None   Social History Narrative   Pt lives at home with spouse.   Caffeine Use: very little    His Allergies Are:  No Known Allergies:   His Current Medications Are:  Outpatient Encounter Prescriptions as of 09/26/2014  Medication Sig  . acetaminophen (TYLENOL) 325 MG tablet Take 650 mg by mouth every 6 (six) hours as needed.  [name] Kitchen atorvastatin (LIPITOR) 80 MG tablet Take 80 mg by mouth daily.  . calcium-vitamin D (OSCAL WITH D) 500-200 MG-UNIT per tablet Take 1 tablet by mouth daily.   . clopidogrel (PLAVIX) 75 MG tablet Take 1 tablet (75 mg total) by mouth daily with breakfast.  . fish oil-omega-3 fatty acids 1000 MG capsule Take 1 g by mouth daily.   . metoprolol tartrate (LOPRESSOR) 25 MG tablet Take 12.5 mg by mouth 2 (two) times daily.  . Multiple Vitamin (MULTIVITAMIN) capsule Take 1 capsule by mouth daily.  . Naproxen Sodium (ALEVE PO) Take 220 mg by mouth every morning.  :  Review of Systems:  Out of a complete 14 point review of systems, all are reviewed and negative with the exception of these symptoms as listed below:   Review of Systems  All other systems reviewed and are negative.   Objective:  Neurologic Exam  Physical Exam Physical Examination:   Filed Vitals:   09/26/14 0958  BP: 158/70  Pulse: 58  Temp: 97.8 F (36.6 C)    General Examination: The patient is a very pleasant 81 y.o. male in no acute distress. He appears well-developed and well-nourished and well groomed.   HEENT: Normocephalic, atraumatic, pupils are  equal, round and reactive to light and accommodation. Funduscopic exam is normal with sharp disc margins noted. He is s/p cataract repairs b/l. Extraocular tracking is good without limitation to gaze excursion or nystagmus noted. Normal smooth pursuit is noted. Hearing is grossly intact. Face is symmetric with normal facial animation and normal facial sensation. Speech is clear with no dysarthria noted. There is no hypophonia. There is no lip, neck/head, jaw or voice tremor. Neck is supple with full range of passive  and active motion. There are no carotid bruits on auscultation. Oropharynx exam reveals: mild mouth dryness, adequate dental hygiene and mild airway crowding, due to redundant soft palate. Mallampati is class II. Tongue protrudes centrally and palate elevates symmetrically.   Chest: Clear to auscultation without wheezing, rhonchi or crackles noted.  Heart: S1+S2+0, irregular without murmurs, rubs or gallops noted.   Abdomen: Soft, non-tender and non-distended with normal bowel sounds appreciated on auscultation.  Extremities: There is trace pitting edema around both ankles. He was wearing long knee-high socks and his feet are slightly pale and colder, unchanged, his pedal pulses are faintly palpable.   Skin: Warm and dry without trophic changes noted. There are no varicose veins, except L leg some, but he does have some spider veins. He has some bruising on his forearms.   Musculoskeletal: exam reveals no obvious joint deformities, tenderness or joint swelling or erythema.   Neurologically:  Mental status: The patient is awake, alert and oriented in all 4 spheres. His memory, attention, language and knowledge are appropriate. There is no aphasia, agnosia, apraxia or anomia. Speech is clear with normal prosody and enunciation. Thought process is linear. Mood is congruent and affect is normal. He is very pleasant and conversant. Cranial nerves are as described above under HEENT exam. In  addition, shoulder shrug is normal with equal shoulder height noted. Motor exam: Normal bulk, strength and tone is noted. There is no drift, tremor or rebound. Romberg is positive. Reflexes are 1+ in the upper extremities, 1+ in both knees and absent in both ankles. Fine motor skills are intact with normal finger taps, normal hand movements, normal rapid alternating patting, normal foot taps and normal foot agility.  Cerebellar testing shows no dysmetria or intention tremor on finger to nose testing. Heel to shin is unremarkable bilaterally. There is no truncal ataxia.  Sensory exam is intact to light touch, pinprick, vibration, temperature sense in the upper extremities, and decrease in pinprick sensation, temperature and vibration in the distal lower extremities, up to mid shin area b/l, right a little better. Gait, station and balance: He stands up with no difficulty but his stance is mildly wide-based. He walks cautiously and mildly wide-based. He turns well. Tandem walk is not possible and balance is mildly impaired.   Assessment and Plan:   In summary, KOLLYN LINGAFELTER is a very pleasant 81 year old male with an underlying medical history of heart disease, s/p angioplasty in 1991 and then 2 vessel CABG in 2012, hyperlipidemia, a fib, osteoporosis, osteoarthritis and BPH, who presents for follow up of his neuropathy and mild sensory ataxia noted. He has a fairly stable exam and may have had a TIA in 4/15, but is doing well, on Plavix without complications and no recent TIA-type Sx. His EMG and nerve conduction testing confirmed peripheral neuropathy in the past. We checked heavy metals, which were fine. He has no significant pain. We checked labs in the past, all non-revealing.  I had a long chat with the patient about my findings and the diagnosis of PN, the prognosis and treatment options. We talked about medical treatments and non-pharmacological approaches. We talked about trying to maintain a  healthy lifestyle in general and we again talked about secondary stroke prevention and risk for dehydration. I encouraged the patient to eat healthy, exercise daily and keep well hydrated, to keep a scheduled bedtime and wake time routine, to not skip any meals and eat healthy snacks in between meals and to have protein with  every meal. I also warned him again about getting overheated and dehydrated and would like for him to be extra cautious. I would like for him to use his cane, but he is very reluctant. He is again advised against climbing ladders or be at heights. He is advised to followup with me in about 6 months, sooner if the need arises and is encouraged him to call with any interim questions.

## 2014-09-27 ENCOUNTER — Other Ambulatory Visit: Payer: Self-pay | Admitting: Cardiology

## 2014-09-27 NOTE — Telephone Encounter (Signed)
Rx(s) sent to pharmacy electronically.  

## 2014-12-19 ENCOUNTER — Other Ambulatory Visit: Payer: Self-pay | Admitting: Urology

## 2014-12-19 ENCOUNTER — Other Ambulatory Visit: Payer: Self-pay | Admitting: Cardiology

## 2014-12-23 ENCOUNTER — Other Ambulatory Visit (HOSPITAL_COMMUNITY): Payer: Self-pay | Admitting: *Deleted

## 2014-12-23 ENCOUNTER — Telehealth: Payer: Self-pay | Admitting: *Deleted

## 2014-12-23 NOTE — Patient Instructions (Addendum)
Tanner [name]  12/23/2014   Your procedure is scheduled on: Tuesday May 3rd 2016  Report to [facility] Main  Entrance and follow signs to               [place] at 530 AM.  Call this number if you have problems the morning of surgery [phone]   Remember:  Do not eat food or drink liquids :After Midnight.     Take these medicines the morning of surgery with A SIP OF WATER: Metoprolol Tartrate                              You may not have any metal on your body including hair pins and              piercings  Do not wear jewelry, make-up, lotions, powders or perfumes.             Do not wear nail polish.  Do not shave  48 hours prior to surgery.              Men may shave face and neck.   Do not bring valuables to the hospital. [place].  Contacts, dentures or bridgework may not be worn into surgery.  Leave suitcase in the car. After surgery it may be brought to your room.     Patients discharged the day of surgery will not be allowed to drive home.  Name and phone number of your driver: nephew jack somers cell [phone]  Special Instructions: N/A              Please read over the following fact sheets you were given: _____________________________________________________________________             [facility] - Preparing for Surgery Before surgery, you can play an important role.  Because skin is not sterile, your skin needs to be as free of germs as possible.  You can reduce the number of germs on your skin by washing with CHG (chlorahexidine gluconate) soap before surgery.  CHG is an antiseptic cleaner which kills germs and bonds with the skin to continue killing germs even after washing. Please DO NOT use if you have an allergy to CHG or antibacterial soaps.  If your skin becomes reddened/irritated stop using the CHG and inform your nurse when you arrive at Short Stay. Do not shave  (including legs and underarms) for at least 48 hours prior to the first CHG shower.  You may shave your face/neck. Please follow these instructions carefully:  1.  Shower with CHG Soap the night before surgery and the  morning of Surgery.  2.  If you choose to wash your hair, wash your hair first as usual with your  normal  shampoo.  3.  After you shampoo, rinse your hair and body thoroughly to remove the  shampoo.                           4.  Use CHG as you would any other liquid soap.  You can apply chg directly  to the skin and wash  Gently with a scrungie or clean washcloth.  5.  Apply the CHG Soap to your body ONLY FROM THE NECK DOWN.   Do not use on face/ open                           Wound or open sores. Avoid contact with eyes, ears mouth and genitals (private parts).                       Wash face,  Genitals (private parts) with your normal soap.             6.  Wash thoroughly, paying special attention to the area where your surgery  will be performed.  7.  Thoroughly rinse your body with warm water from the neck down.  8.  DO NOT shower/wash with your normal soap after using and rinsing off  the CHG Soap.                9.  Pat yourself dry with a clean towel.            10.  Wear clean pajamas.            11.  Place clean sheets on your bed the night of your first shower and do not  sleep with pets. Day of Surgery : Do not apply any lotions/deodorants the morning of surgery.  Please wear clean clothes to the hospital/surgery center.  FAILURE TO FOLLOW THESE INSTRUCTIONS MAY RESULT IN THE CANCELLATION OF YOUR SURGERY PATIENT SIGNATURE_________________________________  NURSE SIGNATURE__________________________________  ________________________________________________________________________

## 2014-12-23 NOTE — Progress Notes (Signed)
ekg 09-21-14 epic Nuclear stress 12-16-13 epic Loc dr harding cardiology 09-21-14 epic

## 2014-12-23 NOTE — Telephone Encounter (Signed)
Request for surgical clearance:  1. What type of surgery is being performed? Cysto/dDilation of Urethral Stricture/Bladder Biopsy/Instillation of [place]   2. When is this surgery scheduled? 01/03/2015   3. Are there any medications that need to be held prior to surgery and how long? ASA & Plavix x5 days   4. Name of physician performing surgery? Dr. [name]   5. What is your office phone and fax number? Phone: [phone] Fax: [phone]    Faxed clearance for above procedure - low risk - OK to stop plavix in above stated time frame prior to procedure.

## 2014-12-26 ENCOUNTER — Encounter (HOSPITAL_COMMUNITY): Payer: Self-pay

## 2014-12-26 ENCOUNTER — Encounter (HOSPITAL_COMMUNITY)
Admission: RE | Admit: 2014-12-26 | Discharge: 2014-12-26 | Disposition: A | Discharge status: Home / Self Care | Payer: Medicare Other | Source: Ambulatory Visit | Attending: Urology | Admitting: Urology

## 2014-12-26 DIAGNOSIS — Z01812 Encounter for preprocedural laboratory examination: Secondary | ICD-10-CM | POA: Insufficient documentation

## 2014-12-26 DIAGNOSIS — C679 Malignant neoplasm of bladder, unspecified: Secondary | ICD-10-CM | POA: Insufficient documentation

## 2014-12-26 LAB — BASIC METABOLIC PANEL
Anion gap: 10 (ref 5–15)
BUN: 26 mg/dL — AB (ref 6–23)
CO2: 27 mmol/L (ref 19–32)
Calcium: 9.3 mg/dL (ref 8.4–10.5)
Chloride: 105 mmol/L (ref 96–112)
Creatinine, Ser: 1.1 mg/dL (ref 0.50–1.35)
GFR calc Af Amer: 67 mL/min — ABNORMAL LOW (ref >90)
GFR, EST NON AFRICAN AMERICAN: 57 mL/min — AB (ref >90)
GLUCOSE: 122 mg/dL — AB (ref 70–99)
POTASSIUM: 4.1 mmol/L (ref 3.5–5.1)
Sodium: 142 mmol/L (ref 135–145)

## 2014-12-26 LAB — CBC
HCT: 35.8 % — ABNORMAL LOW (ref 39.0–52.0)
Hemoglobin: 11.9 g/dL — ABNORMAL LOW (ref 13.0–17.0)
MCH: 31.2 pg (ref 26.0–34.0)
MCHC: 33.2 g/dL (ref 30.0–36.0)
MCV: 94 fL (ref 78.0–100.0)
Platelets: 122 10*3/uL — ABNORMAL LOW (ref 150–400)
RBC: 3.81 MIL/uL — ABNORMAL LOW (ref 4.22–5.81)
RDW: 13.2 % (ref 11.5–15.5)
WBC: 3.7 10*3/uL — AB (ref 4.0–10.5)

## 2014-12-26 LAB — SURGICAL PCR SCREEN
MRSA, PCR: NEGATIVE
Staphylococcus aureus: NEGATIVE

## 2014-12-27 NOTE — Progress Notes (Signed)
lov dr harding cardiology 09-21-14 epic Nuclear stress test 12-26-13 epic ekg 09-21-14 epic Cardiac clearance note dr Ellyn Hack on chart for 01-03-15 surgery

## 2014-12-27 NOTE — Progress Notes (Signed)
Cbc and bmet results routed to dr Rodman [place] by epic

## 2015-01-02 MED ORDER — CEFAZOLIN SODIUM-DEXTROSE 2-3 GM-% IV SOLR: 2.0000 g | INTRAVENOUS | Status: DC

## 2015-01-02 NOTE — H&P (Signed)
History of Present Illness         F/u - PCP Dr. Noah Delaine.     1- bladder cancer - November 2015 high-grade TA, focal T1 right posterio-lateral bladder. No muscle. Presented with MH. BUN was 17, creatinine 0.95. Repeat cystoscopy, dilation of urethral stricture, exam under anesthesia, bladder biopsy Jan 2015 - no residual tumor. Biopsy tumor site and base - negative, muscle present.     Last upper tract: Nov 2015 bilateral RGP negative; Oct 2015 CT A/P non-con - negative apart from bladder mass.   Last cystoscopy: Jan 2016 at repeat biopsy    2-PCa screening - He typically voids with a good stream. He has no frequency or urgency. He has nocturia 2. He has no history of BPH.  -Sep 2014 PSA 2.1  -Nov 2015 eua/DRE - normal     3-urethral stricture-patient was noted to have a urethral stricture of the fossa navicularis as well as the bulbourethral prior cystoscopy in the OR.      Apr 2016 interval hx  Patient returns today in continued management of bladder cancer and urethral stricture. As been voiding with a weak stream and feels like he is getting empty. He's had no dysuria or gross hematuria. His UA is clear today and his postvoid is 18 cc.   Past Medical History Problems  1. History of Basal cell tumor (D48.5) 2. History of arthritis (Z87.39) 3. 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 History of hypercholesterolemia (Z86.39)  Surgical History Problems  1. History of Bladder Injection Of Cancer Treatment 2. History of Cystoscopy For Urethral Stricture 3. History of Cystoscopy With Fulguration Medium Lesion (2-5cm) 4. History of Cystoscopy With Fulguration Minor Lesion (Under 23mm) 5. History of Kidney Surgery  Current Meds 1. Aleve TABS;  Therapy: (Recorded:09Oct2015) to Recorded 2. Atorvastatin Calcium 80 MG Oral Tablet; TAKE 1/2 TABLET DAILY;  Therapy: (Recorded:15Apr2016) to Recorded 3. Calcium + D TABS;  Therapy: (Recorded:15Apr2016)  to Recorded 4. Cephalexin 500 MG Oral Capsule; TAKE 1 CAPSULE TWICE DAILY;  Therapy: [id number] to (Evaluate:04Jan2016)  Requested for: 28Dec2015; Last  Rx:28Dec2015 Ordered 5. Fish Oil CAPS;  Therapy: (Recorded:09Oct2015) to Recorded 6. Metoprolol Tartrate TABS;  Therapy: (Recorded:15Apr2016) to Recorded 7. Multi-Vitamin TABS;  Therapy: (Recorded:09Oct2015) to Recorded 8. Plavix 75 MG Oral Tablet;  Therapy: (Recorded:18Nov2015) to Recorded 9. Tylenol 325 MG Oral Tablet;  Therapy: (Recorded:15Apr2016) to Recorded  Allergies Medication  1. No Known Drug Allergies  Family History Problems  1. Family history of Death : Mother, Father   mother died at age 62 from natural deathfather died at age 22 from natural death 2. No significant family history : Mother  Social History Problems  1. Denied: History of Alcohol use 2. Denied: History of Caffeine use 3. Married 4. Never a smoker 5. Retired  Engineer, site Vital Signs [Data Includes: Last 1 Day]  Recorded: 15Apr2016 01:16PM  Blood Pressure: 142 / 77 Heart Rate: 58 Recorded: 15Apr2016 01:15PM  Temperature: 97.4 F  Physical Exam Constitutional: Well nourished and well developed . No acute distress.  Pulmonary: No respiratory distress and normal respiratory rhythm and effort.  Cardiovascular: Heart rate and rhythm are normal . No peripheral edema.  Neuro/Psych:. Mood and affect are appropriate.    Results/Data Urine [Data Includes: Last 1 Day]   15Apr2016  COLOR YELLOW   APPEARANCE CLEAR   SPECIFIC GRAVITY 1.020   pH 5.5   GLUCOSE NEG mg/dL  BILIRUBIN NEG   KETONE NEG mg/dL  BLOOD NEG  PROTEIN NEG mg/dL  UROBILINOGEN 0.2 mg/dL  NITRITE NEG   LEUKOCYTE ESTERASE NEG    PVR: Ultrasound PVR 18 ml.    Procedure  Procedure: Cystoscopy   Indication: Lower Urinary Tract Symptoms. History of Urothelial Carcinoma.  Informed Consent: Risks, benefits, and potential adverse events were discussed and informed consent was  obtained from the patient.  Prep: The patient was prepped with betadine.  Antibiotic prophylaxis: Ciprofloxacin.  Procedure Note:  Urethral meatus:. No abnormalities.  Anterior urethra:. A stricture was present. Stricture of the fossa navicularis, stricture of the bulbar urethra present. The fossa navicularis stricture was dilated over a wire to 18 [place] with [facility] dilators. I was able to pop through the bulbar urethral stricture with the scope as it was narrow but weblike. I could not dilate the fossa navicularis stricture with the scope despite multiple attempts.  Prostatic urethra: No abnormalities.  Bladder: Visulization was clear. The ureteral orifices were in the normal anatomic position bilaterally and had clear efflux of urine. A solitary tumor was visualized in the bladder. A papillary tumor was seen in the bladder. This tumor was located near the dome of the bladder. The patient tolerated the procedure well.  Complications: None.    Assessment Assessed  1. Malignant neoplasm of lateral wall of urinary bladder (C67.2) 2. Urethral stricture (N35.9)  Plan Bladder neoplasm, Health Maintenance, Gross hematuria, Malignant neoplasm of lateral wall of urinary bladder, Pyuria  1. Cysto; Status:Canceled - Date of Service;  Health Maintenance  2. UA With REFLEX; [Do Not Release]; Status:Complete;   Done: [id number] 12:40PM Malignant neoplasm of lateral wall of urinary bladder  3. Follow-up Schedule Surgery Office  Follow-up  Status: Hold For - Appointment   Requested for: 15Apr2016 Urethral stricture  4. Cysto w/Dilation; Status:Complete;   Done: [id number]  Discussion/Summary Urothelial carcinoma-small tumor at the dome that needs biopsy and fulguration. Discussed mitomycin-C also urethral dilation. We discussed urethral dilation as he is seeing has good initial success but urethral strictures tend to recur in progress. Unfortunately bladder cancers tend to recur and progress as well and  he will continue to get need management of both.     Signatures Electronically signed by : [name], M.D.; Dec 16 2014  2:49PM EST

## 2015-01-03 ENCOUNTER — Encounter (HOSPITAL_COMMUNITY): Payer: Self-pay | Admitting: *Deleted

## 2015-01-03 ENCOUNTER — Ambulatory Visit (HOSPITAL_COMMUNITY): Payer: Medicare Other | Admitting: Certified Registered Nurse Anesthetist

## 2015-01-03 ENCOUNTER — Ambulatory Visit (HOSPITAL_COMMUNITY)
Admission: RE | Admit: 2015-01-03 | Discharge: 2015-01-03 | Disposition: A | Discharge status: Home / Self Care | Payer: Medicare Other | Source: Ambulatory Visit | Attending: Urology | Admitting: Urology

## 2015-01-03 ENCOUNTER — Encounter (HOSPITAL_COMMUNITY)
Admission: RE | Disposition: A | Discharge status: Home / Self Care | Payer: Self-pay | Source: Ambulatory Visit | Attending: Urology

## 2015-01-03 DIAGNOSIS — Z8673 Personal history of transient ischemic attack (TIA), and cerebral infarction without residual deficits: Secondary | ICD-10-CM | POA: Diagnosis not present

## 2015-01-03 DIAGNOSIS — I351 Nonrheumatic aortic (valve) insufficiency: Secondary | ICD-10-CM | POA: Insufficient documentation

## 2015-01-03 DIAGNOSIS — I2510 Atherosclerotic heart disease of native coronary artery without angina pectoris: Secondary | ICD-10-CM | POA: Insufficient documentation

## 2015-01-03 DIAGNOSIS — N359 Urethral stricture, unspecified: Secondary | ICD-10-CM | POA: Diagnosis not present

## 2015-01-03 DIAGNOSIS — I4891 Unspecified atrial fibrillation: Secondary | ICD-10-CM | POA: Diagnosis not present

## 2015-01-03 DIAGNOSIS — C671 Malignant neoplasm of dome of bladder: Secondary | ICD-10-CM | POA: Insufficient documentation

## 2015-01-03 DIAGNOSIS — G709 Myoneural disorder, unspecified: Secondary | ICD-10-CM | POA: Diagnosis not present

## 2015-01-03 DIAGNOSIS — E780 Pure hypercholesterolemia: Secondary | ICD-10-CM | POA: Insufficient documentation

## 2015-01-03 DIAGNOSIS — Z792 Long term (current) use of antibiotics: Secondary | ICD-10-CM | POA: Insufficient documentation

## 2015-01-03 DIAGNOSIS — M1990 Unspecified osteoarthritis, unspecified site: Secondary | ICD-10-CM | POA: Diagnosis not present

## 2015-01-03 DIAGNOSIS — Z87891 Personal history of nicotine dependence: Secondary | ICD-10-CM | POA: Diagnosis not present

## 2015-01-03 DIAGNOSIS — Z791 Long term (current) use of non-steroidal anti-inflammatories (NSAID): Secondary | ICD-10-CM | POA: Diagnosis not present

## 2015-01-03 DIAGNOSIS — C679 Malignant neoplasm of bladder, unspecified: Secondary | ICD-10-CM | POA: Diagnosis present

## 2015-01-03 DIAGNOSIS — Z7902 Long term (current) use of antithrombotics/antiplatelets: Secondary | ICD-10-CM | POA: Insufficient documentation

## 2015-01-03 DIAGNOSIS — I509 Heart failure, unspecified: Secondary | ICD-10-CM | POA: Insufficient documentation

## 2015-01-03 DIAGNOSIS — Z79899 Other long term (current) drug therapy: Secondary | ICD-10-CM | POA: Insufficient documentation

## 2015-01-03 DIAGNOSIS — Z85828 Personal history of other malignant neoplasm of skin: Secondary | ICD-10-CM | POA: Diagnosis not present

## 2015-01-03 DIAGNOSIS — Z951 Presence of aortocoronary bypass graft: Secondary | ICD-10-CM | POA: Insufficient documentation

## 2015-01-03 HISTORY — PX: CYSTOSCOPY WITH BIOPSY: SHX5122

## 2015-01-03 HISTORY — PX: CYSTOSCOPY WITH URETHRAL DILATATION: SHX5125

## 2015-01-03 SURGERY — CYSTOSCOPY, WITH URETHRAL DILATION
Anesthesia: General

## 2015-01-03 MED ORDER — PROMETHAZINE HCL 25 MG/ML IJ SOLN: 6.2500 mg | INTRAMUSCULAR | Status: DC | PRN

## 2015-01-03 MED ORDER — LIDOCAINE HCL 2 % EX GEL
CUTANEOUS | Status: AC
  Filled 2015-01-03: qty 10

## 2015-01-03 MED ORDER — FENTANYL CITRATE (PF) 100 MCG/2ML IJ SOLN
INTRAMUSCULAR | Status: DC | PRN
  Administered 2015-01-03: 25 ug via INTRAVENOUS

## 2015-01-03 MED ORDER — MITOMYCIN CHEMO FOR BLADDER INSTILLATION 40 MG
40.0000 mg | Freq: Once | INTRAVENOUS | Status: AC
  Administered 2015-01-03: 40 mg via INTRAVESICAL
  Filled 2015-01-03: qty 40

## 2015-01-03 MED ORDER — DEXAMETHASONE SODIUM PHOSPHATE 10 MG/ML IJ SOLN
INTRAMUSCULAR | Status: AC
  Filled 2015-01-03: qty 1

## 2015-01-03 MED ORDER — ONDANSETRON HCL 4 MG/2ML IJ SOLN
INTRAMUSCULAR | Status: AC
  Filled 2015-01-03: qty 2

## 2015-01-03 MED ORDER — LIDOCAINE HCL (CARDIAC) 20 MG/ML IV SOLN
INTRAVENOUS | Status: AC
  Filled 2015-01-03: qty 5

## 2015-01-03 MED ORDER — IOHEXOL 300 MG/ML  SOLN
INTRAMUSCULAR | Status: DC | PRN
  Administered 2015-01-03: 10 mL

## 2015-01-03 MED ORDER — EPHEDRINE SULFATE 50 MG/ML IJ SOLN
INTRAMUSCULAR | Status: DC | PRN
  Administered 2015-01-03 (×2): 2.5 mg via INTRAVENOUS
  Administered 2015-01-03: 5 mg via INTRAVENOUS
  Administered 2015-01-03: 2.5 mg via INTRAVENOUS

## 2015-01-03 MED ORDER — FENTANYL CITRATE (PF) 100 MCG/2ML IJ SOLN
INTRAMUSCULAR | Status: AC
  Filled 2015-01-03: qty 2

## 2015-01-03 MED ORDER — PROPOFOL 10 MG/ML IV BOLUS
INTRAVENOUS | Status: DC | PRN
  Administered 2015-01-03: 100 mg via INTRAVENOUS

## 2015-01-03 MED ORDER — ONDANSETRON HCL 4 MG/2ML IJ SOLN
INTRAMUSCULAR | Status: DC | PRN
  Administered 2015-01-03: 2 mg via INTRAVENOUS
  Administered 2015-01-03 (×2): 1 mg via INTRAVENOUS

## 2015-01-03 MED ORDER — CEFAZOLIN SODIUM-DEXTROSE 2-3 GM-% IV SOLR
2.0000 g | INTRAVENOUS | Status: AC
  Administered 2015-01-03: 2 g via INTRAVENOUS

## 2015-01-03 MED ORDER — ACETAMINOPHEN 10 MG/ML IV SOLN
1000.0000 mg | Freq: Once | INTRAVENOUS | Status: AC
  Administered 2015-01-03: 1000 mg via INTRAVENOUS
  Filled 2015-01-03: qty 100

## 2015-01-03 MED ORDER — FENTANYL CITRATE (PF) 100 MCG/2ML IJ SOLN: 25.0000 ug | INTRAMUSCULAR | Status: DC | PRN

## 2015-01-03 MED ORDER — PROPOFOL 10 MG/ML IV BOLUS
INTRAVENOUS | Status: AC
  Filled 2015-01-03: qty 20

## 2015-01-03 MED ORDER — LACTATED RINGERS IV SOLN
INTRAVENOUS | Status: DC | PRN
  Administered 2015-01-03 (×2): via INTRAVENOUS

## 2015-01-03 MED ORDER — MIDAZOLAM HCL 2 MG/2ML IJ SOLN
INTRAMUSCULAR | Status: AC
  Filled 2015-01-03: qty 2

## 2015-01-03 MED ORDER — EPHEDRINE SULFATE 50 MG/ML IJ SOLN
INTRAMUSCULAR | Status: AC
  Filled 2015-01-03: qty 1

## 2015-01-03 MED ORDER — MEPERIDINE HCL 50 MG/ML IJ SOLN: 6.2500 mg | INTRAMUSCULAR | Status: DC | PRN

## 2015-01-03 MED ORDER — SODIUM CHLORIDE 0.9 % IJ SOLN
INTRAMUSCULAR | Status: AC
  Filled 2015-01-03: qty 10

## 2015-01-03 MED ORDER — LIDOCAINE HCL 2 % EX GEL
CUTANEOUS | Status: DC | PRN
  Administered 2015-01-03: 1 via URETHRAL

## 2015-01-03 MED ORDER — CEFAZOLIN SODIUM-DEXTROSE 2-3 GM-% IV SOLR
INTRAVENOUS | Status: AC
  Filled 2015-01-03: qty 50

## 2015-01-03 MED ORDER — DEXAMETHASONE SODIUM PHOSPHATE 10 MG/ML IJ SOLN
INTRAMUSCULAR | Status: DC | PRN
  Administered 2015-01-03: 10 mg via INTRAVENOUS

## 2015-01-03 MED ORDER — LIDOCAINE HCL (CARDIAC) 20 MG/ML IV SOLN
INTRAVENOUS | Status: DC | PRN
  Administered 2015-01-03: 50 mg via INTRAVENOUS

## 2015-01-03 MED ORDER — CLOPIDOGREL BISULFATE 75 MG PO TABS: 75.0000 mg | ORAL_TABLET | Freq: Every day | ORAL | Status: DC

## 2015-01-03 MED ORDER — PHENYLEPHRINE HCL 10 MG/ML IJ SOLN
INTRAMUSCULAR | Status: AC
  Filled 2015-01-03: qty 1

## 2015-01-03 SURGICAL SUPPLY — 19 items
BAG URINE DRAINAGE (UROLOGICAL SUPPLIES) ×3 IMPLANT
BAG URO CATCHER STRL LF (DRAPE) ×3 IMPLANT
CATH FOLEY 2WAY SLVR  5CC 14FR (CATHETERS) ×2
CATH FOLEY 2WAY SLVR 5CC 14FR (CATHETERS) ×1 IMPLANT
CATH INTERMIT  6FR 70CM (CATHETERS) ×3 IMPLANT
CLOTH BEACON ORANGE TIMEOUT ST (SAFETY) ×3 IMPLANT
FIBER LASER FLEXIVA 1000 (UROLOGICAL SUPPLIES) IMPLANT
FIBER LASER FLEXIVA 200 (UROLOGICAL SUPPLIES) IMPLANT
FIBER LASER FLEXIVA 365 (UROLOGICAL SUPPLIES) IMPLANT
FIBER LASER FLEXIVA 550 (UROLOGICAL SUPPLIES) IMPLANT
FIBER LASER TRAC TIP (UROLOGICAL SUPPLIES) IMPLANT
GLOVE BIOGEL M STRL SZ7.5 (GLOVE) ×3 IMPLANT
GOWN STRL REUS W/TWL LRG LVL3 (GOWN DISPOSABLE) ×3 IMPLANT
GOWN STRL REUS W/TWL XL LVL3 (GOWN DISPOSABLE) ×3 IMPLANT
MANIFOLD NEPTUNE II (INSTRUMENTS) ×3 IMPLANT
PACK CYSTO (CUSTOM PROCEDURE TRAY) ×3 IMPLANT
TUBING CONNECTING 10 (TUBING) ×2 IMPLANT
TUBING CONNECTING 10' (TUBING) ×1
WIRE COONS/BENSON .038X145CM (WIRE) ×3 IMPLANT

## 2015-01-03 NOTE — Interval H&P Note (Signed)
History and Physical Interval Note:  01/03/2015 7:22 AM  Tanner [name]  has presented today for surgery, with the diagnosis of BLADDER CANCER, URETERAL STRICTURE  The various methods of treatment have been discussed with the patient and family. After consideration of risks, benefits and other options for treatment, the patient has consented to  Procedure(s): CYSTOSCOPY WITH URETHRAL DILATATION (N/A) CYSTOSCOPY WITH BIOPSY (N/A) , post-op mitomycin-c in PACU as a surgical intervention .  The patient's history has been reviewed, patient examined, no change in status, stable for surgery.  I have reviewed the patient's chart and labs.  Questions were answered to the patient's satisfaction.  Discussed retrograde pyelograms as well. He elects to proceed. He has been well.    [name]

## 2015-01-03 NOTE — Op Note (Addendum)
Preoperative diagnosis: Bladder cancer Postoperative diagnosis: Bladder cancer  Procedure: Exam under anesthesia, Cystoscopy with bladder biopsy and fulguration Bilateral retrograde pyelogram Instillation of mitomycin-C bladder in PACU  Surgeon: Dickie Labarre  Type of anesthesia: Gen.  Indication for procedure: Patient has a history of high-grade T1 bladder cancer with a small recurrence at the dome. He was brought for exam biopsy mitomycin-C and retrograde imaging.  Findings: On exam under anesthesia the penis was normal without mass or lesion. The testicles were descended bilaterally and palpably normal. On digital rectal exam the prostate was smooth without hard area or nodule.  On cystoscopy there was a stricture of the fossa navicularis and a stricture of the bulbar urethra. Prostatic urethra was normal. The trigone and ureteral orifices were normal. There were no stones or foreign bodies in the bladder. The bladder was trabeculated. The prior biopsy site on the right was healing with a small area of granulation. There was a superficial papillary tumor at the dome of the bladder which was photographed. It was biopsied 3 and fulgurated. There were no other mucosal lesions.  Left retrograde pyelogram-this outlined a single ureter single collecting system unit without filling defect, stricture or dilation.  Right retrograde pyelogram-this outlined a single ureter single collecting system unit without filling defect stricture dilation.  Description of procedure: After consent was obtained patient brought to the operating room. After adequate anesthesia he is placed in lithotomy position. An exam under anesthesia was performed. He was prepped and draped in the usual sterile fashion. A timeout was performed to confirm the patient and procedure. The fossa navicularis stricture was dilated to 22 [place]. The 21 French rigid cystoscope was passed per urethra and this was used to dilate the bulb  stricture without difficulty. The bladder was carefully inspected with a 12 and 70 lens. The bladder was triple rinsed.  The flexible biopsy forceps were deployed. It was difficult to biopsy this lesion as it was difficult to deflect the scope up enough and reach the area. A biopsied the more superficial areas with the flexible biopsy forceps 2 and then biopsied the larger area with the rigid biopsy forceps. The area was then fulgurated. Hemostasis was excellent. The bladder was triple rinsed.  The left ureteral orifice was cannulated with a 6 [place] open-ended catheter left retrograde injection of contrast was performed. Drainage was brisk. Scout imaging was normal.  The right ureteral orifice was cannulated with a 6 [place] open-ended catheter. Scout imaging was normal. Right retrograde injection of contrast was performed. Drainage was brisk.  Hemostasis again was excellent. The bladder was filled and the scope removed. Lidocaine jelly was passed per urethra. A 16 French Foley was placed and left gravity drainage.  Patient was awakened and taken to recovery room in stable condition.  In the PACU, Instillation of mitomycin-C bladder was performed by me.   Complications: None Blood loss: Minimal  Specimens:-Bladder biopsy to pathology  Drains: 16 French Foley

## 2015-01-03 NOTE — Anesthesia Procedure Notes (Signed)
Procedure Name: LMA Insertion Date/Time: 01/03/2015 7:36 AM Performed by: Ofilia Neas Pre-anesthesia Checklist: Patient identified, Emergency Drugs available, Suction available, Patient being monitored and Timeout performed Patient Re-evaluated:Patient Re-evaluated prior to inductionOxygen Delivery Method: Circle system utilized Preoxygenation: Pre-oxygenation with 100% oxygen Intubation Type: IV induction LMA: LMA inserted LMA Size: 5.0 Number of attempts: 2 Placement Confirmation: positive ETCO2 Tube secured with: Tape Dental Injury: Teeth and Oropharynx as per pre-operative assessment

## 2015-01-03 NOTE — Progress Notes (Signed)
Dr [name] instilled Mitomycin med in foley.

## 2015-01-03 NOTE — Transfer of Care (Signed)
Immediate Anesthesia Transfer of Care Note  Patient: Tanner [name]  Procedure(s) Performed: Procedure(s): CYSTOSCOPY WITH URETHRAL DILATATION (N/A) CYSTOSCOPY WITH BIOPSY (N/A)  Patient Location: PACU  Anesthesia Type:General  Level of Consciousness: awake, alert , oriented and patient cooperative  Airway & Oxygen Therapy: Patient Spontanous Breathing and Patient connected to face mask oxygen  Post-op Assessment: Report given to RN, Post -op Vital signs reviewed and stable and Patient moving all extremities  Post vital signs: Reviewed and stable  Last Vitals:  Filed Vitals:   01/03/15 0833  BP:   Pulse: 65  Temp:   Resp: 13    Complications: No apparent anesthesia complications

## 2015-01-03 NOTE — Discharge Instructions (Signed)
Cystoscopy, Care After Refer to this sheet in the next few weeks. These instructions provide you with information on caring for yourself after your procedure. Your caregiver may also give you more specific instructions. Your treatment has been planned according to current medical practices, but problems sometimes occur. Call your caregiver if you have any problems or questions after your procedure. HOME CARE INSTRUCTIONS  Things you can do to ease any discomfort after your procedure include:  Drinking enough water and fluids to keep your urine clear or pale yellow.  Taking a warm bath to relieve any burning feelings. SEEK IMMEDIATE MEDICAL CARE IF:   You have an increase in blood in your urine.  You notice blood clots in your urine.  You have difficulty passing urine.  You have the chills.  You have abdominal pain.  You have a fever or persistent symptoms for more than 2-3 days.  You have a fever and your symptoms suddenly get worse. MAKE SURE YOU:   Understand these instructions.  Will watch your condition.  Will get help right away if you are not doing well or get worse. Document Released: 03/08/2005 Document Revised: 04/21/2013 Document Reviewed: 02/10/2012 [facility] Patient Information 2015 [place], [place]. This information is not intended to replace advice given to you by your health care provider. Make sure you discuss any questions you have with your health care provider.    General Anesthesia, Care After Refer to this sheet in the next few weeks. These instructions provide you with information on caring for yourself after your procedure. Your health care provider may also give you more specific instructions. Your treatment has been planned according to current medical practices, but problems sometimes occur. Call your health care provider if you have any problems or questions after your procedure. WHAT TO EXPECT AFTER THE PROCEDURE After the procedure, it is typical to  experience:  Sleepiness.  Nausea and vomiting. HOME CARE INSTRUCTIONS  For the first 24 hours after general anesthesia:  Have a responsible person with you.  Do not drive a car. If you are alone, do not take public transportation.  Do not drink alcohol.  Do not take medicine that has not been prescribed by your health care provider.  Do not sign important papers or make important decisions.  You may resume a normal diet and activities as directed by your health care provider.  Change bandages (dressings) as directed.  If you have questions or problems that seem related to general anesthesia, call the hospital and ask for the anesthetist or anesthesiologist on call. SEEK MEDICAL CARE IF:  You have nausea and vomiting that continue the day after anesthesia.  You develop a rash. SEEK IMMEDIATE MEDICAL CARE IF:   You have difficulty breathing.  You have chest pain.  You have any allergic problems. Document Released: 11/25/2000 Document Revised: 08/24/2013 Document Reviewed: 03/04/2013 [facility] Patient Information 2015 [place], [place]. This information is not intended to replace advice given to you by your health care provider. Make sure you discuss any questions you have with your health care provider.

## 2015-01-03 NOTE — Anesthesia Postprocedure Evaluation (Signed)
  Anesthesia Post-op Note  Patient: Tanner [name]  Procedure(s) Performed: Procedure(s) (LRB): CYSTOSCOPY WITH URETHRAL DILATATION (N/A) CYSTOSCOPY WITH BIOPSY (N/A)  Patient Location: PACU  Anesthesia Type: General  Level of Consciousness: awake and alert   Airway and Oxygen Therapy: Patient Spontanous Breathing  Post-op Pain: mild  Post-op Assessment: Post-op Vital signs reviewed, Patient's Cardiovascular Status Stable, Respiratory Function Stable, Patent Airway and No signs of Nausea or vomiting  Last Vitals:  Filed Vitals:   01/03/15 1130  BP: 147/78  Pulse: 59  Temp: 36.3 C  Resp: 16    Post-op Vital Signs: stable   Complications: No apparent anesthesia complications

## 2015-01-03 NOTE — Anesthesia Preprocedure Evaluation (Signed)
Anesthesia Evaluation  Patient identified by MRN, date of birth, ID band Patient awake    Reviewed: Allergy & Precautions, H&P , NPO status , Patient's Chart, lab work & pertinent test results, reviewed documented beta blocker date and time   Airway Mallampati: II  TM Distance: >3 FB Neck ROM: Full    Dental no notable dental hx.    Pulmonary former smoker,  breath sounds clear to auscultation  Pulmonary exam normal       Cardiovascular + CAD, + CABG (2012) and +CHF + dysrhythmias Atrial Fibrillation + Valvular Problems/Murmurs AI Rhythm:Regular Rate:Normal  Echo 04/2014  - Left ventricle: The cavity size was normal. Wall thickness wasincreased in a pattern of mild LVH. Systolic function was normal.The estimated ejection fraction was in the range of 55% to 60%.Wall motion was normal; there were no regional wall motionabnormalities. Doppler parameters are consistent with abnormalleft ventricular relaxation (grade 1 diastolic dysfunction). - Aortic valve: Trileaflet; moderately calcified leaflets. Therewas no stenosis. There was mild to moderate regurgitation. - Aorta: Mildly dilated aortic root. Aortic root dimension: 39 mm(ED). - Mitral valve: Mildly calcified annulus. Mildly calcified leaflets. There was mild regurgitation. - Left atrium: The atrium was mildly dilated. - Right ventricle: The cavity size was normal. Systolic functionwas normal. - Tricuspid valve: Peak RV-RA gradient (S): 23 mm Hg. - Pulmonary arteries: PA peak pressure: 26 mm Hg (S). - Inferior vena cava: The vessel was normal in size. The respirophasic diameter changes were in the normal range (= 50%),consistent with normal central venous pressure.  Impressions:  - Normal LV size with mild LV hypertrophy. EF is now normal,55-60%. Normal RV size and systolic function. Mild MR, mild to moderate aortic insufficiency. - Left ventricle: The cavity  size was normal. Wall thickness was increased in a pattern of mild LVH. Systolic function was normal. The estimated ejection fraction was in the range of 55% to 60%. Wall motion was normal; there were no regional wall motion abnormalities. Doppler parameters are consistent with abnormal left ventricular relaxation (grade 1 diastolic dysfunction). - Aortic valve: Trileaflet; moderately calcified leaflets. There was no stenosis. There was mild to moderate regurgitation. - Aorta: Mildly dilated aortic root. Aortic root dimension: 39 mm(ED). - Mitral valve: Mildly calcified annulus. Mildly calcified leaflets. There was mild regurgitation. - Left atrium: The atrium was mildly dilated. - Right ventricle: The cavity size was normal. Systolic functionwas normal. - Tricuspid valve: Peak RV-RA gradient (S): 23 mm Hg. - Pulmonary arteries: PA peak pressure: 26 mm Hg (S). - Inferior vena cava: The vessel was normal in size. The respirophasic diameter changes were in the normal range (= 50%),consistent with normal central venous pressure.  Impressions: - Normal LV size with mild LV hypertrophy. EF is now normal,55-60%. Normal RV size and systolic function. Mild MR, mild to moderate aortic insufficiency.    Neuro/Psych TIA Neuromuscular disease negative psych ROS   GI/Hepatic negative GI ROS, Neg liver ROS,   Endo/Other  negative endocrine ROS  Renal/GU Renal disease     Musculoskeletal  (+) Arthritis -,   Abdominal   Peds  Hematology negative hematology ROS (+)   Anesthesia Other Findings   Reproductive/Obstetrics                             Anesthesia Physical  Anesthesia Plan  ASA: III  Anesthesia Plan: General   Post-op Pain Management:    Induction: Intravenous  Airway Management Planned: LMA  Additional  Equipment: None  Intra-op Plan:   Post-operative Plan: Extubation in OR  Informed Consent: I have reviewed the  patients History and Physical, chart, labs and discussed the procedure including the risks, benefits and alternatives for the proposed anesthesia with the patient or authorized representative who has indicated his/her understanding and acceptance.   Dental advisory given  Plan Discussed with: CRNA  Anesthesia Plan Comments:         Anesthesia Quick Evaluation

## 2015-01-04 ENCOUNTER — Encounter (HOSPITAL_COMMUNITY): Payer: Self-pay | Admitting: Urology

## 2015-03-27 ENCOUNTER — Ambulatory Visit: Payer: Medicare Other | Admitting: Neurology

## 2015-04-05 ENCOUNTER — Ambulatory Visit (INDEPENDENT_AMBULATORY_CARE_PROVIDER_SITE_OTHER): Payer: Medicare Other | Admitting: Cardiology

## 2015-04-05 ENCOUNTER — Encounter: Payer: Self-pay | Admitting: Cardiology

## 2015-04-05 VITALS — BP 124/82 | HR 59 | Ht 70.75 in | Wt 158.0 lb

## 2015-04-05 DIAGNOSIS — R001 Bradycardia, unspecified: Secondary | ICD-10-CM

## 2015-04-05 DIAGNOSIS — I2510 Atherosclerotic heart disease of native coronary artery without angina pectoris: Secondary | ICD-10-CM | POA: Diagnosis not present

## 2015-04-05 DIAGNOSIS — Z955 Presence of coronary angioplasty implant and graft: Secondary | ICD-10-CM

## 2015-04-05 DIAGNOSIS — G459 Transient cerebral ischemic attack, unspecified: Secondary | ICD-10-CM

## 2015-04-05 DIAGNOSIS — I480 Paroxysmal atrial fibrillation: Secondary | ICD-10-CM | POA: Diagnosis not present

## 2015-04-05 DIAGNOSIS — E785 Hyperlipidemia, unspecified: Secondary | ICD-10-CM | POA: Diagnosis not present

## 2015-04-05 DIAGNOSIS — I351 Nonrheumatic aortic (valve) insufficiency: Secondary | ICD-10-CM | POA: Diagnosis not present

## 2015-04-05 NOTE — Patient Instructions (Signed)
No change in current medications   Your physician wants you to follow-up in 6 months [name].  You will receive a reminder letter in the mail two months in advance. If you don't receive a letter, please call our office to schedule the follow-up appointment.

## 2015-04-05 NOTE — Progress Notes (Signed)
PCP: Thressa Sheller, MD  Clinic Note: Chief Complaint  Patient presents with  . Follow-up     21month followup; no chest pain, no shortness of breath, no edema, no pain in legs, no cramping in legs, no lightheadedness, has occassional dizziness  . Coronary Artery Disease    PCI followed by CABG    HPI: Tanner [name] is a 81 y.o. male with a history of CAD initially in 1991 he had an LAD stent placed.   recurrent class IV angina in February 2012 --> Cath = diffuse RCA disease as well as LAD disease --> referred for 2 vessel CABG.   He had transient postoperative A. Fib at that time but no recurrences after cardioversion.   In April 2015  he had a TIA and was noted to have moderate to severe reduction in his EF.   This proved to be transient with an improved EF on followup echocardiogram in August. He does mild aortic insufficiency is noted on echocardiogram to evaluation.   He Myoview that showed a small apical anterolateral defect it was probably consistent with known occlusion of the diagonal filled by collaterals. This is been treated medically and he is not that symptomatic. 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   Interval History: He presents today with no major complaints. He remains physically active, and feels quite well.  He is still busy doing yard work as well as routine exercise on almost daily basis. He denies any resting or exertional chest tightness or pressure. No heart failure symptoms of PND, orthopnea or edema.he denies any back with irregular heartbeat/palpitations to suggest recurrence of A. Fib. Additionally no recurrence of any TIA or amaurosis fugax symptoms,or syncope/near syncope.He does have some dizziness episodes that are oftentimes associated with turning his head a certain way or rolling over certain way. Sometimes  related to orthostatic changes but usually just simply turning his head.  Past Medical History  Diagnosis Date  . CAD S/P percutaneous coronary angioplasty 1991; 10/2010    a) PCI - LAD in 1991; b) CATH 10/2010: Severe 2 Vessel disease = diffuse RCA ( 75% prox, 90% mid & 70 + 75% distal) & LAD (80-90% pre-stent, 75% post-stent) with occluded D1 (filled via OM-D1 collaterals)  -- CABG x 2; c) Myoview 4/'15: INTERMEDIATE RISK - small sized reversible apical/anterolateral defect -- most likely c/w known occlusion of D1 with collaterals.(med Rx)  . S/P CABG x 04 October 2010    LIMA-LAD, SVG to RPDA (Dr. [name])  . Postoperative atrial fibrillation February 2012    after bypass-"shocked back in and no problems since"  . TIA (transient ischemic attack) few months ago  . Transient Dilated idiopathic cardiomyopathy April 2015; 04/2014    a) in setting of TIA: EF 35-40% (reduced from 55-65 percent) mild AI, moderate and dilated aorta.;; b) Recheck Echo 04/2014: EF 55-60%, Gr 1 DD, mild-mod AI.    [name] Kitchen Hyperlipidemia with target LDL less than 70     controlled.  . TGA (transient global amnesia)     "x3 for hours then goes away"  . Osteoporosis   . Tremors of nervous system     light in hands-no problems since  . Arthritis     right shoulder   . Neuropathy of lower extremity   . Balance problem   . History of kidney stones 1959  .  Urinary bladder cancer Nov 2015    s/p resection in 07/2014 - Negative Cystoscopy Jan 2016   [place] reviewed - pertinent data in [place].  No Known Allergies  Current Outpatient Prescriptions on File Prior to Visit  Medication Sig Dispense Refill  . atorvastatin (LIPITOR) 80 MG tablet Take 80 mg by mouth at bedtime.     . calcium-vitamin D (OSCAL WITH D) 500-200 MG-UNIT per tablet Take 1 tablet by mouth daily.     . clopidogrel (PLAVIX) 75 MG tablet Take 1 tablet (75 mg total) by mouth daily. 90 tablet 2  . fish oil-omega-3 fatty acids 1000 MG capsule Take 1 g by mouth at  bedtime.     . metoprolol tartrate (LOPRESSOR) 25 MG tablet Take 0.5 tablets (12.5 mg total) by mouth 2 (two) times daily. 90 tablet 3  . Multiple Vitamin (MULTIVITAMIN) capsule Take 1 capsule by mouth at bedtime.     . Naproxen Sodium (ALEVE PO) Take 220 mg by mouth every morning.     No current facility-administered medications on file prior to visit.   History   Social History  . Marital Status: Married    Spouse Name: Stanton Kidney  . Number of Children: 0  . Years of Education: BS, MS   Occupational History  . Retired    Social History Main Topics  . Smoking status: Former Smoker -- 1.00 packs/day for 5 years    Types: Cigarettes    Quit date: 09/02/1957  . Smokeless tobacco: Never Used  . Alcohol Use: No  . Drug Use: No  . Sexual Activity: Not on file   Other Topics Concern  . None   Social History Narrative   Pt lives at home with spouse.   Caffeine Use: very little   Family History  Problem Relation Age of Onset  . Heart Problems Mother   . Parkinson's disease Father   . Heart Problems Father     ROS: A comprehensive Review of Systems - was performed Review of Systems  Constitutional: Negative for malaise/fatigue.  HENT: Negative for nosebleeds.   Respiratory: Negative for cough, shortness of breath and wheezing.   Cardiovascular: Negative for claudication.  Gastrointestinal: Negative for blood in stool and melena.  Genitourinary: Positive for frequency (Nocturia). Negative for dysuria and hematuria.  Musculoskeletal: Negative for myalgias.  Neurological: Positive for dizziness (postitional). Negative for loss of consciousness.       Neuropathy - BLE  Endo/Heme/Allergies: Does not bruise/bleed easily.  Psychiatric/Behavioral: Negative.   All other systems reviewed and are negative.  Wt Readings from Last 3 Encounters:  04/05/15 158 lb (71.668 kg)  01/03/15 159 lb 6 oz (72.292 kg)  12/26/14 159 lb 6.4 oz (72.303 kg)   PHYSICAL EXAM BP 124/82 mmHg  Pulse 59   Ht 5' 10.75" (1.797 m)  Wt 158 lb (71.668 kg)  BMI 22.19 kg/m2 General appearance: alert, cooperative, appears stated age, no distress; pleasant mood and affect very healthy-appearing.  Neck: no adenopathy, no carotid bruit and no JVD Lungs: clear to auscultation bilaterally, normal percussion bilaterally and non-labored Heart: regular rate and rhythm, S1, S2 normal, no murmur, click, rub or gallop; nondisplaced PMI Abdomen: soft, non-tender; bowel sounds normal; no masses,  no organomegaly;  no bruits Extremities: extremities normal, atraumatic, no cyanosis, and trace edema. Pulses: 2+ and symmetric;  Neurologic: Mental status: Alert, oriented, thought content appropriate Cranial nerves: normal (II-XII grossly intact)   Adult ECG Report  Rate:  59 ;  Rhythm: sinus bradycardia, premature  ventricular contractions (PVC) and Nonspecific ST-T wave changes. Otherwise normal EKG Recent Labs  none availablel:  PCP checks lipids   ASSESSMENT / PLAN: Two-vessel CAD status post CABG x2;  No real symptoms since his CABG. He has had a negative Myoview in 2015. I talked with him about his desires and he would prefer to not have another evaluation unless he has symptoms. He is on Plavix, statin and beta blocker.  TIA (transient ischemic attack) On statin and Plavix. No recurrent symptoms  AF (atrial fibrillation) -- Post Operative No recurrence is noted. It was unclear whether this was related to his TIA or not. He has no sensation of palpitations or irregular heartbeats.  Dyslipidemia, goal LDL below 70 He is on a statin at high dose. Presumably this is being followed by his PCP. Unfortunately I do not have the lab results.  Sinus bradycardia On minimal dose of beta blocker. Unable to titrate further.  Mild aortic insufficiency Consider reassessing an echocardiogram next year if symptoms warrant. I don't hear much in the way of AI on exam.   Orders Placed This Encounter  Procedures  .  EKG 12-Lead   Meds ordered this encounter  Medications  . OVER THE COUNTER MEDICATION    Sig: Take 1 tablet by mouth 2 (two) times daily. Neuroplenish (med name) Take 1 tab daily    No change her medications. Followup:  6 months  DAVID W. Ellyn Hack, M.D., M.S. Interventional Cardiologist [facility]

## 2015-04-08 ENCOUNTER — Encounter: Payer: Self-pay | Admitting: Cardiology

## 2015-04-08 NOTE — Assessment & Plan Note (Signed)
He is on a statin at high dose. Presumably this is being followed by his PCP. Unfortunately I do not have the lab results.

## 2015-04-08 NOTE — Assessment & Plan Note (Signed)
Consider reassessing an echocardiogram next year if symptoms warrant. I don't hear much in the way of AI on exam.

## 2015-04-08 NOTE — Assessment & Plan Note (Signed)
On statin and Plavix. No recurrent symptoms

## 2015-04-08 NOTE — Assessment & Plan Note (Signed)
No recurrence is noted. It was unclear whether this was related to his TIA or not. He has no sensation of palpitations or irregular heartbeats.

## 2015-04-08 NOTE — Assessment & Plan Note (Signed)
On minimal dose of beta blocker. Unable to titrate further.

## 2015-04-08 NOTE — Assessment & Plan Note (Signed)
No real symptoms since his CABG. He has had a negative Myoview in 2015. I talked with him about his desires and he would prefer to not have another evaluation unless he has symptoms. He is on Plavix, statin and beta blocker.

## 2015-04-12 ENCOUNTER — Ambulatory Visit (INDEPENDENT_AMBULATORY_CARE_PROVIDER_SITE_OTHER): Payer: Medicare Other | Admitting: Neurology

## 2015-04-12 ENCOUNTER — Encounter: Payer: Self-pay | Admitting: Neurology

## 2015-04-12 VITALS — BP 116/66 | HR 58 | Resp 16 | Ht 70.75 in | Wt 157.0 lb

## 2015-04-12 DIAGNOSIS — R278 Other lack of coordination: Secondary | ICD-10-CM | POA: Diagnosis not present

## 2015-04-12 DIAGNOSIS — G629 Polyneuropathy, unspecified: Secondary | ICD-10-CM | POA: Diagnosis not present

## 2015-04-12 DIAGNOSIS — R269 Unspecified abnormalities of gait and mobility: Secondary | ICD-10-CM

## 2015-04-12 NOTE — Patient Instructions (Signed)
Your exam is stable. Use a cane as needed for gait safety. Please pace yourself and don't overdo it. Stay well hydrated. Remember to drink plenty of fluid, eat healthy meals and do not skip any meals. Try to eat protein with a every meal and eat a healthy snack such as fruit or nuts in between meals. Try to keep a regular sleep-wake schedule and try to exercise daily, particularly in the form of walking, 20-30 minutes a day, if you can.   As far as your medications are concerned, I would like to suggest that you take your current medication with the following additional changes:     If you want to take a B vitamin, you can get a cheaper brand.   I would like to see you back in 6 months, sooner if we need to.   Our phone number is [phone]. We also have an after hours call service for urgent matters and there is a physician on-call for urgent questions, that cannot wait till the next work day. For any emergencies you know to call 911 or go to the nearest emergency room.

## 2015-04-12 NOTE — Progress Notes (Signed)
Subjective:    Patient ID: Tanner [name] is a 81 y.o. male.  HPI     Interim history:   Mr. Tanner [name] is a very pleasant 81 year old right-handed gentleman with an underlying medical history of heart disease, s/p angioplasty in 1991 and then 2 vessel CABG in 2012, hyperlipidemia, previous a fib after CABG, s/p cardioversion, osteoporosis, osteoarthritis and BPH, who presents for followup consultation of his gait disorder, neuropathy and sensory ataxia. He is unaccompanied today. I last saw him on 09/26/2014, at which time he reported doing the same. He had not fallen, he was not using his cane. He was active, was still driving. He reported bladder cancer surgery on 07/12/2014. He is followed by urologist, Dr. [name]. I suggested he start using a cane for gait safety. I asked him to ensure to be better hydrated.  Today, 04/12/2015: He reports doing the same, has not been using a cane, but had accupuncture, 4 treatments and started a B vitamin supplement. He did not notice a difference. He has been very active. He sees a dermatologist and had a lesion removed from his top of his head. He bumped his head this morning at the back of the head, but had no pain, no LOC. He bruises easily, on Plavix. No recent falls. 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   Previously:   I saw him on 03/25/15 at which time he felt stable, no falls. He was not using his cane.   I saw him on 06/21/2013, at which time he had physical examination findings consistent with neuropathy and mild sensory ataxia and I felt his exam was stable. We checked heavy metals at the time which were unremarkable. He was reminded not to climb ladders or be at heights and stay well-hydrated and not to get overheated. In the interim, on 12/09/2013 he presented to the emergency room with new onset poor balance, word finding difficulties, confusion and dizziness. He was working on a rental house fixing a light fixture when he began to feel this way  and overall symptoms lasted about 6 hours. Previously he has a history of transient global amnesia. Neurology was consulted and felt that it was more likely a TIA and he therefore he was admitted for TIA workup. He was switched to Plavix. Carotid Doppler studies were done as well as echocardiogram and CT head an MRI and MRA. I reviewed all the hospital test results. Carotid Doppler test results from 12/10/2013 showed: Bilateral: 1-39% ICA stenosis. Echocardiogram from 12/10/2013 showed: He was 35-40, mild aortic regurgitation, moderate aortic dilatation, mild mitral valve regurgitation, right atrium was mildly to moderately dilated. Cardiology was consulted. CT head from 12/09/2013 without contrast showed: Atrophy with periventricular small vessel disease. No intracranial mass, hemorrhage, or acute appearing infarct. MRI and MRA brain from 12/10/2013 showed: No acute intracranial infarct or other abnormality identified. Small remote right frontal lobe infarct as above. Mild atrophy with moderate chronic microvascular ischemic disease. Normal MRA of the brain without evidence of high-grade flow-limiting stenosis, proximal branch occlusion, or other acute abnormality. No intracranial aneurysm.  He saw my colleague Dr. [name] on 01/20/2014 for TIA followup from the admission. He was advised to continue with Plavix. He was advised regarding secondary stroke prevention and lipid control.  I first met him on 03/11/2013, which time I suggested further workup in the form of blood work and EMG and nerve conduction testing. He reported a one-year history of lower extremity numbness. He has no Hx of EtOH abuse.  He helps build houses. He does not use a cane or walker. He denies back pain. He has had no exposures to toxins.   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 He and his wife live at [facility]; they have been married 64 years and they have no children.   He reported no tingling, no burning, no pain, no tenderness, but occasional cramping. His balance is not as good, especially with his eyes closed. He describes no claudication.   His blood work showed for the most part normal findings with the exception of a borderline elevated CK-MB. I tested ANA, ESR, B12, B1, vitamin D, RPR, SPEP, immunofluorescent electrophoresis him a TSH, CRP, CMP, all of which were normal. 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 EMG evaluation of the right lower extremity shows distal denervation that is acute and chronic consistent with the diagnosis of peripheral neuropathy. There is mild acute denervation in the lumbosacral paraspinal muscles, and a mild overlying lumbosacral radiculopathy of indeterminate level is suspected. Clinical correlation is required.    His Past Medical History Is Significant For: Past Medical History  Diagnosis Date  . CAD S/P percutaneous coronary angioplasty 1991; 10/2010    a) PCI - LAD in 1991; b) CATH 10/2010: Severe 2 Vessel disease = diffuse RCA ( 75% prox, 90% mid & 70 + 75% distal) & LAD (80-90% pre-stent, 75% post-stent) with occluded D1 (filled via OM-D1 collaterals)  -- CABG x 2; c) Myoview 4/'15: INTERMEDIATE RISK - small sized reversible apical/anterolateral defect -- most likely c/w known occlusion of D1 with collaterals.(med Rx)  . S/P CABG x 04 October 2010    LIMA-LAD, SVG to RPDA (Dr. [name])  . Postoperative atrial fibrillation February 2012    after bypass-"shocked back in and no problems since"  . TIA (transient ischemic attack) few months ago  . Transient Dilated idiopathic cardiomyopathy April 2015; 04/2014    a) in setting of TIA: EF 35-40% (reduced  from 55-65 percent) mild AI, moderate and dilated aorta.;; b) Recheck Echo 04/2014: EF 55-60%, Gr 1 DD, mild-mod AI.    [name] Kitchen Hyperlipidemia with target LDL less than 70     controlled.  . TGA (transient global amnesia)     "x3 for hours then goes away"  . Osteoporosis   . Tremors of nervous system     light in hands-no problems since  . Arthritis     right shoulder   . Neuropathy of lower extremity   . Balance problem   . History of kidney stones 1959  . Urinary bladder cancer Nov 2015    s/p resection in 07/2014 - Negative Cystoscopy Jan 2016    His Past Surgical History Is Significant For: Past Surgical History  Procedure Laterality Date  . Doppler echocardiography  10/18/2004    EF 55-65%,BORDERLINE -enlarged aortic root,mild aortic insuff.,trace tricus.insuff.  . Nm myocar perf wall motion  07/24/1998    EF 71%  . Transthoracic echocardiogram  April 2015    EF 35-40%, mild AI, moderately dilated ascending aorta  . Nm myoview ltd  12/16/2013    INTERMEDIATE RISK test with small sized reversible perfusion defect in the apical anterolateral wall --  most likely consistent with known occlusion of D1 with collaterals.  . Cystoscopy w/ retrogrades N/A 07/12/2014    Procedure: CYSTOSCOPY WITH RETROGRADE PYELOGRAM;  Surgeon: [name], MD;  Location: [facility];  Service: Urology;  Laterality: N/A;  . Transurethral resection of prostate N/A 07/12/2014    Procedure: TRANSURETHRAL RESECTION OF THE PROSTATE (TURP) WITH MITOMYCIN -C;  Surgeon: [name], MD;  Location: [facility];  Service: Urology;  Laterality: N/A;  . Kidney stone surgery  1989  . Tonsillectomy  as teen  . Appendectomy  as teen  . Coccyx removal    . Cataract extraction Bilateral ~10 years ago  . Basal cell carcinoma excision  07/2014    "goes every 6 months"  . Cardioversion  2012    after bypass surgery  . Cystoscopy with biopsy N/A 09/09/2014    Procedure: CYSTO WITH BIOPSY AND FULGERATION, DILATION OF URETHRAL  STRICTURE;  Surgeon: [name], MD;  Location: [facility];  Service: Urology;  Laterality: N/A;  BLADDER BIOPSY  . Coronary artery bypass graft  10/2010    LIMA-LAD, SVG-rPDA  . Cardiac catheterization  10/17/2010    80%prox w/mild in-stent restenosis w/75% mid-LADstenosis aft the stent,multi severe stenoses RCAw/heavily calcified vessel,norm LV  . Heart stent placement  10-18-10    2 vessel  . Shoulder surgery Right 1988  . Cystoscopy with urethral dilatation N/A 01/03/2015    Procedure: CYSTOSCOPY WITH URETHRAL DILATATION;  Surgeon: [name], MD;  Location: [facility];  Service: Urology;  Laterality: N/A;  . Cystoscopy with biopsy N/A 01/03/2015    Procedure: CYSTOSCOPY WITH BIOPSY;  Surgeon: [name], MD;  Location: [facility];  Service: Urology;  Laterality: N/A;    His Family History Is Significant For: Family History  Problem Relation Age of Onset  . Heart Problems Mother   . Parkinson's disease Father   . Heart Problems Father     His Social History Is Significant For: Social History   Social History  . Marital Status: Married    Spouse Name: Stanton Kidney  . Number of Children: 0  . Years of Education: BS, MS   Occupational History  . Retired    Social History Main Topics  . Smoking status: Former Smoker -- 1.00 packs/day for 5 years    Types: Cigarettes    Quit date: 09/02/1957  . Smokeless tobacco: Never Used  . Alcohol Use: No  . Drug Use: No  . Sexual Activity: Not on file   Other Topics Concern  . Not on file   Social History Narrative   Pt lives at home with spouse.   Caffeine Use: very little    His Allergies Are:  No Known Allergies:   His Current Medications Are:  Outpatient Encounter Prescriptions as of 04/12/2015  Medication Sig  . atorvastatin (LIPITOR) 80 MG tablet Take 80 mg by mouth at bedtime.   . calcium-vitamin D (OSCAL WITH D) 500-200 MG-UNIT per tablet Take 1 tablet by mouth daily.   . clopidogrel (PLAVIX) 75 MG tablet Take 1 tablet  (75 mg total) by mouth daily.  . fish oil-omega-3 fatty acids 1000 MG capsule Take 1 g by mouth at bedtime.   . metoprolol tartrate (LOPRESSOR) 25 MG tablet Take 0.5 tablets (12.5 mg total) by mouth 2 (two) times daily.  . Multiple Vitamin (MULTIVITAMIN) capsule Take 1 capsule by mouth at bedtime.   . Naproxen Sodium (ALEVE PO) Take 220 mg by mouth every morning.  [name] Kitchen OVER THE COUNTER MEDICATION Take 1  tablet by mouth 2 (two) times daily. Neuroplenish (med name) Take 1 tab daily   No facility-administered encounter medications on file as of 04/12/2015.  :  Review of Systems:  Out of a complete 14 point review of systems, all are reviewed and negative with the exception of these symptoms as listed below:  Review of Systems  Neurological:       Patient reports that he is the same as last visit. Numbness in both feet and in L leg. States that he is stumbling more, no falls reported. Reports new onset of dizziness.     Objective:  Neurologic Exam  Physical Exam Physical Examination:   Filed Vitals:   04/12/15 1058  BP: 116/66  Pulse: 58  Resp:    General Examination: The patient is a very pleasant 81 y.o. male in no acute distress. He appears well-developed and well-nourished and well groomed.   HEENT: Normocephalic, atraumatic but has a small, nickel sized bruise in the back of his head, no tenderness, no swelling. Pupils are equal, round and reactive to light and accommodation. Funduscopic exam is normal with sharp disc margins noted. He is s/p cataract repairs b/l. Extraocular tracking is good without limitation to gaze excursion or nystagmus noted. Normal smooth pursuit is noted. Hearing is grossly intact. Face is symmetric with normal facial animation and normal facial sensation. Speech is clear with no dysarthria noted. There is no hypophonia. There is no lip, neck/head, jaw or voice tremor. Neck is supple with full range of passive and active motion. There are no carotid bruits on  auscultation. Oropharynx exam reveals: mild mouth dryness, adequate dental hygiene and mild airway crowding, due to redundant soft palate. Mallampati is class II. Tongue protrudes centrally and palate elevates symmetrically.   Chest: Clear to auscultation without wheezing, rhonchi or crackles noted.  Heart: S1+S2+0, slightly irregular without murmurs, rubs or gallops noted.   Abdomen: Soft, non-tender and non-distended with normal bowel sounds appreciated on auscultation.  Extremities: There is trace pitting edema around both ankles. He was wearing long knee-high socks and his feet are slightly pale and colder, unchanged, his pedal pulses are faintly palpable.   Skin: Warm and dry without trophic changes noted. There are no varicose veins, except L leg some, but he does have some spider veins. He has some bruising on his forearms.   Musculoskeletal: exam reveals no obvious joint deformities, tenderness or joint swelling or erythema.   Neurologically:  Mental status: The patient is awake, alert and oriented in all 4 spheres. His memory, attention, language and knowledge are appropriate. There is no aphasia, agnosia, apraxia or anomia. Speech is clear with normal prosody and enunciation. Thought process is linear. Mood is congruent and affect is normal. He is very pleasant and conversant. Cranial nerves are as described above under HEENT exam. In addition, shoulder shrug is normal with equal shoulder height noted. Motor exam: Normal bulk, strength and tone is noted. There is no drift, tremor or rebound. Romberg is positive. Reflexes are 1+ in the upper extremities, 1+ in both knees and absent in both ankles. Fine motor skills are intact with normal finger taps, normal hand movements, normal rapid alternating patting, normal foot taps and normal foot agility.  Cerebellar testing shows no dysmetria or intention tremor on finger to nose testing. Heel to shin is unremarkable bilaterally. There is no  truncal ataxia.  Sensory exam is intact to light touch, pinprick, vibration, temperature sense in the upper extremities, and decrease in pinprick sensation,  temperature and vibration in the distal lower extremities, up to mid shin area b/l, right a little better, unchanged. Gait, station and balance: He stands up with no difficulty but his stance is mildly wide-based. He walks slightly cautiously and mildly wide-based. He turns well. Tandem walk is not possible and balance is mildly impaired, all unchanged from last time.   Assessment and Plan:   In summary, DAESEAN LAZARZ is a very pleasant 81 year old male with an underlying medical history of heart disease, s/p angioplasty in 1991 and then 2 vessel CABG in 2012, hyperlipidemia, previous a fib after CABG, s/p cardioversion, osteoporosis, osteoarthritis and BPH, who presents for followup consultation of his gait disorder, neuropathy and sensory ataxia. He has a fairly stable exam, which is reassuring. He may have had a TIA in 4/15, but is doing well, on Plavix without complications and no recent TIA-type Sx. His EMG and nerve conduction testing in the past confirmed peripheral neuropathy, but he has been stable in that regard. We checked heavy metals, which were fine. He has no pain. We checked labs in the past, all non-revealing.  I had a long chat with the patient about my findings and the diagnosis of gait d/o, PN, the prognosis and treatment options. We talked about medical treatments and non-pharmacological approaches. We talked about trying to maintain a healthy lifestyle in general and we again talked about secondary stroke prevention and risk for dehydration. I encouraged the patient to eat healthy, exercise daily and keep well hydrated, to keep a scheduled bedtime and wake time routine, to not skip any meals and eat healthy snacks in between meals and to have protein with every meal. I also warned him again about getting overheated and dehydrated  and would like for him to be extra cautious and not to overdo it physically. I would like for him to use his cane as needed for safety, but he has been reluctant. He is again advised against climbing ladders or be at heights. He is advised to followup with me in about 6 months, sooner if the need arises and is encouraged him to call with any interim questions.

## 2015-09-26 ENCOUNTER — Other Ambulatory Visit: Payer: Self-pay | Admitting: Cardiology

## 2015-09-26 NOTE — Telephone Encounter (Signed)
Rx request sent to pharmacy.  

## 2015-10-09 ENCOUNTER — Ambulatory Visit: Payer: Medicare Other | Admitting: Neurology

## 2015-10-16 ENCOUNTER — Ambulatory Visit (INDEPENDENT_AMBULATORY_CARE_PROVIDER_SITE_OTHER): Payer: Medicare Other | Admitting: Neurology

## 2015-10-16 ENCOUNTER — Encounter: Payer: Self-pay | Admitting: Neurology

## 2015-10-16 VITALS — BP 140/68 | HR 58 | Resp 16 | Ht 70.75 in | Wt 157.0 lb

## 2015-10-16 DIAGNOSIS — G629 Polyneuropathy, unspecified: Secondary | ICD-10-CM

## 2015-10-16 DIAGNOSIS — R269 Unspecified abnormalities of gait and mobility: Secondary | ICD-10-CM

## 2015-10-16 NOTE — Patient Instructions (Signed)
You are looking well, you're exam is stable, which is very reassuring.  Keep active, use a cane for safety.  Good nutrition, good hydration and regular exercise are important, keep up the good work.  Drive with caution, avoid night time driving and highways.

## 2015-10-16 NOTE — Progress Notes (Signed)
Subjective:    Patient ID: Tanner [name] is a 82 y.o. male.  HPI     Interim history:   Tanner [name] is a very pleasant 82 year old right-handed gentleman with an underlying medical history of heart disease, s/p angioplasty in 1991 and then 2 vessel CABG in 2012, hyperlipidemia, previous a fib after CABG, s/p cardioversion, osteoporosis, osteoarthritis and BPH, who presents for followup consultation of his gait disorder, neuropathy and sensory ataxia. He is unaccompanied today. I last saw him on 04/12/2015, at which time he reported doing about the same. He was not using a cane. He had undergone 4 treatments of acupuncture and had started a vitamin B supplement. He did not notice any significant difference in his balance or walking. He had had no recent falls. His exam was stable.   Today, 10/16/2015: He reports feeling about the same, numbness unchanged, no pain, no tingling, still not using a cane, has shoveled some snow he admits, but used his shovel as a cane (!). He uses a step ladder with 2 steps sometimes, no ladders. No recent falls, very active. He is careful with driving, mostly local roads, under speed limits, rarely after dark. He goes to the exercise/fitness room with his wife almost each night at [facility] for 30 minutes.   Previously:   I saw him on 09/26/2014, at which time he reported doing the same. He had not fallen, he was not using his cane. He was active, was still driving. He reported bladder cancer surgery on 07/12/2014. He is followed by urologist, Dr. [name]. I suggested he start using a cane for gait safety. I asked him to ensure to be better hydrated.  I saw him on 03/25/15 at which time he felt stable, no falls. He was not using his cane.   I saw him on 06/21/2013, at which time he had physical examination findings consistent with neuropathy and mild sensory ataxia and I felt his exam was stable. We checked heavy metals at the time which were unremarkable. He was  reminded not to climb ladders or be at heights and stay well-hydrated and not to get overheated. In the interim, on 12/09/2013 he presented to the emergency room with new onset poor balance, word finding difficulties, confusion and dizziness. He was working on a rental house fixing a light fixture when he began to feel this way and overall symptoms lasted about 6 hours. Previously he has a history of transient global amnesia. Neurology was consulted and felt that it was more likely a TIA and he therefore he was admitted for TIA workup. He was switched to Plavix. Carotid Doppler studies were done as well as echocardiogram and CT head an MRI and MRA. I reviewed all the hospital test results. Carotid Doppler test results from 12/10/2013 showed: Bilateral: 1-39% ICA stenosis. Echocardiogram from 12/10/2013 showed: He was 35-40, mild aortic regurgitation, moderate aortic dilatation, mild mitral valve regurgitation, right atrium was mildly to moderately dilated. Cardiology was consulted. CT head from 12/09/2013 without contrast showed: Atrophy with periventricular small vessel disease. No intracranial mass, hemorrhage, or acute appearing infarct. MRI and MRA brain from 12/10/2013 showed: No acute intracranial infarct or other abnormality identified. Small remote right frontal lobe infarct as above. Mild atrophy with moderate chronic microvascular ischemic disease. Normal MRA of the brain without evidence of high-grade flow-limiting stenosis, proximal branch occlusion, or other acute abnormality. No intracranial aneurysm.  He saw my colleague Dr. [name] on 01/20/2014 for TIA followup from the admission. He was  advised to continue with Plavix. He was advised regarding secondary stroke prevention and lipid control.  I first met him on 03/11/2013, which time I suggested further workup in the form of blood work and EMG and nerve conduction testing. He reported a one-year history of lower extremity numbness. He has no Hx of  EtOH abuse. He helps build houses. He does not use a cane or walker. He denies back pain. He has had no exposures to toxins.   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 He and his wife live at [facility]; they have been married 34 years and they have no children.   He reported no tingling, no burning, no pain, no tenderness, but occasional cramping. His balance is not as good, especially with his eyes closed. He describes no claudication.   His blood work showed for the most part normal findings with the exception of a borderline elevated CK-MB. I tested ANA, ESR, B12, B1, vitamin D, RPR, SPEP, immunofluorescent electrophoresis him a TSH, CRP, CMP, all of which were normal. 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 EMG evaluation of the right lower extremity shows distal denervation that is acute and chronic consistent with the diagnosis of peripheral neuropathy. There is mild acute denervation in the lumbosacral paraspinal muscles, and a mild overlying lumbosacral radiculopathy of indeterminate level is suspected. Clinical correlation is required.    His Past Medical History Is Significant For: Past Medical History  Diagnosis Date  . CAD S/P percutaneous coronary angioplasty 1991; 10/2010    a) PCI - LAD in 1991; b) CATH 10/2010: Severe 2 Vessel disease = diffuse RCA ( 75% prox, 90% mid & 70 + 75% distal) & LAD (80-90% pre-stent, 75% post-stent) with occluded D1 (filled via OM-D1 collaterals)  -- CABG x 2; c) Myoview 4/'15: INTERMEDIATE RISK - small sized reversible apical/anterolateral defect -- most likely c/w known occlusion of D1 with collaterals.(med Rx)  . S/P  CABG x 04 October 2010    LIMA-LAD, SVG to RPDA (Dr. [name])  . Postoperative atrial fibrillation [facility]) February 2012    after bypass-"shocked back in and no problems since"  . TIA (transient ischemic attack) few months ago  . Transient Dilated idiopathic cardiomyopathy April 2015; 04/2014    a) in setting of TIA: EF 35-40% (reduced from 55-65 percent) mild AI, moderate and dilated aorta.;; b) Recheck Echo 04/2014: EF 55-60%, Gr 1 DD, mild-mod AI.    [name] Kitchen Hyperlipidemia with target LDL less than 70     controlled.  . TGA (transient global amnesia)     "x3 for hours then goes away"  . Osteoporosis   . Tremors of nervous system     light in hands-no problems since  . Arthritis     right shoulder   . Neuropathy of lower extremity   . Balance problem   . History of kidney stones 1959  . Urinary bladder cancer [facility]) Nov 2015    s/p resection in 07/2014 - Negative Cystoscopy Jan 2016    His Past Surgical History Is Significant For: Past Surgical History  Procedure Laterality Date  . Doppler echocardiography  10/18/2004    EF 55-65%,BORDERLINE -enlarged aortic root,mild aortic  insuff.,trace tricus.insuff.  . Nm myocar perf wall motion  07/24/1998    EF 71%  . Transthoracic echocardiogram  April 2015    EF 35-40%, mild AI, moderately dilated ascending aorta  . Nm myoview ltd  12/16/2013    INTERMEDIATE RISK test with small sized reversible perfusion defect in the apical anterolateral wall -- most likely consistent with known occlusion of D1 with collaterals.  . Cystoscopy w/ retrogrades N/A 07/12/2014    Procedure: CYSTOSCOPY WITH RETROGRADE PYELOGRAM;  Surgeon: [name], MD;  Location: [facility];  Service: Urology;  Laterality: N/A;  . Transurethral resection of prostate N/A 07/12/2014    Procedure: TRANSURETHRAL RESECTION OF THE PROSTATE (TURP) WITH MITOMYCIN -C;  Surgeon: [name], MD;  Location: [facility];  Service: Urology;  Laterality: N/A;  . Kidney stone surgery  1989  .  Tonsillectomy  as teen  . Appendectomy  as teen  . Coccyx removal    . Cataract extraction Bilateral ~10 years ago  . Basal cell carcinoma excision  07/2014    "goes every 6 months"  . Cardioversion  2012    after bypass surgery  . Cystoscopy with biopsy N/A 09/09/2014    Procedure: CYSTO WITH BIOPSY AND FULGERATION, DILATION OF URETHRAL STRICTURE;  Surgeon: [name], MD;  Location: [facility];  Service: Urology;  Laterality: N/A;  BLADDER BIOPSY  . Coronary artery bypass graft  10/2010    LIMA-LAD, SVG-rPDA  . Cardiac catheterization  10/17/2010    80%prox w/mild in-stent restenosis w/75% mid-LADstenosis aft the stent,multi severe stenoses RCAw/heavily calcified vessel,norm LV  . Heart stent placement  10-18-10    2 vessel  . Shoulder surgery Right 1988  . Cystoscopy with urethral dilatation N/A 01/03/2015    Procedure: CYSTOSCOPY WITH URETHRAL DILATATION;  Surgeon: [name], MD;  Location: [facility];  Service: Urology;  Laterality: N/A;  . Cystoscopy with biopsy N/A 01/03/2015    Procedure: CYSTOSCOPY WITH BIOPSY;  Surgeon: [name], MD;  Location: [facility];  Service: Urology;  Laterality: N/A;    His Family History Is Significant For: Family History  Problem Relation Age of Onset  . Heart Problems Mother   . Parkinson's disease Father   . Heart Problems Father     His Social History Is Significant For: Social History   Social History  . Marital Status: Married    Spouse Name: Stanton Kidney  . Number of Children: 0  . Years of Education: BS, MS   Occupational History  . Retired    Social History Main Topics  . Smoking status: Former Smoker -- 1.00 packs/day for 5 years    Types: Cigarettes    Quit date: 09/02/1957  . Smokeless tobacco: Never Used  . Alcohol Use: No  . Drug Use: No  . Sexual Activity: Not Asked   Other Topics Concern  . None   Social History Narrative   Pt lives at home with spouse.   Caffeine Use: very little    His Allergies Are:  No  Known Allergies:   His Current Medications Are:  Outpatient Encounter Prescriptions as of 10/16/2015  Medication Sig  . atorvastatin (LIPITOR) 80 MG tablet Take 80 mg by mouth at bedtime.   . calcium-vitamin D (OSCAL WITH D) 500-200 MG-UNIT per tablet Take 1 tablet by mouth daily.   . clopidogrel (PLAVIX) 75 MG tablet Take 1 tablet (75 mg total) by mouth daily.  . [name] oil-omega-3 fatty acids 1000 MG capsule Take 1 g by mouth at bedtime.   [name] Kitchen  metoprolol tartrate (LOPRESSOR) 25 MG tablet TAKE 1/2 TABLET BY MOUTH TWICE DAILY  . Multiple Vitamin (MULTIVITAMIN) capsule Take 1 capsule by mouth at bedtime.   . Naproxen Sodium (ALEVE PO) Take 220 mg by mouth every morning.  [name] Kitchen OVER THE COUNTER MEDICATION Take 1 tablet by mouth 2 (two) times daily. Neuroplenish (med name) Take 1 tab daily   No facility-administered encounter medications on file as of 10/16/2015.  :  Review of Systems:  Out of a complete 14 point review of systems, all are reviewed and negative with the exception of these symptoms as listed below:   Review of Systems  Neurological:       Patient reports that his numbness in both legs is no different since last visit. No new concerns.     Objective:  Neurologic Exam  Physical Exam   Physical Examination:   Filed Vitals:   10/16/15 1309  BP: 140/68  Pulse: 58  Resp: 16   General Examination: The patient is a very pleasant 82 y.o. male in no acute distress. He appears well-developed and well-nourished and well groomed. He is in good spirits today, as is his usual.  HEENT: Normocephalic, atraumatic. Pupils are equal, round and reactive to light and accommodation. Funduscopic exam is normal with sharp disc margins noted. He is s/p cataract repairs b/l. Extraocular tracking is good without limitation to gaze excursion or nystagmus noted. Normal smooth pursuit is noted. Hearing is grossly intact. Face is symmetric with normal facial animation and normal facial sensation. Speech  is clear with no dysarthria noted. There is no hypophonia. There is no lip, neck/head, jaw or voice tremor. Neck is supple with full range of passive and active motion. There are no carotid bruits on auscultation. Oropharynx exam reveals: mild mouth dryness, adequate dental hygiene and mild airway crowding, due to redundant soft palate. Mallampati is class II. Tongue protrudes centrally and palate elevates symmetrically.   Chest: Clear to auscultation without wheezing, rhonchi or crackles noted.  Heart: S1+S2+0, slightly irregular without murmurs, rubs or gallops noted.   Abdomen: Soft, non-tender and non-distended with normal bowel sounds appreciated on auscultation.  Extremities: There is trace pitting edema around both ankles. He was wearing long knee-high socks and his feet are slightly pale and colder, unchanged, his pedal pulses are faintly palpable.   Skin: Warm and dry without trophic changes noted. There are no varicose veins, except L leg some, but he does have some spider veins. He has some bruising on his forearms. Age-related changes are noted on his arms and legs. He has some scaly dry patches on his scalp.  Musculoskeletal: exam reveals no obvious joint deformities, tenderness or joint swelling or erythema.   Neurologically:  Mental status: The patient is awake, alert and oriented in all 4 spheres. His memory, attention, language and knowledge are appropriate. There is no aphasia, agnosia, apraxia or anomia. Speech is clear with normal prosody and enunciation. Thought process is linear. Mood is congruent and affect is normal. He is very pleasant and conversant. Cranial nerves are as described above under HEENT exam. In addition, shoulder shrug is normal with equal shoulder height noted. Motor exam: Normal bulk, strength and tone is noted. There is no drift, tremor or rebound. Romberg is positive. Reflexes are 1+ in the upper extremities, 1+ in both knees and absent in both ankles.  Fine motor skills are intact with normal finger taps, normal hand movements, normal rapid alternating patting, normal foot taps and normal foot agility.  Cerebellar testing shows no dysmetria or intention tremor on finger to nose testing. Heel to shin is unremarkable bilaterally. There is no truncal ataxia.  Sensory exam is intact to light touch, pinprick, vibration, temperature sense in the upper extremities, and decrease in pinprick sensation, temperature and vibration in the distal lower extremities, up to mid shin area b/l, right side slightly little better, unchanged. Gait, station and balance: He stands up with no difficulty but his stance is mildly wide-based. He walks slightly cautiously and mildly wide-based. He turns well. Tandem walk is not possible and balance is mildly impaired, all unchanged from last time.   Assessment and Plan:   In summary, Tanner [name] is a very pleasant 82 year old male with an underlying medical history of heart disease, s/p angioplasty in 1991 and then 2 vessel CABG in 2012, hyperlipidemia, previous a fib after CABG, s/p cardioversion, osteoporosis, osteoarthritis and BPH, who presents for followup consultation of his gait disorder, neuropathy and sensory ataxia. He has a fairly stable exam, which is reassuring. He may have had a TIA in April 2015, but is doing well, on Plavix without complications and no recent TIA-type Sx. His EMG and nerve conduction testing in the past confirmed peripheral neuropathy, but he has been stable in that regard. We checked heavy metals, which were fine. He has no pain. We checked labs in the past, all non-revealing.  I again discussed with the patient the diagnosis of gait d/o, PN, the prognosis and treatment options. We talked about medical treatments and non-pharmacological approaches. We talked about trying to maintain a healthy lifestyle in general and we again talked about secondary stroke prevention and risk for dehydration. I  encouraged the patient to eat healthy, exercise daily and keep well hydrated, to keep a scheduled bedtime and wake time routine, to not skip any meals and eat healthy snacks in between meals and to have protein with every meal. I also warned him again about getting overheated and dehydrated and would like for him to be extra cautious and not to overdo it physically. I would like for him to use his cane as needed for safety, but he has been reluctant. He is again advised against climbing ladders or be at heights. He does use a step ladder from time to time. He has been cautious with his driving. He has been very reasonable and appears to have a very good judgment of his limitations. I'm pleased to see that he continues to take very good care of himself.  He is advised to followup with me in about 6 months, sooner if the need arises and is encouraged him to call with any interim questions.  I spent 20 minutes in total face-to-face time with the patient, more than 50% of which was spent in counseling and coordination of care, reviewing test results, reviewing medication and discussing or reviewing the diagnosis of gait d/o, PN, the prognosis and treatment options.

## 2015-10-26 ENCOUNTER — Ambulatory Visit (INDEPENDENT_AMBULATORY_CARE_PROVIDER_SITE_OTHER): Payer: Medicare Other | Admitting: Cardiology

## 2015-10-26 ENCOUNTER — Encounter: Payer: Self-pay | Admitting: Cardiology

## 2015-10-26 VITALS — BP 134/68 | HR 69 | Ht 70.5 in | Wt 160.7 lb

## 2015-10-26 DIAGNOSIS — I480 Paroxysmal atrial fibrillation: Secondary | ICD-10-CM | POA: Diagnosis not present

## 2015-10-26 DIAGNOSIS — I420 Dilated cardiomyopathy: Secondary | ICD-10-CM | POA: Diagnosis not present

## 2015-10-26 DIAGNOSIS — E785 Hyperlipidemia, unspecified: Secondary | ICD-10-CM | POA: Diagnosis not present

## 2015-10-26 DIAGNOSIS — I2510 Atherosclerotic heart disease of native coronary artery without angina pectoris: Secondary | ICD-10-CM | POA: Diagnosis not present

## 2015-10-26 DIAGNOSIS — Z955 Presence of coronary angioplasty implant and graft: Secondary | ICD-10-CM

## 2015-10-26 DIAGNOSIS — I351 Nonrheumatic aortic (valve) insufficiency: Secondary | ICD-10-CM

## 2015-10-26 NOTE — Patient Instructions (Signed)
No changes in current medication  Your physician wants you to follow-up in 6 months with Dr Ellyn Hack. You will receive a reminder letter in the mail two months in advance. If you don't receive a letter, please call our office to schedule the follow-up appointment.   If you need a refill on your cardiac medications before your next appointment, please call your pharmacy.

## 2015-10-26 NOTE — Progress Notes (Signed)
PCP: Thressa Sheller, MD  Clinic Note: Chief Complaint  Patient presents with  . Follow-up    6 months//pt c/o SOB since he got a cold over a week ago, no other Sx.  . Coronary Artery Disease  . Atrial Fibrillation    HPI: Tanner [name] is a 82 y.o. male with a history of CAD initially in 1991 he had an LAD stent placed.   recurrent class IV angina in February 2012 --> Cath = diffuse RCA disease as well as LAD disease --> referred for 2 vessel CABG.   He had transient postoperative A. Fib at that time but no recurrences after cardioversion.   In April 2015  he had a TIA and was noted to have moderate to severe reduction in his EF.   This proved to be transient with an improved EF on followup echocardiogram in August. He does mild aortic insufficiency is noted on echocardiogram to evaluation.   He Myoview that showed a small apical anterolateral defect it was probably consistent with known occlusion of the diagonal filled by collaterals. This is been treated medically and he is not that symptomatic. 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 - no more hematuria.  No New studies. No recent hospitalizations.  Interval History: He presents today with no major complaints. He remains physically active, and feels quite well.  He is still busy doing yard work as well as routine exercise on almost daily basis. Currently dealing with URI/Cold Sx - a bit SOB & wheezing with it.  Otherwise doing well. No complaints.   He denies any resting or exertional chest tightness or pressure. No heart failure symptoms of PND, orthopnea or edema.he denies any back with irregular heartbeat/palpitations to suggest recurrence of A. Fib. Additionally no recurrence of any TIA or amaurosis fugax symptoms,or syncope/near syncope. He does have some dizziness episodes that are  oftentimes associated with turning his head a certain way or rolling over certain way. Sometimes related to orthostatic changes but usually just simply turning his head. This is somewhat exacerbated by his cold.  Past Medical History  Diagnosis Date  . CAD S/P percutaneous coronary angioplasty 1991; 10/2010    a) PCI - LAD in 1991; b) CATH 10/2010: Severe 2 Vessel disease = diffuse RCA ( 75% prox, 90% mid & 70 + 75% distal) & LAD (80-90% pre-stent, 75% post-stent) with occluded D1 (filled via OM-D1 collaterals)  -- CABG x 2; c) Myoview 4/'15: INTERMEDIATE RISK - small sized reversible apical/anterolateral defect -- most likely c/w known occlusion of D1 with collaterals.(med Rx)  . S/P CABG x 04 October 2010    LIMA-LAD, SVG to RPDA (Dr. [name])  . Postoperative atrial fibrillation [facility]) February 2012    after bypass-"shocked back in and no problems since"  . TIA (transient ischemic attack) few months ago  . Transient Dilated idiopathic cardiomyopathy April 2015; 04/2014    a) in setting of TIA: EF 35-40% (reduced from 55-65 percent) mild AI, moderate and dilated aorta.;; b) Recheck Echo 04/2014: EF 55-60%, Gr 1 DD, mild-mod AI.    [name] Kitchen Hyperlipidemia with target LDL less than 70     controlled.  . TGA (transient global amnesia)     "x3 for hours then goes away"  . Osteoporosis   . Tremors of nervous system     light in hands-no problems since  .  Arthritis     right shoulder   . Neuropathy of lower extremity   . Balance problem   . History of kidney stones 1959  . Urinary bladder cancer [facility]) Nov 2015    s/p resection in 07/2014 - Negative Cystoscopy Jan 2016   Stephen reviewed - pertinent data in [place].  No Known Allergies  Current Outpatient Prescriptions on File Prior to Visit  Medication Sig Dispense Refill  . atorvastatin (LIPITOR) 80 MG tablet Take 80 mg by mouth at bedtime.     . calcium-vitamin D (OSCAL WITH D) 500-200 MG-UNIT per tablet Take 1 tablet by mouth daily.     . clopidogrel  (PLAVIX) 75 MG tablet Take 1 tablet (75 mg total) by mouth daily. 90 tablet 2  . fish oil-omega-3 fatty acids 1000 MG capsule Take 1 g by mouth at bedtime.     . metoprolol tartrate (LOPRESSOR) 25 MG tablet TAKE 1/2 TABLET BY MOUTH TWICE DAILY 90 tablet 1  . Multiple Vitamin (MULTIVITAMIN) capsule Take 1 capsule by mouth at bedtime.     . Naproxen Sodium (ALEVE PO) Take 220 mg by mouth every morning.     No current facility-administered medications on file prior to visit.   Social History   Social History  . Marital Status: Married    Spouse Name: Stanton Kidney  . Number of Children: 0  . Years of Education: BS, MS   Occupational History  . Retired    Social History Main Topics  . Smoking status: Former Smoker -- 1.00 packs/day for 5 years    Types: Cigarettes    Quit date: 09/02/1957  . Smokeless tobacco: Never Used  . Alcohol Use: No  . Drug Use: No  . Sexual Activity: Not Asked   Other Topics Concern  . None   Social History Narrative   Pt lives at home with spouse.   Caffeine Use: very little   Family History  Problem Relation Age of Onset  . Heart Problems Mother   . Parkinson's disease Father   . Heart Problems Father     ROS: A comprehensive Review of Systems - was performed Review of Systems  Constitutional: Negative for malaise/fatigue.  HENT: Positive for congestion. Negative for nosebleeds.        Has a cold  Respiratory: Positive for cough. Negative for shortness of breath and wheezing.   Cardiovascular: Negative for claudication.  Gastrointestinal: Negative for blood in stool and melena.  Genitourinary: Positive for frequency (Nocturia x 2). Negative for dysuria and hematuria.  Musculoskeletal: Negative for myalgias.  Neurological: Positive for dizziness (postitional - when 1st gets up @ night). Negative for loss of consciousness.       Neuropathy - BLE; poor balance; no pain, just loss of sensation.   Endo/Heme/Allergies: Does not bruise/bleed easily.    Psychiatric/Behavioral: Negative.   All other systems reviewed and are negative.  Wt Readings from Last 3 Encounters:  10/26/15 160 lb 11.2 oz (72.893 kg)  10/16/15 157 lb (71.215 kg)  04/12/15 157 lb (71.215 kg)   PHYSICAL EXAM BP 134/68 mmHg  Pulse 69  Ht 5' 10.5" (1.791 m)  Wt 160 lb 11.2 oz (72.893 kg)  BMI 22.72 kg/m2 General appearance: alert, cooperative, appears stated age, no distress; pleasant mood and affect very healthy-appearing.  Neck: no adenopathy, no carotid bruit and no JVD Lungs: clear to auscultation bilaterally, normal percussion bilaterally and non-labored - fine late exp wheezing. Heart: regular rate and rhythm - w/ ectopy, S1&S2 normal,  Soft SEM 1/6 @ RUSB. No click, rub or gallop; nondisplaced PMI Abdomen: soft, non-tender; bowel sounds normal; no masses,  no organomegaly;  no bruits Extremities: extremities normal, atraumatic, no cyanosis, and trace edema. Pulses: 2+ and symmetric;  Neurologic: Mental status: Alert, oriented, thought content appropriate Cranial nerves: normal (II-XII grossly intact)   Adult ECG Report  Rate:  59 ;  Rhythm: sinus bradycardia, premature atrial contractions (PAC), premature ventricular contractions (PVC) and Nonspecific ST-T wave changes. Otherwise normal EKG Recent Labs  none availablel:  PCP checks lipids   ASSESSMENT / PLAN: Two-vessel CAD status post CABG x2;  His last Myoview showed small partially reversible defect that is consistent with his known diagonal occlusion. This was not thought to be amenable to PCI.  Thankfully he is doing very well with no active anginal symptoms. No symptoms to suggest recurrent ischemia or heart failure.  He remains on Plavix, Lipitor high-dose along with low-dose Lopressor.  Transient Congestive dilated cardiomyopathy EF is back to normal following CABG. No heart failure symptoms. Only able to tolerate low-dose Lopressor  AF (atrial fibrillation) -- Post Operative No recurrence  since his CABG. As result he remains only on Plavix and not full anticoagulation.  Dyslipidemia, goal LDL below 70 On high-dose atorvastatin. No myalgias or memory issues. Labs monitored by PCP  Presence of bare metal stent in LAD coronary artery: With significant stenosis on either end of the stent The only significance of this history is that the diagonal branch was jailed and now occluded.  Mild aortic insufficiency In the absence of symptoms, or worsening murmur. We can hold off on echo for now.   Orders Placed This Encounter  Procedures  . EKG 12-Lead   No orders of the defined types were placed in this encounter.    No change to medications. Followup:  6 months   HARDING, Leonie Green, M.D., M.S. Interventional Cardiologist   Pager # [phone] Phone # [phone] [address] [place], [place] [address]

## 2015-10-29 ENCOUNTER — Encounter: Payer: Self-pay | Admitting: Cardiology

## 2015-10-29 NOTE — Assessment & Plan Note (Signed)
His last Myoview showed small partially reversible defect that is consistent with his known diagonal occlusion. This was not thought to be amenable to PCI.  Thankfully he is doing very well with no active anginal symptoms. No symptoms to suggest recurrent ischemia or heart failure.  He remains on Plavix, Lipitor high-dose along with low-dose Lopressor.

## 2015-10-29 NOTE — Assessment & Plan Note (Signed)
No recurrence since his CABG. As result he remains only on Plavix and not full anticoagulation.

## 2015-10-29 NOTE — Assessment & Plan Note (Signed)
In the absence of symptoms, or worsening murmur. We can hold off on echo for now.

## 2015-10-29 NOTE — Assessment & Plan Note (Signed)
The only significance of this history is that the diagonal branch was jailed and now occluded.

## 2015-10-29 NOTE — Assessment & Plan Note (Signed)
EF is back to normal following CABG. No heart failure symptoms. Only able to tolerate low-dose Lopressor

## 2015-10-29 NOTE — Assessment & Plan Note (Signed)
On high-dose atorvastatin. No myalgias or memory issues. Labs monitored by PCP

## 2016-03-27 ENCOUNTER — Encounter: Payer: Self-pay | Admitting: Neurology

## 2016-04-01 ENCOUNTER — Other Ambulatory Visit: Payer: Self-pay | Admitting: *Deleted

## 2016-04-01 MED ORDER — CLOPIDOGREL BISULFATE 75 MG PO TABS: 75.0000 mg | ORAL_TABLET | Freq: Every day | ORAL | 1 refills | Status: DC

## 2016-04-15 ENCOUNTER — Ambulatory Visit: Payer: Medicare Other | Admitting: Neurology

## 2016-05-08 ENCOUNTER — Ambulatory Visit (INDEPENDENT_AMBULATORY_CARE_PROVIDER_SITE_OTHER): Payer: Medicare Other | Admitting: Neurology

## 2016-05-08 ENCOUNTER — Encounter: Payer: Self-pay | Admitting: Neurology

## 2016-05-08 VITALS — BP 122/70 | HR 62 | Resp 16 | Ht 70.5 in | Wt 157.0 lb

## 2016-05-08 DIAGNOSIS — G629 Polyneuropathy, unspecified: Secondary | ICD-10-CM | POA: Diagnosis not present

## 2016-05-08 DIAGNOSIS — R278 Other lack of coordination: Secondary | ICD-10-CM

## 2016-05-08 DIAGNOSIS — R269 Unspecified abnormalities of gait and mobility: Secondary | ICD-10-CM | POA: Diagnosis not present

## 2016-05-08 NOTE — Progress Notes (Signed)
Subjective:    [name] ID: Tanner [name] is Tanner 82 y.o. [name].  HPI     Interim history:   Tanner [name] is Tanner very pleasant 82 year old right-handed gentleman with an underlying medical history of heart disease, s/p angioplasty in 1991 and then 2 vessel CABG in 2012, hyperlipidemia, previous Tanner fib after CABG, s/p cardioversion, osteoporosis, osteoarthritis and BPH, who presents for followup consultation of his gait disorder, neuropathy and sensory ataxia. Tanner [name] is unaccompanied today. I last saw Tanner [name] on 10/16/2015, at which time Tanner [name] felt stable, numbness was unchanged, had no pain, no tingling, was using his cane, was still driving, mostly local roads and under Tanner speed limit, typically not after dark. I suggested Tanner 6 month follow-up, we've made no medication changes at Tanner time and exam was stable.   Today, 05/08/2016: Tanner [name] reports feeling Tanner same, no pain, no falls. No new issues. Has not been using Tanner cane though. Has been married 5 years this June! Tanner [name] occasionally take B vitamin supplements. 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 Is on Plavix, no ASA, bruises easily. 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   Previously:    I saw Tanner [name] on 04/12/2015, at which time Tanner [name] reported doing about Tanner same. Tanner [name] was not using Tanner cane. Tanner [name] had undergone 4 treatments of acupuncture and had started Tanner vitamin B supplement. Tanner [name] did not notice any significant difference in his balance or walking. Tanner [name] had had no recent falls. His exam was stable.    I saw Tanner [name] on 09/26/2014, at which time Tanner [name] reported doing Tanner same. Tanner [name] had not fallen, Tanner [name] was not using his cane. Tanner [name] was active, was still driving. Tanner [name] reported bladder cancer surgery on 07/12/2014. Tanner [name] is followed by urologist, Dr. [name]. I suggested Tanner [name] start using Tanner cane for gait safety. I asked Tanner [name] to ensure to be better hydrated.   I saw  Tanner [name] on 03/25/15 at which time Tanner [name] felt stable, no falls. Tanner [name] was not using his cane.    I saw Tanner [name] on 06/21/2013, at which time Tanner [name] had physical examination findings consistent with neuropathy and mild sensory ataxia and I felt his exam was stable. We checked heavy metals at Tanner time which were unremarkable. Tanner [name] was reminded not to climb ladders or be at heights and stay well-hydrated and not to get overheated. In Tanner interim, on 12/09/2013 Tanner [name] presented to Tanner emergency room with new onset poor balance, word finding difficulties, confusion and dizziness. Tanner [name] was working on Tanner rental house fixing Tanner light fixture when Tanner [name] began to feel this way and overall symptoms lasted about 6 hours. Previously Tanner [name] has Tanner history of transient global amnesia. Neurology was consulted and felt that it was more likely Tanner TIA and Tanner [name] therefore Tanner [name] was admitted for TIA workup. Tanner [name] was switched to Plavix. Carotid Doppler studies were done as well as echocardiogram and CT head an MRI and MRA. I reviewed all Tanner hospital test results. Carotid Doppler test results from 12/10/2013 showed: Bilateral: 1-39% ICA stenosis. Echocardiogram from 12/10/2013 showed: Tanner [name] was 35-40, mild aortic regurgitation, moderate aortic dilatation, mild mitral valve regurgitation, right atrium was mildly to moderately dilated. Cardiology was consulted. CT head from 12/09/2013 without contrast showed: Atrophy with periventricular small vessel disease. No intracranial mass, hemorrhage, or acute appearing infarct. MRI and MRA brain from  12/10/2013 showed: No acute intracranial infarct or other abnormality identified. Small remote right frontal lobe infarct as above. Mild atrophy with moderate chronic microvascular ischemic disease. Normal MRA of Tanner brain without evidence of high-grade flow-limiting stenosis, proximal branch occlusion, or other acute abnormality. No intracranial aneurysm.   Tanner [name] saw my colleague Dr. [name] on 01/20/2014 for TIA followup from Tanner admission. Tanner [name] was  advised to continue with Plavix. Tanner [name] was advised regarding secondary stroke prevention and lipid control.   I first met Tanner [name] on 03/11/2013, which time I suggested further workup in Tanner form of blood work and EMG and nerve conduction testing. Tanner [name] reported Tanner one-year history of lower extremity numbness. Tanner [name] has no Hx of EtOH abuse. Tanner [name] helps build houses. Tanner [name] does not use Tanner cane or walker. Tanner [name] denies back pain. Tanner [name] has had no exposures to toxins.   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 Tanner [name] and his wife live at [facility]; they have been married 64 years and they have no children.   Tanner [name] reported no tingling, no burning, no pain, no tenderness, but occasional cramping. His balance is not as good, especially with his eyes closed. Tanner [name] describes no claudication.   His blood work showed for Tanner most part normal findings with Tanner exception of Tanner borderline elevated CK-MB. I tested ANA, ESR, B12, B1, vitamin D, RPR, SPEP, immunofluorescent electrophoresis Tanner [name] Tanner TSH, CRP, CMP, all of which were normal. His EMG and nerve conduction velocity testing from 03/26/2013 was reviewed: Nerve conduction studies done on Tanner right upper extremity and both lower extremities revealed evidence of Tanner primarily axonal peripheral neuropathy of moderate to severe severity. EMG evaluation of Tanner right lower extremity shows distal denervation that is acute and chronic consistent with Tanner diagnosis of peripheral neuropathy. There is mild acute denervation in Tanner lumbosacral paraspinal muscles, and Tanner mild overlying lumbosacral radiculopathy of indeterminate level is suspected. Clinical correlation is required.    His Past Medical History Is Significant For: Past Medical History:  Diagnosis Date  . Arthritis    right shoulder   . Balance problem   . CAD S/P percutaneous coronary angioplasty 1991; 10/2010   Tanner) PCI - LAD  in 1991; b) CATH 10/2010: Severe 2 Vessel disease = diffuse RCA ( 75% prox, 90% mid & 70 + 75% distal) & LAD (80-90% pre-stent, 75% post-stent) with occluded D1 (filled via OM-D1 collaterals)  -- CABG x 2; c) Myoview 4/'15: INTERMEDIATE RISK - small sized reversible apical/anterolateral defect -- most likely c/w known occlusion of D1 with collaterals.(med Rx)  . History of kidney stones 1959  . Hyperlipidemia with target LDL less than 70    controlled.  . Neuropathy of lower extremity   . Osteoporosis   . Postoperative atrial fibrillation [facility]) February 2012   after bypass-"shocked back in and no problems since"  . S/P CABG x 04 October 2010   LIMA-LAD, SVG to RPDA (Dr. [name])  . TGA (transient global amnesia)    "x3 for hours then goes away"  . TIA (transient ischemic attack) few months ago  . Transient Dilated idiopathic cardiomyopathy April 2015; 04/2014   Tanner) in setting of TIA: EF 35-40% (reduced from 55-65 percent) mild AI, moderate and dilated aorta.;; b) Recheck Echo 04/2014: EF 55-60%, Gr 1 DD, mild-mod AI.    [name] Kitchen Tremors of nervous system  light in hands-no problems since  . Urinary bladder cancer [facility]) Nov 2015   s/p resection in 07/2014 - Negative Cystoscopy Jan 2016    His Past Surgical History Is Significant For: Past Surgical History:  Procedure Laterality Date  . APPENDECTOMY  as teen  . BASAL CELL CARCINOMA EXCISION  07/2014   "goes every 6 months"  . CARDIAC CATHETERIZATION  10/17/2010   80%prox w/mild in-stent restenosis w/75% mid-LADstenosis aft Tanner stent,multi severe stenoses RCAw/heavily calcified vessel,norm LV  . CARDIOVERSION  2012   after bypass surgery  . CATARACT EXTRACTION Bilateral ~10 years ago  . COCCYX REMOVAL    . CORONARY ARTERY BYPASS GRAFT  10/2010   LIMA-LAD, SVG-rPDA  . CYSTOSCOPY W/ RETROGRADES N/Tanner 07/12/2014   Procedure: CYSTOSCOPY WITH RETROGRADE PYELOGRAM;  Surgeon: [name], MD;  Location: [facility];  Service: Urology;  Laterality: N/Tanner;   . CYSTOSCOPY WITH BIOPSY N/Tanner 09/09/2014   Procedure: CYSTO WITH BIOPSY AND FULGERATION, DILATION OF URETHRAL STRICTURE;  Surgeon: [name], MD;  Location: [facility];  Service: Urology;  Laterality: N/Tanner;  BLADDER BIOPSY  . CYSTOSCOPY WITH BIOPSY N/Tanner 01/03/2015   Procedure: CYSTOSCOPY WITH BIOPSY;  Surgeon: [name], MD;  Location: [facility];  Service: Urology;  Laterality: N/Tanner;  . CYSTOSCOPY WITH URETHRAL DILATATION N/Tanner 01/03/2015   Procedure: CYSTOSCOPY WITH URETHRAL DILATATION;  Surgeon: [name], MD;  Location: [facility];  Service: Urology;  Laterality: N/Tanner;  . DOPPLER ECHOCARDIOGRAPHY  10/18/2004   EF 55-65%,BORDERLINE -enlarged aortic root,mild aortic insuff.,trace tricus.insuff.  . heart stent placement  10-18-10   2 vessel  . [place]  . NM MYOCAR PERF WALL MOTION  07/24/1998   EF 71%  . NM MYOVIEW LTD  12/16/2013   INTERMEDIATE RISK test with small sized reversible perfusion defect in Tanner apical anterolateral wall -- most likely consistent with known occlusion of D1 with collaterals.  . shoulder surgery Right 1988  . TONSILLECTOMY  as teen  . TRANSTHORACIC ECHOCARDIOGRAM  April 2015   EF 35-40%, mild AI, moderately dilated ascending aorta  . TRANSURETHRAL RESECTION OF PROSTATE N/Tanner 07/12/2014   Procedure: TRANSURETHRAL RESECTION OF Tanner PROSTATE (TURP) WITH MITOMYCIN -C;  Surgeon: [name], MD;  Location: [facility];  Service: Urology;  Laterality: N/Tanner;    His Family History Is Significant For: Family History  Problem Relation Age of Onset  . Heart Problems Mother   . Parkinson's disease Father   . Heart Problems Father     His Social History Is Significant For: Social History   Social History  . Marital status: Married    Spouse name: Mary  . Number of children: 0  . Years of education: BS, MS   Occupational History  . Retired    Social History Main Topics  . Smoking status: Former Smoker    Packs/day: 1.00    Years: 5.00    Types:  Cigarettes    Quit date: 09/02/1957  . Smokeless tobacco: Never Used  . Alcohol use No  . Drug use: No  . Sexual activity: Not Asked   Other Topics Concern  . None   Social History Narrative   Pt lives at home with spouse.   Caffeine Use: very little    His Allergies Are:  No Known Allergies:   His Current Medications Are:  Outpatient Encounter Prescriptions as of 05/08/2016  Medication Sig  . atorvastatin (LIPITOR) 80 MG tablet Take 80 mg by mouth at bedtime.   . calcium-vitamin D (  OSCAL WITH D) 500-200 MG-UNIT per tablet Take 1 tablet by mouth daily.   . clopidogrel (PLAVIX) 75 MG tablet Take 1 tablet (75 mg total) by mouth daily.  . fish oil-omega-3 fatty acids 1000 MG capsule Take 1 g by mouth at bedtime.   . metoprolol tartrate (LOPRESSOR) 25 MG tablet TAKE 1/2 TABLET BY MOUTH TWICE DAILY  . Multiple Vitamin (MULTIVITAMIN) capsule Take 1 capsule by mouth at bedtime.   . Naproxen Sodium (ALEVE PO) Take 220 mg by mouth every morning.   No facility-administered encounter medications on file as of 05/08/2016.   :  Review of Systems:  Out of Tanner complete 14 point review of systems, all are reviewed and negative with Tanner exception of these symptoms as listed below:  Review of Systems  Neurological:       [name] reports that Tanner [name] is Tanner same as last visit. Has bilateral leg numbness. States that Tanner [name] does not use Tanner cane. Reports being "Tanner little dizzy" in Tanner morning but Tanner [name] sits on Tanner side of Tanner bed for 60mn before getting up. No new concerns.     Objective:  Neurologic Exam  Physical Exam Physical Examination:   Vitals:   05/08/16 0822  BP: 122/70  Pulse: 62  Resp: 16   General Examination: Tanner [name] is Tanner very pleasant 82y.o. [name] in no acute distress. Tanner [name] appears well-developed and well-nourished and well groomed. Tanner [name] is in good spirits today, as is his usual.  HEENT: Normocephalic, atraumatic. Pupils are equal, round and reactive to light and accommodation. Tanner [name] is s/p  cataract repairs b/l. Extraocular tracking is good without limitation to gaze excursion or nystagmus noted. Normal smooth pursuit is noted. Hearing is grossly intact. Face is symmetric with normal facial animation and normal facial sensation. Speech is clear with no dysarthria noted. There is no hypophonia. There is no lip, neck/head, jaw or voice tremor. Neck is supple with full range of passive and active motion. There are no carotid bruits on auscultation. Oropharynx exam reveals: mild mouth dryness, adequate dental hygiene and mild airway crowding, due to redundant soft palate. Mallampati is class II. Tongue protrudes centrally and palate elevates symmetrically.   Chest: Clear to auscultation without wheezing, rhonchi or crackles noted.  Heart: S1+S2+0, very slightly irregular without murmurs, rubs or gallops noted.   Abdomen: Soft, non-tender and non-distended with normal bowel sounds appreciated on auscultation.  Extremities: There is no pitting edema around both ankles. Tanner [name] was wearing long knee-high socks and his feet are slightly pale and colder, unchanged, his pedal pulses are faintly palpable.   Skin: Warm and dry without trophic changes noted. There are no varicose veins, except L leg some, but Tanner [name] does have some spider veins. Tanner [name] has some bruising on his forearms and hands. Age-related changes are noted on his arms and legs. Tanner [name] has some scaly dry patches on his scalp.  Musculoskeletal: exam reveals no obvious joint deformities, tenderness or joint swelling or erythema.   Neurologically:  Mental status: Tanner [name] is awake, alert and oriented in all 4 spheres. His memory, attention, language and knowledge are appropriate. There is no aphasia, agnosia, apraxia or anomia. Speech is clear with normal prosody and enunciation. Thought process is linear. Mood is congruent and affect is normal. Tanner [name] is very pleasant and conversant. Cranial nerves are as described above under HEENT exam. In addition,  shoulder shrug is normal with equal shoulder height noted. Motor exam: Normal bulk, strength and tone is noted. There is  no drift, tremor or rebound. Romberg is positive. Reflexes are 1+ in Tanner upper extremities, 1+ in both knees and absent in both ankles. Fine motor skills are intact with normal finger taps, normal hand movements, normal rapid alternating patting, normal foot taps and normal foot agility.  Cerebellar testing shows no dysmetria or intention tremor in Tanner UE or LEs.   Sensory exam is stable with decreased to all modalities in Tanner distal lower extremities, up to mid shin area to below knees b/l, right side slightly little better, unchanged. Gait, station and balance: Tanner [name] stands up with no significant difficulty but has to push up, stance is mildly wide-based. Tanner [name] walks slightly cautiously and mildly wide-based. Tanner [name] turns well. Tandem walk is not possible and balance is mildly impaired, all unchanged from last time. No cane with Tanner [name] today.  Assessment and Plan:   In summary, Tanner [name] is Tanner very pleasant 82 year old [name] with an underlying medical history of heart disease, s/p angioplasty in 1991 and then 2 vessel CABG in 2012, hyperlipidemia, previous Tanner fib after CABG, s/p cardioversion, osteoporosis, osteoarthritis and BPH, who presents for followup consultation of his gait disorder, neuropathy and sensory ataxia. Tanner [name] has Tanner fairly stable exam, which is reassuring. Tanner [name] may have had Tanner TIA in April 2015, but is doing well, on Plavix without complications and no recent TIA-type Sx. His EMG and nerve conduction testing in Tanner past in 2014 confirmed peripheral neuropathy, but Tanner [name] has been stable by symptoms and also fairly stable by exam. We checked heavy metals, which were fine. Tanner [name] has no pain. We checked labs in Tanner past in 2014, all non-revealing.  I again discussed with Tanner [name] Tanner diagnosis of gait d/o, PN, Tanner prognosis and treatment options. We talked about medical treatments and  non-pharmacological approaches. We talked about trying to maintain Tanner healthy lifestyle in general and we again talked about secondary stroke prevention and risk for dehydration. I encouraged Tanner [name] to eat healthy, exercise daily and keep well hydrated, to keep Tanner scheduled bedtime and wake time routine, to not skip any meals and eat healthy snacks in between meals and to have protein with every meal. I also warned Tanner [name] again about getting overheated and dehydrated and would like for Tanner [name] to be extra cautious and not to overdo it physically. I would like for Tanner [name] to use his cane as needed for safety, but Tanner [name] has been reluctant (for years!). Tanner [name] is again advised against climbing ladders or be at heights. Tanner [name] does use Tanner step ladder from time to time, unfortunately. Tanner [name] has been cautious with his driving. Tanner [name] has been very reasonable and appears to have very good judgment and insight. I'm impressed and so pleased to see that Tanner [name] continues to take very good care of himself, mentally also still very sharp.  Tanner [name] is advised to followup with me as needed at this juncture. I answered all his questions today and Tanner [name] was in agreement.  I spent 25 minutes in total face-to-face time with Tanner [name], more than 50% of which was spent in counseling and coordination of care, reviewing test results, reviewing medication and discussing or reviewing Tanner diagnosis of gait d/o, PN, Tanner prognosis and treatment options.

## 2016-05-08 NOTE — Patient Instructions (Addendum)
Your symptoms and you exam have been fairly stable. Therefore, I think we can do as needed follow up.  Try to stay active, use a cane for safety. You are at fall risk due to neuropathy and age.  Try to stay well hydrated with water.  It was good to see you again! Please take good care of yourself!

## 2016-05-13 ENCOUNTER — Encounter: Payer: Self-pay | Admitting: Cardiology

## 2016-05-13 ENCOUNTER — Ambulatory Visit (INDEPENDENT_AMBULATORY_CARE_PROVIDER_SITE_OTHER): Payer: Medicare Other | Admitting: Cardiology

## 2016-05-13 VITALS — BP 118/70 | HR 55 | Ht 70.0 in | Wt 156.0 lb

## 2016-05-13 DIAGNOSIS — I351 Nonrheumatic aortic (valve) insufficiency: Secondary | ICD-10-CM

## 2016-05-13 DIAGNOSIS — I2510 Atherosclerotic heart disease of native coronary artery without angina pectoris: Secondary | ICD-10-CM

## 2016-05-13 DIAGNOSIS — I480 Paroxysmal atrial fibrillation: Secondary | ICD-10-CM

## 2016-05-13 DIAGNOSIS — Z955 Presence of coronary angioplasty implant and graft: Secondary | ICD-10-CM

## 2016-05-13 DIAGNOSIS — I420 Dilated cardiomyopathy: Secondary | ICD-10-CM

## 2016-05-13 DIAGNOSIS — E785 Hyperlipidemia, unspecified: Secondary | ICD-10-CM

## 2016-05-13 NOTE — Patient Instructions (Signed)
Continue same medications.   Your physician wants you to follow-up in: 6 months.  You will receive a reminder letter in the mail two months in advance. If you don't receive a letter, please call our office to schedule the follow-up appointment.  

## 2016-05-13 NOTE — Assessment & Plan Note (Signed)
EF return to normal following his CABG. No other heart failure symptoms. On stable low-dose beta blocker.

## 2016-05-13 NOTE — Progress Notes (Signed)
PCP: Thressa Sheller, MD  Clinic Note: Chief Complaint  Patient presents with  . Coronary Artery Disease    Follow-up    HPI: Tanner [name] is a 82 y.o. male with a history of CAD initially in 1991 he had an LAD stent placed.   recurrent class IV angina in February 2012 --> Cath = diffuse RCA disease as well as LAD disease --> referred for 2 vessel CABG.   He had transient postoperative A. Fib at that time but no recurrences after cardioversion.   In April 2015  he had a TIA and was noted to have moderate to severe reduction in his EF.   This proved to be transient with an improved EF on followup echocardiogram in August. He does mild aortic insufficiency is noted on echocardiogram to evaluation.   He Myoview that showed a small apical anterolateral defect it was probably consistent with known occlusion of the diagonal filled by collaterals. This is been treated medically and he is not that symptomatic. 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   No New studies. No recent hospitalizations.  Interval History: He presents today doing quite well without any major complaints. He remains physically active, and feels quite well.  He is still busy doing yard work as well as routine exercise on almost daily basis. 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  He denies any resting or exertional chest tightness or pressure. No heart failure symptoms of PND, orthopnea or edema.he denies any back with irregular heartbeat/palpitations to suggest recurrence of A. Fib. Additionally no recurrence of any TIA or amaurosis fugax symptoms,or syncope/near syncope. He does have some dizziness episodes that are oftentimes associated with turning his head a certain way or rolling over certain way. Sometimes related to orthostatic changes but  usually just simply turning his head. This is somewhat exacerbated by his cold.  Past Medical History:  Diagnosis Date  . Arthritis    right shoulder   . Balance problem   . CAD S/P percutaneous coronary angioplasty 1991; 10/2010   a) PCI - LAD in 1991; b) CATH 10/2010: Severe 2 Vessel disease = diffuse RCA ( 75% prox, 90% mid & 70 + 75% distal) & LAD (80-90% pre-stent, 75% post-stent) with occluded D1 (filled via OM-D1 collaterals)  -- CABG x 2; c) Myoview 4/'15: INTERMEDIATE RISK - small sized reversible apical/anterolateral defect -- most likely c/w known occlusion of D1 with collaterals.(med Rx)  . History of kidney stones 1959  . Hyperlipidemia with target LDL less than 70    controlled.  . Neuropathy of lower extremity   . Osteoporosis   . Postoperative atrial fibrillation [facility]) February 2012   after bypass-"shocked back in and no problems since"  . S/P CABG x 04 October 2010   LIMA-LAD, SVG to RPDA (Dr. [name])  . TGA (transient global amnesia)    "x3 for hours then goes away"  . TIA (transient ischemic attack) few months ago  . 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    [name] Kitchen Tremors of nervous system    light in hands-no problems since  . Urinary bladder cancer [facility]) Nov 2015  s/p resection in 07/2014 - Negative Cystoscopy Jan 2016   [place] reviewed - pertinent data in [place].  No Known Allergies  Current Outpatient Prescriptions on File Prior to Visit  Medication Sig Dispense Refill  . atorvastatin (LIPITOR) 80 MG tablet Take 80 mg by mouth at bedtime.     . calcium-vitamin D (OSCAL WITH D) 500-200 MG-UNIT per tablet Take 1 tablet by mouth daily.     . clopidogrel (PLAVIX) 75 MG tablet Take 1 tablet (75 mg total) by mouth daily. 90 tablet 1  . fish oil-omega-3 fatty acids 1000 MG capsule Take 1 g by  mouth at bedtime.     . metoprolol tartrate (LOPRESSOR) 25 MG tablet TAKE 1/2 TABLET BY MOUTH TWICE DAILY 90 tablet 1  . Multiple Vitamin (MULTIVITAMIN) capsule Take 1 capsule by mouth at bedtime.     . Naproxen Sodium (ALEVE PO) Take 220 mg by mouth every morning.     No current facility-administered medications on file prior to visit.    Social History   Social History  . Marital status: Married    Spouse name: Mary  . Number of children: 0  . Years of education: BS, MS   Occupational History  . Retired    Social History Main Topics  . Smoking status: Former Smoker    Packs/day: 1.00    Years: 5.00    Types: Cigarettes    Quit date: 09/02/1957  . Smokeless tobacco: Never Used  . Alcohol use No  . Drug use: No  . Sexual activity: Not Asked   Other Topics Concern  . None   Social History Narrative   Pt lives at home with spouse.   Caffeine Use: very little   Family History  Problem Relation Age of Onset  . Heart Problems Mother   . Parkinson's disease Father   . Heart Problems Father     ROS: A comprehensive Review of Systems - was performed Review of Systems  Constitutional: Negative for chills, fever and malaise/fatigue.  HENT: Negative for congestion and nosebleeds.        Has a cold  Respiratory: Negative for cough, shortness of breath and wheezing.   Cardiovascular: Negative for claudication.  Gastrointestinal: Negative for blood in stool and melena.  Genitourinary: Positive for frequency (Nocturia x 2). Negative for dysuria and hematuria.  Musculoskeletal: Negative for myalgias.  Neurological: Positive for dizziness (postitional - when 1st gets up @ night). Negative for loss of consciousness.       Neuropathy - BLE; poor balance; no pain, just loss of sensation.   Endo/Heme/Allergies: Does not bruise/bleed easily.  Psychiatric/Behavioral: Negative.  Negative for depression and memory loss. The patient is not nervous/anxious and does not have insomnia.     All other systems reviewed and are negative.   Wt Readings from Last 3 Encounters:  05/13/16 156 lb (70.8 kg)  05/08/16 157 lb (71.2 kg)  10/26/15 160 lb 11.2 oz (72.9 kg)   PHYSICAL EXAM BP 118/70 (BP Location: Right Arm, Patient Position: Sitting, Cuff Size: Normal)   Pulse (!) 55   Ht 5\' 10"  (1.778 m)   Wt 156 lb (70.8 kg)   SpO2 96%   BMI 22.38 kg/m  General appearance: alert, cooperative, appears stated age, no distress; pleasant mood and affect very healthy-appearing.  Neck: no adenopathy, no carotid bruit and no JVD Lungs: clear to auscultation bilaterally, normal percussion bilaterally and non-labored - fine late exp wheezing. Heart: regular rate and rhythm - w/ ectopy,  S1&S2 normal, Soft SEM 1/6 @ RUSB as well as soft barely audible diastolic murmur.. No click, rub or gallop; nondisplaced PMI Abdomen: soft, non-tender; bowel sounds normal; no masses,  no organomegaly;  no bruits Extremities: extremities normal, atraumatic, no cyanosis, and trace edema. Pulses: 2+ and symmetric;  Neurologic: Mental status: Alert, oriented, thought content appropriate Cranial nerves: normal (II-XII grossly intact)   Adult ECG Report  Rate:  55;  Rhythm: sinus bradycardia, premature atrial contractions (PAC) and 1st Deg AVB.  Nonspecific ST-T wave changes. Otherwise normal EKG - stable EKG  Recent Labs  none availablel:  PCP checks lipids - due in a few days   ASSESSMENT / PLAN:  Two-vessel CAD status post CABG x2;  Doing well with no active symptoms. Last Myoview showed a potential reversible defect that is probably consistent with known diagonal occlusion. In the absence of symptoms, I would not pursue any invasive evaluation. Remains on high-dose atorvastatin, low-dose beta blocker and Plavix.  Transient Congestive dilated cardiomyopathy EF return to normal following his CABG. No other heart failure symptoms. On stable low-dose beta blocker.  Presence of bare metal stent in LAD  coronary artery: With significant stenosis on either end of the stent He has an occluded, jailed diagonal branch that is not PCI alternative. This is what showed up on stress test. The LAD is otherwise bypassed.  Dyslipidemia, goal LDL below 70 Tolerating high-dose atorvastatin with no myalgias or memory issues. Labs monitored by PCP. He is due to get objects and  AF (atrial fibrillation) -- Post Operative No documented recurrence since CABG. Remains on Plavix. With no recurrence, he is not on full anti--coagulation.  Mild aortic insufficiency Stable soft murmur. Can hold off echocardiogram for now.  No orders of the defined types were placed in this encounter.  No orders of the defined types were placed in this encounter.   No change to medications. Followup:  6 months   Glenetta Hew, M.D., M.S. Interventional Cardiologist   Pager # [phone] Phone # [phone] [address] [place], [place] [address]

## 2016-05-13 NOTE — Assessment & Plan Note (Signed)
Doing well with no active symptoms. Last Myoview showed a potential reversible defect that is probably consistent with known diagonal occlusion. In the absence of symptoms, I would not pursue any invasive evaluation. Remains on high-dose atorvastatin, low-dose beta blocker and Plavix.

## 2016-05-13 NOTE — Assessment & Plan Note (Signed)
He has an occluded, jailed diagonal branch that is not PCI alternative. This is what showed up on stress test. The LAD is otherwise bypassed.

## 2016-05-13 NOTE — Assessment & Plan Note (Signed)
Stable soft murmur. Can hold off echocardiogram for now.

## 2016-05-13 NOTE — Assessment & Plan Note (Signed)
Tolerating high-dose atorvastatin with no myalgias or memory issues. Labs monitored by PCP. He is due to get objects and

## 2016-05-13 NOTE — Assessment & Plan Note (Addendum)
No documented recurrence since CABG. Remains on Plavix. With no recurrence, he is not on full anti--coagulation.

## 2016-10-10 ENCOUNTER — Other Ambulatory Visit: Payer: Self-pay | Admitting: Cardiology

## 2016-11-11 ENCOUNTER — Ambulatory Visit (INDEPENDENT_AMBULATORY_CARE_PROVIDER_SITE_OTHER): Payer: Medicare Other | Admitting: Cardiology

## 2016-11-11 ENCOUNTER — Encounter: Payer: Self-pay | Admitting: Cardiology

## 2016-11-11 VITALS — BP 136/68 | HR 58 | Ht 70.75 in | Wt 160.0 lb

## 2016-11-11 DIAGNOSIS — E785 Hyperlipidemia, unspecified: Secondary | ICD-10-CM

## 2016-11-11 DIAGNOSIS — I480 Paroxysmal atrial fibrillation: Secondary | ICD-10-CM

## 2016-11-11 DIAGNOSIS — I420 Dilated cardiomyopathy: Secondary | ICD-10-CM

## 2016-11-11 DIAGNOSIS — G458 Other transient cerebral ischemic attacks and related syndromes: Secondary | ICD-10-CM | POA: Diagnosis not present

## 2016-11-11 DIAGNOSIS — I2510 Atherosclerotic heart disease of native coronary artery without angina pectoris: Secondary | ICD-10-CM | POA: Diagnosis not present

## 2016-11-11 MED ORDER — CLOPIDOGREL BISULFATE 75 MG PO TABS: 75.0000 mg | ORAL_TABLET | Freq: Every day | ORAL | 3 refills | Status: DC

## 2016-11-11 MED ORDER — METOPROLOL TARTRATE 25 MG PO TABS: 12.5000 mg | ORAL_TABLET | Freq: Two times a day (BID) | ORAL | 3 refills | Status: DC

## 2016-11-11 NOTE — Assessment & Plan Note (Signed)
On high dose statin - labs monitored by PCP - stable.  No myalgias

## 2016-11-11 NOTE — Assessment & Plan Note (Addendum)
Doing well.  No Sx of angina or CHF in a very active/vibrant 83 year old-- on BB & statin, Plavix. Re-look Myoview in 2019

## 2016-11-11 NOTE — Patient Instructions (Signed)
NO CHANGE WITH CURRENT MEDICATIONS   Your physician wants you to follow-up in 6 MONTHS WITH [name].  You will receive a reminder letter in the mail two months in advance. If you don't receive a letter, please call our office to schedule the follow-up appointment.  If you need a refill on your cardiac medications before your next appointment, please call your pharmacy.   

## 2016-11-11 NOTE — Progress Notes (Signed)
PCP: Thressa Sheller, MD  Clinic Note: Chief Complaint  Patient presents with  . Follow-up    No chest pain, shortness of breath, edema, pain or cramping in legs, lightheaded or dizziness; has pain in legs from neuropathy in legs-nervous about driveing   . Coronary Artery Disease    HPI: Tanner [name] is a 83 y.o. male with a history of CAD initially in 1991 he had an LAD stent placed.   Recurrent class IV angina in February 2012 --> Cath = diffuse RCA disease as well as LAD disease (significant ISR of LAD BMS) --> referred for 2 vessel CABG.   He had transient postoperative A. Fib at that time but no recurrences after cardioversion.   In April 2015  he had a TIA and was noted to have moderate to severe reduction in his EF.   This proved to be transient with an improved EF on followup echocardiogram in August. He does mild aortic insufficiency is noted on echocardiogram to evaluation.   He Myoview that showed a small apical anterolateral defect it was probably consistent with known occlusion of the diagonal filled by collaterals. This is been treated medically and he is not that symptomatic. 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  No New studies. No recent hospitalizations.  Last seen - Sept 2017: was doing quite well without any major complaints.  Still very active & does yard work + routine exercise. 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 He has not had any symptoms to speak of to suggest recurrence of angina or heart failure. No further episodes of A. fib suggesting that this was simply perioperative/procedural. Could very well been  related to his CAD as there is been no recurrence. At present he would prefer not to be on anticoagulation other than Plavix..    he still does exercise 6 days a week for at least 30 minutes if not more.   Cardiac Review of Symptoms: He denies any resting or exertional chest tightness or pressure.  No heart failure symptoms of PND, orthopnea or edema.. No significant palpitations or rapid irregular heartbeats to suggest A. fib. No nce of any TIA or amaurosis fugax symptoms,or syncope/near syncope. He does have some dizziness episodes that are oftentimes associated with turning his head a certain way or rolling over certain way. Sometimes related to orthostatic changes but usually just simply turning his head. This is somewhat exacerbated by his cold.  Past Medical History:  Diagnosis Date  . Arthritis    right shoulder   . Balance problem   . CAD S/P percutaneous coronary angioplasty 1991; 10/2010   a) PCI - LAD in 1991; b) CATH 10/2010: Severe 2 Vessel disease = diffuse RCA ( 75% prox, 90% mid & 70 + 75% distal) & LAD (80-90% pre-stent, 75% post-stent) with occluded D1 (filled via OM-D1 collaterals)  -- CABG x 2; c) Myoview 4/'15: INTERMEDIATE RISK - small sized reversible apical/anterolateral defect -- most likely c/w known occlusion of D1 with collaterals.(med Rx)  . History of kidney stones 1959  . Hyperlipidemia with target LDL less than 70    controlled.  . Neuropathy of lower extremity   .  Osteoporosis   . Postoperative atrial fibrillation [facility]) February 2012   after bypass-"shocked back in and no problems since"  . S/P CABG x 04 October 2010   LIMA-LAD, SVG to RPDA (Dr. [name])  . TGA (transient global amnesia)    "x3 for hours then goes away"  . TIA (transient ischemic attack) few months ago  . Transient Dilated idiopathic cardiomyopathy April 2015; 04/2014   a) in setting of TIA: EF 35-40% (reduced from 55-65 percent) mild AI, moderate and dilated aorta.;; b) Recheck Echo 04/2014: EF  55-60%, Gr 1 DD, mild-mod AI.    [name] Kitchen Tremors of nervous system    light in hands-no problems since  . Urinary bladder cancer [facility]) Nov 2015   s/p resection in 07/2014 - Negative Cystoscopy Jan 2016   [place] reviewed - pertinent data in [place].  No Known Allergies  Current Outpatient Prescriptions on File Prior to Visit  Medication Sig Dispense Refill  . atorvastatin (LIPITOR) 80 MG tablet Take 80 mg by mouth at bedtime.     . calcium-vitamin D (OSCAL WITH D) 500-200 MG-UNIT per tablet Take 1 tablet by mouth daily.     . fish oil-omega-3 fatty acids 1000 MG capsule Take 1 g by mouth at bedtime.     . Multiple Vitamin (MULTIVITAMIN) capsule Take 1 capsule by mouth at bedtime.     . Naproxen Sodium (ALEVE PO) Take 220 mg by mouth every morning.     No current facility-administered medications on file prior to visit.    Social History   Social History  . Marital status: Married    Spouse name: Mary  . Number of children: 0  . Years of education: BS, MS   Occupational History  . Retired    Social History Main Topics  . Smoking status: Former Smoker    Packs/day: 1.00    Years: 5.00    Types: Cigarettes    Quit date: 09/02/1957  . Smokeless tobacco: Never Used  . Alcohol use No  . Drug use: No  . Sexual activity: Not Asked   Other Topics Concern  . None   Social History Narrative   Pt lives at home with spouse.   Caffeine Use: very little   Family History  Problem Relation Age of Onset  . Heart Problems Mother   . Parkinson's disease Father   . Heart Problems Father     ROS: A comprehensive Review of Systems - was performed Review of Systems  Constitutional: Negative for chills, fever and malaise/fatigue.  HENT: Negative for congestion and nosebleeds.        Has a cold  Respiratory: Negative for cough, shortness of breath and wheezing.   Cardiovascular: Negative for claudication.  Gastrointestinal: Negative for blood in stool and melena.  Genitourinary: Positive for  frequency (Nocturia x 2). Negative for dysuria and hematuria.  Musculoskeletal: Negative for back pain, joint pain and myalgias.       Some muscle soreness from digging or manual labor.  Neurological: Positive for dizziness (postitional - when 1st gets up quickly  @ night). Negative for loss of consciousness.       Neuropathy - BLE; poor balance; no pain, just loss of sensation.   Endo/Heme/Allergies: Does not bruise/bleed easily.  Psychiatric/Behavioral: Negative.  Negative for depression and memory loss. The patient is not nervous/anxious and does not have insomnia.   All other systems reviewed and are negative.   Wt Readings from Last 3 Encounters:  11/11/16 72.6 kg (160  lb)  05/13/16 70.8 kg (156 lb)  05/08/16 71.2 kg (157 lb)   PHYSICAL EXAM BP 136/68   Pulse (!) 58   Ht 5' 10.75" (1.797 m)   Wt 72.6 kg (160 lb)   BMI 22.47 kg/m  General appearance: alert, cooperative, appears stated age, no distress; pleasant mood and affect very healthy-appearing.  Neck: no adenopathy, no carotid bruit and no JVD Lungs: clear to auscultation bilaterally, normal percussion bilaterally and non-labored - fine late exp wheezing. Heart: regular rate and rhythm - w/ ectopy, S1&S2 normal, Soft SEM 1/6 @ RUSB as well as soft barely audible diastolic murmur.. No click, rub or gallop; nondisplaced PMI Abdomen: soft, non-tender; bowel sounds normal; no masses,  no org anomegaly;  no bruits Extremities: extremities normal, atraumatic, no cyanosis, and trace edema. Pulses: 2+ and symmetric;  Neurologic: Mental status: Alert, oriented, thought content appropriate   Adult ECG Report    Rate:  58;  Rhythm: sinus bradycardia and Borderline 1st Deg AVB (PR interval 196).  Nonspecific ST-T wave changes. Otherwise normal EKG - stable EKG  Recent Labs  none availablel:  PCP checks lipids, on statin.   ASSESSMENT / PLAN: Problem List Items Addressed This Visit    AF (atrial fibrillation) -- Post Operative  (Chronic)    No recurrent since CABG. This point I would think that we can probably agree that he is an acceptable risk not having on full DOAC. Continue aspirin and Plavix. Also beta blocker for presumed rate control.      Relevant Medications   metoprolol tartrate (LOPRESSOR) 25 MG tablet   Other Relevant Orders   EKG 12-Lead (Completed)   Dyslipidemia, goal LDL below 70 (Chronic)    On high dose statin - labs monitored by PCP - stable.  No myalgias      Relevant Medications   metoprolol tartrate (LOPRESSOR) 25 MG tablet   Other Relevant Orders   EKG 12-Lead (Completed)   TIA (transient ischemic attack) (Chronic)    Continues to be on statin and Plavix. No recurrent symptoms. This may have been related to A. fib, but has been no recurrence of A. fib. Therefore we chose to go with aspirin/Plavix as opposed to a DOAC      Relevant Medications   metoprolol tartrate (LOPRESSOR) 25 MG tablet   Transient Congestive dilated cardiomyopathy    EF return to normal post CABG. No recurrent episodes of heart failure. On stable beta blocker dose      Relevant Medications   metoprolol tartrate (LOPRESSOR) 25 MG tablet   Two-vessel CAD status post CABG x2;  - Primary (Chronic)    Doing well.  No Sx of angina or CHF in a very active/vibrant 83 year old-- on BB & statin, Plavix. Re-look Myoview in 2019       Relevant Medications   metoprolol tartrate (LOPRESSOR) 25 MG tablet   Other Relevant Orders   EKG 12-Lead (Completed)    2  Orders Placed This Encounter  Procedures  . EKG 12-Lead    Order Specific Question:   Where should this test be performed    Answer:   Other   Meds ordered this encounter  Medications  . metoprolol tartrate (LOPRESSOR) 25 MG tablet    Sig: Take 0.5 tablets (12.5 mg total) by mouth 2 (two) times daily.    Dispense:  90 tablet    Refill:  3  . clopidogrel (PLAVIX) 75 MG tablet    Sig: Take 1 tablet (75 mg total) by  mouth daily.    Dispense:  90 tablet     Refill:  3   Patient Instructions  NO CHANGE WITH CURRENT MEDICATIONS   Your physician wants you to follow-up in [place] DR Davida Falconi. You will receive a reminder letter in the mail two months in advance. If you don't receive a letter, please call our office to schedule the follow-up appointment.    If you need a refill on your cardiac medications before your next appointment, please call your pharmacy.      Glenetta Hew, M.D., M.S. Interventional Cardiologist   Pager # [phone] Phone # [phone] [address] [place], [place] [address]

## 2016-11-13 ENCOUNTER — Encounter: Payer: Self-pay | Admitting: Cardiology

## 2016-11-13 NOTE — Assessment & Plan Note (Signed)
No recurrent since CABG. This point I would think that we can probably agree that he is an acceptable risk not having on full DOAC. Continue aspirin and Plavix. Also beta blocker for presumed rate control.

## 2016-11-13 NOTE — Assessment & Plan Note (Signed)
EF return to normal post CABG. No recurrent episodes of heart failure. On stable beta blocker dose

## 2016-11-13 NOTE — Assessment & Plan Note (Signed)
Continues to be on statin and Plavix. No recurrent symptoms. This may have been related to A. fib, but has been no recurrence of A. fib. Therefore we chose to go with aspirin/Plavix as opposed to a DOAC

## 2017-05-13 ENCOUNTER — Encounter: Payer: Self-pay | Admitting: Cardiology

## 2017-05-13 ENCOUNTER — Ambulatory Visit (INDEPENDENT_AMBULATORY_CARE_PROVIDER_SITE_OTHER): Payer: Medicare Other | Admitting: Cardiology

## 2017-05-13 DIAGNOSIS — I480 Paroxysmal atrial fibrillation: Secondary | ICD-10-CM

## 2017-05-13 DIAGNOSIS — I2510 Atherosclerotic heart disease of native coronary artery without angina pectoris: Secondary | ICD-10-CM

## 2017-05-13 DIAGNOSIS — I351 Nonrheumatic aortic (valve) insufficiency: Secondary | ICD-10-CM | POA: Diagnosis not present

## 2017-05-13 DIAGNOSIS — E785 Hyperlipidemia, unspecified: Secondary | ICD-10-CM

## 2017-05-13 DIAGNOSIS — R001 Bradycardia, unspecified: Secondary | ICD-10-CM

## 2017-05-13 NOTE — Progress Notes (Signed)
PCP: Thressa Sheller, MD  Clinic Note: Chief Complaint  Patient presents with  . Follow-up    CAD    HPI: Tanner [name] is a 83 y.o. male with a PMH below who presents today for six-month follow-up for CAD. Recurrent class IV angina in February 2012 --> Cath = diffuse RCA disease as well as LAD disease (significant ISR of LAD BMS) --> referred for 2 vessel CABG.  He had transient postoperative A. Fib at that time but no recurrences after cardioversion.  In April 2015 he had a TIA and was noted to have moderate to severe reduction in his EF.  This proved to be transient with an improved EF on followup echocardiogram in August. He does mild aortic insufficiency is noted on echocardiogram to evaluation.  He Myoview that showed a small apical anterolateral defect it was probably consistent with known occlusion of the diagonal filled by collaterals. This is been treated medically and he is not that symptomatic. 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  [name] was last seen in March 2018. Was doing very well. Still active. No complaints.  Recent Hospitalizations: none  Studies Personally Reviewed - (if available, images/films reviewed: From Epic Chart or Care Everywhere)  none  Interval History: [name] returns today again doing very well with no major complaints. He still exercises about 6 out of every 7 days a week for 30 minutes at a time - he often will work out longer. 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 He says he may be "slowing down a little bit", not appreciably - more notable during hot summer months.  He  is starting to have a little bit of lower leg discomfort when he stands for long period of time, but not a lot of edema. He does have some mild venous stasis changes with spider veins and mild varicose veins but really does  not complain of these. Cardiac review of symptoms as follows:  No chest pain or shortness of breath with rest or exertion.  No PND, orthopnea with minimal edema.  No palpitations, lightheadedness, dizziness (but does have some vertigo), weakness or syncope/near syncope. No TIA/amaurosis fugax symptoms No claudication.  ROS: A comprehensive was performed. Pertinent Sx noted above. Review of Systems  Constitutional: Negative for malaise/fatigue.  HENT: Positive for hearing loss. Negative for congestion and nosebleeds.   Eyes: Negative for blurred vision.  Respiratory: Negative for cough, shortness of breath and wheezing.   Gastrointestinal: Negative for blood in stool, heartburn and melena.  Genitourinary: Positive for frequency. Negative for dysuria, hematuria and urgency.  Musculoskeletal: Positive for joint pain (mild OA pains). Negative for myalgias (except when sore from manual labot).  Neurological: Positive for dizziness (vertigo & positional). Negative for headaches.  Endo/Heme/Allergies: Positive for environmental allergies. Does not bruise/bleed easily.  Psychiatric/Behavioral: Negative.  Negative for memory loss. The patient is not nervous/anxious and does not have insomnia.   All other systems reviewed and are negative.  I have reviewed and (if needed) personally updated the patient's problem list, medications, allergies, past medical and surgical history, social and family history.   Past Medical History:  Diagnosis Date  . Arthritis    right shoulder   . Balance problem   . CAD S/P percutaneous coronary angioplasty 1991; 10/2010   a) PCI - LAD in 1991; b)  CATH 10/2010: Severe 2 Vessel disease = diffuse RCA ( 75% prox, 90% mid & 70 + 75% distal) & LAD (80-90% pre-stent, 75% post-stent) with occluded D1 (filled via OM-D1 collaterals)  -- CABG x 2; c) Myoview 4/'15: INTERMEDIATE RISK - small sized reversible apical/anterolateral defect -- most likely c/w known occlusion of D1  with collaterals.(med Rx)  . History of kidney stones 1959  . Hyperlipidemia with target LDL less than 70    controlled.  . Neuropathy of lower extremity   . Osteoporosis   . Postoperative atrial fibrillation [facility]) February 2012   after bypass-"shocked back in and no problems since"  . S/P CABG x 04 October 2010   LIMA-LAD, SVG to RPDA (Dr. [name])  . TGA (transient global amnesia)    "x3 for hours then goes away"  . TIA (transient ischemic attack) few months ago  . 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    [name] Kitchen Tremors of nervous system    light in hands-no problems since  . Urinary bladder cancer [facility]) Nov 2015   s/p resection in 07/2014 - Negative Cystoscopy Jan 2016    Past Surgical History:  Procedure Laterality Date  . APPENDECTOMY  as teen  . BASAL CELL CARCINOMA EXCISION  07/2014   "goes every 6 months"  . CARDIAC CATHETERIZATION  10/17/2010   80%prox w/mild in-stent restenosis w/75% mid-LADstenosis aft the stent,multi severe stenoses RCAw/heavily calcified vessel,norm LV  . CARDIOVERSION  2012   after bypass surgery  . CATARACT EXTRACTION Bilateral ~10 years ago  . COCCYX REMOVAL    . CORONARY ARTERY BYPASS GRAFT  10/2010   LIMA-LAD, SVG-rPDA  . CYSTOSCOPY W/ RETROGRADES N/A 07/12/2014   Procedure: CYSTOSCOPY WITH RETROGRADE PYELOGRAM;  Surgeon: [name], MD;  Location: [facility];  Service: Urology;  Laterality: N/A;  . CYSTOSCOPY WITH BIOPSY N/A 09/09/2014   Procedure: CYSTO WITH BIOPSY AND FULGERATION, DILATION OF URETHRAL STRICTURE;  Surgeon: [name], MD;  Location: [facility];  Service: Urology;  Laterality: N/A;  BLADDER BIOPSY  . CYSTOSCOPY WITH BIOPSY N/A 01/03/2015   Procedure: CYSTOSCOPY WITH BIOPSY;  Surgeon: [name], MD;  Location: [facility];  Service: Urology;   Laterality: N/A;  . CYSTOSCOPY WITH URETHRAL DILATATION N/A 01/03/2015   Procedure: CYSTOSCOPY WITH URETHRAL DILATATION;  Surgeon: [name], MD;  Location: [facility];  Service: Urology;  Laterality: N/A;  . DOPPLER ECHOCARDIOGRAPHY  10/18/2004   EF 55-65%,BORDERLINE -enlarged aortic root,mild aortic insuff.,trace tricus.insuff.  . heart stent placement  10-18-10   2 vessel  . [place]  . NM MYOCAR PERF WALL MOTION  07/24/1998   EF 71%  . NM MYOVIEW LTD  12/16/2013   INTERMEDIATE RISK test with small sized reversible perfusion defect in the apical anterolateral wall -- most likely consistent with known occlusion of D1 with collaterals.  . shoulder surgery Right 1988  . TONSILLECTOMY  as teen  . TRANSTHORACIC ECHOCARDIOGRAM  April 2015   EF 35-40%, mild AI, moderately dilated ascending aorta  . TRANSURETHRAL RESECTION OF PROSTATE N/A 07/12/2014   Procedure: TRANSURETHRAL RESECTION OF THE PROSTATE (TURP) WITH MITOMYCIN -C;  Surgeon: [name], MD;  Location: [facility];  Service: Urology;  Laterality: N/A;    Current Meds  Medication Sig  . atorvastatin (LIPITOR) 80 MG tablet Take 80 mg  by mouth at bedtime.   . calcium-vitamin D (OSCAL WITH D) 500-200 MG-UNIT per tablet Take 1 tablet by mouth daily.   . clopidogrel (PLAVIX) 75 MG tablet Take 1 tablet (75 mg total) by mouth daily.  . fish oil-omega-3 fatty acids 1000 MG capsule Take 1 g by mouth at bedtime.   . metoprolol tartrate (LOPRESSOR) 25 MG tablet Take 0.5 tablets (12.5 mg total) by mouth 2 (two) times daily.  . Multiple Vitamin (MULTIVITAMIN) capsule Take 1 capsule by mouth at bedtime.   . Naproxen Sodium (ALEVE PO) Take 220 mg by mouth every morning.    No Known Allergies  Social History   Social History  . Marital status: Married    Spouse name: Mary  . Number of children: 0  . Years of education: BS, MS   Occupational History  . Retired    Social History Main Topics  . Smoking status: Former  Smoker    Packs/day: 1.00    Years: 5.00    Types: Cigarettes    Quit date: 09/02/1957  . Smokeless tobacco: Never Used  . Alcohol use No  . Drug use: No  . Sexual activity: Not Asked   Other Topics Concern  . None   Social History Narrative   Pt lives at home with spouse.   Caffeine Use: very little    family history includes Heart Problems in his father and mother; Parkinson's disease in his father.  Wt Readings from Last 3 Encounters:  05/13/17 157 lb 3.2 oz (71.3 kg)  11/11/16 160 lb (72.6 kg)  05/13/16 156 lb (70.8 kg)    PHYSICAL EXAM BP 137/75   Pulse 60   Ht 5' 10.75" (1.797 m)   Wt 157 lb 3.2 oz (71.3 kg)   SpO2 97%   BMI 22.08 kg/m  Physical Exam  Constitutional: He is oriented to person, place, and time. He appears well-developed and well-nourished. No distress.  He appears younger than stated age. Well groomed.  HENT:  Head: Normocephalic and atraumatic.  Eyes: EOM are normal. No scleral icterus.  Neck: Normal range of motion. Neck supple. No hepatojugular reflux and no JVD present. Carotid bruit is not present.  Cardiovascular: Normal rate, regular rhythm, S1 normal and intact distal pulses.   Occasional extrasystoles are present. PMI is not displaced.  Exam reveals no gallop and no friction rub.   Murmur heard.  Medium-pitched harsh crescendo-decrescendo early systolic murmur is present with a grade of 2/6  at the upper right sternal border  Low-pitched rumbling decrescendo early diastolic murmur is present with a grade of 1/6  Pulmonary/Chest: Effort normal and breath sounds normal. No respiratory distress. He has no wheezes. He has no rales.  Abdominal: Soft. Bowel sounds are normal. He exhibits no distension. There is no tenderness. There is no rebound and no guarding.  Musculoskeletal: Normal range of motion. He exhibits no edema or deformity.  Lymphadenopathy:    He has no cervical adenopathy.  Neurological: He is alert and oriented to person, place,  and time. No cranial nerve deficit.  Skin: Skin is warm and dry. No rash noted. No erythema.  Mild spider veins and varicose veins the ankles both legs.  Psychiatric: He has a normal mood and affect. His behavior is normal. Judgment and thought content normal.  Nursing note and vitals reviewed.    Adult ECG Report Not checked  Other studies Reviewed: Additional studies/ records that were reviewed today include:  Recent Labs:  PCP follows lipids,  chemistries.  ASSESSMENT / PLAN: Problem List Items Addressed This Visit    AF (atrial fibrillation) -- Post Operative (Chronic)    No recurrence since CABG. Acceptable to withhold full and regulation. Remains on Plavix.      Dyslipidemia, goal LDL below 70 (Chronic)    On high-dose statin. Labs monitored by PCP. No myalgias.      Mild aortic insufficiency (Chronic)    Stable soft murmur. Unless this worsens, would not recheck an echo.      Sinus bradycardia    Tolerating minimal dose of beta blocker. Unable to titrate.      Two-vessel CAD status post CABG x2;  (Chronic)    Doing well without any active anginal symptoms. Has really been stable for a long time. Last Myoview lead to medical management as it related with known anatomy. For now we are simply dealing in view of Myoview if symptoms warrant. Since he is so active, I don't think we need to a further evaluation. He is on statin beta blocker and Plavix without complaint.         Current medicines are reviewed at length with the patient today. (+/- concerns) none The following changes have been made: none  Patient Instructions  NO CHANGES WITH CURRENT MEDICATIONS   Your physician wants you to follow-up in [place]. You will receive a reminder letter in the mail two months in advance. If you don't receive a letter, please call our office to schedule the follow-up appointment.   If you need a refill on your cardiac medications before your next  appointment, please call your pharmacy.   Studies Ordered:   No orders of the defined types were placed in this encounter.     Glenetta Hew, M.D., M.S. Interventional Cardiologist   Pager # [phone] Phone # [phone] [address] [place], [place] [address]

## 2017-05-13 NOTE — Patient Instructions (Addendum)
NO CHANGES WITH CURRENT MEDICATIONS    Your physician wants you to follow-up in 6 MONTHS WITH [name].You will receive a reminder letter in the mail two months in advance. If you don't receive a letter, please call our office to schedule the follow-up appointment.    If you need a refill on your cardiac medications before your next appointment, please call your pharmacy.  

## 2017-05-16 ENCOUNTER — Encounter: Payer: Self-pay | Admitting: Cardiology

## 2017-05-16 NOTE — Assessment & Plan Note (Signed)
On high-dose statin. Labs monitored by PCP. No myalgias.

## 2017-05-16 NOTE — Assessment & Plan Note (Signed)
Doing well without any active anginal symptoms. Has really been stable for a long time. Last Myoview lead to medical management as it related with known anatomy. For now we are simply dealing in view of Myoview if symptoms warrant. Since he is so active, I don't think we need to a further evaluation. He is on statin beta blocker and Plavix without complaint.

## 2017-05-16 NOTE — Assessment & Plan Note (Signed)
No recurrence since CABG. Acceptable to withhold full and regulation. Remains on Plavix.

## 2017-05-16 NOTE — Assessment & Plan Note (Signed)
Stable soft murmur. Unless this worsens, would not recheck an echo.

## 2017-05-16 NOTE — Assessment & Plan Note (Signed)
Tolerating minimal dose of beta blocker. Unable to titrate.

## 2017-11-11 ENCOUNTER — Ambulatory Visit (INDEPENDENT_AMBULATORY_CARE_PROVIDER_SITE_OTHER): Payer: Medicare Other | Admitting: Cardiology

## 2017-11-11 ENCOUNTER — Encounter: Payer: Self-pay | Admitting: Cardiology

## 2017-11-11 VITALS — BP 132/72 | HR 69 | Ht 70.0 in | Wt 159.2 lb

## 2017-11-11 DIAGNOSIS — E785 Hyperlipidemia, unspecified: Secondary | ICD-10-CM

## 2017-11-11 DIAGNOSIS — I2510 Atherosclerotic heart disease of native coronary artery without angina pectoris: Secondary | ICD-10-CM

## 2017-11-11 DIAGNOSIS — I351 Nonrheumatic aortic (valve) insufficiency: Secondary | ICD-10-CM | POA: Diagnosis not present

## 2017-11-11 DIAGNOSIS — R001 Bradycardia, unspecified: Secondary | ICD-10-CM

## 2017-11-11 DIAGNOSIS — I480 Paroxysmal atrial fibrillation: Secondary | ICD-10-CM

## 2017-11-11 DIAGNOSIS — Z955 Presence of coronary angioplasty implant and graft: Secondary | ICD-10-CM

## 2017-11-11 NOTE — Progress Notes (Signed)
PCP: Thressa Sheller, MD  Clinic Note: Chief Complaint  Patient presents with  . Follow-up  . Coronary Artery Disease    No active symptoms    HPI: Tanner [name] is a 84 y.o. male with a PMH below who presents today for six-month follow-up for CAD. Recurrent class IV angina in February 2012 --> Cath = diffuse RCA disease as well as LAD disease (significant ISR of LAD BMS) --> referred for 2 vessel CABG.  He had transient postoperative A. Fib at that time but no recurrences after cardioversion.  In April 2015 he had a TIA and was noted to have moderate to severe reduction in his EF.  This proved to be transient with an improved EF on followup echocardiogram in August. He does mild aortic insufficiency is noted on echocardiogram to evaluation.  He Myoview that showed a small apical anterolateral defect it was probably consistent with known occlusion of the diagonal filled by collaterals. This is been treated medically and he is not that symptomatic. 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  CARLOUS OLIVARES was last seen in September 2018. Was doing very well. Still active. No complaints.  Recent Hospitalizations: none is  Studies Personally Reviewed - (if available, images/films reviewed: From Epic Chart or Care Everywhere)  none  Interval History: [name] returns today as usual usual doing quite well.  He just returned from the [place] area where he was doing home renovation.  He does most of the Designer, fashion/clothing.  He still does his exercises just about 6 or 7 days out of the week.  Often longer than 30 minutes at a time.  He said that he slowed a little bit and is not as aggressive with his exercises but still keep the time going. He denies any cardiac symptoms except for occasional "flip flopping" of his heart that has been going on for years and has not bothered him.  No PND, orthopnea or edema, but  he does wake up about 2-3 times a night to go to bathroom. He does have some mild bilateral lower extremity peripheral neuropathy and minor signs of venous stasis but he is not bothered about his swelling. The mild spider veins are not tender uncomfortable just that he has neuropathy. With all of his activity and exercise, he denies any chest tightness or pressure with rest or exertion.   No syncope/near syncope or TIA/amaurosis fugax.  No claudication.  He does feel the "flip flopping but no rapid irregular heartbeats or palpitations.  Nothing to suggest an arrhythmia.   ROS: A comprehensive was performed. Pertinent Sx noted above. Review of Systems  Constitutional: Negative for malaise/fatigue.  HENT: Positive for hearing loss. Negative for congestion and nosebleeds.   Eyes: Negative for blurred vision.  Respiratory: Negative for cough, shortness of breath and wheezing.   Gastrointestinal: Negative for blood in stool, heartburn and melena.  Genitourinary: Positive for frequency. Negative for dysuria, hematuria and urgency.  Musculoskeletal: Positive for joint pain (mild OA pains). Negative for myalgias (except when sore from manual labot).  Neurological: Positive for dizziness (vertigo & positional) and tingling (Bilateral legs from knees down have neuropathy). Negative for headaches.  Endo/Heme/Allergies: Positive for environmental allergies. Does not bruise/bleed easily.  Psychiatric/Behavioral: Negative.  Negative for memory loss. The patient is not nervous/anxious and does not have insomnia.   All other systems reviewed and are negative.  I have reviewed and (if  needed) personally updated the patient's problem list, medications, allergies, past medical and surgical history, social and family history.   Past Medical History:  Diagnosis Date  . Arthritis    right shoulder   . Balance problem   . CAD S/P percutaneous coronary angioplasty 1991; 10/2010   a) PCI - LAD in 1991; b) CATH  10/2010: Severe 2 Vessel disease = diffuse RCA ( 75% prox, 90% mid & 70 + 75% distal) & LAD (80-90% pre-stent, 75% post-stent) with occluded D1 (filled via OM-D1 collaterals)  -- CABG x 2; c) Myoview 4/'15: INTERMEDIATE RISK - small sized reversible apical/anterolateral defect -- most likely c/w known occlusion of D1 with collaterals.(med Rx)  . History of kidney stones 1959  . Hyperlipidemia with target LDL less than 70    controlled.  . Neuropathy of lower extremity   . Osteoporosis   . Postoperative atrial fibrillation [facility]) February 2012   after bypass-"shocked back in and no problems since"  . S/P CABG x 04 October 2010   LIMA-LAD, SVG to RPDA (Dr. [name])  . TGA (transient global amnesia)    "x3 for hours then goes away"  . TIA (transient ischemic attack) few months ago  . 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    [name] Kitchen Tremors of nervous system    light in hands-no problems since  . Urinary bladder cancer [facility]) Nov 2015   s/p resection in 07/2014 - Negative Cystoscopy Jan 2016    Past Surgical History:  Procedure Laterality Date  . APPENDECTOMY  as teen  . BASAL CELL CARCINOMA EXCISION  07/2014   "goes every 6 months"  . CARDIAC CATHETERIZATION  10/17/2010   80%prox w/mild in-stent restenosis w/75% mid-LADstenosis aft the stent,multi severe stenoses RCAw/heavily calcified vessel,norm LV  . CARDIOVERSION  2012   after bypass surgery  . CATARACT EXTRACTION Bilateral ~10 years ago  . COCCYX REMOVAL    . CORONARY ARTERY BYPASS GRAFT  10/2010   LIMA-LAD, SVG-rPDA  . CYSTOSCOPY W/ RETROGRADES N/A 07/12/2014   Procedure: CYSTOSCOPY WITH RETROGRADE PYELOGRAM;  Surgeon: [name], MD;  Location: [facility];  Service: Urology;  Laterality: N/A;  . CYSTOSCOPY WITH BIOPSY N/A 09/09/2014   Procedure: CYSTO WITH  BIOPSY AND FULGERATION, DILATION OF URETHRAL STRICTURE;  Surgeon: [name], MD;  Location: [facility];  Service: Urology;  Laterality: N/A;  BLADDER BIOPSY  . CYSTOSCOPY WITH BIOPSY N/A 01/03/2015   Procedure: CYSTOSCOPY WITH BIOPSY;  Surgeon: [name], MD;  Location: [facility];  Service: Urology;  Laterality: N/A;  . CYSTOSCOPY WITH URETHRAL DILATATION N/A 01/03/2015   Procedure: CYSTOSCOPY WITH URETHRAL DILATATION;  Surgeon: [name], MD;  Location: [facility];  Service: Urology;  Laterality: N/A;  . DOPPLER ECHOCARDIOGRAPHY  10/18/2004   EF 55-65%,BORDERLINE -enlarged aortic root,mild aortic insuff.,trace tricus.insuff.  . heart stent placement  10-18-10   2 vessel  . [place]  . NM MYOCAR PERF WALL MOTION  07/24/1998   EF 71%  . NM MYOVIEW LTD  12/16/2013   INTERMEDIATE RISK test with small sized reversible perfusion defect in the apical anterolateral wall -- most likely consistent with known occlusion of D1 with collaterals.  . shoulder surgery Right 1988  . TONSILLECTOMY  as teen  . TRANSTHORACIC ECHOCARDIOGRAM  April 2015   EF 35-40%, mild  AI, moderately dilated ascending aorta  . TRANSURETHRAL RESECTION OF PROSTATE N/A 07/12/2014   Procedure: TRANSURETHRAL RESECTION OF THE PROSTATE (TURP) WITH MITOMYCIN -C;  Surgeon: [name], MD;  Location: [facility];  Service: Urology;  Laterality: N/A;    Current Meds  Medication Sig  . atorvastatin (LIPITOR) 80 MG tablet Take 80 mg by mouth at bedtime.   . calcium-vitamin D (OSCAL WITH D) 500-200 MG-UNIT per tablet Take 1 tablet by mouth daily.   . clopidogrel (PLAVIX) 75 MG tablet Take 1 tablet (75 mg total) by mouth daily.  . fish oil-omega-3 fatty acids 1000 MG capsule Take 1 g by mouth at bedtime.   . metoprolol tartrate (LOPRESSOR) 25 MG tablet Take 0.5 tablets (12.5 mg total) by mouth 2 (two) times daily.  . Multiple Vitamin (MULTIVITAMIN) capsule Take 1 capsule by mouth at bedtime.   . Naproxen Sodium  (ALEVE PO) Take 220 mg by mouth every morning.    No Known Allergies  Social History   Socioeconomic History  . Marital status: Married    Spouse name: Mary  . Number of children: 0  . Years of education: BS, MS  . Highest education level: None  Social Needs  . Financial resource strain: None  . Food insecurity - worry: None  . Food insecurity - inability: None  . Transportation needs - medical: None  . Transportation needs - non-medical: None  Occupational History  . Occupation: Retired  Tobacco Use  . Smoking status: Former Smoker    Packs/day: 1.00    Years: 5.00    Pack years: 5.00    Types: Cigarettes    Last attempt to quit: 09/02/1957    Years since quitting: 60.2  . Smokeless tobacco: Never Used  Substance and Sexual Activity  . Alcohol use: No    Alcohol/week: 0.0 oz  . Drug use: No  . Sexual activity: None  Other Topics Concern  . None  Social History Narrative   Pt lives at home with spouse.   Caffeine Use: very little    family history includes Heart Problems in his father and mother; Parkinson's disease in his father.  Wt Readings from Last 3 Encounters:  11/11/17 159 lb 3.2 oz (72.2 kg)  05/13/17 157 lb 3.2 oz (71.3 kg)  11/11/16 160 lb (72.6 kg)    PHYSICAL EXAM BP 132/72   Pulse 69   Ht 5\' 10"  (1.778 m)   Wt 159 lb 3.2 oz (72.2 kg)   BMI 22.84 kg/m  Physical Exam  Constitutional: He is oriented to person, place, and time. He appears well-developed and well-nourished. No distress.  He appears younger than stated age. Well groomed.  HENT:  Head: Normocephalic and atraumatic.  Eyes: EOM are normal.  Neck: Normal range of motion. Neck supple. No hepatojugular reflux and no JVD present. Carotid bruit is not present.  Cardiovascular: Normal rate, regular rhythm, S1 normal and intact distal pulses.  Occasional extrasystoles are present. PMI is not displaced. Exam reveals no gallop and no friction rub.  Murmur heard.  Medium-pitched harsh  crescendo-decrescendo early systolic murmur is present with a grade of 2/6 at the upper right sternal border.  Low-pitched rumbling decrescendo early diastolic murmur is present with a grade of 1/6. Pulmonary/Chest: Effort normal and breath sounds normal. No respiratory distress. He has no wheezes. He has no rales.  Abdominal: Soft. Bowel sounds are normal. He exhibits no distension. There is no tenderness. There is no rebound and no guarding.  Musculoskeletal:  Normal range of motion. He exhibits no edema.  Neurological: He is alert and oriented to person, place, and time. No cranial nerve deficit.  Skin: Skin is warm and dry.  Mild spider veins and varicose veins the ankles both legs.  Psychiatric: He has a normal mood and affect. His behavior is normal. Judgment and thought content normal.  Nursing note and vitals reviewed.    Adult ECG Report Not checked  Other studies Reviewed: Additional studies/ records that were reviewed today include:  Recent Labs:  PCP follows lipids, chemistries.  ASSESSMENT / PLAN: Problem List Items Addressed This Visit    Two-vessel CAD status post CABG x2;  - Primary (Chronic)    He really has not had any cardiac symptoms since his CABG.  No further anginal symptoms with rest or exertion.  No heart failure symptoms. He remains on Plavix, statin and beta-blocker      Sinus bradycardia (Chronic)    Relatively stable.  Would not titrate up beta-blocker however.      Presence of bare metal stent in LAD coronary artery: With significant stenosis on either end of the stent (Chronic)    Has been on Plavix for many many years.  No bleeding issues.  Stable.      Mild aortic insufficiency (Chronic)    Barely auscultated will murmur.  Would not recheck      Dyslipidemia, goal LDL below 70 (Chronic)    Remains on high-dose statin followed by PCP.  No myalgias.  Low threshold of reducing this dose if his lipids are relatively controlled.  Would prefer using  lower doses of atorvastatin to avoid myalgias.      AF (atrial fibrillation) -- Post Operative (Chronic)    No recurrence post CABG.  Continue Plavix as he probably does not need full anticoagulation. Is on low-dose beta-blocker, ostensibly also for rate control.         Current medicines are reviewed at length with the patient today. (+/- concerns) none The following changes have been made: none  Patient Instructions  NO CHANGE WITH CURRENT MEDICATIONS     Your physician wants you to follow-up in [place]. You will receive a reminder letter in the mail two months in advance. If you don't receive a letter, please call our office to schedule the follow-up appointment.   If you need a refill on your cardiac medications before your next appointment, please call your pharmacy.    Studies Ordered:   No orders of the defined types were placed in this encounter.     Glenetta Hew, M.D., M.S. Interventional Cardiologist   Pager # [phone] Phone # [phone] [address] [place], [place] [address]

## 2017-11-11 NOTE — Patient Instructions (Signed)
NO CHANGE WITH CURRENT MEDICATIONS   Your physician wants you to follow-up in 6 MONTHS WITH [name].  You will receive a reminder letter in the mail two months in advance. If you don't receive a letter, please call our office to schedule the follow-up appointment.  If you need a refill on your cardiac medications before your next appointment, please call your pharmacy.   

## 2017-11-14 ENCOUNTER — Encounter: Payer: Self-pay | Admitting: Cardiology

## 2017-11-14 NOTE — Assessment & Plan Note (Signed)
Barely auscultated will murmur.  Would not recheck

## 2017-11-14 NOTE — Assessment & Plan Note (Signed)
No recurrence post CABG.  Continue Plavix as he probably does not need full anticoagulation. Is on low-dose beta-blocker, ostensibly also for rate control.

## 2017-11-14 NOTE — Assessment & Plan Note (Signed)
Remains on high-dose statin followed by PCP.  No myalgias.  Low threshold of reducing this dose if his lipids are relatively controlled.  Would prefer using lower doses of atorvastatin to avoid myalgias.

## 2017-11-14 NOTE — Assessment & Plan Note (Signed)
He really has not had any cardiac symptoms since his CABG.  No further anginal symptoms with rest or exertion.  No heart failure symptoms. He remains on Plavix, statin and beta-blocker

## 2017-11-14 NOTE — Assessment & Plan Note (Signed)
Has been on Plavix for many many years.  No bleeding issues.  Stable.

## 2017-11-14 NOTE — Assessment & Plan Note (Signed)
Relatively stable.  Would not titrate up beta-blocker however.

## 2017-11-17 ENCOUNTER — Other Ambulatory Visit: Payer: Self-pay | Admitting: Cardiology

## 2017-12-29 ENCOUNTER — Other Ambulatory Visit: Payer: Self-pay | Admitting: Cardiology

## 2017-12-29 NOTE — Telephone Encounter (Signed)
Rx sent to pharmacy   

## 2018-07-06 ENCOUNTER — Other Ambulatory Visit: Payer: Self-pay | Admitting: Cardiology

## 2018-07-06 NOTE — Telephone Encounter (Signed)
Rx(s) sent to pharmacy electronically.  

## 2018-11-16 ENCOUNTER — Other Ambulatory Visit: Payer: Self-pay | Admitting: Cardiology

## 2019-01-11 ENCOUNTER — Telehealth: Payer: Self-pay | Admitting: Cardiology

## 2019-01-11 NOTE — Telephone Encounter (Signed)
Triage  - No one is covering the preop callback pool. Can you please schedule this appt for clearance? APP visit please. Video preferred, phone OK. If there are no slots, I can do it this week. Please let me know and I'll see what time I can open a slot.  Thanks Angie

## 2019-01-11 NOTE — Telephone Encounter (Signed)
   Primary Cardiologist:David Ellyn Hack, MD  Chart reviewed as part of pre-operative protocol coverage. Because of Tanner [name]'s past medical history and time since last visit, he/she will require a follow-up visit in order to better assess preoperative cardiovascular risk.  Pre-op covering staff: - Please schedule telehealth visit with Dr. Ellyn Hack or an APP on his team in the next week for clearance.  - Please schedule appointment and call patient to inform them. - Please contact requesting surgeon's office via preferred method (i.e, phone, fax) to inform them of need for appointment prior to surgery.  If applicable, this message will also be routed to pharmacy pool and/or primary cardiologist for input on holding anticoagulant/antiplatelet agent as requested below so that this information is available at time of patient's appointment.   Tami Lin Duke, PA  01/11/2019, 4:11 PM

## 2019-01-11 NOTE — Telephone Encounter (Signed)
Patient is scheduled with Ermalinda Barrios, PA on 5/12 due to no availability on Dr. Allison Quarry team. He only has a land line. Will have vitals completed by nurses at [facility] and will have that information available tomorrow.   YOUR CARDIOLOGY TEAM HAS ARRANGED FOR AN E-VISIT FOR YOUR APPOINTMENT - PLEASE REVIEW IMPORTANT INFORMATION BELOW SEVERAL DAYS PRIOR TO YOUR APPOINTMENT  Due to the recent COVID-19 pandemic, we are transitioning in-person office visits to tele-medicine visits in an effort to decrease unnecessary exposure to our patients, their families, and staff. These visits are billed to your insurance just like a normal visit is. We also encourage you to sign up for MyChart if you have not already done so. You will need a smartphone if possible. For patients that do not have this, we can still complete the visit using a regular telephone but do prefer a smartphone to enable video when possible. You may have a family member that lives with you that can help. If possible, we also ask that you have a blood pressure cuff and scale at home to measure your blood pressure, heart rate and weight prior to your scheduled appointment. Patients with clinical needs that need an in-person evaluation and testing will still be able to come to the office if absolutely necessary. If you have any questions, feel free to call our office.     YOUR PROVIDER WILL BE USING THE FOLLOWING PLATFORM TO COMPLETE YOUR VISIT: Telephone    2-3 DAYS BEFORE YOUR APPOINTMENT  You will receive a telephone call from one of our [place] team members - your caller ID may say "Unknown caller." If this is a video visit, we will walk you through how to get the video launched on your phone. We will remind you check your blood pressure, heart rate and weight prior to your scheduled appointment. If you have an Apple Watch or Kardia, please upload any pertinent ECG strips the day before or morning of your appointment to [facility].  Our staff will also make sure you have reviewed the consent and agree to move forward with your scheduled tele-health visit.   THE DAY OF YOUR APPOINTMENT  Approximately 15 minutes prior to your scheduled appointment, you will receive a telephone call from one of [facility] team - your caller ID may say "Unknown caller."  Our staff will confirm medications, vital signs for the day and any symptoms you may be experiencing. Please have this information available prior to the time of visit start. It may also be helpful for you to have a pad of paper and pen handy for any instructions given during your visit. They will also walk you through joining the smartphone meeting if this is a video visit.   CONSENT FOR TELE-HEALTH VISIT - PLEASE REVIEW  I hereby voluntarily request, consent and authorize [facility] and its employed or contracted physicians, physician assistants, nurse practitioners or other licensed health care professionals (the Practitioner), to provide me with telemedicine health care services (the "Services") as deemed necessary by the treating Practitioner. I acknowledge and consent to receive the Services by the Practitioner via telemedicine. I understand that the telemedicine visit will involve communicating with the Practitioner through live audiovisual communication technology and the disclosure of certain medical information by electronic transmission. I acknowledge that I have been given the opportunity to request an in-person assessment or other available alternative prior to the telemedicine visit and am voluntarily participating in the telemedicine visit.  I understand that I have the  right to withhold or withdraw my consent to the use of telemedicine in the course of my care at any time, without affecting my right to future care or treatment, and that the Practitioner or I may terminate the telemedicine visit at any time. I understand that I have the right to inspect all information  obtained and/or recorded in the course of the telemedicine visit and may receive copies of available information for a reasonable fee.  I understand that some of the potential risks of receiving the Services via telemedicine include:  [name] Kitchen Delay or interruption in medical evaluation due to technological equipment failure or disruption; . Information transmitted may not be sufficient (e.g. poor resolution of images) to allow for appropriate medical decision making by the Practitioner; and/or  . In rare instances, security protocols could fail, causing a breach of personal health information.  Furthermore, I acknowledge that it is my responsibility to provide information about my medical history, conditions and care that is complete and accurate to the best of my ability. I acknowledge that Practitioner's advice, recommendations, and/or decision may be based on factors not within their control, such as incomplete or inaccurate data provided by me or distortions of diagnostic images or specimens that may result from electronic transmissions. I understand that the practice of medicine is not an exact science and that Practitioner makes no warranties or guarantees regarding treatment outcomes. I acknowledge that I will receive a copy of this consent concurrently upon execution via email to the email address I last provided but may also request a printed copy by calling the office of [facility].    I understand that my insurance will be billed for this visit.   I have read or had this consent read to me. . I understand the contents of this consent, which adequately explains the benefits and risks of the Services being provided via telemedicine.  . I have been provided ample opportunity to ask questions regarding this consent and the Services and have had my questions answered to my satisfaction. . I give my informed consent for the services to be provided through the use of telemedicine in my medical  care  By participating in this telemedicine visit I agree to the above.

## 2019-01-11 NOTE — Telephone Encounter (Signed)
Dr. Ellyn Hack Please comment on holding plavix for 7 days for surgery.

## 2019-01-11 NOTE — Telephone Encounter (Signed)
Yes.  It is okay to hold  [facility]

## 2019-01-11 NOTE — Telephone Encounter (Signed)
New Message       [facility] Pre-operative Risk Assessment    Request for surgical clearance:  1. What type of surgery is being performed? Bladder Biopsy and installation of medication   2. When is this surgery scheduled? TBD waiting on clearance    3. What type of clearance is required (medical clearance vs. Pharmacy clearance to hold med vs. Both)? Both   4. Are there any medications that need to be held prior to surgery and how long? Plavix 1 week prior to surgery    5. Practice name and name of physician performing surgery? [facility] Dr [name]   6. What is your office phone number [phone]   7.   What is your office fax number [phone]   8.   Anesthesia type (None, local, MAC, general) ? General    Marca Ancona 01/11/2019, 10:45 AM  _________________________________________________________________   (provider comments below)

## 2019-01-12 ENCOUNTER — Other Ambulatory Visit: Payer: Self-pay

## 2019-01-12 ENCOUNTER — Encounter: Payer: Self-pay | Admitting: Physician Assistant

## 2019-01-12 ENCOUNTER — Telehealth (INDEPENDENT_AMBULATORY_CARE_PROVIDER_SITE_OTHER): Payer: Medicare Other | Admitting: Physician Assistant

## 2019-01-12 VITALS — BP 148/78 | HR 58 | Ht 70.0 in | Wt 150.8 lb

## 2019-01-12 DIAGNOSIS — I351 Nonrheumatic aortic (valve) insufficiency: Secondary | ICD-10-CM

## 2019-01-12 DIAGNOSIS — I420 Dilated cardiomyopathy: Secondary | ICD-10-CM

## 2019-01-12 DIAGNOSIS — G459 Transient cerebral ischemic attack, unspecified: Secondary | ICD-10-CM

## 2019-01-12 DIAGNOSIS — Z01818 Encounter for other preprocedural examination: Secondary | ICD-10-CM

## 2019-01-12 DIAGNOSIS — E785 Hyperlipidemia, unspecified: Secondary | ICD-10-CM

## 2019-01-12 DIAGNOSIS — I480 Paroxysmal atrial fibrillation: Secondary | ICD-10-CM

## 2019-01-12 DIAGNOSIS — I2510 Atherosclerotic heart disease of native coronary artery without angina pectoris: Secondary | ICD-10-CM

## 2019-01-12 NOTE — Progress Notes (Signed)
Virtual Visit via Telephone Note   This visit type was conducted due to national recommendations for restrictions regarding the COVID-19 Pandemic (e.g. social distancing) in an effort to limit this patient's exposure and mitigate transmission in our community.  Due to his co-morbid illnesses, this patient is at least at moderate risk for complications without adequate follow up.  This format is felt to be most appropriate for this patient at this time.  The patient did not have access to video technology/had technical difficulties with video requiring transitioning to audio format only (telephone).  All issues noted in this document were discussed and addressed.  No physical exam could be performed with this format.  Please refer to the patient's chart for his  consent to telehealth for [facility].   Date:  01/12/2019   ID:  Tanner [name], DOB [birth year], MRN [id number]  Patient Location: Home Provider Location: Home  PCP:  Tanner Sheller, MD (Inactive)  Cardiologist:  Tanner Hew, MD   Electrophysiologist:  None   Evaluation Performed:  Follow-Up Visit  Chief Complaint:  Preoperative clearance  History of Present Illness:    Tanner [name] is a 85 y.o. male with history of CAD cath 10/2010 diffuse RCA disease and ISR of BMS LAD, underwent CABG x2, post op Afib requiring cardioversion but no recurrence since. TIA 12/2013 and found to have mod-severe LVD which improved on f/u echo 2015. Myoview showed small apical anterolateral defect consistent with know occlusion of Diagonal.Chronic bradycardia.  Last saw Dr. Ellyn Hack 10/2017 and doing well except for occasional palpitations.  Patient needs bladder surgery for a small recurrent cancer by Dr. [name]. Very active, married almost 45 yrs. Lives at [facility] independent living. Walks 30-45 min daily. Denies chest pain, palpitations, dyspnea, dyspnea on exertion, dizziness or presyncope.    The patient does not have  symptoms concerning for COVID-19 infection (fever, chills, cough, or new shortness of breath).    Past Medical History:  Diagnosis Date  . Arthritis    right shoulder   . Balance problem   . CAD S/P percutaneous coronary angioplasty 1991; 10/2010   a) PCI - LAD in 1991; b) CATH 10/2010: Severe 2 Vessel disease = diffuse RCA ( 75% prox, 90% mid & 70 + 75% distal) & LAD (80-90% pre-stent, 75% post-stent) with occluded D1 (filled via OM-D1 collaterals)  -- CABG x 2; c) Myoview 4/'15: INTERMEDIATE RISK - small sized reversible apical/anterolateral defect -- most likely c/w known occlusion of D1 with collaterals.(med Rx)  . History of kidney stones 1959  . Hyperlipidemia with target LDL less than 70    controlled.  . Neuropathy of lower extremity   . Osteoporosis   . Postoperative atrial fibrillation [facility]) February 2012   after bypass-"shocked back in and no problems since"  . S/P CABG x 04 October 2010   LIMA-LAD, SVG to RPDA (Dr. [name])  . TGA (transient global amnesia)    "x3 for hours then goes away"  . TIA (transient ischemic attack) few months ago  . 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    [name] Kitchen Tremors of nervous system    light in hands-no problems since  . Urinary bladder cancer [facility]) Nov 2015   s/p resection in 07/2014 - Negative Cystoscopy Jan 2016   Past Surgical History:  Procedure Laterality Date  . APPENDECTOMY  as teen  . BASAL CELL CARCINOMA EXCISION  07/2014   "goes every 6 months"  . CARDIAC CATHETERIZATION  10/17/2010   80%prox w/mild in-stent restenosis w/75% mid-LADstenosis aft the stent,multi severe stenoses RCAw/heavily calcified vessel,norm LV  . CARDIOVERSION  2012   after bypass surgery  . CATARACT EXTRACTION Bilateral ~10 years ago  . COCCYX REMOVAL    . CORONARY ARTERY BYPASS  GRAFT  10/2010   LIMA-LAD, SVG-rPDA  . CYSTOSCOPY W/ RETROGRADES N/A 07/12/2014   Procedure: CYSTOSCOPY WITH RETROGRADE PYELOGRAM;  Surgeon: [name], MD;  Location: [facility];  Service: Urology;  Laterality: N/A;  . CYSTOSCOPY WITH BIOPSY N/A 09/09/2014   Procedure: CYSTO WITH BIOPSY AND FULGERATION, DILATION OF URETHRAL STRICTURE;  Surgeon: [name], MD;  Location: [facility];  Service: Urology;  Laterality: N/A;  BLADDER BIOPSY  . CYSTOSCOPY WITH BIOPSY N/A 01/03/2015   Procedure: CYSTOSCOPY WITH BIOPSY;  Surgeon: [name], MD;  Location: [facility];  Service: Urology;  Laterality: N/A;  . CYSTOSCOPY WITH URETHRAL DILATATION N/A 01/03/2015   Procedure: CYSTOSCOPY WITH URETHRAL DILATATION;  Surgeon: [name], MD;  Location: [facility];  Service: Urology;  Laterality: N/A;  . DOPPLER ECHOCARDIOGRAPHY  10/18/2004   EF 55-65%,BORDERLINE -enlarged aortic root,mild aortic insuff.,trace tricus.insuff.  . heart stent placement  10-18-10   2 vessel  . [place]  . NM MYOCAR PERF WALL MOTION  07/24/1998   EF 71%  . NM MYOVIEW LTD  12/16/2013   INTERMEDIATE RISK test with small sized reversible perfusion defect in the apical anterolateral wall -- most likely consistent with known occlusion of D1 with collaterals.  . shoulder surgery Right 1988  . TONSILLECTOMY  as teen  . TRANSTHORACIC ECHOCARDIOGRAM  April 2015   EF 35-40%, mild AI, moderately dilated ascending aorta  . TRANSURETHRAL RESECTION OF PROSTATE N/A 07/12/2014   Procedure: TRANSURETHRAL RESECTION OF THE PROSTATE (TURP) WITH MITOMYCIN -C;  Surgeon: [name], MD;  Location: [facility];  Service: Urology;  Laterality: N/A;     Current Meds  Medication Sig  . atorvastatin (LIPITOR) 80 MG tablet Take 80 mg by mouth at bedtime.   . calcium-vitamin D (OSCAL WITH D) 500-200 MG-UNIT per tablet Take 1 tablet by mouth daily.   . clopidogrel (PLAVIX) 75 MG tablet TAKE 1 TABLET BY MOUTH DAILY  . fish oil-omega-3  fatty acids 1000 MG capsule Take 1 g by mouth at bedtime.   . metoprolol tartrate (LOPRESSOR) 25 MG tablet TAKE 1/2 TABLET BY MOUTH TWICE DAILY  . Multiple Vitamin (MULTIVITAMIN) capsule Take 1 capsule by mouth at bedtime.   . Naproxen Sodium (ALEVE PO) Take 220 mg by mouth every morning.     Allergies:   Patient has no known allergies.   Social History   Tobacco Use  . Smoking status: Former Smoker    Packs/day: 1.00    Years: 5.00    Pack years: 5.00    Types: Cigarettes    Last attempt to quit: 09/02/1957    Years since quitting: 61.4  . Smokeless tobacco: Never Used  Substance Use Topics  . Alcohol use: No    Alcohol/week: 0.0 standard drinks  . Drug use: No     Family Hx: The patient's family history includes Heart Problems in his father and mother; Parkinson's disease in his father.  ROS:   Please see the history of present illness.    ROS  All other systems reviewed and  are negative.   Prior CV studies:   The following studies were reviewed today:  2D echo 2015 Study Conclusions  - Left ventricle: The cavity size was normal. Wall thickness was   increased in a pattern of mild LVH. Systolic function was normal.   The estimated ejection fraction was in the range of 55% to 60%.   Wall motion was normal; there were no regional wall motion   abnormalities. Doppler parameters are consistent with abnormal   left ventricular relaxation (grade 1 diastolic dysfunction). - Aortic valve: Trileaflet; moderately calcified leaflets. There   was no stenosis. There was mild to moderate regurgitation. - Aorta: Mildly dilated aortic root. Aortic root dimension: 39 mm   (ED). - Mitral valve: Mildly calcified annulus. Mildly calcified leaflets   . There was mild regurgitation. - Left atrium: The atrium was mildly dilated. - Right ventricle: The cavity size was normal. Systolic function   was normal. - Tricuspid valve: Peak RV-RA gradient (S): 23 mm Hg. - Pulmonary arteries:  PA peak pressure: 26 mm Hg (S). - Inferior vena cava: The vessel was normal in size. The   respirophasic diameter changes were in the normal range (= 50%),   consistent with normal central venous pressure.  Impressions:  - Normal LV size with mild LV hypertrophy. EF is now normal,   55-60%. Normal RV size and systolic function. Mild MR, mild to   moderate aortic insufficiency.  NST 2015 INTERMEDIATE RISK test with small sized reversible perfusion defect in the apical anterolateral wall -- most likely consistent with known occlusion of D1 with collaterals.  Labs/Other Tests and Data Reviewed:    EKG:  An ECG dated 11/11/16 was personally reviewed today and demonstrated:  sinus bradycardia at 58/m nonspecific ST changes  Recent Labs: No results found for requested labs within last 8760 hours.   Recent Lipid Panel Lab Results  Component Value Date/Time   CHOL 131 12/10/2013 04:28 AM   TRIG 98 12/10/2013 04:28 AM   HDL 51 12/10/2013 04:28 AM   CHOLHDL 2.6 12/10/2013 04:28 AM   LDLCALC 60 12/10/2013 04:28 AM    Wt Readings from Last 3 Encounters:  01/12/19 150 lb 12.8 oz (68.4 kg)  11/11/17 159 lb 3.2 oz (72.2 kg)  05/13/17 157 lb 3.2 oz (71.3 kg)     Objective:    Vital Signs:  BP (!) 148/78   Pulse (!) 58   Ht 5\' 10"  (1.778 m)   Wt 150 lb 12.8 oz (68.4 kg)   SpO2 98%   BMI 21.64 kg/m    VITAL SIGNS:  reviewed  ASSESSMENT & PLAN:    Peroperative clearance before undergoing bladder cancer surgery by Dr. [name]. He will need to hold his plavix. At higher risk given his age, history of CAD, TIA, transient CM but overall he has done well since his CABG. No angina and very active for 85 yo. Will discuss with Dr. Ellyn Hack how long he can be off Plavix and if he agrees with clearance. According to the Revised Cardiac Risk Index (RCRI), his Perioperative Risk of Major Cardiac Event is (%): 11    His Functional Capacity in METs is: 5.07 according to the [employer] Activity  Status Index (DASI).   CAD S/P CABG x 2 2012 intermediate risk NST 2015 see above. No angina  Post op Afib-no recurrence  Mild AI on echo 2015  Transient dilated cardiomyopathy normal on echo 2015  History of TIA 2015 on plavix on ASA  Dyslipidemia LDL 86 12/09/18  COVID-19 Education: The signs and symptoms of COVID-19 were discussed with the patient and how to seek care for testing (follow up with PCP or arrange E-visit).   The importance of social distancing was discussed today.  Time:   Today, I have spent  13 minutes with the patient with telehealth technology discussing the above problems.     Medication Adjustments/Labs and Tests Ordered: Current medicines are reviewed at length with the patient today.  Concerns regarding medicines are outlined above.   Tests Ordered: No orders of the defined types were placed in this encounter.   Medication Changes: No orders of the defined types were placed in this encounter.   Disposition:  Follow up in 6 month(s) Dr. Ellyn Hack  Signed, Ermalinda Barrios, PA-C  01/12/2019 11:16 AM    [place]

## 2019-01-12 NOTE — Patient Instructions (Signed)
Medication Instructions:  Your physician recommends that you continue on your current medications as directed. Please refer to the Current Medication list given to you today.  If you need a refill on your cardiac medications before your next appointment, please call your pharmacy.   Lab work: None ordered If you have labs (blood work) drawn today and your tests are completely normal, you will receive your results only by: [name] Kitchen MyChart Message (if you have MyChart) OR . A paper copy in the mail If you have any lab test that is abnormal or we need to change your treatment, we will call you to review the results.  Testing/Procedures: None ordered  Follow-Up: At [facility], you and your health needs are our priority.  As part of our continuing mission to provide you with exceptional heart care, we have created designated Provider Care Teams.  These Care Teams include your primary Cardiologist (physician) and Advanced Practice Providers (APPs -  Physician Assistants and Nurse Practitioners) who all work together to provide you with the care you need, when you need it. You will need a follow up appointment in 6 months.  Please call our office 2 months in advance to schedule this appointment.  You may see Glenetta Hew, MD or one of the following Advanced Practice Providers on your designated Care Team:   Rosaria Ferries, PA-C . Jory Sims, DNP, ANP  Any Other Special Instructions Will Be Listed Below (If Applicable).  We will check with Dr. Ellyn Hack regarding holding your clopidogrel (plavix) prior to your procedure

## 2019-01-12 NOTE — Progress Notes (Signed)
Can hold Plavix as long as they want - is as much for TIA hx as CAD.     I would not say his RCRI is that High  Had CAD - but was revascularized & asymptomatic. No CHF Sx & normal EF by Echo. No DM  Normal renal function + TIA & Low -Intermediate Risk Sgx --   He is more @ risk b/c Age --> Would consider to be Low-Intermediate Risk for Low Risk Sgx. OK to Hold Plavix.

## 2019-01-13 NOTE — Progress Notes (Signed)
Can you let patient know that Dr. Ellyn Hack is ok for him to proceed with surgery with plavix instructions below? Thanks.I am copying Dr. [name] as well.  Per Dr. Ellyn Hack  Can hold Plavix as long as they want - is as much for TIA hx as CAD.     I would not say his RCRI is that High  Had CAD - but was revascularized & asymptomatic. No CHF Sx & normal EF by Echo. No DM  Normal renal function + TIA & Low -Intermediate Risk Sgx --   He is more @ risk b/c Age --> Would consider to be Low-Intermediate Risk for Low Risk Sgx. OK to Hold Plavix.

## 2019-01-13 NOTE — Progress Notes (Signed)
Called and made patient aware of Dr. Allison Quarry recommendations below.

## 2019-01-14 ENCOUNTER — Other Ambulatory Visit: Payer: Self-pay | Admitting: Urology

## 2019-01-15 MED ORDER — GEMCITABINE CHEMO FOR BLADDER INSTILLATION 2000 MG: 2000.0000 mg | Freq: Once | INTRAVENOUS | Status: AC

## 2019-01-18 ENCOUNTER — Encounter (HOSPITAL_BASED_OUTPATIENT_CLINIC_OR_DEPARTMENT_OTHER): Payer: Self-pay | Admitting: *Deleted

## 2019-01-18 ENCOUNTER — Other Ambulatory Visit: Payer: Self-pay

## 2019-01-18 NOTE — Progress Notes (Signed)
Spoke w/ pt via phone for pre-op interview.  Npo after mn w/ exception clear liquid until 0700.  Arrive at 1100.  Will take metoprolol am dos w/ sips of water.   Pt getting lab work (cbc, bmp, ekg) and covid test done Friday 01-22-2019 @ 0945.  Pt takes plavix and is managed by dr harding , cardiologist.  Pt has cardiac clearance, dated 01-12-2019, in epic and chart.  Pt stated given instructions from dr harding when to stop plavix prior to surgery on 01-26-2019.

## 2019-01-18 NOTE — Progress Notes (Signed)
SPOKE W/  _ pt  Via phone     SCREENING SYMPTOMS OF COVID 19:   COUGH--  RUNNY NOSE---   SORE THROAT---  NASAL CONGESTION----  SNEEZING----  SHORTNESS OF BREATH---  DIFFICULTY BREATHING---  TEMP >100.0 -----  UNEXPLAINED BODY ACHES------  CHILLS --------   HEADACHES ---------  LOSS OF SMELL/ TASTE --------    ------ANSWERS TO ALL ABOVE SYMPTOMS ARE , NO  HAVE YOU OR ANY FAMILY MEMBER TRAVELLED PAST 14 DAYS OUT OF THE   COUNTY--- no STATE---- NO COUNTRY---- no  HAVE YOU OR ANY FAMILY MEMBER BEEN EXPOSED TO ANYONE WITH COVID 19?   denies

## 2019-01-22 ENCOUNTER — Other Ambulatory Visit (HOSPITAL_COMMUNITY)
Admission: RE | Admit: 2019-01-22 | Discharge: 2019-01-22 | Disposition: A | Discharge status: Home / Self Care | Payer: Medicare Other | Source: Ambulatory Visit | Attending: Urology | Admitting: Urology

## 2019-01-22 ENCOUNTER — Other Ambulatory Visit: Payer: Self-pay

## 2019-01-22 ENCOUNTER — Encounter (HOSPITAL_COMMUNITY)
Admission: RE | Admit: 2019-01-22 | Discharge: 2019-01-22 | Disposition: A | Discharge status: Home / Self Care | Payer: Medicare Other | Source: Ambulatory Visit | Attending: Urology | Admitting: Urology

## 2019-01-22 DIAGNOSIS — Z1159 Encounter for screening for other viral diseases: Secondary | ICD-10-CM | POA: Insufficient documentation

## 2019-01-22 DIAGNOSIS — Z01812 Encounter for preprocedural laboratory examination: Secondary | ICD-10-CM | POA: Diagnosis not present

## 2019-01-22 LAB — BASIC METABOLIC PANEL
Anion gap: 7 (ref 5–15)
BUN: 20 mg/dL (ref 8–23)
CO2: 26 mmol/L (ref 22–32)
Calcium: 9.4 mg/dL (ref 8.9–10.3)
Chloride: 107 mmol/L (ref 98–111)
Creatinine, Ser: 0.78 mg/dL (ref 0.61–1.24)
GFR calc Af Amer: >60 mL/min (ref >60)
GFR calc non Af Amer: >60 mL/min (ref >60)
Glucose, Bld: 171 mg/dL — ABNORMAL HIGH (ref 70–99)
Potassium: 4 mmol/L (ref 3.5–5.1)
Sodium: 140 mmol/L (ref 135–145)

## 2019-01-22 LAB — CBC
HCT: 38.6 % — ABNORMAL LOW (ref 39.0–52.0)
Hemoglobin: 12.5 g/dL — ABNORMAL LOW (ref 13.0–17.0)
MCH: 31.7 pg (ref 26.0–34.0)
MCHC: 32.4 g/dL (ref 30.0–36.0)
MCV: 98 fL (ref 80.0–100.0)
Platelets: 134 10*3/uL — ABNORMAL LOW (ref 150–400)
RBC: 3.94 MIL/uL — ABNORMAL LOW (ref 4.22–5.81)
RDW: 12 % (ref 11.5–15.5)
WBC: 4.3 10*3/uL (ref 4.0–10.5)
nRBC: 0 % (ref 0.0–0.2)

## 2019-01-22 NOTE — Progress Notes (Signed)
SPOKE W/  _charles Kiang     SCREENING SYMPTOMS OF COVID 19:   COUGH--no  RUNNY NOSE--- no  SORE THROAT---no  NASAL CONGESTION----no  SNEEZING---no-  SHORTNESS OF BREATH--no  DIFFICULTY BREATHING---no  TEMP >100.0 ----no-  UNEXPLAINED BODY ACHES------no  CHILLS ------no--   HEADACHES ---------no  LOSS OF SMELL/ TASTE --------no    HAVE YOU OR ANY FAMILY MEMBER TRAVELLED PAST 14 DAYS OUT OF THE   COUNTY---no STATE----no COUNTRY----no  HAVE YOU OR ANY FAMILY MEMBER BEEN EXPOSED TO ANYONE WITH COVID 19? no   Chane rouht

## 2019-01-22 NOTE — Progress Notes (Signed)
Report given by admitting that after pt came in front entrance of [facility] hospital just before entering admitting to check in for lab work, pt fell on his knees.  Was told that [facility] was called and assessed pt. AC stated pt seem ok but advised pt to go to ED, pt refused.  Pt was put in wheelchair, check in at admitting and came to PST for lab work.  Pt vital signs done, wnl.  Spoke w/ pt stated he feels fine and complaint of pain anywhere including knees. Pt stood out of wheelchair and to recliner for lab and ekg , he did quiet well.

## 2019-01-23 LAB — NOVEL CORONAVIRUS, NAA (HOSP ORDER, SEND-OUT TO REF LAB; TAT 18-24 HRS): SARS-CoV-2, NAA: NOT DETECTED

## 2019-01-26 ENCOUNTER — Encounter (HOSPITAL_BASED_OUTPATIENT_CLINIC_OR_DEPARTMENT_OTHER)
Admission: RE | Disposition: A | Discharge status: Home / Self Care | Payer: Self-pay | Source: Home / Self Care | Attending: Urology

## 2019-01-26 ENCOUNTER — Other Ambulatory Visit: Payer: Self-pay

## 2019-01-26 ENCOUNTER — Ambulatory Visit (HOSPITAL_BASED_OUTPATIENT_CLINIC_OR_DEPARTMENT_OTHER): Payer: Medicare Other | Admitting: Anesthesiology

## 2019-01-26 ENCOUNTER — Encounter (HOSPITAL_BASED_OUTPATIENT_CLINIC_OR_DEPARTMENT_OTHER): Payer: Self-pay | Admitting: *Deleted

## 2019-01-26 ENCOUNTER — Ambulatory Visit (HOSPITAL_BASED_OUTPATIENT_CLINIC_OR_DEPARTMENT_OTHER)
Admission: RE | Admit: 2019-01-26 | Discharge: 2019-01-26 | Disposition: A | Discharge status: Home / Self Care | Payer: Medicare Other | Attending: Urology | Admitting: Urology

## 2019-01-26 DIAGNOSIS — I2510 Atherosclerotic heart disease of native coronary artery without angina pectoris: Secondary | ICD-10-CM | POA: Insufficient documentation

## 2019-01-26 DIAGNOSIS — E7800 Pure hypercholesterolemia, unspecified: Secondary | ICD-10-CM | POA: Insufficient documentation

## 2019-01-26 DIAGNOSIS — D414 Neoplasm of uncertain behavior of bladder: Secondary | ICD-10-CM

## 2019-01-26 DIAGNOSIS — Z8673 Personal history of transient ischemic attack (TIA), and cerebral infarction without residual deficits: Secondary | ICD-10-CM | POA: Diagnosis not present

## 2019-01-26 DIAGNOSIS — D090 Carcinoma in situ of bladder: Secondary | ICD-10-CM | POA: Diagnosis not present

## 2019-01-26 DIAGNOSIS — Z85828 Personal history of other malignant neoplasm of skin: Secondary | ICD-10-CM | POA: Insufficient documentation

## 2019-01-26 DIAGNOSIS — Z7902 Long term (current) use of antithrombotics/antiplatelets: Secondary | ICD-10-CM | POA: Insufficient documentation

## 2019-01-26 DIAGNOSIS — C672 Malignant neoplasm of lateral wall of bladder: Secondary | ICD-10-CM | POA: Insufficient documentation

## 2019-01-26 DIAGNOSIS — Z79899 Other long term (current) drug therapy: Secondary | ICD-10-CM | POA: Insufficient documentation

## 2019-01-26 HISTORY — DX: Nocturia: R35.1

## 2019-01-26 HISTORY — PX: CYSTOSCOPY WITH BIOPSY: SHX5122

## 2019-01-26 HISTORY — DX: Presence of spectacles and contact lenses: Z97.3

## 2019-01-26 HISTORY — DX: Personal history of other diseases of urinary system: Z87.448

## 2019-01-26 HISTORY — DX: Long term (current) use of anticoagulants: Z79.01

## 2019-01-26 HISTORY — DX: Presence of dental prosthetic device (complete) (partial): Z97.2

## 2019-01-26 HISTORY — DX: Essential tremor: G25.0

## 2019-01-26 HISTORY — DX: Personal history of other malignant neoplasm of skin: Z98.890

## 2019-01-26 HISTORY — DX: Personal history of other malignant neoplasm of skin: Z85.828

## 2019-01-26 HISTORY — DX: Polyneuropathy, unspecified: G62.9

## 2019-01-26 HISTORY — PX: CYSTOSCOPY WITH FULGERATION: SHX6638

## 2019-01-26 HISTORY — DX: Personal history of malignant neoplasm of bladder: Z85.51

## 2019-01-26 SURGERY — CYSTOSCOPY, WITH BIOPSY
Anesthesia: General | Site: Urethra

## 2019-01-26 MED ORDER — CEFAZOLIN SODIUM-DEXTROSE 2-4 GM/100ML-% IV SOLN
2.0000 g | INTRAVENOUS | Status: AC
  Administered 2019-01-26: 2 g via INTRAVENOUS
  Filled 2019-01-26: qty 100

## 2019-01-26 MED ORDER — CEFAZOLIN SODIUM-DEXTROSE 2-4 GM/100ML-% IV SOLN
INTRAVENOUS | Status: AC
  Filled 2019-01-26: qty 100

## 2019-01-26 MED ORDER — FENTANYL CITRATE (PF) 100 MCG/2ML IJ SOLN
INTRAMUSCULAR | Status: AC
  Filled 2019-01-26: qty 2

## 2019-01-26 MED ORDER — OXYCODONE HCL 5 MG PO TABS
5.0000 mg | ORAL_TABLET | Freq: Once | ORAL | Status: DC | PRN
  Filled 2019-01-26: qty 1

## 2019-01-26 MED ORDER — OXYCODONE HCL 5 MG/5ML PO SOLN
5.0000 mg | Freq: Once | ORAL | Status: DC | PRN
  Filled 2019-01-26: qty 5

## 2019-01-26 MED ORDER — DEXAMETHASONE SODIUM PHOSPHATE 10 MG/ML IJ SOLN
INTRAMUSCULAR | Status: DC | PRN
  Administered 2019-01-26: 5 mg via INTRAVENOUS

## 2019-01-26 MED ORDER — LACTATED RINGERS IV SOLN
INTRAVENOUS | Status: DC
  Administered 2019-01-26: 50 mL/h via INTRAVENOUS
  Filled 2019-01-26: qty 1000

## 2019-01-26 MED ORDER — ONDANSETRON HCL 4 MG/2ML IJ SOLN
INTRAMUSCULAR | Status: DC | PRN
  Administered 2019-01-26: 4 mg via INTRAVENOUS

## 2019-01-26 MED ORDER — PROPOFOL 10 MG/ML IV BOLUS
INTRAVENOUS | Status: AC
  Filled 2019-01-26: qty 20

## 2019-01-26 MED ORDER — ONDANSETRON HCL 4 MG/2ML IJ SOLN
INTRAMUSCULAR | Status: AC
  Filled 2019-01-26: qty 2

## 2019-01-26 MED ORDER — PHENYLEPHRINE 40 MCG/ML (10ML) SYRINGE FOR IV PUSH (FOR BLOOD PRESSURE SUPPORT)
PREFILLED_SYRINGE | INTRAVENOUS | Status: DC | PRN
  Administered 2019-01-26: 80 ug via INTRAVENOUS
  Administered 2019-01-26 (×4): 40 ug via INTRAVENOUS

## 2019-01-26 MED ORDER — DEXAMETHASONE SODIUM PHOSPHATE 10 MG/ML IJ SOLN
INTRAMUSCULAR | Status: AC
  Filled 2019-01-26: qty 1

## 2019-01-26 MED ORDER — MEPERIDINE HCL 25 MG/ML IJ SOLN
6.2500 mg | INTRAMUSCULAR | Status: DC | PRN
  Filled 2019-01-26: qty 1

## 2019-01-26 MED ORDER — PROPOFOL 10 MG/ML IV BOLUS
INTRAVENOUS | Status: DC | PRN
  Administered 2019-01-26: 100 mg via INTRAVENOUS

## 2019-01-26 MED ORDER — STERILE WATER FOR IRRIGATION IR SOLN
Status: DC | PRN
  Administered 2019-01-26: 3000 mL

## 2019-01-26 MED ORDER — LIDOCAINE 2% (20 MG/ML) 5 ML SYRINGE
INTRAMUSCULAR | Status: AC
  Filled 2019-01-26: qty 5

## 2019-01-26 MED ORDER — LIDOCAINE HCL (CARDIAC) PF 100 MG/5ML IV SOSY
PREFILLED_SYRINGE | INTRAVENOUS | Status: DC | PRN
  Administered 2019-01-26: 100 mg via INTRAVENOUS

## 2019-01-26 MED ORDER — ONDANSETRON HCL 4 MG/2ML IJ SOLN
4.0000 mg | Freq: Once | INTRAMUSCULAR | Status: DC | PRN
  Filled 2019-01-26: qty 2

## 2019-01-26 MED ORDER — GEMCITABINE CHEMO FOR BLADDER INSTILLATION 2000 MG
2000.0000 mg | Freq: Once | INTRAVENOUS | Status: AC
  Administered 2019-01-26: 2000 mg via INTRAVESICAL
  Filled 2019-01-26: qty 52.6
  Filled 2019-01-26: qty 2000

## 2019-01-26 MED ORDER — SODIUM CHLORIDE 0.9 % IV SOLN
INTRAVENOUS | Status: DC | PRN
  Administered 2019-01-26: 11:00:00 17 mL

## 2019-01-26 MED ORDER — FENTANYL CITRATE (PF) 100 MCG/2ML IJ SOLN
25.0000 ug | INTRAMUSCULAR | Status: DC | PRN
  Administered 2019-01-26: 25 ug via INTRAVENOUS
  Filled 2019-01-26: qty 1

## 2019-01-26 MED ORDER — PHENYLEPHRINE 40 MCG/ML (10ML) SYRINGE FOR IV PUSH (FOR BLOOD PRESSURE SUPPORT)
PREFILLED_SYRINGE | INTRAVENOUS | Status: AC
  Filled 2019-01-26: qty 10

## 2019-01-26 MED ORDER — FENTANYL CITRATE (PF) 100 MCG/2ML IJ SOLN
INTRAMUSCULAR | Status: DC | PRN
  Administered 2019-01-26: 25 ug via INTRAVENOUS

## 2019-01-26 SURGICAL SUPPLY — 28 items
BAG DRAIN URO-CYSTO SKYTR STRL (DRAIN) ×4 IMPLANT
BAG URINE DRAINAGE (UROLOGICAL SUPPLIES) ×2 IMPLANT
BAG URINE LEG 19OZ MD ST LTX (BAG) IMPLANT
BAG URINE LEG 500ML (DRAIN) IMPLANT
CATH FOLEY 2WAY SLVR  5CC 16FR (CATHETERS) ×2
CATH FOLEY 2WAY SLVR 5CC 16FR (CATHETERS) IMPLANT
CATH INTERMIT  6FR 70CM (CATHETERS) ×2 IMPLANT
CLOTH BEACON ORANGE TIMEOUT ST (SAFETY) ×4 IMPLANT
ELECT REM PT RETURN 9FT ADLT (ELECTROSURGICAL) ×4
ELECTRODE REM PT RTRN 9FT ADLT (ELECTROSURGICAL) ×2 IMPLANT
GLOVE BIO SURGEON STRL SZ 6.5 (GLOVE) ×1 IMPLANT
GLOVE BIO SURGEON STRL SZ7.5 (GLOVE) ×4 IMPLANT
GLOVE BIO SURGEONS STRL SZ 6.5 (GLOVE) ×1
GLOVE BIOGEL PI IND STRL 6.5 (GLOVE) IMPLANT
GLOVE BIOGEL PI INDICATOR 6.5 (GLOVE) ×2
GOWN STRL REUS W/ TWL LRG LVL3 (GOWN DISPOSABLE) IMPLANT
GOWN STRL REUS W/ TWL XL LVL3 (GOWN DISPOSABLE) ×2 IMPLANT
GOWN STRL REUS W/TWL LRG LVL3 (GOWN DISPOSABLE) ×2
GOWN STRL REUS W/TWL XL LVL3 (GOWN DISPOSABLE) ×2
HOLDER FOLEY CATH W/STRAP (MISCELLANEOUS) ×2 IMPLANT
KIT TURNOVER CYSTO (KITS) ×4 IMPLANT
MANIFOLD NEPTUNE II (INSTRUMENTS) IMPLANT
NEEDLE HYPO 22GX1.5 SAFETY (NEEDLE) IMPLANT
NS IRRIG 500ML POUR BTL (IV SOLUTION) IMPLANT
PACK CYSTO (CUSTOM PROCEDURE TRAY) ×4 IMPLANT
TUBE CONNECTING 12'X1/4 (SUCTIONS) ×1
TUBE CONNECTING 12X1/4 (SUCTIONS) ×1 IMPLANT
WATER STERILE IRR 3000ML UROMA (IV SOLUTION) ×4 IMPLANT

## 2019-01-26 NOTE — Interval H&P Note (Signed)
History and Physical Interval Note:  01/26/2019 10:50 AM  I also discussed the nature r/b/a to bilateral retrograde pyelograms and their role in surveillance of urothelial cancer.     [name]

## 2019-01-26 NOTE — Transfer of Care (Signed)
Immediate Anesthesia Transfer of Care Note  Patient: Tanner [name]  Procedure(s) Performed: CYSTOSCOPY WITH BIOPSY, BILATERAL RETROGRADE PYELOGRAM (N/A Urethra) CYSTOSCOPY WITH FULGERATION (N/A Bladder)  Patient Location: PACU  Anesthesia Type:General  Level of Consciousness: drowsy and patient cooperative  Airway & Oxygen Therapy: Patient Spontanous Breathing  Post-op Assessment: Report given to RN and Post -op Vital signs reviewed and stable  Post vital signs: Reviewed and stable  Last Vitals:  Vitals Value Taken Time  BP 199/91 01/26/2019 11:56 AM  Temp    Pulse 52 01/26/2019 11:58 AM  Resp 15 01/26/2019 11:58 AM  SpO2 99 % 01/26/2019 11:58 AM  Vitals shown include unvalidated device data.  Last Pain:  Vitals:   01/26/19 0957  TempSrc:   PainSc: 0-No pain      Patients Stated Pain Goal: 5 (46/50/35 4656)  Complications: No apparent anesthesia complications

## 2019-01-26 NOTE — Anesthesia Preprocedure Evaluation (Signed)
Anesthesia Evaluation  Patient identified by MRN, date of birth, ID band Patient awake    Reviewed: Allergy & Precautions, H&P , NPO status , Patient's Chart, lab work & pertinent test results, reviewed documented beta blocker date and time   Airway Mallampati: II  TM Distance: >3 FB Neck ROM: Full    Dental  (+) Dental Advisory Given   Pulmonary former smoker,    Pulmonary exam normal breath sounds clear to auscultation       Cardiovascular + CAD, + CABG and +CHF  Normal cardiovascular exam+ dysrhythmias Atrial Fibrillation  Rhythm:Regular Rate:Normal  Echo 04/2014  - Left ventricle: The cavity size was normal. Wall thickness wasincreased in a pattern of mild LVH. Systolic function was normal.The estimated ejection fraction was in the range of 55% to 60%.Wall motion was normal; there were no regional wall motionabnormalities. Doppler parameters are consistent with abnormalleft ventricular relaxation (grade 1 diastolic dysfunction). - Aortic valve: Trileaflet; moderately calcified leaflets. Therewas no stenosis. There was mild to moderate regurgitation. - Aorta: Mildly dilated aortic root. Aortic root dimension: 39 mm(ED). - Mitral valve: Mildly calcified annulus. Mildly calcified leaflets. There was mild regurgitation. - Left atrium: The atrium was mildly dilated. - Right ventricle: The cavity size was normal. Systolic functionwas normal. - Tricuspid valve: Peak RV-RA gradient (S): 23 mm Hg. - Pulmonary arteries: PA peak pressure: 26 mm Hg (S). - Inferior vena cava: The vessel was normal in size. The respirophasic diameter changes were in the normal range (= 50%),consistent with normal central venous pressure.  Impressions:  - Normal LV size with mild LV hypertrophy. EF is now normal,55-60%. Normal RV size and systolic function. Mild MR, mild to moderate aortic insufficiency. - Left ventricle: The cavity size  was normal. Wall thickness was increased in a pattern of mild LVH. Systolic function was normal. The estimated ejection fraction was in the range of 55% to 60%. Wall motion was normal; there were no regional wall motion abnormalities. Doppler parameters are consistent with abnormal left ventricular relaxation (grade 1 diastolic dysfunction). - Aortic valve: Trileaflet; moderately calcified leaflets. There was no stenosis. There was mild to moderate regurgitation. - Aorta: Mildly dilated aortic root. Aortic root dimension: 39 mm(ED). - Mitral valve: Mildly calcified annulus. Mildly calcified leaflets. There was mild regurgitation. - Left atrium: The atrium was mildly dilated. - Right ventricle: The cavity size was normal. Systolic functionwas normal. - Tricuspid valve: Peak RV-RA gradient (S): 23 mm Hg. - Pulmonary arteries: PA peak pressure: 26 mm Hg (S). - Inferior vena cava: The vessel was normal in size. The respirophasic diameter changes were in the normal range (= 50%),consistent with normal central venous pressure.  Impressions: - Normal LV size with mild LV hypertrophy. EF is now normal,55-60%. Normal RV size and systolic function. Mild MR, mild to moderate aortic insufficiency.    Neuro/Psych TIA Neuromuscular disease negative psych ROS   GI/Hepatic negative GI ROS, Neg liver ROS,   Endo/Other  negative endocrine ROS  Renal/GU Renal disease     Musculoskeletal  (+) Arthritis ,   Abdominal   Peds  Hematology negative hematology ROS (+)   Anesthesia Other Findings   Reproductive/Obstetrics                             Anesthesia Physical  Anesthesia Plan  ASA: III  Anesthesia Plan: General   Post-op Pain Management:    Induction: Intravenous  PONV Risk Score and Plan: 3  and Ondansetron, Dexamethasone and Treatment may vary due to age or medical condition  Airway Management Planned: LMA  Additional  Equipment: None  Intra-op Plan:   Post-operative Plan: Extubation in OR  Informed Consent: I have reviewed the patients History and Physical, chart, labs and discussed the procedure including the risks, benefits and alternatives for the proposed anesthesia with the patient or authorized representative who has indicated his/her understanding and acceptance.     Dental advisory given  Plan Discussed with: CRNA  Anesthesia Plan Comments:         Anesthesia Quick Evaluation

## 2019-01-26 NOTE — Anesthesia Procedure Notes (Signed)
Procedure Name: LMA Insertion Date/Time: 01/26/2019 11:00 AM Performed by: Raenette Rover, CRNA Pre-anesthesia Checklist: Patient identified, Emergency Drugs available, Suction available and Patient being monitored Patient Re-evaluated:Patient Re-evaluated prior to induction Oxygen Delivery Method: Circle system utilized Preoxygenation: Pre-oxygenation with 100% oxygen Induction Type: IV induction LMA: LMA inserted LMA Size: 4.0 Number of attempts: 1 Placement Confirmation: positive ETCO2,  CO2 detector and breath sounds checked- equal and bilateral Tube secured with: Tape Dental Injury: Teeth and Oropharynx as per pre-operative assessment

## 2019-01-26 NOTE — Discharge Instructions (Signed)
Post Anesthesia Home Care Instructions  Activity: Get plenty of rest for the remainder of the day. A responsible adult should stay with you for 24 hours following the procedure.  For the next 24 hours, DO NOT: -Drive a car -Paediatric nurse -Drink alcoholic beverages -Take any medication unless instructed by your physician -Make any legal decisions or sign important papers.  Meals: Start with liquid foods such as gelatin or soup. Progress to regular foods as tolerated. Avoid greasy, spicy, heavy foods. If nausea and/or vomiting occur, drink only clear liquids until the nausea and/or vomiting subsides. Call your physician if vomiting continues.  Special Instructions/Symptoms: Your throat may feel dry or sore from the anesthesia or the breathing tube placed in your throat during surgery. If this causes discomfort, gargle with warm salt water. The discomfort should disappear within 24 hours.  If you had a scopolamine patch placed behind your ear for the management of post- operative nausea and/or vomiting:  1. The medication in the patch is effective for 72 hours, after which it should be removed.  Wrap patch in a tissue and discard in the trash. Wash hands thoroughly with soap and water. 2. You may remove the patch earlier than 72 hours if you experience unpleasant side effects which may include dry mouth, dizziness or visual disturbances. 3. Avoid touching the patch. Wash your hands with soap and water after contact with the patch.      Indwelling Urinary Catheter Care, Adult An indwelling urinary catheter is a thin tube that is put into your bladder. The tube helps to drain pee (urine) out of your body. The tube goes in through your urethra. Your urethra is where pee comes out of your body. Your pee will come out through the catheter, then it will go into a bag (drainage bag). Take good care of your catheter so it will work well.  REMOVAL OF THE CATHETER: Remove the Foley catheter on  Friday morning, Jan 29, 2019.  How to wear your catheter and bag Supplies needed  Sticky tape (adhesive tape) or a leg strap.  Alcohol wipe or soap and water (if you use tape).  A clean towel (if you use tape).  Large overnight bag.  Smaller bag (leg bag). Wearing your catheter Attach your catheter to your leg with tape or a leg strap.  Make sure the catheter is not pulled tight.  If a leg strap gets wet, take it off and put on a dry strap.  If you use tape to hold the bag on your leg: 1. Use an alcohol wipe or soap and water to wash your skin where the tape made it sticky before. 2. Use a clean towel to pat-dry that skin. 3. Use new tape to make the bag stay on your leg. Wearing your bags You should have been given a large overnight bag.  You may wear the overnight bag in the day or night.  Always have the overnight bag lower than your bladder.  Do not let the bag touch the floor.  Before you go to sleep, put a clean plastic bag in a wastebasket. Then hang the overnight bag inside the wastebasket. You should also have a smaller leg bag that fits under your clothes.  Always wear the leg bag below your knee.  Do not wear your leg bag at night. How to care for your skin and catheter Supplies needed  A clean washcloth.  Water and mild soap.  A clean towel. Caring for your  skin and catheter      Clean the skin around your catheter every day: ? Wash your hands with soap and water. ? Wet a clean washcloth in warm water and mild soap. ? Clean the skin around your urethra. ? If you are male: ? Gently spread the folds of skin around your vagina (labia). ? With the washcloth in your other hand, wipe the inner side of your labia on each side. Wipe from front to back. ? If you are male: ? Pull back any skin that covers the end of your penis (foreskin). ? With the washcloth in your other hand, wipe your penis in small circles. Start wiping at the tip of your penis,  then move away from the catheter. ? With your free hand, hold the catheter close to where it goes into your body. ? Keep holding the catheter during cleaning so it does not get pulled out. ? With the washcloth in your other hand, clean the catheter. ? Only wipe downward on the catheter. ? Do not wipe upward toward your body. Doing this may push germs into your urethra and cause infection. ? Use a clean towel to pat-dry the catheter and the skin around it. Make sure to wipe off all soap. ? Wash your hands with soap and water.  Shower every day. Do not take baths.  Do not use cream, ointment, or lotion on the area where the catheter goes into your body, unless your doctor tells you to.  Do not use powders, sprays, or lotions on your genital area.  Check your skin around the catheter every day for signs of infection. Check for: ? Redness, swelling, or pain. ? Fluid or blood. ? Warmth. ? Pus or a bad smell. How to empty the bag Supplies needed  Rubbing alcohol.  Gauze pad or cotton ball.  Tape or a leg strap. Emptying the bag Pour the pee out of your bag when it is ?- full, or at least 2-3 times a day. Do this for your overnight bag and your leg bag. 1. Wash your hands with soap and water. 2. Separate (detach) the bag from your leg. 3. Hold the bag over the toilet or a clean pail. Keep the bag lower than your hips and bladder. This is so the pee (urine) does not go back into the tube. 4. Open the pour spout. It is at the bottom of the bag. 5. Empty the pee into the toilet or pail. Do not let the pour spout touch any surface. 6. Put rubbing alcohol on a gauze pad or cotton ball. 7. Use the gauze pad or cotton ball to clean the pour spout. 8. Close the pour spout. 9. Attach the bag to your leg with tape or a leg strap. 10. Wash your hands with soap and water. Follow instructions for cleaning the drainage bag:  From the product maker.  As told by your doctor. How to change the  bag Supplies needed  Alcohol wipes.  A clean bag.  Tape or a leg strap. Changing the bag Replace your bag with a clean bag once a month. If it starts to leak, smell bad, or look dirty, change it sooner. 1. Wash your hands with soap and water. 2. Separate the dirty bag from your leg. 3. Pinch the catheter with your fingers so that pee does not spill out. 4. Separate the catheter tube from the bag tube where these tubes connect (at the connection valve). Do not let the  tubes touch any surface. 5. Clean the end of the catheter tube with an alcohol wipe. Use a different alcohol wipe to clean the end of the bag tube. 6. Connect the catheter tube to the tube of the clean bag. 7. Attach the clean bag to your leg with tape or a leg strap. Do not make the bag tight on your leg. 8. Wash your hands with soap and water. General rules   Never pull on your catheter. Never try to take it out. Doing that can hurt you.  Always wash your hands before and after you touch your catheter or bag. Use a mild, fragrance-free soap. If you do not have soap and water, use hand sanitizer.  Always make sure there are no twists or bends (kinks) in the catheter tube.  Always make sure there are no leaks in the catheter or bag.  Drink enough fluid to keep your pee pale yellow.  Do not take baths, swim, or use a hot tub.  If you are male, wipe from front to back after you poop (have a bowel movement). Contact a doctor if:  Your pee is cloudy.  Your pee smells worse than usual.  Your catheter gets clogged.  Your catheter leaks.  Your bladder feels full. Get help right away if:  You have redness, swelling, or pain where the catheter goes into your body.  You have fluid, blood, pus, or a bad smell coming from the area where the catheter goes into your body.  Your skin feels warm where the catheter goes into your body.  You have a fever.  You have pain in your: ? Belly  (abdomen). ? Legs. ? Lower back. ? Bladder.  You see blood in the catheter.  Your pee is pink or red.  You feel sick to your stomach (nauseous).  You throw up (vomit).  You have chills.  Your pee is not draining into the bag.  Your catheter gets pulled out. Summary  An indwelling urinary catheter is a thin tube that is placed into the bladder to help drain pee (urine) out of the body.  The catheter is placed into the part of the body that drains pee from the bladder (urethra).  Taking good care of your catheter will keep it working properly and help prevent problems.  Always wash your hands before and after touching your catheter or bag.  Never pull on your catheter or try to take it out. This information is not intended to replace advice given to you by your health care provider. Make sure you discuss any questions you have with your health care provider. Document Released: 12/14/2012 Document Revised: 02/09/2018 Document Reviewed: 04/04/2017 Elsevier Interactive Patient Education  2019 [place].   Bladder Biopsy, Care After Refer to this sheet in the next few weeks. These instructions provide you with information about caring for yourself after your procedure. Your health care provider may also give you more specific instructions. Your treatment has been planned according to current medical practices, but problems sometimes occur. Call your health care provider if you have any problems or questions after your procedure. What can I expect after the procedure? After the procedure, it is common to have:  Mild pain in your bladder or kidney area during urination.  Minor burning during urination.  Small amounts of blood in your urine.  A sudden urge to urinate.  A need to urinate more often than usual. Follow these instructions at home: Medicines  Take over-the-counter and  prescription medicines only as told by your health care provider.  If you were prescribed  an antibiotic medicine, take it as told by your health care provider. Do not stop taking the antibiotic even if you start to feel better. General instructions   Take a warm bath to relieve any burning sensations around your urethra.  Hold a warm, damp washcloth over the urethral area to ease pain.  Return to your normal activities as told by your health care provider. Ask your health care provider what activities are safe for you.  Do not drive for 24 hours if you received a medicine to help you relax (sedative) during your procedure. Ask your health care provider when it is safe for you to drive.  It is your responsibility to get the results of your procedure. Ask your health care provider or the department performing the procedure when your results will be ready.  Keep all follow-up visits as told by your health care provider. This is important. Contact a health care provider if:  You have a fever.  Your symptoms do not improve within 24 hours and you continue to have: ? Burning during urination. ? Increasing amounts of blood in your urine. ? Pain during urination. ? An urgent need to urinate. ? A need to urinate more often than usual. Get help right away if:  You have a lot of bleeding or more bleeding.  You have severe pain.  You are unable to urinate.  You have bright red blood in your urine.  You are passing blood clots in your urine.  You have a fever.  You have swelling, redness, or pain in your legs.  You have difficulty breathing. This information is not intended to replace advice given to you by your health care provider. Make sure you discuss any questions you have with your health care provider. Document Released: 09/05/2015 Document Revised: 01/25/2016 Document Reviewed: 09/05/2015 Elsevier Interactive Patient Education  2019 [employer].

## 2019-01-26 NOTE — Op Note (Addendum)
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  On recent surveillance cystoscopy he was noted to have some papillary tumor on the right side of the bladder and was brought today for bladder biopsy and retrograde pyelograms to do surveillance of the upper tracts.  Findings: On exam under anesthesia the penis was uncircumcised and without mass or lesion.  Normal foreskin.  Testicles descended bilaterally and palpably normal.  Scrotum normal.  On digital rectal exam the prostate was about 30 g and smooth without hard area or nodule.  On cystoscopy there was a moderate stricture at the fossa navicularis and a moderate bulb stricture.  Both of these were dilated to 24 [place] without difficulty.  Prostatic urethra are not obstructed.  Moderate trabeculation of the bladder.  No stone or foreign body in the bladder.  Trigone and ureteral orifices appeared normal with clear efflux.    There was erythematous and shaggy mucosa at right inferior bladder wall lodged up against the bladder neck some papillary tumor right lateral and some erythematous mucosa right superior and posterior.  These were biopsied.  Left retrograde pyelogram-this outlined a single ureter single collecting system unit without filling defect, stricture or dilation.  There was brisk drainage.    Right retrograde pyelogram-This outlined a single ureter single collecting system unit without filling defect, stricture or dilation.  There was brisk drainage.  Description of procedure: After consent was obtained patient brought to the operating room.  He was placed in lithotomy  position and prepped and draped in usual sterile fashion.  Timeout was performed to confirm the patient and procedure.  The cystoscope was passed per urethra but would not pass through the fossa navicularis.  I dilated this to 43 [place] and then the scope passed but reached a bulb stricture which held up the scope.  Therefore I dilated this to 62 [place] and then the scope passed without difficulty.  The bladder was inspected with 30 degree and 70 degree lens.  A 6 French open-ended catheter was used to cannulate the left ureteral orifice and retrograde injection of contrast was performed.  The right ureteral orifice was cannulated with a 6 [place] open-ended catheter and retrograde injection of contrast was performed.  Scout and periodic images were obtained. I took the 79 French cold cup biopsy forceps and biopsied right inferior, right lateral, right superior and then posterior.  These areas were fulgurated totaling between 2 to 5 cm.  All papillary tumor was biopsied and ablated in virtually all of the erythematous mucosa.  Hemostasis was excellent.  The scope was removed and a 30 French Foley placed in left to gravity drainage.  An exam under anesthesia was performed.  He was awakened taken recovery room in stable condition.  Instillation of gemcitabine in PACU: 2000 mg of gemcitabine was instilled per urethra into the bladder and left indwelling for 50 minutes.  The bladder was then drained and left to gravity drainage.  Complications: None  Blood loss: Minimal  Specimens to pathology: #1 right inferior bladder biopsy #2 right lateral #3 right superior #4 posterior  Drains: 16 French Foley  Disposition: Patient stable to PACU

## 2019-01-26 NOTE — Interval H&P Note (Signed)
History and Physical Interval Note:  01/26/2019 10:47 AM  Tanner [name]  has presented today for surgery, with the diagnosis of BLADDER NEOPLASM.  The various methods of treatment have been discussed with the patient and family. After consideration of risks, benefits and other options for treatment, the patient has consented to  Procedure(s) with comments: CYSTOSCOPY WITH BIOPSY (N/A) - 1 HR CYSTOSCOPY WITH FULGERATION (N/A) and instillation of gemcitabine in PACU as a surgical intervention.  The patient's history has been reviewed, patient examined, no change in status, stable for surgery.  I have reviewed the patient's chart and labs.  Questions were answered to the patient's satisfaction.  He elects to proceed.    [name]

## 2019-01-26 NOTE — H&P (Signed)
Office Visit Report     01/18/2019   --------------------------------------------------------------------------------   Tanner [name]  MRN: [id number]  PRIMARY CARE:  Thressa Sheller, MD  DOB: May 26, 1924, 85 year old Male  REFERRING:  Thressa Sheller, MD  SSN: -[id number]  PROVIDER:  [name], M.D.    TREATING:  [name]    LOCATION:  [facility] [phone]   --------------------------------------------------------------------------------   CC: I have bladder cancer that has been treated.  HPI: Tanner [name] is a 85 year-old male established patient who is here for follow-up of bladder cancer treatment.  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 No muscle. Presented with MH. BUN was 17, creatinine 0.95. Repeat cystoscopy, dilation of urethral stricture, exam under anesthesia, bladder biopsy Jan 2015 - no residual tumor. Biopsy tumor site and base - negative, muscle present.  -May 2016 - HG Ta recurrence at dome (given [facility])   Last upper tract: May 2016 bilateral RGP; Nov 2015 bilateral RGP negative; Oct 2015 CT A/P non-con - negative apart from bladder mass.   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 PSA 3.08 Jun 2017.   12/21/18: Cytol Jun 2019 atypical and suspicious. Mild erythema on Cysto 06/15/2018. [place] today benign. He's had no gross hematuria. Cystoscopy today.   01/18/19: On cystoscopy at prior OV, he had more prominent erythema posteriorly and a small papillary tumor on the right bladder wall. He is scheduled for cystoscopy with bladder biopsy and fulguration on 5/26. Plan for instillation of gemcitabine in the bladder postop, as well. He presents today for preoperative assessment. He does use Plavix for history of CAD and TIA. He does have clearance to hold this therapy. Today, he states that  he has been doing well since being seen last. He denies any new voiding complaints. No dysuria or gross hematuria. No fevers or chills. He denies new complaints of chest pain or shortness of breath. No changes in his overall health and he denies use of any new medications.   His bladder cancer was superficial and limitied to the bladder lining. His bladder cancer was not muscle invasive. He had treatment with the following intravesical agents: bcg. Patient denies mitomycin, interferon, and adriamycin.   He did have a TURBT. His last bladder tumor was resected approximately 09/02/2013. He has had the following number of bladder resections: 2. He did not have a radical cystectomy for the treatment of his bladder cancer.   His last cysto was 06/15/2018. His last radiologic test to evaluate the kidneys was 12/21/2018.     ALLERGIES: No Allergies    MEDICATIONS: Aleve 220 mg tablet Oral  Atorvastatin Calcium 80 MG Oral Tablet 0 Oral Daily  Calcium Citrate- Vitamin D 315 mg-250 unit tablet Oral  Fish Oil CAPS Oral  Metoprolol Tartrate 75 mg tablet Oral  Multivitamins tablet Oral  Plavix 75 mg tablet Oral     GU PSH: Bladder Instill AntiCA Agent - 2016, 2015 Cysto Dilate Stricture (M or F) - 2016 Cysto Fulgurate < 0.5 cm - 2016, 2016 Cystoscopy - 12/21/2018, 06/15/2018, 02/25/2018, 08/28/2017, 02/17/2017 Cystoscopy TURBT 2-5 cm - 2015      PSH Notes: Bladder Injection Of Cancer Treatment, Cystoscopy With Fulguration Minor Lesion (Under 64mm), Cystoscopy With Fulguration Minor Lesion (Under 44mm), Cystoscopy For Urethral Stricture, Bladder Injection Of  Cancer Treatment, Cystoscopy With Fulguration Medium Lesion (2-5cm), Kidney Surgery   NON-GU PSH: None   GU PMH: Bladder Cancer Lateral - 12/21/2018, - 06/15/2018, - 02/25/2018, - 08/28/2017, - 02/17/2017, Malignant neoplasm of lateral wall of urinary bladder, - 2015/11/27 Bladder Cancer Dome, Malignant neoplasm of dome of bladder - 2014-11-27 Urethral Stricture,  Unspec, Urethral stricture - 11/27/14 Urinary Tract Inf, Unspec site, Pyuria - 11/26/2013 Gross hematuria, Gross hematuria - 26-Nov-2013 Urinary Retention, Unspec, Urinary retention - Nov 26, 2013 Bladder, Neoplasm of Unspecified behavior, Bladder neoplasm - 2013-11-26 Other specified postprocedural states, 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    NON-GU PMH: Encounter for general adult medical examination without abnormal findings, Encounter for preventive health examination - 6606 Neoplasm of uncertain behavior of skin, Basal cell tumor - 11-26-2013 Personal history of other diseases of the musculoskeletal system and connective tissue, History of arthritis - 11/26/2013 Personal history of other endocrine, nutritional and metabolic disease, History of hypercholesterolemia - Nov 26, 2013    FAMILY HISTORY: Death - Runs In Family No Significant Family History - Runs In Family   SOCIAL HISTORY: Marital Status: Married Preferred Language: English; Ethnicity: Not Hispanic Or Latino; Race: White     Notes: Never a smoker, Retired, Caffeine use, Married, Alcohol use   REVIEW OF SYSTEMS:    GU Review Male:   Patient denies frequent urination, hard to postpone urination, burning/ pain with urination, get up at night to urinate, leakage of urine, stream starts and stops, trouble starting your stream, have to strain to urinate , erection problems, and penile pain.  Gastrointestinal (Upper):   Patient denies nausea, vomiting, and indigestion/ heartburn.  Gastrointestinal (Lower):   Patient denies constipation and diarrhea.  Constitutional:   Patient denies fever, night sweats, weight loss, and fatigue.  Skin:   Patient denies skin rash/ lesion and itching.  Eyes:   Patient denies blurred vision and double vision.  Ears/ Nose/ Throat:   Patient denies sore throat and sinus problems.  Hematologic/Lymphatic:   Patient denies swollen glands and easy bruising.  Cardiovascular:   Patient denies leg swelling and chest pains.   Respiratory:   Patient denies cough and shortness of breath.  Endocrine:   Patient denies excessive thirst.  Musculoskeletal:   Patient reports back pain. Patient denies joint pain.  Neurological:   Patient denies headaches and dizziness.  Psychologic:   Patient denies depression and anxiety.   VITAL SIGNS:      01/18/2019 09:44 AM  Weight 150.4 lb / 68.22 kg  Height 70.25 in / 178.44 cm  BP 166/82 mmHg  Pulse 62 /min  Temperature 97.6 F / 36.4 C  BMI 21.4 kg/m   MULTI-SYSTEM PHYSICAL EXAMINATION:    Constitutional: Well-nourished. No physical deformities. Normally developed. Good grooming.  Respiratory: Normal breath sounds. No labored breathing, no use of accessory muscles.   Cardiovascular: Regular rate and rhythm. No murmur, no gallop. Normal temperature, normal extremity pulses, no swelling, no varicosities.   Neurologic / Psychiatric: Oriented to time, oriented to place, oriented to person. No depression, no anxiety, no agitation.  Gastrointestinal: No mass, no tenderness, no rigidity, non obese abdomen. No suprapubic or CVAT.   Musculoskeletal: Normal gait and station of head and neck.     PAST DATA REVIEWED:  Source Of History:  Patient  Records Review:   Previous Patient Records  Urine Test Review:   Urinalysis   PROCEDURES:          Urinalysis w/Scope Dipstick Dipstick Cont'd Micro  Color:  Yellow Bilirubin: Neg mg/dL WBC/hpf: 0 - 5/hpf  Appearance: Clear Ketones: Neg mg/dL RBC/hpf: 3 - 10/hpf  Specific Gravity: 1.025 Blood: Trace ery/uL Bacteria: Rare (0-9/hpf)  pH: 6.0 Protein: Trace mg/dL Cystals: NS (Not Seen)  Glucose: 3+ mg/dL Urobilinogen: 0.2 mg/dL Casts: NS (Not Seen)    Nitrites: Neg Trichomonas: Not Present    Leukocyte Esterase: Neg leu/uL Mucous: Not Present      Epithelial Cells: 0 - 5/hpf      Yeast: NS (Not Seen)      Sperm: Not Present    ASSESSMENT:      ICD-10 Details  1 GU:   Bladder Cancer Lateral - C67.2   2   Bladder Cancer Dome -  C67.1   3   Microscopic hematuria - R31.21    PLAN:           Orders Labs Urine Culture          Document Letter(s):  Created for Patient: Clinical Summary         Notes:   I will send urine for culture today given his upcoming procedure. We reviewed his upcoming procedure in detail today and I answered of all his questions to the best of my ability. We reviewed preoperative instructions regarding stopping his Plavix and COVID-19 screening test. I instructed that he call the office with questions or concerns prior to his procedure. Otherwise, he will follow up as planned for scheduled surgery.         Next Appointment:      Next Appointment: 01/26/2019 01:00 PM    Appointment Type: Surgery     Location: [facility] [phone]    Provider: [name], M.D.    Reason for Visit: NE/OP CYSTO, BLADDER BX, FULGERATION, GEMCITABINE      * Signed by [name] on 01/18/19 at 11:55 AM (EDT)*     The information contained in this medical record document is considered private and confidential patient information. This information can only be used for the medical diagnosis and/or medical services that are being provided by the patient's selected caregivers. This information can only be distributed outside of the patient's care if the patient agrees and signs waivers of authorization for this information to be sent to an outside source or route.

## 2019-01-27 ENCOUNTER — Encounter (HOSPITAL_BASED_OUTPATIENT_CLINIC_OR_DEPARTMENT_OTHER): Payer: Self-pay | Admitting: Urology

## 2019-01-28 NOTE — Anesthesia Postprocedure Evaluation (Signed)
Anesthesia Post Note  Patient: Tanner [name]  Procedure(s) Performed: CYSTOSCOPY WITH BIOPSY, BILATERAL RETROGRADE PYELOGRAM (N/A Urethra) CYSTOSCOPY WITH FULGERATION (N/A Bladder)     Patient location during evaluation: PACU Anesthesia Type: General Level of consciousness: sedated and patient cooperative Pain management: pain level controlled Vital Signs Assessment: post-procedure vital signs reviewed and stable Respiratory status: spontaneous breathing Cardiovascular status: stable Anesthetic complications: no    Last Vitals:  Vitals:   01/26/19 1345 01/26/19 1432  BP: (!) 175/115 (!) 191/82  Pulse: (!) 48 (!) 47  Resp: 12 16  Temp:  36.4 C  SpO2: 98% 99%    Last Pain:  Vitals:   01/26/19 1432  TempSrc:   PainSc: 0-No pain                 Nolon Nations

## 2019-02-19 ENCOUNTER — Other Ambulatory Visit: Payer: Self-pay | Admitting: *Deleted

## 2019-02-19 ENCOUNTER — Other Ambulatory Visit: Payer: Self-pay

## 2019-02-19 MED ORDER — CLOPIDOGREL BISULFATE 75 MG PO TABS: 75.0000 mg | ORAL_TABLET | Freq: Every day | ORAL | 1 refills | Status: DC

## 2019-02-19 MED ORDER — CLOPIDOGREL BISULFATE 75 MG PO TABS: 75.0000 mg | ORAL_TABLET | Freq: Every day | ORAL | 3 refills | Status: DC

## 2019-02-19 NOTE — Telephone Encounter (Signed)
Rx(s) sent to pharmacy electronically.  

## 2019-02-26 ENCOUNTER — Other Ambulatory Visit: Payer: Self-pay | Admitting: Cardiology

## 2019-03-23 ENCOUNTER — Encounter (HOSPITAL_COMMUNITY): Payer: Self-pay | Admitting: Emergency Medicine

## 2019-03-23 ENCOUNTER — Emergency Department (HOSPITAL_COMMUNITY): Payer: Medicare Other

## 2019-03-23 ENCOUNTER — Inpatient Hospital Stay (HOSPITAL_COMMUNITY): Payer: Medicare Other

## 2019-03-23 ENCOUNTER — Inpatient Hospital Stay (HOSPITAL_COMMUNITY)
Admission: EM | Admit: 2019-03-23 | Discharge: 2019-03-28 | DRG: 871 | Disposition: A | Discharge status: Home Health | Payer: Medicare Other | Attending: Internal Medicine | Admitting: Internal Medicine

## 2019-03-23 ENCOUNTER — Other Ambulatory Visit: Payer: Self-pay

## 2019-03-23 DIAGNOSIS — N390 Urinary tract infection, site not specified: Secondary | ICD-10-CM | POA: Diagnosis present

## 2019-03-23 DIAGNOSIS — Z87442 Personal history of urinary calculi: Secondary | ICD-10-CM

## 2019-03-23 DIAGNOSIS — Z8551 Personal history of malignant neoplasm of bladder: Secondary | ICD-10-CM

## 2019-03-23 DIAGNOSIS — R2689 Other abnormalities of gait and mobility: Secondary | ICD-10-CM | POA: Diagnosis present

## 2019-03-23 DIAGNOSIS — I10 Essential (primary) hypertension: Secondary | ICD-10-CM | POA: Diagnosis present

## 2019-03-23 DIAGNOSIS — A419 Sepsis, unspecified organism: Secondary | ICD-10-CM | POA: Diagnosis present

## 2019-03-23 DIAGNOSIS — Z87891 Personal history of nicotine dependence: Secondary | ICD-10-CM | POA: Diagnosis not present

## 2019-03-23 DIAGNOSIS — Z961 Presence of intraocular lens: Secondary | ICD-10-CM | POA: Diagnosis present

## 2019-03-23 DIAGNOSIS — E119 Type 2 diabetes mellitus without complications: Secondary | ICD-10-CM | POA: Diagnosis present

## 2019-03-23 DIAGNOSIS — A4151 Sepsis due to Escherichia coli [E. coli]: Secondary | ICD-10-CM | POA: Diagnosis not present

## 2019-03-23 DIAGNOSIS — E785 Hyperlipidemia, unspecified: Secondary | ICD-10-CM | POA: Diagnosis present

## 2019-03-23 DIAGNOSIS — I959 Hypotension, unspecified: Secondary | ICD-10-CM | POA: Diagnosis present

## 2019-03-23 DIAGNOSIS — M810 Age-related osteoporosis without current pathological fracture: Secondary | ICD-10-CM | POA: Diagnosis present

## 2019-03-23 DIAGNOSIS — Z955 Presence of coronary angioplasty implant and graft: Secondary | ICD-10-CM | POA: Diagnosis not present

## 2019-03-23 DIAGNOSIS — I2510 Atherosclerotic heart disease of native coronary artery without angina pectoris: Secondary | ICD-10-CM | POA: Diagnosis present

## 2019-03-23 DIAGNOSIS — R7303 Prediabetes: Secondary | ICD-10-CM | POA: Diagnosis not present

## 2019-03-23 DIAGNOSIS — R5381 Other malaise: Secondary | ICD-10-CM | POA: Diagnosis present

## 2019-03-23 DIAGNOSIS — Z951 Presence of aortocoronary bypass graft: Secondary | ICD-10-CM | POA: Diagnosis not present

## 2019-03-23 DIAGNOSIS — Z85828 Personal history of other malignant neoplasm of skin: Secondary | ICD-10-CM | POA: Diagnosis not present

## 2019-03-23 DIAGNOSIS — N3000 Acute cystitis without hematuria: Secondary | ICD-10-CM | POA: Diagnosis present

## 2019-03-23 DIAGNOSIS — Z820 Family history of epilepsy and other diseases of the nervous system: Secondary | ICD-10-CM | POA: Diagnosis not present

## 2019-03-23 DIAGNOSIS — Z9841 Cataract extraction status, right eye: Secondary | ICD-10-CM

## 2019-03-23 DIAGNOSIS — R7881 Bacteremia: Secondary | ICD-10-CM | POA: Diagnosis not present

## 2019-03-23 DIAGNOSIS — J690 Pneumonitis due to inhalation of food and vomit: Secondary | ICD-10-CM | POA: Diagnosis present

## 2019-03-23 DIAGNOSIS — Z9842 Cataract extraction status, left eye: Secondary | ICD-10-CM

## 2019-03-23 DIAGNOSIS — Z8673 Personal history of transient ischemic attack (TIA), and cerebral infarction without residual deficits: Secondary | ICD-10-CM

## 2019-03-23 DIAGNOSIS — R001 Bradycardia, unspecified: Secondary | ICD-10-CM | POA: Diagnosis present

## 2019-03-23 DIAGNOSIS — I420 Dilated cardiomyopathy: Secondary | ICD-10-CM | POA: Diagnosis present

## 2019-03-23 DIAGNOSIS — D61818 Other pancytopenia: Secondary | ICD-10-CM | POA: Diagnosis present

## 2019-03-23 DIAGNOSIS — E876 Hypokalemia: Secondary | ICD-10-CM | POA: Diagnosis present

## 2019-03-23 DIAGNOSIS — Z20828 Contact with and (suspected) exposure to other viral communicable diseases: Secondary | ICD-10-CM | POA: Diagnosis present

## 2019-03-23 DIAGNOSIS — E1165 Type 2 diabetes mellitus with hyperglycemia: Secondary | ICD-10-CM | POA: Diagnosis present

## 2019-03-23 DIAGNOSIS — Z8249 Family history of ischemic heart disease and other diseases of the circulatory system: Secondary | ICD-10-CM | POA: Diagnosis not present

## 2019-03-23 DIAGNOSIS — Z7902 Long term (current) use of antithrombotics/antiplatelets: Secondary | ICD-10-CM

## 2019-03-23 DIAGNOSIS — Z79899 Other long term (current) drug therapy: Secondary | ICD-10-CM

## 2019-03-23 DIAGNOSIS — E118 Type 2 diabetes mellitus with unspecified complications: Secondary | ICD-10-CM | POA: Diagnosis not present

## 2019-03-23 LAB — TSH: TSH: 0.885 u[IU]/mL (ref 0.350–4.500)

## 2019-03-23 LAB — COMPREHENSIVE METABOLIC PANEL
ALT: 30 U/L (ref 0–44)
AST: 39 U/L (ref 15–41)
Albumin: 3.2 g/dL — ABNORMAL LOW (ref 3.5–5.0)
Alkaline Phosphatase: 70 U/L (ref 38–126)
Anion gap: 12 (ref 5–15)
BUN: 22 mg/dL (ref 8–23)
CO2: 23 mmol/L (ref 22–32)
Calcium: 8.9 mg/dL (ref 8.9–10.3)
Chloride: 103 mmol/L (ref 98–111)
Creatinine, Ser: 0.89 mg/dL (ref 0.61–1.24)
GFR calc Af Amer: >60 mL/min (ref >60)
GFR calc non Af Amer: >60 mL/min (ref >60)
Glucose, Bld: 201 mg/dL — ABNORMAL HIGH (ref 70–99)
Potassium: 3.4 mmol/L — ABNORMAL LOW (ref 3.5–5.1)
Sodium: 138 mmol/L (ref 135–145)
Total Bilirubin: 1.6 mg/dL — ABNORMAL HIGH (ref 0.3–1.2)
Total Protein: 7 g/dL (ref 6.5–8.1)

## 2019-03-23 LAB — CBC WITH DIFFERENTIAL/PLATELET
Abs Immature Granulocytes: 0 10*3/uL (ref 0.00–0.07)
Basophils Absolute: 0 10*3/uL (ref 0.0–0.1)
Basophils Relative: 0 %
Eosinophils Absolute: 0 10*3/uL (ref 0.0–0.5)
Eosinophils Relative: 1 %
HCT: 35.7 % — ABNORMAL LOW (ref 39.0–52.0)
Hemoglobin: 11.6 g/dL — ABNORMAL LOW (ref 13.0–17.0)
Immature Granulocytes: 0 %
Lymphocytes Relative: 7 %
Lymphs Abs: 0.2 10*3/uL — ABNORMAL LOW (ref 0.7–4.0)
MCH: 32.1 pg (ref 26.0–34.0)
MCHC: 32.5 g/dL (ref 30.0–36.0)
MCV: 98.9 fL (ref 80.0–100.0)
Monocytes Absolute: 0 10*3/uL — ABNORMAL LOW (ref 0.1–1.0)
Monocytes Relative: 0 %
Neutro Abs: 2.6 10*3/uL (ref 1.7–7.7)
Neutrophils Relative %: 92 %
Platelets: 116 10*3/uL — ABNORMAL LOW (ref 150–400)
RBC: 3.61 MIL/uL — ABNORMAL LOW (ref 4.22–5.81)
RDW: 12.2 % (ref 11.5–15.5)
WBC: 2.9 10*3/uL — ABNORMAL LOW (ref 4.0–10.5)
nRBC: 0 % (ref 0.0–0.2)

## 2019-03-23 LAB — URINALYSIS, ROUTINE W REFLEX MICROSCOPIC
Bilirubin Urine: NEGATIVE
Glucose, UA: NEGATIVE mg/dL
Ketones, ur: NEGATIVE mg/dL
Nitrite: POSITIVE — AB
Protein, ur: 30 mg/dL — AB
Specific Gravity, Urine: 1.015 (ref 1.005–1.030)
WBC, UA: >50 WBC/hpf — ABNORMAL HIGH (ref 0–5)
pH: 5 (ref 5.0–8.0)

## 2019-03-23 LAB — CBC
HCT: 33.7 % — ABNORMAL LOW (ref 39.0–52.0)
Hemoglobin: 10.9 g/dL — ABNORMAL LOW (ref 13.0–17.0)
MCH: 32.5 pg (ref 26.0–34.0)
MCHC: 32.3 g/dL (ref 30.0–36.0)
MCV: 100.6 fL — ABNORMAL HIGH (ref 80.0–100.0)
Platelets: 106 10*3/uL — ABNORMAL LOW (ref 150–400)
RBC: 3.35 MIL/uL — ABNORMAL LOW (ref 4.22–5.81)
RDW: 12.5 % (ref 11.5–15.5)
WBC: 12.7 10*3/uL — ABNORMAL HIGH (ref 4.0–10.5)
nRBC: 0 % (ref 0.0–0.2)

## 2019-03-23 LAB — BLOOD CULTURE ID PANEL (REFLEXED)

## 2019-03-23 LAB — FERRITIN: Ferritin: 283 ng/mL (ref 24–336)

## 2019-03-23 LAB — CREATININE, SERUM
Creatinine, Ser: 0.97 mg/dL (ref 0.61–1.24)
GFR calc Af Amer: >60 mL/min (ref >60)
GFR calc non Af Amer: >60 mL/min (ref >60)

## 2019-03-23 LAB — LACTIC ACID, PLASMA
Lactic Acid, Venous: 3.4 mmol/L (ref 0.5–1.9)
Lactic Acid, Venous: 4.6 mmol/L (ref 0.5–1.9)
Lactic Acid, Venous: 1.9 mmol/L (ref 0.5–1.9)

## 2019-03-23 LAB — D-DIMER, QUANTITATIVE: D-Dimer, Quant: 1.83 ug/mL-FEU — ABNORMAL HIGH (ref 0.00–0.50)

## 2019-03-23 LAB — SARS CORONAVIRUS 2 BY RT PCR (HOSPITAL ORDER, PERFORMED IN ~~LOC~~ HOSPITAL LAB): SARS Coronavirus 2: NEGATIVE

## 2019-03-23 LAB — APTT: aPTT: 29 seconds (ref 24–36)

## 2019-03-23 LAB — HEMOGLOBIN A1C
Hgb A1c MFr Bld: 7.5 % — ABNORMAL HIGH (ref 4.8–5.6)
Mean Plasma Glucose: 168.55 mg/dL

## 2019-03-23 LAB — PROTIME-INR
INR: 1.2 (ref 0.8–1.2)
Prothrombin Time: 14.7 seconds (ref 11.4–15.2)

## 2019-03-23 LAB — GLUCOSE, CAPILLARY: Glucose-Capillary: 362 mg/dL — ABNORMAL HIGH (ref 70–99)

## 2019-03-23 LAB — FIBRINOGEN: Fibrinogen: 584 mg/dL — ABNORMAL HIGH (ref 210–475)

## 2019-03-23 LAB — MAGNESIUM: Magnesium: 1.9 mg/dL (ref 1.7–2.4)

## 2019-03-23 LAB — C-REACTIVE PROTEIN: CRP: 5.3 mg/dL — ABNORMAL HIGH (ref <1.0)

## 2019-03-23 LAB — LACTATE DEHYDROGENASE: LDH: 150 U/L (ref 98–192)

## 2019-03-23 LAB — TRIGLYCERIDES: Triglycerides: 53 mg/dL (ref <150)

## 2019-03-23 LAB — PROCALCITONIN: Procalcitonin: 17.85 ng/mL

## 2019-03-23 MED ORDER — SODIUM CHLORIDE (PF) 0.9 % IJ SOLN
INTRAMUSCULAR | Status: AC
  Filled 2019-03-23: qty 100

## 2019-03-23 MED ORDER — CLOPIDOGREL BISULFATE 75 MG PO TABS
75.0000 mg | ORAL_TABLET | Freq: Every day | ORAL | Status: DC
  Administered 2019-03-23 – 2019-03-28 (×6): 75 mg via ORAL
  Filled 2019-03-23 (×6): qty 1

## 2019-03-23 MED ORDER — ONDANSETRON HCL 4 MG PO TABS: 4.0000 mg | ORAL_TABLET | Freq: Four times a day (QID) | ORAL | Status: DC | PRN

## 2019-03-23 MED ORDER — ONDANSETRON HCL 4 MG/2ML IJ SOLN: 4.0000 mg | Freq: Four times a day (QID) | INTRAMUSCULAR | Status: DC | PRN

## 2019-03-23 MED ORDER — IOHEXOL 350 MG/ML SOLN
100.0000 mL | Freq: Once | INTRAVENOUS | Status: AC | PRN
  Administered 2019-03-23: 100 mL via INTRAVENOUS

## 2019-03-23 MED ORDER — SODIUM CHLORIDE 0.9 % IV SOLN
2.0000 g | Freq: Once | INTRAVENOUS | Status: AC
  Administered 2019-03-23: 2 g via INTRAVENOUS
  Filled 2019-03-23: qty 2

## 2019-03-23 MED ORDER — ATORVASTATIN CALCIUM 40 MG PO TABS
80.0000 mg | ORAL_TABLET | Freq: Every day | ORAL | Status: DC
  Administered 2019-03-23 – 2019-03-27 (×5): 80 mg via ORAL
  Filled 2019-03-23 (×5): qty 2

## 2019-03-23 MED ORDER — VANCOMYCIN HCL IN DEXTROSE 1-5 GM/200ML-% IV SOLN
1000.0000 mg | Freq: Once | INTRAVENOUS | Status: DC
  Filled 2019-03-23: qty 200

## 2019-03-23 MED ORDER — ENOXAPARIN SODIUM 40 MG/0.4ML ~~LOC~~ SOLN
40.0000 mg | SUBCUTANEOUS | Status: DC
  Administered 2019-03-23 – 2019-03-27 (×5): 40 mg via SUBCUTANEOUS
  Filled 2019-03-23 (×5): qty 0.4

## 2019-03-23 MED ORDER — VANCOMYCIN HCL 10 G IV SOLR
1500.0000 mg | Freq: Once | INTRAVENOUS | Status: AC
  Administered 2019-03-23: 1500 mg via INTRAVENOUS
  Filled 2019-03-23: qty 1000

## 2019-03-23 MED ORDER — SODIUM CHLORIDE 0.9 % IV BOLUS
1000.0000 mL | Freq: Once | INTRAVENOUS | Status: AC
  Administered 2019-03-23: 1000 mL via INTRAVENOUS

## 2019-03-23 MED ORDER — ACETAMINOPHEN 325 MG PO TABS
650.0000 mg | ORAL_TABLET | Freq: Four times a day (QID) | ORAL | Status: DC | PRN
  Administered 2019-03-28: 650 mg via ORAL
  Filled 2019-03-23: qty 2

## 2019-03-23 MED ORDER — SODIUM CHLORIDE 0.9 % IV SOLN
1.0000 g | INTRAVENOUS | Status: DC
  Administered 2019-03-23: 1 g via INTRAVENOUS
  Filled 2019-03-23: qty 10

## 2019-03-23 MED ORDER — POTASSIUM CHLORIDE IN NACL 40-0.9 MEQ/L-% IV SOLN
INTRAVENOUS | Status: DC
  Administered 2019-03-23 – 2019-03-28 (×7): 75 mL/h via INTRAVENOUS
  Filled 2019-03-23 (×10): qty 1000

## 2019-03-23 MED ORDER — ACETAMINOPHEN 500 MG PO TABS
1000.0000 mg | ORAL_TABLET | Freq: Once | ORAL | Status: AC
  Administered 2019-03-23: 1000 mg via ORAL
  Filled 2019-03-23: qty 2

## 2019-03-23 MED ORDER — METRONIDAZOLE IN NACL 5-0.79 MG/ML-% IV SOLN
500.0000 mg | Freq: Once | INTRAVENOUS | Status: AC
  Administered 2019-03-23: 05:00:00 500 mg via INTRAVENOUS
  Filled 2019-03-23: qty 100

## 2019-03-23 MED ORDER — SODIUM CHLORIDE 0.9 % IV SOLN
2.0000 g | INTRAVENOUS | Status: DC
  Administered 2019-03-24 – 2019-03-26 (×3): 2 g via INTRAVENOUS
  Filled 2019-03-23 (×3): qty 2

## 2019-03-23 MED ORDER — METOPROLOL TARTRATE 25 MG PO TABS
12.5000 mg | ORAL_TABLET | Freq: Two times a day (BID) | ORAL | Status: DC
  Administered 2019-03-23 – 2019-03-25 (×5): 12.5 mg via ORAL
  Filled 2019-03-23 (×5): qty 1

## 2019-03-23 MED ORDER — INSULIN ASPART 100 UNIT/ML ~~LOC~~ SOLN: 0.0000 [IU] | Freq: Three times a day (TID) | SUBCUTANEOUS | Status: DC

## 2019-03-23 MED ORDER — INSULIN ASPART 100 UNIT/ML ~~LOC~~ SOLN
0.0000 [IU] | Freq: Every day | SUBCUTANEOUS | Status: DC
  Administered 2019-03-23: 5 [IU] via SUBCUTANEOUS

## 2019-03-23 MED ORDER — ACETAMINOPHEN 650 MG RE SUPP: 650.0000 mg | Freq: Four times a day (QID) | RECTAL | Status: DC | PRN

## 2019-03-23 NOTE — ED Notes (Signed)
Date and time results received: 03/23/19 5:37 AM  (use smartphrase ".now" to insert current time)  Test: Lactic Acid Critical Value: 3.4  Name of Provider Notified: Merleen Nicely, [place]  Orders Received? Or Actions Taken?:

## 2019-03-23 NOTE — Sepsis Progress Note (Signed)
Notified bedside nurse of need to draw repeat lactic acid. 

## 2019-03-23 NOTE — Progress Notes (Signed)
Patient able to update Wife on room # and admission.

## 2019-03-23 NOTE — ED Provider Notes (Addendum)
[facility] DEPT Provider Note   CSN: [id number] Arrival date & time: 03/23/19  0415     History   Chief Complaint Chief Complaint  Patient presents with  . Weakness    HPI Tanner [name] is a 85 y.o. male.     Tanner [name] is a 85 y.o. male with a history of CAD, hyperlipidemia, TIA, bladder cancer, who presents to the ED for evaluation of fever which he reports he developed today.  Patient reports fever of 104 today, but denies any other symptoms.  Patient denies any chest pain, shortness of breath or cough.  He denies any abdominal pain, he denies nausea or vomiting, but was given Zofran with EMS.  He denies any diarrhea or constipation.  Denies dysuria or urinary frequency.  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  Patient did not initially report this but later reported that a few weeks ago he tested COVID positive at his doctor's office.  Unable to find this in any documentation, but patient reports he has continued to interact with different people, goes to the bank into his workshop and often does not wear a mask.  He reports aside from his fever and feeling unwell today he is really not had any other symptoms.  No other fevers previously.     Past Medical History:  Diagnosis Date  . Anticoagulant long-term use    plavix  (for hx TIA)  . Arthritis    right shoulder   . Benign essential tremor    hands -- occasional  . CAD S/P percutaneous coronary angioplasty cardiologist-- dr harding   a) PCI and BMS to LAD in 1991; b) CATH 10/2010: Severe 2 Vessel disease = diffuse RCA ( 75% prox, 90% mid & 70 + 75% distal) & LAD (80-90% pre-stent, 75% post-stent) with occluded D1 (filled via OM-D1 collaterals)  -- CABG x 2; c) Myoview 4/'15: INTERMEDIATE RISK - small sized reversible  apical/anterolateral defect -- most likely c/w known occlusion of D1 with collaterals.(med Rx)  . History of basal cell carcinoma (BCC) excision   . History of bladder cancer urologist-- dr eskridge   dx 11/ 2015-- s/p TURBT  . History of kidney stones   . History of TIA (transient ischemic attack) 12/09/2013  . History of urethral stricture   . Hyperlipidemia with target LDL less than 70    controlled.  . Neuropathy of lower extremity   . Nocturia   . Osteoporosis   . Peripheral neuropathy    lower extremity  . Postoperative atrial fibrillation ([place]) 10/2010   after bypass-"shocked back in and no problems since":   per cardiologist note, dr harding, no recurrence  . S/P CABG x 2 10/18/2010   LIMA-LAD, SVG to RPDA (Dr. [name])  . TGA (transient global amnesia)    "x3 for hours then goes away"  . 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    [name] Kitchen Wears glasses   . Wears partial dentures    upper    Patient Active Problem List   Diagnosis Date Noted  . Two-vessel CAD status post CABG x2;    . Bladder neoplasm 07/12/2014  .  Sinus bradycardia 07/12/2014  . Unspecified hereditary and idiopathic peripheral neuropathy 01/20/2014  . Transient Congestive dilated cardiomyopathy 12/11/2013  . TIA (transient ischemic attack) 12/09/2013  . Thrombocytopenia ([place]) 12/09/2013  . Dyslipidemia, goal LDL below 70 03/16/2013  . Mild aortic insufficiency 02/08/2013  . AF (atrial fibrillation) -- Post Operative   . Presence of bare metal stent in LAD coronary artery: With significant stenosis on either end of the stent 03/17/2011    Past Surgical History:  Procedure Laterality Date  . APPENDECTOMY  as teen  . BASAL CELL CARCINOMA EXCISION  07/2014  . CARDIAC CATHETERIZATION  10/17/2010   80%prox w/mild in-stent  restenosis w/75% mid-LADstenosis aft the stent,multi severe stenoses RCAw/heavily calcified vessel,norm LV  . CARDIOVERSION  2012   after bypass surgery  . CATARACT EXTRACTION W/ INTRAOCULAR LENS  IMPLANT, BILATERAL  2009  approx.  . COCCYX REMOVAL    . CORONARY ANGIOPLASTY WITH STENT PLACEMENT  1991   PCI and BMS to LAD  . CORONARY ARTERY BYPASS GRAFT  10-18-2010  dr Ricard Dillon @[facility]    LIMA-LAD, SVG-rPDA  . CYSTOSCOPY W/ RETROGRADES N/A 07/12/2014   Procedure: CYSTOSCOPY WITH RETROGRADE PYELOGRAM;  Surgeon: [name], MD;  Location: [facility];  Service: Urology;  Laterality: N/A;  . CYSTOSCOPY WITH BIOPSY N/A 09/09/2014   Procedure: CYSTO WITH BIOPSY AND FULGERATION, DILATION OF URETHRAL STRICTURE;  Surgeon: [name], MD;  Location: [facility];  Service: Urology;  Laterality: N/A;  BLADDER BIOPSY  . CYSTOSCOPY WITH BIOPSY N/A 01/03/2015   Procedure: CYSTOSCOPY WITH BIOPSY;  Surgeon: [name], MD;  Location: [facility];  Service: Urology;  Laterality: N/A;  . CYSTOSCOPY WITH BIOPSY N/A 01/26/2019   Procedure: CYSTOSCOPY WITH BIOPSY, BILATERAL RETROGRADE PYELOGRAM;  Surgeon: [name], MD;  Location: [facility];  Service: Urology;  Laterality: N/A;  . CYSTOSCOPY WITH FULGERATION N/A 01/26/2019   Procedure: CYSTOSCOPY WITH FULGERATION;  Surgeon: [name], MD;  Location: [facility];  Service: Urology;  Laterality: N/A;  . CYSTOSCOPY WITH URETHRAL DILATATION N/A 01/03/2015   Procedure: CYSTOSCOPY WITH URETHRAL DILATATION;  Surgeon: [name], MD;  Location: [facility];  Service: Urology;  Laterality: N/A;  . DOPPLER ECHOCARDIOGRAPHY  10/18/2004   EF 55-65%,BORDERLINE -enlarged aortic root,mild aortic insuff.,trace tricus.insuff.  [name] Kitchen FINGER SURGERY Left 03-17-2001   dr Burney Gauze  @[facility]    repair complex laceration injury 5th digit  . [place]  . NM MYOVIEW LTD  12/16/2013   INTERMEDIATE RISK test with small sized reversible perfusion  defect in the apical anterolateral wall -- most likely consistent with known occlusion of D1 with collaterals.  . shoulder surgery Right 1988  . TONSILLECTOMY  as teen  . TRANSTHORACIC ECHOCARDIOGRAM  April 2015   EF 35-40%, mild AI, moderately dilated ascending aorta  . TRANSURETHRAL RESECTION OF PROSTATE N/A 07/12/2014   Procedure: TRANSURETHRAL RESECTION OF THE PROSTATE (TURP) WITH MITOMYCIN -C;  Surgeon: [name], MD;  Location: [facility];  Service: Urology;  Laterality: N/A;        Home Medications    Prior to Admission medications   Medication Sig Start Date End Date Taking? Authorizing Provider  atorvastatin (LIPITOR) 80 MG tablet Take 80 mg by mouth at bedtime.     [provider]  clopidogrel (PLAVIX) 75 MG tablet Take 1 tablet (75 mg total) by mouth daily. 02/19/19   [name], MD  clopidogrel (PLAVIX) 75 MG tablet Take 1 tablet (75 mg total) by mouth daily. 02/19/19  [name], MD  fish oil-omega-3 fatty acids 1000 MG capsule Take 1 g by mouth at bedtime.     [provider]  metoprolol tartrate (LOPRESSOR) 25 MG tablet TAKE 1/2 TABLET BY MOUTH TWICE DAILY 02/26/19   [name], MD  Multiple Vitamin (MULTIVITAMIN) capsule Take 1 capsule by mouth at bedtime.     [provider]    Family History Family History  Problem Relation Age of Onset  . Heart Problems Mother   . Parkinson's disease Father   . Heart Problems Father     Social History Social History   Tobacco Use  . Smoking status: Former Smoker    Packs/day: 1.00    Years: 5.00    Pack years: 5.00    Types: Cigarettes    Quit date: 09/02/1957    Years since quitting: 61.5  . Smokeless tobacco: Never Used  Substance Use Topics  . Alcohol use: Never    Alcohol/week: 0.0 standard drinks    Frequency: Never  . Drug use: Never     Allergies   Patient has no known allergies.   Review of Systems Review of Systems  Constitutional: Positive for chills and  fever.  HENT: Negative for congestion, rhinorrhea and sore throat.   Eyes: Negative for visual disturbance.  Respiratory: Negative for cough and shortness of breath.   Cardiovascular: Negative for chest pain.  Gastrointestinal: Negative for abdominal pain, blood in stool, constipation, diarrhea, nausea and vomiting.  Genitourinary: Negative for dysuria and frequency.  Musculoskeletal: Negative for arthralgias and myalgias.  Skin: Negative for color change and rash.  Neurological: Negative for dizziness, syncope, light-headedness and headaches.     Physical Exam Updated Vital Signs BP 118/65 (BP Location: Right Arm)   Pulse 90   Temp (!) 103.5 F (39.7 C) (Rectal)   Resp (!) 28   SpO2 95%   Physical Exam Vitals signs and nursing note reviewed.  Constitutional:      General: He is not in acute distress.    Appearance: Normal appearance. He is well-developed and normal weight. He is not diaphoretic.  HENT:     Head: Normocephalic and atraumatic.     Nose: Nose normal.     Mouth/Throat:     Mouth: Mucous membranes are moist.     Pharynx: Oropharynx is clear.  Eyes:     General:        Right eye: No discharge.        Left eye: No discharge.     Pupils: Pupils are equal, round, and reactive to light.  Neck:     Musculoskeletal: Neck supple.  Cardiovascular:     Rate and Rhythm: Normal rate and regular rhythm.     Heart sounds: Normal heart sounds. No murmur. No friction rub. No gallop.   Pulmonary:     Effort: Pulmonary effort is normal. No respiratory distress.     Breath sounds: Normal breath sounds. No wheezing or rales.     Comments: Respirations equal and unlabored, patient able to speak in full sentences, lungs clear to auscultation bilaterally Abdominal:     General: Bowel sounds are normal. There is no distension.     Palpations: Abdomen is soft. There is no mass.     Tenderness: There is no abdominal tenderness. There is no guarding.     Comments: Abdomen soft,  nondistended, nontender to palpation in all quadrants without guarding or peritoneal signs  Musculoskeletal:  General: No deformity.  Skin:    General: Skin is warm and dry.     Capillary Refill: Capillary refill takes less than 2 seconds.  Neurological:     Mental Status: He is alert and oriented to person, place, and time.     Coordination: Coordination normal.     Comments: Speech is clear, able to follow commands Moves extremities without ataxia, coordination intact  Psychiatric:        Mood and Affect: Mood normal.        Behavior: Behavior normal.      ED Treatments / Results  Labs (all labs ordered are listed, but only abnormal results are displayed) Labs Reviewed  LACTIC ACID, PLASMA - Abnormal; Notable for the following components:      Result Value   Lactic Acid, Venous 3.4 (*)    All other components within normal limits  LACTIC ACID, PLASMA - Abnormal; Notable for the following components:   Lactic Acid, Venous 4.6 (*)    All other components within normal limits  COMPREHENSIVE METABOLIC PANEL - Abnormal; Notable for the following components:   Potassium 3.4 (*)    Glucose, Bld 201 (*)    Albumin 3.2 (*)    Total Bilirubin 1.6 (*)    All other components within normal limits  CBC WITH DIFFERENTIAL/PLATELET - Abnormal; Notable for the following components:   WBC 2.9 (*)    RBC 3.61 (*)    Hemoglobin 11.6 (*)    HCT 35.7 (*)    Platelets 116 (*)    Lymphs Abs 0.2 (*)    Monocytes Absolute 0.0 (*)    All other components within normal limits  URINALYSIS, ROUTINE W REFLEX MICROSCOPIC - Abnormal; Notable for the following components:   APPearance CLOUDY (*)    Hgb urine dipstick SMALL (*)    Protein, ur 30 (*)    Nitrite POSITIVE (*)    Leukocytes,Ua LARGE (*)    WBC, UA >50 (*)    Bacteria, UA FEW (*)    All other components within normal limits  SARS CORONAVIRUS 2 (HOSPITAL ORDER, [facility])  CULTURE, BLOOD (ROUTINE X 2)   CULTURE, BLOOD (ROUTINE X 2)  URINE CULTURE  APTT  PROTIME-INR  FERRITIN  C-REACTIVE PROTEIN  LACTATE DEHYDROGENASE  PROCALCITONIN  TRIGLYCERIDES  D-DIMER, QUANTITATIVE (NOT AT [facility])  FIBRINOGEN    EKG None  Radiology Dg Chest Port 1 View  Result Date: 03/23/2019 CLINICAL DATA:  Code sepsis EXAM: PORTABLE CHEST 1 VIEW COMPARISON:  11/22/2015 FINDINGS: Normal heart size. Stable aortic tortuosity when accounting for rotation. CABG. There is no edema, consolidation, effusion, or pneumothorax. Remote left rib fractures. Advanced glenohumeral osteoarthritis on the right IMPRESSION: Negative for pneumonia. Electronically Signed   By: Monte Fantasia M.D.   On: 03/23/2019 04:58    Procedures .Critical Care Performed by: Jacqlyn Larsen, PA-C Authorized by: Jacqlyn Larsen, PA-C   Critical care provider statement:    Critical care time (minutes):  45   Critical care was necessary to treat or prevent imminent or life-threatening deterioration of the following conditions:  Sepsis   Critical care was time spent personally by me on the following activities:  Discussions with consultants, evaluation of patient's response to treatment, examination of patient, ordering and performing treatments and interventions, ordering and review of laboratory studies, ordering and review of radiographic studies, pulse oximetry, re-evaluation of patient's condition, obtaining history from patient or surrogate and review of old charts   (  including critical care time)  Medications Ordered in ED Medications  vancomycin (VANCOCIN) 1,500 mg in sodium chloride 0.9 % 500 mL IVPB (1,500 mg Intravenous New Bag/Given 03/23/19 0551)  ceFEPIme (MAXIPIME) 2 g in sodium chloride 0.9 % 100 mL IVPB (0 g Intravenous Stopped 03/23/19 0544)  metroNIDAZOLE (FLAGYL) IVPB 500 mg (0 mg Intravenous Stopped 03/23/19 0544)  acetaminophen (TYLENOL) tablet 1,000 mg (1,000 mg Oral Given 03/23/19 0548)  sodium chloride 0.9 % bolus 1,000  mL (1,000 mLs Intravenous New Bag/Given 03/23/19 7517)     Initial Impression / Assessment and Plan / ED Course  I have reviewed the triage vital signs and the nursing notes.  Pertinent labs & imaging results that were available during my care of the patient were reviewed by me and considered in my medical decision making (see chart for details).  Patient presents for evaluation of fevers, denies any associated symptoms.  On arrival fever of 103.5, mildly tachypneic but all other vitals normal.  Lungs clear on exam and abdomen is benign.  Code sepsis initiated, and patient started on broad-spectrum antibiotics for unknown source, coronavirus test ordered as well.  Patient had initially denied any sick contacts but later it was determined that patient had tested positive for corona virus a few weeks ago although I am unable to find any documentation of this.  Given concern that coronavirus may be the cause of symptoms case was discussed with Dr. Randal Buba who recommended holding off on IV fluids at this time.  Code sepsis labs ordered as well as inflammatory markers for COVID.  Patient's chest x-ray is clear with no evidence of pneumonia or other active cardiopulmonary disease.  Patient with leukopenia with white count of 2.9, hemoglobin of 11.6.  Glucose of 201 but no other significant electrolyte derangements, normal renal and liver function.  Initial lactic acid of 3.4.  Blood cultures pending.  Coronavirus test is negative although there is still some concern for COVID especially given story of recent positive test per patient.  Urinalysis returned concerning for UTI with positive nitrites and leukocytes, bacteria and white blood cell clumps.  Patient is already receiving IV antibiotics.  Second lactic acid of 4.6 given source of infection other than coronavirus will give IV fluids, discussed with Dr. Nicholes Stairs will start with 1 L IV fluids given that patient has not had echocardiogram in 5 years, is  elderly and unknown heart function.  Inflammatory markers for coronavirus pending.  Patient will require admission for sepsis from urinary tract infection.  Vitals remaining stable.  Consult placed to hospitalist.  Case discussed with Dr. Doristine Bosworth with triad hospitalists, who will see and admit the patient.  Final Clinical Impressions(s) / ED Diagnoses   Final diagnoses:  Sepsis without acute organ dysfunction, due to unspecified organism [facility])  Acute cystitis without hematuria    ED Discharge Orders    None       Janet Berlin 03/23/19 0017    Palumbo, April, MD 03/24/19 0026    Jacqlyn Larsen, PA-C 04/26/19 1451    Palumbo, April, MD 04/27/19 2304

## 2019-03-23 NOTE — Progress Notes (Signed)
PHARMACY - PHYSICIAN COMMUNICATION CRITICAL VALUE ALERT - BLOOD CULTURE IDENTIFICATION (BCID)  Tanner [name] is an 85 y.o. male who presented to [facility] on 03/23/2019 with a chief complaint of fever/weakness  Assessment:  Per BCID, [place]  Name of physician (or Provider) Contacted: n/a  Current antibiotics: Rocephin 1g q24  Changes to prescribed antibiotics recommended:  Change Rocephin to 2g IV q24  Results for orders placed or performed during the hospital encounter of 03/23/19  Blood Culture ID Panel (Reflexed) (Collected: 03/23/2019  4:26 AM)  Result Value Ref Range   Enterococcus species NOT DETECTED NOT DETECTED   Listeria monocytogenes NOT DETECTED NOT DETECTED   Staphylococcus species NOT DETECTED NOT DETECTED   Staphylococcus aureus (BCID) NOT DETECTED NOT DETECTED   Streptococcus species NOT DETECTED NOT DETECTED   Streptococcus agalactiae NOT DETECTED NOT DETECTED   Streptococcus pneumoniae NOT DETECTED NOT DETECTED   Streptococcus pyogenes NOT DETECTED NOT DETECTED   Acinetobacter baumannii NOT DETECTED NOT DETECTED   Enterobacteriaceae species DETECTED (A) NOT DETECTED   Enterobacter cloacae complex NOT DETECTED NOT DETECTED   Escherichia coli DETECTED (A) NOT DETECTED   Klebsiella oxytoca NOT DETECTED NOT DETECTED   Klebsiella pneumoniae NOT DETECTED NOT DETECTED   Proteus species NOT DETECTED NOT DETECTED   Serratia marcescens NOT DETECTED NOT DETECTED   Carbapenem resistance NOT DETECTED NOT DETECTED   Haemophilus influenzae NOT DETECTED NOT DETECTED   Neisseria meningitidis NOT DETECTED NOT DETECTED   Pseudomonas aeruginosa NOT DETECTED NOT DETECTED   Candida albicans NOT DETECTED NOT DETECTED   Candida glabrata NOT DETECTED NOT DETECTED   Candida krusei NOT DETECTED NOT DETECTED   Candida parapsilosis NOT DETECTED NOT DETECTED   Candida tropicalis NOT DETECTED NOT DETECTED    [name] 03/23/2019  8:07 PM

## 2019-03-23 NOTE — Progress Notes (Signed)
TOC ED CM reviewed. Pt is from [facility], a private living community that provides multiple layers of services for the residents. If SNF for rehab is needed. [facility] may have bed availability. [facility] CM/CSW will follow for dc needs. Jonnie Finner RN CCM Case Mgmt phone [phone]

## 2019-03-23 NOTE — ED Notes (Signed)
MD Provider at bedside. 

## 2019-03-23 NOTE — Progress Notes (Signed)
A consult was received from an ED physician for cefepime and vancomycin per pharmacy dosing.  The patient's profile has been reviewed for ht/wt/allergies/indication/available labs.   A one time order has been placed for Cefepime 2 Gm and Vancomycin 1500 mg.  Further antibiotics/pharmacy consults should be ordered by admitting physician if indicated.                       Thank you, Dorrene German 03/23/2019  4:43 AM

## 2019-03-23 NOTE — H&P (Addendum)
History and Physical    Tanner [name] KGM:[id number] DOB: [birth year] DOA: 03/23/2019  PCP: Deland Pretty, MD  Patient coming from: Assisted living facility  I have personally briefly reviewed patient's old medical records in [place]  Chief Complaint: Fever/generalized weakness  HPI: Tanner [name] is a 85 y.o. male with medical history significant of benign essential tremors, CAD status post percutaneous coronary angioplasty on Plavix, hyperlipidemia and couple other comorbidities was sent to the emergency department from assisted living facility due to having fever of 104 and generalized weakness.  According to patient, he started having fever and generalized weakness just yesterday.  Temperature was 104 at assisted living facility.  He denies having had any other complaint such as dizziness, headache, chest pain, cough, shortness of breath, chills, sweating, any problem with urination or with bowel movement.  No recent sick contact or any recent travel.  According to ED physician, they were reported that patient was tested positive for COVID-19 2 or 3 weeks ago however he has been walking around, shopping around and meeting other people with no quarantine at all.  ED Course: Upon arrival to the emergency department, his initial blood pressure was within normal limits which soon fell to 99/57.  His temperature was 103.5 and he was tachypneic at 28.  Oxygen saturation was 93% on room air.CBC showed chronic pancytopenia.  CMP showed hyperglycemia with sugar of 201 and hypokalemia with 3.4.  Lactic acid 3.4, repeat 4.6 without any IV fluids.  UA showed positive nitrites and leukoesterase and cloudy appearance.  According to the report, due to the history that was given to the ED physician about him being positive for COVID-19, initial suspicion was that his symptoms are likely secondary to that so ED work-up was directed towards COVID-19 infection so he ended up having all the inflammatory  markers and had slightly elevated CRP and d-dimer however LDH, ferritin as well as prolactin were within normal range.  He was tested negative for COVID-19.  Blood culture and urine culture were drawn.  He was given some IV fluids.  Chest x-ray was unremarkable as well.  He was diagnosed with sepsis secondary to UTI and hospital service was requested to admit the patient.  Review of Systems: As per HPI otherwise negative.    Past Medical History:  Diagnosis Date  . Anticoagulant long-term use    plavix  (for hx TIA)  . Arthritis    right shoulder   . Benign essential tremor    hands -- occasional  . CAD S/P percutaneous coronary angioplasty cardiologist-- dr harding   a) PCI and BMS to LAD in 1991; b) CATH 10/2010: Severe 2 Vessel disease = diffuse RCA ( 75% prox, 90% mid & 70 + 75% distal) & LAD (80-90% pre-stent, 75% post-stent) with occluded D1 (filled via OM-D1 collaterals)  -- CABG x 2; c) Myoview 4/'15: INTERMEDIATE RISK - small sized reversible apical/anterolateral defect -- most likely c/w known occlusion of D1 with collaterals.(med Rx)  . History of basal cell carcinoma (BCC) excision   . History of bladder cancer urologist-- dr eskridge   dx 11/ 2015-- s/p TURBT  . History of kidney stones   . History of TIA (transient ischemic attack) 12/09/2013  . History of urethral stricture   . Hyperlipidemia with target LDL less than 70    controlled.  . Neuropathy of lower extremity   . Nocturia   . Osteoporosis   . Peripheral neuropathy    lower extremity  .  Postoperative atrial fibrillation ([place]) 10/2010   after bypass-"shocked back in and no problems since":   per cardiologist note, dr harding, no recurrence  . S/P CABG x 2 10/18/2010   LIMA-LAD, SVG to RPDA (Dr. [name])  . TGA (transient global amnesia)    "x3 for hours then goes away"  . Transient Dilated idiopathic cardiomyopathy April 2015; 04/2014   a) in setting of TIA: EF 35-40% (reduced from 55-65 percent) mild AI, moderate  and dilated aorta.;; b) Recheck Echo 04/2014: EF 55-60%, Gr 1 DD, mild-mod AI.    [name] Kitchen Wears glasses   . Wears partial dentures    upper    Past Surgical History:  Procedure Laterality Date  . APPENDECTOMY  as teen  . BASAL CELL CARCINOMA EXCISION  07/2014  . CARDIAC CATHETERIZATION  10/17/2010   80%prox w/mild in-stent restenosis w/75% mid-LADstenosis aft the stent,multi severe stenoses RCAw/heavily calcified vessel,norm LV  . CARDIOVERSION  2012   after bypass surgery  . CATARACT EXTRACTION W/ INTRAOCULAR LENS  IMPLANT, BILATERAL  2009  approx.  . COCCYX REMOVAL    . CORONARY ANGIOPLASTY WITH STENT PLACEMENT  1991   PCI and BMS to LAD  . CORONARY ARTERY BYPASS GRAFT  10-18-2010  dr Ricard Dillon @[facility]    LIMA-LAD, SVG-rPDA  . CYSTOSCOPY W/ RETROGRADES N/A 07/12/2014   Procedure: CYSTOSCOPY WITH RETROGRADE PYELOGRAM;  Surgeon: [name], MD;  Location: [facility];  Service: Urology;  Laterality: N/A;  . CYSTOSCOPY WITH BIOPSY N/A 09/09/2014   Procedure: CYSTO WITH BIOPSY AND FULGERATION, DILATION OF URETHRAL STRICTURE;  Surgeon: [name], MD;  Location: [facility];  Service: Urology;  Laterality: N/A;  BLADDER BIOPSY  . CYSTOSCOPY WITH BIOPSY N/A 01/03/2015   Procedure: CYSTOSCOPY WITH BIOPSY;  Surgeon: [name], MD;  Location: [facility];  Service: Urology;  Laterality: N/A;  . CYSTOSCOPY WITH BIOPSY N/A 01/26/2019   Procedure: CYSTOSCOPY WITH BIOPSY, BILATERAL RETROGRADE PYELOGRAM;  Surgeon: [name], MD;  Location: [facility];  Service: Urology;  Laterality: N/A;  . CYSTOSCOPY WITH FULGERATION N/A 01/26/2019   Procedure: CYSTOSCOPY WITH FULGERATION;  Surgeon: [name], MD;  [facility]  Service: Urology;  Laterality: N/A;  . CYSTOSCOPY WITH URETHRAL DILATATION N/A 01/03/2015   Procedure: CYSTOSCOPY WITH URETHRAL DILATATION;  Surgeon: [name], MD;  Location: [facility];  Service: Urology;  Laterality: N/A;  . DOPPLER  ECHOCARDIOGRAPHY  10/18/2004   EF 55-65%,BORDERLINE -enlarged aortic root,mild aortic insuff.,trace tricus.insuff.  [name] Kitchen FINGER SURGERY Left 03-17-2001   dr Burney Gauze  @[facility]    repair complex laceration injury 5th digit  . [place]  . NM MYOVIEW LTD  12/16/2013   INTERMEDIATE RISK test with small sized reversible perfusion defect in the apical anterolateral wall -- most likely consistent with known occlusion of D1 with collaterals.  . shoulder surgery Right 1988  . TONSILLECTOMY  as teen  . TRANSTHORACIC ECHOCARDIOGRAM  April 2015   EF 35-40%, mild AI, moderately dilated ascending aorta  . TRANSURETHRAL RESECTION OF PROSTATE N/A 07/12/2014   Procedure: TRANSURETHRAL RESECTION OF THE PROSTATE (TURP) WITH MITOMYCIN -C;  Surgeon: [name], MD;  Location: [facility];  Service: Urology;  Laterality: N/A;     reports that he quit smoking about 61 years ago. His smoking use included cigarettes. He has a 5.00 pack-year smoking history. He has never used smokeless tobacco. He reports that he does not drink alcohol or use drugs.  No Known Allergies  Family History  Problem Relation  Age of Onset  . Heart Problems Mother   . Parkinson's disease Father   . Heart Problems Father     Prior to Admission medications   Medication Sig Start Date End Date Taking? Authorizing Provider  atorvastatin (LIPITOR) 80 MG tablet Take 80 mg by mouth at bedtime.     [provider]  clopidogrel (PLAVIX) 75 MG tablet Take 1 tablet (75 mg total) by mouth daily. 02/19/19   [name], MD  clopidogrel (PLAVIX) 75 MG tablet Take 1 tablet (75 mg total) by mouth daily. 02/19/19   [name], MD  fish oil-omega-3 fatty acids 1000 MG capsule Take 1 g by mouth at bedtime.     [provider]  metoprolol tartrate (LOPRESSOR) 25 MG tablet TAKE 1/2 TABLET BY MOUTH TWICE DAILY 02/26/19   [name], MD  Multiple Vitamin (MULTIVITAMIN) capsule Take 1 capsule by mouth at bedtime.      [provider]    Physical Exam: Vitals:   03/23/19 0507 03/23/19 0551 03/23/19 0640  BP: 118/65 110/69 (!) 99/57  Pulse: 90 88 75  Resp: (!) 28 (!) 26 (!) 22  Temp: (!) 103.5 F (39.7 C)  98.9 F (37.2 C)  TempSrc: Rectal Oral Oral  SpO2: 95% 95% 94%    Constitutional: NAD, calm, comfortable Vitals:   03/23/19 0507 03/23/19 0551 03/23/19 0640  BP: 118/65 110/69 (!) 99/57  Pulse: 90 88 75  Resp: (!) 28 (!) 26 (!) 22  Temp: (!) 103.5 F (39.7 C)  98.9 F (37.2 C)  TempSrc: Rectal Oral Oral  SpO2: 95% 95% 94%   Eyes: PERRL, lids and conjunctivae normal ENMT: Mucous membranes are moist. Posterior pharynx clear of any exudate or lesions.Normal dentition.  Neck: normal, supple, no masses, no thyromegaly Respiratory: clear to auscultation bilaterally, no wheezing, no crackles. Normal respiratory effort. No accessory muscle use.  Cardiovascular: Regular rate and rhythm, no murmurs / rubs / gallops. No extremity edema. 2+ pedal pulses. No carotid bruits.  Abdomen: no tenderness, no masses palpated. No hepatosplenomegaly. Bowel sounds positive.  Musculoskeletal: no clubbing / cyanosis. No joint deformity upper and lower extremities. Good ROM, no contractures. Normal muscle tone.  Skin: no rashes, lesions, ulcers. No induration Neurologic: CN 2-12 grossly intact. Sensation intact, DTR normal. Strength 5/5 in all 4.  Psychiatric: Normal judgment and insight. Alert and oriented x 3. Normal mood.    Labs on Admission: I have personally reviewed following labs and imaging studies  CBC: Recent Labs  Lab 03/23/19 0421  WBC 2.9*  NEUTROABS 2.6  HGB 11.6*  HCT 35.7*  MCV 98.9  PLT 035*   Basic Metabolic Panel: Recent Labs  Lab 03/23/19 0421  NA 138  K 3.4*  CL 103  CO2 23  GLUCOSE 201*  BUN 22  CREATININE 0.89  CALCIUM 8.9   GFR: CrCl cannot be calculated (Unknown ideal weight.). Liver Function Tests: Recent Labs  Lab 03/23/19 0421  AST 39  ALT 30   ALKPHOS 70  BILITOT 1.6*  PROT 7.0  ALBUMIN 3.2*   No results for input(s): LIPASE, AMYLASE in the last 168 hours. No results for input(s): AMMONIA in the last 168 hours. Coagulation Profile: Recent Labs  Lab 03/23/19 0421  INR 1.2   Cardiac Enzymes: No results for input(s): CKTOTAL, CKMB, CKMBINDEX, TROPONINI in the last 168 hours. BNP (last 3 results) No results for input(s): PROBNP in the last 8760 hours. HbA1C: No results for input(s): HGBA1C in the last  72 hours. CBG: No results for input(s): GLUCAP in the last 168 hours. Lipid Profile: Recent Labs    03/23/19 0621  TRIG 53   Thyroid Function Tests: No results for input(s): TSH, T4TOTAL, FREET4, T3FREE, THYROIDAB in the last 72 hours. Anemia Panel: Recent Labs    03/23/19 0539  FERRITIN 283   Urine analysis:    Component Value Date/Time   COLORURINE YELLOW 03/23/2019 0421   APPEARANCEUR CLOUDY (A) 03/23/2019 0421   LABSPEC 1.015 03/23/2019 0421   PHURINE 5.0 03/23/2019 0421   GLUCOSEU NEGATIVE 03/23/2019 0421   HGBUR SMALL (A) 03/23/2019 0421   BILIRUBINUR NEGATIVE 03/23/2019 0421   KETONESUR NEGATIVE 03/23/2019 0421   PROTEINUR 30 (A) 03/23/2019 0421   UROBILINOGEN 0.2 04/22/2014 1515   NITRITE POSITIVE (A) 03/23/2019 0421   LEUKOCYTESUR LARGE (A) 03/23/2019 0421    Radiological Exams on Admission: Dg Chest Port 1 View  Result Date: 03/23/2019 CLINICAL DATA:  Code sepsis EXAM: PORTABLE CHEST 1 VIEW COMPARISON:  11/22/2015 FINDINGS: Normal heart size. Stable aortic tortuosity when accounting for rotation. CABG. There is no edema, consolidation, effusion, or pneumothorax. Remote left rib fractures. Advanced glenohumeral osteoarthritis on the right IMPRESSION: Negative for pneumonia. Electronically Signed   By: Monte Fantasia M.D.   On: 03/23/2019 04:58    EKG: Not done  Assessment/Plan Active Problems:   Dyslipidemia, goal LDL below 70   Sinus bradycardia   Sepsis secondary to UTI ([place])    Hypokalemia   Prediabetes     Sepsis secondary to UTI: Patient received cefepime and vancomycin in the emergency department.  He is afebrile at this point in time and completely alert and oriented.  We will admit him and start him on IV Rocephin and follow urine as well as blood culture and tailor antibiotics accordingly.  Due to history of CAD and cardiomyopathy, I will also start him on IV fluids but being cautious I will only start him on 75 cc/h.  Lactic acidosis: Secondary to sepsis.  Gentle hydration as mentioned above.  Repeat lactic acid every 4 hours until normalized.  Elevated d-dimer: Likely secondary to sepsis.  No chest pain or shortness of breath however his heart rate is within normal limits whereas at baseline he has a history of bradycardia.  He also has fever.  Very slight suspicion of PE.  I would like to rule him out for that and will proceed with CT angiogram of the chest.  Hypokalemia: We will add potassium to his IV fluids.  Recheck labs in the morning.  Monitor on telemetry.  History of hypertension: He is only on metoprolol which I will resume while monitoring him very closely.  Continue IV fluids.  Hyperlipidemia: Resume home dose of a statin.  History of CAD status post stents: No chest pain or signs or symptoms of ACS.  Resume metoprolol and Plavix.  Not on any aspirin.  Prediabetes: Presents with hyper glycemia of 201 and last hemoglobin A1c in chart from April 2015 is 6.1 making him prediabetic.  Will check hemoglobin A1c.  He is not on any antidiabetic medications from home.  We will start him on sliding scale insulin.  DVT prophylaxis: Lovenox Code Status: Full code Family Communication: None present.  Patient alert and oriented and I discussed plan of care and admission with the patient in detail. Disposition Plan: Likely back to assisted living facility in 2 days. Consults called: None Admission status: Inpatient   Darliss Cheney MD [facility]  Pager [phone]  If 7PM-7AM, please contact night-coverage [email or web address] Password [email or web address]  03/23/2019, 8:20 AM

## 2019-03-23 NOTE — ED Triage Notes (Signed)
[facility], pt developed a high fever tonight, and started feeling weak and generalized stomach discomfort

## 2019-03-23 NOTE — ED Notes (Signed)
Gave report Jeanette Caprice, Therapist, sports at [phone].

## 2019-03-24 DIAGNOSIS — E118 Type 2 diabetes mellitus with unspecified complications: Secondary | ICD-10-CM

## 2019-03-24 DIAGNOSIS — R7881 Bacteremia: Secondary | ICD-10-CM

## 2019-03-24 LAB — COMPREHENSIVE METABOLIC PANEL
ALT: 32 U/L (ref 0–44)
AST: 44 U/L — ABNORMAL HIGH (ref 15–41)
Albumin: 2.8 g/dL — ABNORMAL LOW (ref 3.5–5.0)
Alkaline Phosphatase: 59 U/L (ref 38–126)
Anion gap: 8 (ref 5–15)
BUN: 20 mg/dL (ref 8–23)
CO2: 22 mmol/L (ref 22–32)
Calcium: 8.3 mg/dL — ABNORMAL LOW (ref 8.9–10.3)
Chloride: 108 mmol/L (ref 98–111)
Creatinine, Ser: 0.78 mg/dL (ref 0.61–1.24)
GFR calc Af Amer: >60 mL/min (ref >60)
GFR calc non Af Amer: >60 mL/min (ref >60)
Glucose, Bld: 126 mg/dL — ABNORMAL HIGH (ref 70–99)
Potassium: 3.8 mmol/L (ref 3.5–5.1)
Sodium: 138 mmol/L (ref 135–145)
Total Bilirubin: 1.5 mg/dL — ABNORMAL HIGH (ref 0.3–1.2)
Total Protein: 6.1 g/dL — ABNORMAL LOW (ref 6.5–8.1)

## 2019-03-24 LAB — GLUCOSE, CAPILLARY
Glucose-Capillary: 143 mg/dL — ABNORMAL HIGH (ref 70–99)
Glucose-Capillary: 158 mg/dL — ABNORMAL HIGH (ref 70–99)
Glucose-Capillary: 193 mg/dL — ABNORMAL HIGH (ref 70–99)
Glucose-Capillary: 172 mg/dL — ABNORMAL HIGH (ref 70–99)

## 2019-03-24 LAB — CBC
HCT: 31.7 % — ABNORMAL LOW (ref 39.0–52.0)
Hemoglobin: 10.1 g/dL — ABNORMAL LOW (ref 13.0–17.0)
MCH: 31.6 pg (ref 26.0–34.0)
MCHC: 31.9 g/dL (ref 30.0–36.0)
MCV: 99.1 fL (ref 80.0–100.0)
Platelets: 102 10*3/uL — ABNORMAL LOW (ref 150–400)
RBC: 3.2 MIL/uL — ABNORMAL LOW (ref 4.22–5.81)
RDW: 12.5 % (ref 11.5–15.5)
WBC: 7.9 10*3/uL (ref 4.0–10.5)
nRBC: 0 % (ref 0.0–0.2)

## 2019-03-24 LAB — CORTISOL-AM, BLOOD: Cortisol - AM: 14.9 ug/dL (ref 6.7–22.6)

## 2019-03-24 LAB — PROTIME-INR
INR: 1.3 — ABNORMAL HIGH (ref 0.8–1.2)
Prothrombin Time: 15.9 seconds — ABNORMAL HIGH (ref 11.4–15.2)

## 2019-03-24 LAB — PROCALCITONIN: Procalcitonin: 49.23 ng/mL

## 2019-03-24 MED ORDER — INSULIN ASPART 100 UNIT/ML ~~LOC~~ SOLN
0.0000 [IU] | Freq: Three times a day (TID) | SUBCUTANEOUS | Status: DC
  Administered 2019-03-24 – 2019-03-25 (×5): 2 [IU] via SUBCUTANEOUS
  Administered 2019-03-26: 1 [IU] via SUBCUTANEOUS
  Administered 2019-03-26 (×2): 2 [IU] via SUBCUTANEOUS
  Administered 2019-03-27: 1 [IU] via SUBCUTANEOUS
  Administered 2019-03-27 – 2019-03-28 (×3): 3 [IU] via SUBCUTANEOUS
  Administered 2019-03-28: 1 [IU] via SUBCUTANEOUS

## 2019-03-24 NOTE — Progress Notes (Signed)
Report received from previous RN, agree with assessment. Will monitor patient.

## 2019-03-24 NOTE — Progress Notes (Signed)
PROGRESS NOTE  Tanner [name] ZOX:[id number] DOB: 08-10-1924 DOA: 03/23/2019 PCP: Deland Pretty, MD  Brief History   The patient is a 85 yr old man who presented to the [facility] from an assisted living facility with complaints of fever and generalized weakness. In the ED he was found to have a UTI and sepsis. Blood cultures x 2 were drawn and have grown out E.colil as has his urine cultures. The patient is receiving IV Rocephin. Susceptibilities are pending.  Consultants  . None  Procedures  . None  Antibiotics   Anti-infectives (From admission, onward)   Start     Dose/Rate Route Frequency Ordered Stop   03/24/19 1800  cefTRIAXone (ROCEPHIN) 2 g in sodium chloride 0.9 % 100 mL IVPB     2 g 200 mL/hr over 30 Minutes Intravenous Every 24 hours 03/23/19 2007     03/23/19 1800  cefTRIAXone (ROCEPHIN) 1 g in sodium chloride 0.9 % 100 mL IVPB  Status:  Discontinued     1 g 200 mL/hr over 30 Minutes Intravenous Every 24 hours 03/23/19 1705 03/23/19 2007   03/23/19 0500  vancomycin (VANCOCIN) 1,500 mg in sodium chloride 0.9 % 500 mL IVPB     1,500 mg 250 mL/hr over 120 Minutes Intravenous  Once 03/23/19 0442 03/23/19 0848   03/23/19 0430  ceFEPIme (MAXIPIME) 2 g in sodium chloride 0.9 % 100 mL IVPB     2 g 200 mL/hr over 30 Minutes Intravenous  Once 03/23/19 0421 03/23/19 0544   03/23/19 0430  metroNIDAZOLE (FLAGYL) IVPB 500 mg     500 mg 100 mL/hr over 60 Minutes Intravenous  Once 03/23/19 0421 03/23/19 0544   03/23/19 0430  vancomycin (VANCOCIN) IVPB 1000 mg/200 mL premix  Status:  Discontinued     1,000 mg 200 mL/hr over 60 Minutes Intravenous  Once 03/23/19 0421 03/23/19 0442    .   Subjective  The patient is resting quietly. No new complaints.  Objective   Vitals:  Vitals:   03/24/19 0555 03/24/19 1235  BP: 132/73 (!) 142/84  Pulse: 74 72  Resp: 16   Temp: 99.7 F (37.6 C) 98.6 F (37 C)  SpO2: 94% 94%    Exam:  Constitutional:  . The patient is somnolent, but  arousable. No acute distress. Respiratory:  . CTA bilaterally, no w/r/r.  . Respiratory effort normal. No retractions or accessory muscle use Cardiovascular:  . RRR, no m/r/g . No LE extremity edema   . Normal pedal pulses Abdomen:  . Abdomen appears normal; no tenderness or masses . No hernias . No HSM Musculoskeletal:  . No cyanosis, clubbing or edema Skin:  . No rashes, lesions, ulcers . palpation of skin: no induration or nodules Neurologic:  Patient is somnolent and unable to cooperate with exam. Psychiatric: Patient is somnolent and unable to cooperate with exam.   I have personally reviewed the following:   Today's Data  . CMP, CBC, cortisol, INR  Micro Data  . Urine culture + for E. Coli . Blood Cultures x 2 + for E. coli  Scheduled Meds: . atorvastatin  80 mg Oral QHS  . clopidogrel  75 mg Oral Daily  . enoxaparin (LOVENOX) injection  40 mg Subcutaneous Q24H  . insulin aspart  0-5 Units Subcutaneous QHS  . insulin aspart  0-9 Units Subcutaneous TID WC  . metoprolol tartrate  12.5 mg Oral BID   Continuous Infusions: . 0.9 % NaCl with KCl 40 mEq / L Stopped (03/24/19 1757)  .  cefTRIAXone (ROCEPHIN)  IV 200 mL/hr at 03/24/19 1800    Active Problems:   Dyslipidemia, goal LDL below 70   Sinus bradycardia   Sepsis secondary to UTI ([place])   Hypokalemia   Prediabetes   LOS: 1 day    A & P   Sepsis: Upon admission based upon fever, leukocytosis, tachypnea, lactic acidosis, and  Hypotension. Resolved now after IV fluid resuscitation and IV antibiotics as per Sepsis protocol.  UTI: E. Coli has grown out of urine cultures. Susceptibilities are pending. Patient is on IV Rocephin  Bacteremia: Blood cultures x 2 have grown out E. Coli. Surveillance culture will be drawn. Patient is receiving IV Rocephin pending susceptibilities.  Elevated D dimer: Due to sepsis. Low suspicion for DVT confirmed by CTA chest.  Hypokalemia: Supplement and monitor.   Hypertension: Continue metoprolol as at home.  Hyperlipidemia: Home dose of statin is continued.  CAD: S/P stents. Continue metoprolol, statin, and plavix. No asa at home.  DM II: HbA1c is 7.5. Will start low dose of lantus and correction insulin. Monitor.  I have seen and examined this patient myself. I have spent 35 minutes in his evaluation and care.  DVT prophylaxis: Lovenox Code Status: Full Code Family Communication: None available Disposition Plan: tbd   Eliah Marquard, DO [facility] Direct contact: see [email or web address]  7PM-7AM contact night coverage as above 03/24/2019, 7:51 PM  LOS: 1 day

## 2019-03-24 NOTE — Progress Notes (Addendum)
Inpatient Diabetes Program Recommendations  AACE/ADA: New Consensus Statement on Inpatient Glycemic Control (2015)  Target Ranges:  Prepandial:   less than 140 mg/dL      Peak postprandial:   less than 180 mg/dL (1-2 hours)      Critically ill patients:  140 - 180 mg/dL    Results for Tanner [name], Tanner [name] (MRN [id number]) as of 03/24/2019 10:10  Ref. Range 03/23/2019 21:12 03/24/2019 07:29  Glucose-Capillary Latest Ref Range: 70 - 99 mg/dL 362 (H)  5 units NOVOLOG  143 (H)   Results for Tanner [name], Tanner [name] (MRN [id number]) as of 03/24/2019 10:10  Ref. Range 03/23/2019 17:17  Hemoglobin A1C Latest Ref Range: 4.8 - 5.6 % 7.5 (H)  (168 mg/dl)    Admit with: Sepsis secondary to UTI  History: Pre-Diabetes  Home DM Meds: None  Current Orders: Novolog Sensitive Correction Scale/ SSI (0-9 units) TID AC + HS     MD- Note Current A1c elevated to 7.5%.  Is this a new diagnosis of Diabetes?  If so, please address with patient and then the DM Coordinator will speak with pt about diabetes care at home.    Addendum 1:20pm- Met with pt today to discuss current A1c of 7.5%, why we are checking CBGs and giving insulin, etc.  Pt told me he has lived at [facility] for 16 years with his wife.  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  Told me he was told his sugars were a little high and also told me that they feed the residents really well at [facility] and he probably eats too much.  I explained why we are checking his CBGs and why we are giving insulin.  Pt OK with all the treatments he is getting.  Eager to get back home.  Discussed with pt the importance of limiting sweets and avoiding drinks with sugar.  Given pt's age and risk for Hypoglycemia with oral DM meds, do not recommend starting oral DM meds at time of d/c.  If you decide to start something, would choose an oral medication with very low risk of Hypoglycemia like Tradjenta 5 mg Daily.      --Will follow patient during  hospitalization--  Wyn Quaker RN, MSN, CDE Diabetes Coordinator Inpatient Glycemic Control Team Team Pager: [phone] (8a-5p)

## 2019-03-25 LAB — GLUCOSE, CAPILLARY
Glucose-Capillary: 184 mg/dL — ABNORMAL HIGH (ref 70–99)
Glucose-Capillary: 196 mg/dL — ABNORMAL HIGH (ref 70–99)
Glucose-Capillary: 190 mg/dL — ABNORMAL HIGH (ref 70–99)
Glucose-Capillary: 163 mg/dL — ABNORMAL HIGH (ref 70–99)

## 2019-03-25 LAB — CULTURE, BLOOD (ROUTINE X 2): Special Requests: ADEQUATE

## 2019-03-25 LAB — URINE CULTURE: Culture: 2000 — AB

## 2019-03-25 NOTE — Progress Notes (Signed)
Pt from [place]. Raynelle Chary called [phone] from [place] to check on pt's discharge plan. Returned call to [place], left VM, waiting for PT to Eval.

## 2019-03-25 NOTE — Progress Notes (Signed)
PROGRESS NOTE  Tanner [name] PNT:[id number] DOB: Apr 24, 1924 DOA: 03/23/2019 PCP: Deland Pretty, MD  Brief History   The patient is a 85 yr old man who presented to the [facility] from an assisted living facility with complaints of fever and generalized weakness. In the ED he was found to have a UTI and sepsis. Blood cultures x 2 were drawn and have grown out E.colil as has his urine cultures. The patient is receiving IV Rocephin. Susceptibilities are pending. Surveillance cultures have been drawn.  Consultants  . None  Procedures  . None  Antibiotics   Anti-infectives (From admission, onward)   Start     Dose/Rate Route Frequency Ordered Stop   03/24/19 1800  cefTRIAXone (ROCEPHIN) 2 g in sodium chloride 0.9 % 100 mL IVPB     2 g 200 mL/hr over 30 Minutes Intravenous Every 24 hours 03/23/19 2007     03/23/19 1800  cefTRIAXone (ROCEPHIN) 1 g in sodium chloride 0.9 % 100 mL IVPB  Status:  Discontinued     1 g 200 mL/hr over 30 Minutes Intravenous Every 24 hours 03/23/19 1705 03/23/19 2007   03/23/19 0500  vancomycin (VANCOCIN) 1,500 mg in sodium chloride 0.9 % 500 mL IVPB     1,500 mg 250 mL/hr over 120 Minutes Intravenous  Once 03/23/19 0442 03/23/19 0848   03/23/19 0430  ceFEPIme (MAXIPIME) 2 g in sodium chloride 0.9 % 100 mL IVPB     2 g 200 mL/hr over 30 Minutes Intravenous  Once 03/23/19 0421 03/23/19 0544   03/23/19 0430  metroNIDAZOLE (FLAGYL) IVPB 500 mg     500 mg 100 mL/hr over 60 Minutes Intravenous  Once 03/23/19 0421 03/23/19 0544   03/23/19 0430  vancomycin (VANCOCIN) IVPB 1000 mg/200 mL premix  Status:  Discontinued     1,000 mg 200 mL/hr over 60 Minutes Intravenous  Once 03/23/19 0421 03/23/19 0442      Subjective  The patient is resting quietly. No new complaints.  Objective   Vitals:  Vitals:   03/25/19 0444 03/25/19 1202  BP: (!) 160/92 138/88  Pulse: 71 90  Resp: 20 18  Temp: 98.2 F (36.8 C) 98.9 F (37.2 C)  SpO2: 94% 96%    Exam:   Constitutional:  . The patient is resting comfortably. No acute distress. Respiratory:  . No increased work of breathing. . No wheezes, rales, or rhonchi. . No tactile fremitus. Cardiovascular:  . Irregular rate and rhythm. . No murmurs, ectopy, or gallups. . No lateral PMI. No thrills. Abdomen:  . Abdomen is soft, non-tender, non-distended. . No hernias, masses, or organomegaly. . Normoactive bowel sounds.  Musculoskeletal:  . No cyanosis, clubbing or edema Skin:  . No rashes, lesions, ulcers . palpation of skin: no induration or nodules Neurologic:  Patient is moving all extremities. CN II - XIi grossly intact. [name] Kitchen Psychiatric: Mood and affect are congruent.   I have personally reviewed the following:   Today's Data  . CMP, CBC, INR  Micro Data  . Urine culture + for E. Coli sensitive to Rocephin. . Blood Cultures x 2 + for E. Coli sensitive to Rocephin  Scheduled Meds: . atorvastatin  80 mg Oral QHS  . clopidogrel  75 mg Oral Daily  . enoxaparin (LOVENOX) injection  40 mg Subcutaneous Q24H  . insulin aspart  0-5 Units Subcutaneous QHS  . insulin aspart  0-9 Units Subcutaneous TID WC  . metoprolol tartrate  12.5 mg Oral BID   Continuous Infusions: .  0.9 % NaCl with KCl 40 mEq / L 75 mL/hr (03/25/19 1505)  . cefTRIAXone (ROCEPHIN)  IV 200 mL/hr at 03/24/19 1800    Active Problems:   Dyslipidemia, goal LDL below 70   Sinus bradycardia   Sepsis secondary to UTI ([place])   Hypokalemia   Prediabetes   LOS: 2 days    A & P   Sepsis: Upon admission based upon fever, leukocytosis, tachypnea, lactic acidosis, and  Hypotension. Resolved now after IV fluid resuscitation and IV antibiotics as per Sepsis protocol.  UTI: E. Coli has grown out of urine cultures. Susceptible to Rocephin.   Bacteremia: Blood cultures x 2 have grown out E. Coli. Susceptible to Rocephin. Surveillance cultureshave been drawn. Monitor. Will convert to Keflex if no growth in 2-3 days.   Elevated D dimer: Due to sepsis. Low suspicion for DVT confirmed by CTA chest.  Hypokalemia: Supplement and monitor.  Hypertension: Continue metoprolol as at home.  Hyperlipidemia: Home dose of statin is continued.  CAD: S/P stents. Continue metoprolol, statin, and plavix. No asa at home.  DM II: HbA1c is 7.5. Will start low dose of lantus and correction insulin. Monitor. Glucoses for the last 24 hours have been 158 - 196.  I have seen and examined this patient myself. I have spent 32 minutes in his evaluation and care.  DVT prophylaxis: Lovenox Code Status: Full Code Family Communication: None available Disposition Plan: tbd   Baylor Cortez, DO [facility] Direct contact: see [email or web address]  7PM-7AM contact night coverage as above 03/25/2019, 4:16 PM  LOS: 1 day

## 2019-03-26 LAB — CBC WITH DIFFERENTIAL/PLATELET
Abs Immature Granulocytes: 0.06 10*3/uL (ref 0.00–0.07)
Basophils Absolute: 0 10*3/uL (ref 0.0–0.1)
Basophils Relative: 0 %
Eosinophils Absolute: 0.2 10*3/uL (ref 0.0–0.5)
Eosinophils Relative: 2 %
HCT: 33.4 % — ABNORMAL LOW (ref 39.0–52.0)
Hemoglobin: 11 g/dL — ABNORMAL LOW (ref 13.0–17.0)
Immature Granulocytes: 1 %
Lymphocytes Relative: 10 %
Lymphs Abs: 0.9 10*3/uL (ref 0.7–4.0)
MCH: 32.2 pg (ref 26.0–34.0)
MCHC: 32.9 g/dL (ref 30.0–36.0)
MCV: 97.7 fL (ref 80.0–100.0)
Monocytes Absolute: 0.5 10*3/uL (ref 0.1–1.0)
Monocytes Relative: 6 %
Neutro Abs: 6.6 10*3/uL (ref 1.7–7.7)
Neutrophils Relative %: 81 %
Platelets: 130 10*3/uL — ABNORMAL LOW (ref 150–400)
RBC: 3.42 MIL/uL — ABNORMAL LOW (ref 4.22–5.81)
RDW: 12.6 % (ref 11.5–15.5)
WBC: 8.2 10*3/uL (ref 4.0–10.5)
nRBC: 0 % (ref 0.0–0.2)

## 2019-03-26 LAB — BASIC METABOLIC PANEL
Anion gap: 10 (ref 5–15)
BUN: 16 mg/dL (ref 8–23)
CO2: 20 mmol/L — ABNORMAL LOW (ref 22–32)
Calcium: 8.2 mg/dL — ABNORMAL LOW (ref 8.9–10.3)
Chloride: 105 mmol/L (ref 98–111)
Creatinine, Ser: 0.7 mg/dL (ref 0.61–1.24)
GFR calc Af Amer: >60 mL/min (ref >60)
GFR calc non Af Amer: >60 mL/min (ref >60)
Glucose, Bld: 168 mg/dL — ABNORMAL HIGH (ref 70–99)
Potassium: 4.1 mmol/L (ref 3.5–5.1)
Sodium: 135 mmol/L (ref 135–145)

## 2019-03-26 LAB — GLUCOSE, CAPILLARY
Glucose-Capillary: 139 mg/dL — ABNORMAL HIGH (ref 70–99)
Glucose-Capillary: 171 mg/dL — ABNORMAL HIGH (ref 70–99)
Glucose-Capillary: 165 mg/dL — ABNORMAL HIGH (ref 70–99)
Glucose-Capillary: 114 mg/dL — ABNORMAL HIGH (ref 70–99)

## 2019-03-26 MED ORDER — HYDRALAZINE HCL 20 MG/ML IJ SOLN
10.0000 mg | Freq: Four times a day (QID) | INTRAMUSCULAR | Status: DC | PRN
  Administered 2019-03-26: 10 mg via INTRAVENOUS
  Filled 2019-03-26 (×2): qty 1

## 2019-03-26 MED ORDER — METOPROLOL TARTRATE 25 MG PO TABS
25.0000 mg | ORAL_TABLET | Freq: Two times a day (BID) | ORAL | Status: DC
  Administered 2019-03-26 – 2019-03-28 (×5): 25 mg via ORAL
  Filled 2019-03-26 (×5): qty 1

## 2019-03-26 NOTE — Evaluation (Signed)
Occupational Therapy Evaluation Patient Details Name: Tanner [name] MRN: [id number] DOB: 03/02/1924 Today's Date: 03/26/2019    History of Present Illness 85 year old man admitted with sepsis secondary to UTI.  Presented with fever, generalized weakness. ER doc was told he tested + for COVID 2-3 weeks prior to admission.  Tested negative here.  PMH:  CAD with angioplasty, CABG, TIA and neuropathy   Clinical Impression   Pt was admitted for the above. At baseline, he is independent and enjoys fixing things for neighbors.  He and wife live in independent living at [facility].  Pt currently needs min A for balance without AD.  Hopefully, he will reach a mod I level and return home.  If not, may needs STSNF prior to home    Follow Up Recommendations  Home health OT;SNF(depending if he reaches mod I level)    Equipment Recommendations  None recommended by OT    Recommendations for Other Services       Precautions / Restrictions Precautions Precautions: Fall Restrictions Weight Bearing Restrictions: No      Mobility Bed Mobility               General bed mobility comments: oob  Transfers Overall transfer level: Needs assistance Equipment used: 1 person hand held assist Transfers: Sit to/from Stand Sit to Stand: Min assist         General transfer comment: steadying assistance    Balance                                           ADL either performed or assessed with clinical judgement   ADL Overall ADL's : Needs assistance/impaired                                       General ADL Comments: pt needs set up for UB adls and min A for sit to stand for LB adls, ambulating without a RW.  2/4 dyspnea after walking; sats 99% on RA     Vision         Perception     Praxis      Pertinent Vitals/Pain Pain Assessment: No/denies pain     Hand Dominance     Extremity/Trunk Assessment Upper Extremity Assessment Upper  Extremity Assessment: Overall WFL for tasks assessed           Communication Communication Communication: No difficulties   Cognition Arousal/Alertness: Awake/alert Behavior During Therapy: WFL for tasks assessed/performed Overall Cognitive Status: Within Functional Limits for tasks assessed                                     General Comments       Exercises     Shoulder Instructions      Home Living Family/patient expects to be discharged to:: (friends home Independent Living)                                 Additional Comments: lives with wife, bars in shower and by toilet.       Prior Functioning/Environment Level of Independence: Independent        Comments: pt  does not use AD; wife uses a walker.  He fixes things for neighbors.  Was an Chief Financial Officer        OT Problem List: Decreased strength;Decreased activity tolerance;Impaired balance (sitting and/or standing);Pain      OT Treatment/Interventions: Self-care/ADL training;Energy conservation;Therapeutic activities;Patient/family education;Balance training    OT Goals(Current goals can be found in the care plan section) Acute Rehab OT Goals Patient Stated Goal: home OT Goal Formulation: With patient Time For Goal Achievement: 04/09/19 Potential to Achieve Goals: Good ADL Goals Pt Will Transfer to Toilet: with modified independence;ambulating;grab bars Additional ADL Goal #1: pt will gather clothes and complete adl at mod I level  OT Frequency: Min 2X/week   Barriers to D/C:            Co-evaluation              AM-PAC OT "6 Clicks" Daily Activity     Outcome Measure Help from another person eating meals?: None Help from another person taking care of personal grooming?: A Little Help from another person toileting, which includes using toliet, bedpan, or urinal?: A Little Help from another person bathing (including washing, rinsing, drying)?: A Little Help from another  person to put on and taking off regular upper body clothing?: A Little Help from another person to put on and taking off regular lower body clothing?: A Little 6 Click Score: 19   End of Session    Activity Tolerance: Patient tolerated treatment well Patient left: in chair;with call bell/phone within reach;with chair alarm set  OT Visit Diagnosis: Unsteadiness on feet (R26.81)                Time: 2111-7356 OT Time Calculation (min): 14 min Charges:  OT General Charges $OT Visit: 1 Visit OT Evaluation $OT Eval Low Complexity: 1 Low  Lesle Chris, OTR/L Acute Rehabilitation Services [phone] [facility] pager [phone] office 03/26/2019  [place] 03/26/2019, 12:17 PM

## 2019-03-26 NOTE — Progress Notes (Addendum)
TOC CM received call from [place] from [place] [phone] ex [phone], requested update on disposition. She has availability in the [facility] for his and wanted to make her team aware if he dc over the weekend. Will notify Unit [facility] CM with request. Jonnie Finner RN CCM Case Mgmt phone [phone]

## 2019-03-26 NOTE — Care Management Important Message (Signed)
Important Message  Patient Details IM Letter given to [name] RN to present to the Patient Name: Tanner [name] MRN: [id number] Date of Birth: September 26, 1923   Medicare Important Message Given:  Yes     [name] 03/26/2019, 10:10 AM

## 2019-03-26 NOTE — Progress Notes (Signed)
PROGRESS NOTE  Tanner [name] NFA:[id number] DOB: 1924-07-12 DOA: 03/23/2019 PCP: Deland Pretty, MD  Brief History   The patient is a 85 yr old man who presented to the [facility] from an assisted living facility with complaints of fever and generalized weakness. In the ED he was found to have a UTI and sepsis. Blood cultures x 2 were drawn and have grown out E.colil as has his urine cultures. The patient is receiving IV Rocephin. Susceptibilities are pending. Surveillance cultures have been drawn on 03/25/2019. No growth as of yet.  Consultants  . None  Procedures  . None  Antibiotics   Anti-infectives (From admission, onward)   Start     Dose/Rate Route Frequency Ordered Stop   03/24/19 1800  cefTRIAXone (ROCEPHIN) 2 g in sodium chloride 0.9 % 100 mL IVPB     2 g 200 mL/hr over 30 Minutes Intravenous Every 24 hours 03/23/19 2007     03/23/19 1800  cefTRIAXone (ROCEPHIN) 1 g in sodium chloride 0.9 % 100 mL IVPB  Status:  Discontinued     1 g 200 mL/hr over 30 Minutes Intravenous Every 24 hours 03/23/19 1705 03/23/19 2007   03/23/19 0500  vancomycin (VANCOCIN) 1,500 mg in sodium chloride 0.9 % 500 mL IVPB     1,500 mg 250 mL/hr over 120 Minutes Intravenous  Once 03/23/19 0442 03/23/19 0848   03/23/19 0430  ceFEPIme (MAXIPIME) 2 g in sodium chloride 0.9 % 100 mL IVPB     2 g 200 mL/hr over 30 Minutes Intravenous  Once 03/23/19 0421 03/23/19 0544   03/23/19 0430  metroNIDAZOLE (FLAGYL) IVPB 500 mg     500 mg 100 mL/hr over 60 Minutes Intravenous  Once 03/23/19 0421 03/23/19 0544   03/23/19 0430  vancomycin (VANCOCIN) IVPB 1000 mg/200 mL premix  Status:  Discontinued     1,000 mg 200 mL/hr over 60 Minutes Intravenous  Once 03/23/19 0421 03/23/19 0442      Subjective  The patient is resting quietly. He complains that he became short of breath with walking to the bathroom.  Objective   Vitals:  Vitals:   03/26/19 0718 03/26/19 1228  BP: (!) 158/78 118/75  Pulse:  73  Resp:  18   Temp:  98.1 F (36.7 C)  SpO2:  98%    Exam:  Constitutional:  . The patient is resting comfortably. No acute distress. Respiratory:  . No increased work of breathing. . No wheezes, rales, or rhonchi. . No tactile fremitus. Cardiovascular:  . Irregular rate and rhythm. . No murmurs, ectopy, or gallups. . No lateral PMI. No thrills. Abdomen:  . Abdomen is soft, non-tender, non-distended. . No hernias, masses, or organomegaly. . Normoactive bowel sounds.  Musculoskeletal:  . No cyanosis, clubbing or edema Skin:  . No rashes, lesions, ulcers . palpation of skin: no induration or nodules Neurologic:  Patient is moving all extremities. CN II - XIi grossly intact. [name] Kitchen Psychiatric: Mood and affect are congruent.  I have personally reviewed the following:   Today's Data  . CMP, CBC, Vitals  Micro Data  . Urine culture + for E. Coli sensitive to Rocephin. . Blood Cultures x 2 + for E. Coli sensitive to Rocephin  Scheduled Meds: . atorvastatin  80 mg Oral QHS  . clopidogrel  75 mg Oral Daily  . enoxaparin (LOVENOX) injection  40 mg Subcutaneous Q24H  . insulin aspart  0-5 Units Subcutaneous QHS  . insulin aspart  0-9 Units Subcutaneous TID WC  .  metoprolol tartrate  25 mg Oral BID   Continuous Infusions: . 0.9 % NaCl with KCl 40 mEq / L 75 mL/hr (03/26/19 0330)  . cefTRIAXone (ROCEPHIN)  IV 2 g (03/26/19 1657)    Active Problems:   Dyslipidemia, goal LDL below 70   Sinus bradycardia   Sepsis secondary to UTI ([name])   Hypokalemia   Prediabetes   LOS: 3 days    A & P   Sepsis: Upon admission based upon fever, leukocytosis, tachypnea, lactic acidosis, and  Hypotension. Resolved now after IV fluid resuscitation and IV antibiotics as per Sepsis protocol.  UTI: E. Coli has grown out of urine cultures. Susceptible to Rocephin.   Bacteremia: Blood cultures x 2 have grown out E. Coli. Susceptible to Rocephin. Surveillance cultureshave been drawn and are being  monitored. Will convert to Keflex if no growth in 2-3 days.  Elevated D dimer: Due to sepsis. Low suspicion for DVT confirmed by CTA chest.  Hypokalemia: Resolved. Monitor intermittently.  Hypertension: Continue metoprolol as at home.  Hyperlipidemia: Home dose of statin is continued.  CAD: S/P stents. Continue metoprolol, statin, and plavix. No asa at home.  DM II: HbA1c is 7.5. Will start low dose of lantus and correction insulin. Monitor. Glucoses for the last 24 hours have been 139 - 171.  I have seen and examined this patient myself. I have spent 30 minutes in his evaluation and care.  DVT prophylaxis: Lovenox Code Status: Full Code Family Communication: None available Disposition Plan: tbd   Wess Baney, DO [facility] Direct contact: see [email or web address]  7PM-7AM contact night coverage as above 03/26/2019, 5:25 PM  LOS: 1 day

## 2019-03-26 NOTE — Evaluation (Signed)
Physical Therapy Evaluation Patient Details Name: Tanner [name] MRN: [id number] DOB: 07/08/1924 Today's Date: 03/26/2019   History of Present Illness  85 year old man admitted with sepsis secondary to UTI.  Presented with fever, generalized weakness. ER doc was told he tested + for COVID 2-3 weeks prior to admission.  Tested negative here.  PMH:  CAD with angioplasty, CABG, TIA and neuropathy  Clinical Impression  Pt admitted with above diagnosis. Pt currently with functional limitations due to the deficits listed below (see PT Problem List).  Pt assisted with ambulating in hallway and tolerated good distance however unsteady.  Recommended pt use RW upon d/c for safety as he typically does not use assistive device. Pt will benefit from skilled PT to increase their independence and safety with mobility to allow discharge to the venue listed below.       Follow Up Recommendations Home health PT;Supervision/Assistance - 24 hour    Equipment Recommendations  Rolling walker with 5" wheels    Recommendations for Other Services       Precautions / Restrictions Precautions Precautions: Fall Restrictions Weight Bearing Restrictions: No      Mobility  Bed Mobility Overal bed mobility: Needs Assistance Bed Mobility: Supine to Sit     Supine to sit: Min guard     General bed mobility comments: provided a hand for pt to self assist trunk upright  Transfers Overall transfer level: Needs assistance Equipment used: 1 person hand held assist Transfers: Sit to/from Stand Sit to Stand: Min assist         General transfer comment: assist to steady with rise, declined using assistive device today (wanted to see how he would do without one since he doesn't typically use)  Ambulation/Gait Ambulation/Gait assistance: Min guard;Min assist Gait Distance (Feet): 200 Feet Assistive device: IV Pole;1 person hand held assist Gait Pattern/deviations: Step-through pattern;Decreased stride  length     General Gait Details: verbal cues for UE support, pt agreeable to use RW upon d/c due to unsteadiness, increased work of breathing with ambulating an use of restroom however SpO2 99% room air  Stairs            Wheelchair Mobility    Modified Rankin (Stroke Patients Only)       Balance Overall balance assessment: Needs assistance         Standing balance support: Single extremity supported Standing balance-[name] Scale: Poor Standing balance comment: requires UE support                             Pertinent Vitals/Pain Pain Assessment: No/denies pain    Home Living Family/patient expects to be discharged to:: Private residence(friends home Independent Living) Living Arrangements: Spouse/significant other   Type of Home: Independent living facility         Home Equipment: Gilford Rile - 2 wheels;Kasandra Knudsen - single point Additional Comments: lives with wife, bars in shower and by toilet.     Prior Function Level of Independence: Independent         Comments: pt does not use AD; wife uses a walker.  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        Extremity/Trunk Assessment   Upper Extremity Assessment Upper Extremity Assessment: Overall WFL for tasks assessed    Lower Extremity Assessment Lower Extremity Assessment: Generalized weakness    Cervical / Trunk Assessment Cervical / Trunk Assessment: Kyphotic  Communication   Communication: No difficulties  Cognition Arousal/Alertness: Awake/alert Behavior During Therapy: WFL for tasks assessed/performed Overall Cognitive Status: Within Functional Limits for tasks assessed                                        General Comments      Exercises     Assessment/Plan    PT Assessment Patient needs continued PT services  PT Problem List Decreased strength;Decreased mobility;Decreased activity tolerance;Decreased balance;Decreased knowledge of  use of DME       PT Treatment Interventions DME instruction;Therapeutic exercise;Gait training;Balance training;Functional mobility training;Therapeutic activities;Patient/family education    PT Goals (Current goals can be found in the Care Plan section)  Acute Rehab PT Goals Patient Stated Goal: home PT Goal Formulation: With patient Time For Goal Achievement: 04/02/19 Potential to Achieve Goals: Good    Frequency Min 3X/week   Barriers to discharge        Co-evaluation               AM-PAC PT "6 Clicks" Mobility  Outcome Measure Help needed turning from your back to your side while in a flat bed without using bedrails?: A Little Help needed moving from lying on your back to sitting on the side of a flat bed without using bedrails?: A Little Help needed moving to and from a bed to a chair (including a wheelchair)?: A Little Help needed standing up from a chair using your arms (e.g., wheelchair or bedside chair)?: A Little Help needed to walk in hospital room?: A Little Help needed climbing 3-5 steps with a railing? : A Little 6 Click Score: 18    End of Session Equipment Utilized During Treatment: Gait belt Activity Tolerance: Patient tolerated treatment well Patient left: in chair;with chair alarm set(with OT)   PT Visit Diagnosis: Other abnormalities of gait and mobility (R26.89)    Time: 5830-9407 PT Time Calculation (min) (ACUTE ONLY): 17 min   Charges:   PT Evaluation $PT Eval Low Complexity: Jerome, PT, DPT Acute Rehabilitation Services Office: [phone] Pager: [phone]  Trena Platt 03/26/2019, 1:54 PM

## 2019-03-27 LAB — GLUCOSE, CAPILLARY
Glucose-Capillary: 216 mg/dL — ABNORMAL HIGH (ref 70–99)
Glucose-Capillary: 209 mg/dL — ABNORMAL HIGH (ref 70–99)
Glucose-Capillary: 141 mg/dL — ABNORMAL HIGH (ref 70–99)
Glucose-Capillary: 152 mg/dL — ABNORMAL HIGH (ref 70–99)

## 2019-03-27 MED ORDER — CEPHALEXIN 500 MG PO CAPS
500.0000 mg | ORAL_CAPSULE | Freq: Three times a day (TID) | ORAL | Status: DC
  Administered 2019-03-27 – 2019-03-28 (×4): 500 mg via ORAL
  Filled 2019-03-27 (×4): qty 1

## 2019-03-27 NOTE — Plan of Care (Signed)

## 2019-03-27 NOTE — Progress Notes (Signed)
PROGRESS NOTE  Tanner [name] ZOX:[id number] DOB: 14-Jun-1924 DOA: 03/23/2019 PCP: Deland Pretty, MD  Brief History   The patient is a 85 yr old man who presented to the [facility] from an assisted living facility with complaints of fever and generalized weakness. In the ED he was found to have a UTI and sepsis. Blood cultures x 2 were drawn and have grown out E.colil as has his urine cultures. The patient is receiving IV Rocephin. Susceptibilities are pending. Surveillance cultures have been drawn on 03/25/2019. No growth as of yet.  Consultants  . None  Procedures  . None  Antibiotics   Anti-infectives (From admission, onward)   Start     Dose/Rate Route Frequency Ordered Stop   03/27/19 0900  cephALEXin (KEFLEX) capsule 500 mg     500 mg Oral Every 8 hours 03/27/19 0859 04/06/19 0559   03/24/19 1800  cefTRIAXone (ROCEPHIN) 2 g in sodium chloride 0.9 % 100 mL IVPB  Status:  Discontinued     2 g 200 mL/hr over 30 Minutes Intravenous Every 24 hours 03/23/19 2007 03/27/19 0900   03/23/19 1800  cefTRIAXone (ROCEPHIN) 1 g in sodium chloride 0.9 % 100 mL IVPB  Status:  Discontinued     1 g 200 mL/hr over 30 Minutes Intravenous Every 24 hours 03/23/19 1705 03/23/19 2007   03/23/19 0500  vancomycin (VANCOCIN) 1,500 mg in sodium chloride 0.9 % 500 mL IVPB     1,500 mg 250 mL/hr over 120 Minutes Intravenous  Once 03/23/19 0442 03/23/19 0848   03/23/19 0430  ceFEPIme (MAXIPIME) 2 g in sodium chloride 0.9 % 100 mL IVPB     2 g 200 mL/hr over 30 Minutes Intravenous  Once 03/23/19 0421 03/23/19 0544   03/23/19 0430  metroNIDAZOLE (FLAGYL) IVPB 500 mg     500 mg 100 mL/hr over 60 Minutes Intravenous  Once 03/23/19 0421 03/23/19 0544   03/23/19 0430  vancomycin (VANCOCIN) IVPB 1000 mg/200 mL premix  Status:  Discontinued     1,000 mg 200 mL/hr over 60 Minutes Intravenous  Once 03/23/19 0421 03/23/19 0442      Subjective  The patient is resting quietly. He is resting comfortably. No new  complaints.  Objective   Vitals:  Vitals:   03/26/19 2153 03/27/19 0538  BP: (!) 152/88 (!) 153/97  Pulse: (!) 104 84  Resp: 19 20  Temp: 98.4 F (36.9 C) 98.7 F (37.1 C)  SpO2: 93% 94%    Exam:  Constitutional:  . The patient is resting comfortably. No acute distress. Respiratory:  . No increased work of breathing. . No wheezes, rales, or rhonchi. . No tactile fremitus. Cardiovascular:  . Irregular rate and rhythm. . No murmurs, ectopy, or gallups. . No lateral PMI. No thrills. Abdomen:  . Abdomen is soft, non-tender, non-distended. . No hernias, masses, or organomegaly. . Normoactive bowel sounds.  Musculoskeletal:  . No cyanosis, clubbing or edema Skin:  . No rashes, lesions, ulcers . palpation of skin: no induration or nodules Neurologic:  Patient is moving all extremities. CN II - XIi grossly intact. [name] Kitchen Psychiatric: Mood and affect are congruent.  I have personally reviewed the following:   Today's Data  . CMP, CBC, Vitals  Micro Data  . Urine culture + for E. Coli sensitive to Rocephin. . Blood Cultures x 2 + for E. Coli sensitive to Rocephin  Scheduled Meds: . atorvastatin  80 mg Oral QHS  . cephALEXin  500 mg Oral Q8H  . clopidogrel  75 mg Oral Daily  . enoxaparin (LOVENOX) injection  40 mg Subcutaneous Q24H  . insulin aspart  0-5 Units Subcutaneous QHS  . insulin aspart  0-9 Units Subcutaneous TID WC  . metoprolol tartrate  25 mg Oral BID   Continuous Infusions: . 0.9 % NaCl with KCl 40 mEq / L 75 mL/hr (03/27/19 0541)    Active Problems:   Dyslipidemia, goal LDL below 70   Sinus bradycardia   Sepsis secondary to UTI ([place])   Hypokalemia   Prediabetes   LOS: 4 days    A & P   Sepsis: Upon admission based upon fever, leukocytosis, tachypnea, lactic acidosis, and  Hypotension. Resolved now after IV fluid resuscitation and IV antibiotics as per Sepsis protocol.  UTI: E. Coli has grown out of urine cultures. Susceptible to Rocephin.    Bacteremia: Blood cultures x 2 have grown out E. Coli. Susceptible to Rocephin. Surveillance cultureshave been drawn and are being monitored. Blood cultures have had no growth x 48 hours. Will convert the patient to Keflex and monitor for the next 24 hours.  Debility: The patient has been evaluated the patient. Their recommendation is for [facility] PT/OT and rolling walker at home. I have discussed this with the patient's POA who states that he thinks that this is a workable option.  Elevated D dimer: Due to sepsis. Low suspicion for DVT confirmed by CTA chest.  Hypokalemia: Resolved. Monitor intermittently.  Hypertension: Continue metoprolol as at home.  Hyperlipidemia: Home dose of statin is continued.  CAD: S/P stents. Continue metoprolol, statin, and plavix. No asa at home.  DM II: HbA1c is 7.5. Will start low dose of lantus and correction insulin. Monitor. Glucoses for the last 24 hours have been 139 - 171.  I have seen and examined this patient myself. I have spent 30 minutes in his evaluation and care.  DVT prophylaxis: Lovenox Code Status: Full Code Family Communication: I have discussed the patient with his nephew who is POA today in detail. All questions answered to the best of my ability. Disposition Plan: To home tomorrow.  Janelle Spellman, DO [facility] Direct contact: see [email or web address]  7PM-7AM contact night coverage as above 03/27/2019, 3:59 PM  LOS: 1 day

## 2019-03-28 LAB — BASIC METABOLIC PANEL
Anion gap: 11 (ref 5–15)
BUN: 13 mg/dL (ref 8–23)
CO2: 20 mmol/L — ABNORMAL LOW (ref 22–32)
Calcium: 8.3 mg/dL — ABNORMAL LOW (ref 8.9–10.3)
Chloride: 107 mmol/L (ref 98–111)
Creatinine, Ser: 0.66 mg/dL (ref 0.61–1.24)
GFR calc Af Amer: >60 mL/min (ref >60)
GFR calc non Af Amer: >60 mL/min (ref >60)
Glucose, Bld: 146 mg/dL — ABNORMAL HIGH (ref 70–99)
Potassium: 4 mmol/L (ref 3.5–5.1)
Sodium: 138 mmol/L (ref 135–145)

## 2019-03-28 LAB — CBC WITH DIFFERENTIAL/PLATELET
Abs Immature Granulocytes: 0.05 10*3/uL (ref 0.00–0.07)
Basophils Absolute: 0 10*3/uL (ref 0.0–0.1)
Basophils Relative: 0 %
Eosinophils Absolute: 0.2 10*3/uL (ref 0.0–0.5)
Eosinophils Relative: 2 %
HCT: 35.8 % — ABNORMAL LOW (ref 39.0–52.0)
Hemoglobin: 11.6 g/dL — ABNORMAL LOW (ref 13.0–17.0)
Immature Granulocytes: 1 %
Lymphocytes Relative: 12 %
Lymphs Abs: 1 10*3/uL (ref 0.7–4.0)
MCH: 32 pg (ref 26.0–34.0)
MCHC: 32.4 g/dL (ref 30.0–36.0)
MCV: 98.6 fL (ref 80.0–100.0)
Monocytes Absolute: 0.6 10*3/uL (ref 0.1–1.0)
Monocytes Relative: 7 %
Neutro Abs: 6.4 10*3/uL (ref 1.7–7.7)
Neutrophils Relative %: 78 %
Platelets: 172 10*3/uL (ref 150–400)
RBC: 3.63 MIL/uL — ABNORMAL LOW (ref 4.22–5.81)
RDW: 12.6 % (ref 11.5–15.5)
WBC: 8.2 10*3/uL (ref 4.0–10.5)
nRBC: 0 % (ref 0.0–0.2)

## 2019-03-28 LAB — GLUCOSE, CAPILLARY
Glucose-Capillary: 150 mg/dL — ABNORMAL HIGH (ref 70–99)
Glucose-Capillary: 214 mg/dL — ABNORMAL HIGH (ref 70–99)

## 2019-03-28 MED ORDER — CEPHALEXIN 500 MG PO CAPS: 500.0000 mg | ORAL_CAPSULE | Freq: Three times a day (TID) | ORAL | 0 refills | Status: AC

## 2019-03-28 MED ORDER — CEPHALEXIN 500 MG PO CAPS: 500.0000 mg | ORAL_CAPSULE | Freq: Three times a day (TID) | ORAL | 0 refills | Status: DC

## 2019-03-28 MED ORDER — METOPROLOL TARTRATE 25 MG PO TABS: 25.0000 mg | ORAL_TABLET | Freq: Two times a day (BID) | ORAL | 0 refills | Status: DC

## 2019-03-28 NOTE — Discharge Summary (Signed)
Physician Discharge Summary  Tanner [name] RKY:[id number] DOB: April 13, 1924 DOA: 03/23/2019  PCP: Deland Pretty, MD  Admit date: 03/23/2019 Discharge date: 03/28/2019  Recommendations for Outpatient Follow-up:  1. Patient will have home health PT/OT 2. Patient is to follow up with his PCP in 7-10 days. 3. He will have a rolling walker at discharge. He is to use wit for ambulation.  Discharge Diagnoses: Principal diagnosis is #1 1. E. Coli Bacteremia 2. E. Coli UTI 3. Sepsis 4. Aspiration pneumonia 5. Hypokalemia 6. Hypertension  Discharge Condition: Good Disposition: Home with Home Health  Diet recommendation: Carbohydrate controlled.  Filed Weights   03/25/19 0444 03/26/19 0500 03/28/19 0545  Weight: 73.1 kg 71.3 kg 70.5 kg    History of present illness:  Tanner [name] is a 85 y.o. male with medical history significant of benign essential tremors, CAD status post percutaneous coronary angioplasty on Plavix, hyperlipidemia and couple other comorbidities was sent to the emergency department from assisted living facility due to having fever of 104 and generalized weakness.  According to patient, he started having fever and generalized weakness just yesterday.  Temperature was 104 at assisted living facility.  He denies having had any other complaint such as dizziness, headache, chest pain, cough, shortness of breath, chills, sweating, any problem with urination or with bowel movement.  No recent sick contact or any recent travel.  According to ED physician, they were reported that patient was tested positive for COVID-19 2 or 3 weeks ago however he has been walking around, shopping around and meeting other people with no quarantine at all.  ED Course: Upon arrival to the emergency department, his initial blood pressure was within normal limits which soon fell to 99/57.  His temperature was 103.5 and he was tachypneic at 28.  Oxygen saturation was 93% on room air.CBC showed chronic  pancytopenia.  CMP showed hyperglycemia with sugar of 201 and hypokalemia with 3.4.  Lactic acid 3.4, repeat 4.6 without any IV fluids.  UA showed positive nitrites and leukoesterase and cloudy appearance.  According to the report, due to the history that was given to the ED physician about him being positive for COVID-19, initial suspicion was that his symptoms are likely secondary to that so ED work-up was directed towards COVID-19 infection so he ended up having all the inflammatory markers and had slightly elevated CRP and d-dimer however LDH, ferritin as well as prolactin were within normal range.  He was tested negative for COVID-19.  Blood culture and urine culture were drawn.  He was given some IV fluids.  Chest x-ray was unremarkable as well.  He was diagnosed with sepsis secondary to UTI and hospital service was requested to admit the patient.  Hospital Course:  The patient is a 85 yr old man who presented to the [facility] from an assisted living facility with complaints of fever and generalized weakness. In the ED he was found to have a UTI and sepsis. Blood cultures x 2 were drawn and have grown out E.colil as has his urine cultures. The patient is receiving IV Rocephin. Susceptibilities are pending. Surveillance cultures have been drawn on 03/25/2019. No growth as of yet. The patient was transitioned to oral antibiotics yesterday. She is doing well.  Today's assessment: S: The patient is resting comfortably. No new complaints. O: Vitals:  Vitals:   03/28/19 0017 03/28/19 0540  BP: (!) 160/68 (!) 160/80  Pulse: 62 77  Resp:  19  Temp:  98.5 F (36.9 C)  SpO2:  93%   Constitutional:   The patient is resting comfortably. No acute distress. Respiratory:   No increased work of breathing.  No wheezes, rales, or rhonchi.  No tactile fremitus. Cardiovascular:   Irregular rate and rhythm.  No murmurs, ectopy, or gallups.  No lateral PMI. No thrills. Abdomen:   Abdomen is soft,  non-tender, non-distended.  No hernias, masses, or organomegaly.  Normoactive bowel sounds.  Musculoskeletal:   No cyanosis, clubbing or edema Skin:   No rashes, lesions, ulcers  palpation of skin: no induration or nodules Neurologic:  Patient is moving all extremities. CN II - XIi grossly intact. [name] Kitchen Psychiatric: Mood and affect are congruent.  Discharge Instructions  Discharge Instructions    Activity as tolerated - No restrictions   Complete by: As directed    Call MD for:  persistant nausea and vomiting   Complete by: As directed    Call MD for:  severe uncontrolled pain   Complete by: As directed    Diet - low sodium heart healthy   Complete by: As directed    Discharge instructions   Complete by: As directed    The patient is to follow up with his PCP in 7-10 days. He will have Home Health PT/OT to assist with debility and gait instability.   Increase activity slowly   Complete by: As directed      Allergies as of 03/28/2019   No Known Allergies     Medication List    TAKE these medications   atorvastatin 80 MG tablet Commonly known as: LIPITOR Take 80 mg by mouth at bedtime.   cephALEXin 500 MG capsule Commonly known as: KEFLEX Take 1 capsule (500 mg total) by mouth every 8 (eight) hours for 4 days.   clopidogrel 75 MG tablet Commonly known as: PLAVIX Take 1 tablet (75 mg total) by mouth daily. What changed: Another medication with the same name was removed. Continue taking this medication, and follow the directions you see here.   fish oil-omega-3 fatty acids 1000 MG capsule Take 1 g by mouth daily.   metoprolol tartrate 25 MG tablet Commonly known as: LOPRESSOR Take 1 tablet (25 mg total) by mouth 2 (two) times daily. What changed: how much to take   multivitamin capsule Take 1 capsule by mouth at bedtime.            Durable Medical Equipment  (From admission, onward)         Start     Ordered   03/28/19 0852  DME 3-in-1  Once      03/28/19 5625   03/28/19 0849  For home use only DME 4 wheeled rolling walker with seat  (Walkers)  Once    Question:  Patient needs a walker to treat with the following condition  Answer:  Debility   03/28/19 0852         No Known Allergies  The results of significant diagnostics from this hospitalization (including imaging, microbiology, ancillary and laboratory) are listed below for reference.    Significant Diagnostic Studies: Ct Angio Chest Pe W Or Wo Contrast  Result Date: 03/23/2019 CLINICAL DATA:  PE suspected, fever EXAM: CT ANGIOGRAPHY CHEST WITH CONTRAST TECHNIQUE: Multidetector CT imaging of the chest was performed using the standard protocol during bolus administration of intravenous contrast. Multiplanar CT image reconstructions and MIPs were obtained to evaluate the vascular anatomy. CONTRAST:  123mL OMNIPAQUE IOHEXOL 350 MG/ML SOLN COMPARISON:  12/11/2013 FINDINGS: Cardiovascular: Satisfactory opacification of the pulmonary arteries to  the segmental level. No evidence of pulmonary embolism. Cardiomegaly. Three-vessel coronary artery calcifications and/or stents status post median sternotomy and CABG. Aortic atherosclerosis. No pericardial effusion. Mediastinum/Nodes: Calcified AP window lymph nodes, consistent with prior granulomatous infection. Small hiatal hernia. Thyroid gland, trachea, and esophagus demonstrate no significant findings. Lungs/Pleura: Trace bilateral pleural effusions with associated bibasilar heterogeneous opacity and consolidation and/or atelectasis. There is diffuse bilateral bronchial wall thickening, worst in the dependent lower lobes. Upper Abdomen: No acute abnormality. Musculoskeletal: No chest wall abnormality. No acute or significant osseous findings. Nonacute wedge deformity of the T7 vertebral body. Review of the MIP images confirms the above findings. IMPRESSION: 1.  Negative examination for pulmonary embolism. 2. Trace bilateral pleural effusions and  associated bibasilar heterogeneous opacity and consolidation and/or atelectasis. There is diffuse bilateral bronchial wall thickening, worst in the dependent lower lobes. Findings are suggestive of aspiration, which may be chronic or ongoing. 3. Cardiomegaly, coronary artery disease, and aortic atherosclerosis. Electronically Signed   By: Eddie Candle M.D.   On: 03/23/2019 17:00   Dg Chest Port 1 View  Result Date: 03/23/2019 CLINICAL DATA:  Code sepsis EXAM: PORTABLE CHEST 1 VIEW COMPARISON:  11/22/2015 FINDINGS: Normal heart size. Stable aortic tortuosity when accounting for rotation. CABG. There is no edema, consolidation, effusion, or pneumothorax. Remote left rib fractures. Advanced glenohumeral osteoarthritis on the right IMPRESSION: Negative for pneumonia. Electronically Signed   By: Monte Fantasia M.D.   On: 03/23/2019 04:58    Microbiology: Recent Results (from the past 240 hour(s))  Blood Culture (routine x 2)     Status: Abnormal   Collection Time: 03/23/19  4:21 AM   Specimen: BLOOD  Result Value Ref Range Status   Specimen Description   Final    BLOOD LEFT ARM Performed at [facility] [address], [place], [place] [address]    Special Requests   Final    BOTTLES DRAWN AEROBIC AND ANAEROBIC Blood Culture results may not be optimal due to an inadequate volume of blood received in culture bottles Performed at [facility] [address], [place], [place] [address]    Culture  Setup Time   Final    GRAM NEGATIVE RODS IN BOTH AEROBIC AND ANAEROBIC BOTTLES CRITICAL RESULT CALLED TO, READ BACK BY AND VERIFIED WITH: J LEGGE PHARMD 2004 03/23/19 A BROWNING    Culture (A)  Final    ESCHERICHIA COLI SUSCEPTIBILITIES PERFORMED ON PREVIOUS CULTURE WITHIN THE LAST 5 DAYS. Performed at [facility], [place] [address] [place], [place] [address]    Report Status 03/25/2019 FINAL  Final  Urine culture     Status: Abnormal   Collection Time:  03/23/19  4:21 AM   Specimen: In/Out Cath Urine  Result Value Ref Range Status   Specimen Description   Final    IN/OUT CATH URINE Performed at [facility] [address], [place], [place] [address]    Special Requests   Final    NONE Performed at [facility], [place] [address], [place], [place] [address]    Culture 2,000 COLONIES/mL ESCHERICHIA COLI (A)  Final   Report Status 03/25/2019 FINAL  Final   Organism ID, Bacteria ESCHERICHIA COLI (A)  Final      Susceptibility   Escherichia coli - MIC*    AMPICILLIN <=2 SENSITIVE Sensitive     CEFAZOLIN <=4 SENSITIVE Sensitive     CEFTRIAXONE <=1 SENSITIVE Sensitive     CIPROFLOXACIN <=0.25 SENSITIVE Sensitive  GENTAMICIN <=1 SENSITIVE Sensitive     IMIPENEM <=0.25 SENSITIVE Sensitive     NITROFURANTOIN <=16 SENSITIVE Sensitive     TRIMETH/SULFA <=20 SENSITIVE Sensitive     AMPICILLIN/SULBACTAM <=2 SENSITIVE Sensitive     PIP/TAZO <=4 SENSITIVE Sensitive     Extended ESBL NEGATIVE Sensitive     * 2,000 COLONIES/mL ESCHERICHIA COLI  Blood Culture (routine x 2)     Status: Abnormal   Collection Time: 03/23/19  4:26 AM   Specimen: BLOOD  Result Value Ref Range Status   Specimen Description   Final    BLOOD RIGHT ANTECUBITAL Performed at [facility] [address], [place], [place] [address]    Special Requests   Final    BOTTLES DRAWN AEROBIC AND ANAEROBIC Blood Culture adequate volume Performed at [facility] [address] [place] [address], [place], [place] [address]    Culture  Setup Time   Final    GRAM NEGATIVE RODS IN BOTH AEROBIC AND ANAEROBIC BOTTLES Organism ID to follow CRITICAL RESULT CALLED TO, READ BACK BY AND VERIFIED WITH: Christean Grief [facility] 2004 03/23/19 A BROWNING Performed at [facility], [place] [address] [place], [place] [address]    Culture ESCHERICHIA COLI (A)  Final   Report Status 03/25/2019 FINAL  Final   Organism ID, Bacteria  ESCHERICHIA COLI  Final      Susceptibility   Escherichia coli - MIC*    AMPICILLIN <=2 SENSITIVE Sensitive     CEFAZOLIN <=4 SENSITIVE Sensitive     CEFEPIME <=1 SENSITIVE Sensitive     CEFTAZIDIME <=1 SENSITIVE Sensitive     CEFTRIAXONE <=1 SENSITIVE Sensitive     CIPROFLOXACIN <=0.25 SENSITIVE Sensitive     GENTAMICIN <=1 SENSITIVE Sensitive     IMIPENEM <=0.25 SENSITIVE Sensitive     TRIMETH/SULFA <=20 SENSITIVE Sensitive     AMPICILLIN/SULBACTAM <=2 SENSITIVE Sensitive     PIP/TAZO <=4 SENSITIVE Sensitive     Extended ESBL NEGATIVE Sensitive     * ESCHERICHIA COLI  Blood Culture ID Panel (Reflexed)     Status: Abnormal   Collection Time: 03/23/19  4:26 AM  Result Value Ref Range Status   Enterococcus species NOT DETECTED NOT DETECTED Final   Listeria monocytogenes NOT DETECTED NOT DETECTED Final   Staphylococcus species NOT DETECTED NOT DETECTED Final   Staphylococcus aureus (BCID) NOT DETECTED NOT DETECTED Final   Streptococcus species NOT DETECTED NOT DETECTED Final   Streptococcus agalactiae NOT DETECTED NOT DETECTED Final   Streptococcus pneumoniae NOT DETECTED NOT DETECTED Final   Streptococcus pyogenes NOT DETECTED NOT DETECTED Final   Acinetobacter baumannii NOT DETECTED NOT DETECTED Final   Enterobacteriaceae species DETECTED (A) NOT DETECTED Final    Comment: Enterobacteriaceae represent a large family of gram-negative bacteria, not a single organism. CRITICAL RESULT CALLED TO, READ BACK BY AND VERIFIED WITH: J LEGGE PHARMD 2004 03/23/19 A BROWNING    Enterobacter cloacae complex NOT DETECTED NOT DETECTED Final   Escherichia coli DETECTED (A) NOT DETECTED Final    Comment: CRITICAL RESULT CALLED TO, READ BACK BY AND VERIFIED WITH: J LEGGE PHARMD 2004 03/23/19 A BROWNING    Klebsiella oxytoca NOT DETECTED NOT DETECTED Final   Klebsiella pneumoniae NOT DETECTED NOT DETECTED Final   Proteus species NOT DETECTED NOT DETECTED Final   Serratia marcescens NOT DETECTED  NOT DETECTED Final   Carbapenem resistance NOT DETECTED NOT DETECTED Final   Haemophilus influenzae NOT DETECTED NOT DETECTED Final   Neisseria meningitidis NOT DETECTED  NOT DETECTED Final   Pseudomonas aeruginosa NOT DETECTED NOT DETECTED Final   Candida albicans NOT DETECTED NOT DETECTED Final   Candida glabrata NOT DETECTED NOT DETECTED Final   Candida krusei NOT DETECTED NOT DETECTED Final   Candida parapsilosis NOT DETECTED NOT DETECTED Final   Candida tropicalis NOT DETECTED NOT DETECTED Final    Comment: Performed at [facility], [place] [address] [place], [place] [address]  SARS Coronavirus 2 (CEPHEID- Performed in [facility] lab), Hosp Order     Status: None   Collection Time: 03/23/19  4:55 AM   Specimen: Nasopharyngeal Swab  Result Value Ref Range Status   SARS Coronavirus 2 NEGATIVE NEGATIVE Final    Comment: (NOTE) If result is NEGATIVE SARS-CoV-2 target nucleic acids are NOT DETECTED. The SARS-CoV-2 RNA is generally detectable in upper and lower  respiratory specimens during the acute phase of infection. The lowest  concentration of SARS-CoV-2 viral copies this assay can detect is 250  copies / mL. A negative result does not preclude SARS-CoV-2 infection  and should not be used as the sole basis for treatment or other  patient management decisions.  A negative result may occur with  improper specimen collection / handling, submission of specimen other  than nasopharyngeal swab, presence of viral mutation(s) within the  areas targeted by this assay, and inadequate number of viral copies  (<250 copies / mL). A negative result must be combined with clinical  observations, patient history, and epidemiological information. If result is POSITIVE SARS-CoV-2 target nucleic acids are DETECTED. The SARS-CoV-2 RNA is generally detectable in upper and lower  respiratory specimens dur ing the acute phase of infection.  Positive  results are indicative of active  infection with SARS-CoV-2.  Clinical  correlation with patient history and other diagnostic information is  necessary to determine patient infection status.  Positive results do  not rule out bacterial infection or co-infection with other viruses. If result is PRESUMPTIVE POSTIVE SARS-CoV-2 nucleic acids MAY BE PRESENT.   A presumptive positive result was obtained on the submitted specimen  and confirmed on repeat testing.  While 2019 novel coronavirus  (SARS-CoV-2) nucleic acids may be present in the submitted sample  additional confirmatory testing may be necessary for epidemiological  and / or clinical management purposes  to differentiate between  SARS-CoV-2 and other Sarbecovirus currently known to infect humans.  If clinically indicated additional testing with an alternate test  methodology [phone]) is advised. The SARS-CoV-2 RNA is generally  detectable in upper and lower respiratory sp ecimens during the acute  phase of infection. The expected result is Negative. Fact Sheet for Patients:  StrictlyIdeas.no Fact Sheet for Healthcare Providers: BankingDealers.co.za This test is not yet approved or cleared by the [place] FDA and has been authorized for detection and/or diagnosis of SARS-CoV-2 by FDA under an Emergency Use Authorization (EUA).  This EUA will remain in effect (meaning this test can be used) for the duration of the COVID-19 declaration under Section 564(b)(1) of the Act, 21 U.S.C. section 360bbb-3(b)(1), unless the authorization is terminated or revoked sooner. Performed at [facility], [place] [address], [place], [place] [address]   Culture, blood (routine x 2)     Status: None (Preliminary result)   Collection Time: 03/25/19 11:19 AM   Specimen: BLOOD  Result Value Ref Range Status   Specimen Description   Final    BLOOD RIGHT ANTECUBITAL Performed at [facility]  [address]  [place], [place] [address]    Special Requests   Final    BOTTLES DRAWN AEROBIC AND ANAEROBIC Blood Culture adequate volume Performed at [facility] [address], [place], [place] [address]    Culture   Final    NO GROWTH 3 DAYS Performed at [facility], [place] [address], [place], [place] [address]    Report Status PENDING  Incomplete  Culture, blood (routine x 2)     Status: None (Preliminary result)   Collection Time: 03/25/19 11:24 AM   Specimen: BLOOD LEFT HAND  Result Value Ref Range Status   Specimen Description   Final    BLOOD LEFT HAND Performed at [facility] [address] [place], [place] [address]    Special Requests   Final    BOTTLES DRAWN AEROBIC AND ANAEROBIC Blood Culture adequate volume Performed at [facility] [address] [place], [place] [address]    Culture   Final    NO GROWTH 3 DAYS Performed at [facility], [place] [address], [place], [place] [address]    Report Status PENDING  Incomplete     Labs: Basic Metabolic Panel: Recent Labs  Lab 03/23/19 0421 03/23/19 1717 03/24/19 0439 03/26/19 0419 03/28/19 0459  NA 138  --  138 135 138  K 3.4*  --  3.8 4.1 4.0  CL 103  --  108 105 107  CO2 23  --  22 20* 20*  GLUCOSE 201*  --  126* 168* 146*  BUN 22  --  20 16 13   CREATININE 0.89 0.97 0.78 0.70 0.66  CALCIUM 8.9  --  8.3* 8.2* 8.3*  MG  --  1.9  --   --   --    Liver Function Tests: Recent Labs  Lab 03/23/19 0421 03/24/19 0439  AST 39 44*  ALT 30 32  ALKPHOS 70 59  BILITOT 1.6* 1.5*  PROT 7.0 6.1*  ALBUMIN 3.2* 2.8*   No results for input(s): LIPASE, AMYLASE in the last 168 hours. No results for input(s): AMMONIA in the last 168 hours. CBC: Recent Labs  Lab 03/23/19 0421 03/23/19 1717 03/24/19 0439 03/26/19 0419 03/28/19 0459  WBC 2.9* 12.7* 7.9 8.2 8.2  NEUTROABS 2.6  --   --  6.6 6.4  HGB 11.6* 10.9* 10.1* 11.0* 11.6*  HCT 35.7* 33.7*  31.7* 33.4* 35.8*  MCV 98.9 100.6* 99.1 97.7 98.6  PLT 116* 106* 102* 130* 172   Cardiac Enzymes: No results for input(s): CKTOTAL, CKMB, CKMBINDEX, TROPONINI in the last 168 hours. BNP: BNP (last 3 results) No results for input(s): BNP in the last 8760 hours.  ProBNP (last 3 results) No results for input(s): PROBNP in the last 8760 hours.  CBG: Recent Labs  Lab 03/27/19 1128 03/27/19 1637 03/27/19 2100 03/28/19 0714 03/28/19 1113  GLUCAP 209* 141* 152* 150* 214*    Active Problems:   Dyslipidemia, goal LDL below 70   Sinus bradycardia   Sepsis secondary to UTI ([place])   Hypokalemia   Prediabetes   Time coordinating discharge: 38 minutes  Signed:        Yenni Carra, DO [facility]  03/28/2019, 12:43 PM

## 2019-03-28 NOTE — TOC Progression Note (Signed)
Transition of Care [facility]) - Progression Note    Patient Details  Name: Tanner [name] MRN: [id number] Date of Birth: 12/28/1923  [facility] CM/SW Contact  Joaquin Courts, RN Phone Number: 03/28/2019, 1:32 PM  Clinical Narrative:   CM spoke with patient at bedside who indicates that he does not want a 3-in-1 and that he has walkers available to him if he neneds one and does not want CM to order one for him. Patient states he will be transported by his nephew back to [facility] at d/c.  CM spoke with Rose at [facility] and notified of discharge and the need for patient to have HHPT/OT. [facility] uses [employer] therapy services and will arrange for this for patient. Copies of d/c summary, therapy notes, and [place] orders faxed to [facility].          Expected Discharge Plan and Services           Expected Discharge Date: 03/28/19                                     Social Determinants of Health (SDOH) Interventions    Readmission Risk Interventions No flowsheet data found.

## 2019-03-28 NOTE — Progress Notes (Signed)
Occupational Therapy Treatment Patient Details Name: Tanner [name] MRN: [id number] DOB: [birth year] Today's Date: 03/28/2019    History of present illness 85 year old man admitted with sepsis secondary to UTI.  Presented with fever, generalized weakness. ER doc was told he tested + for COVID 2-3 weeks prior to admission.  Tested negative here.  PMH:  CAD with angioplasty, CABG, TIA and neuropathy   OT comments  Pt progressing toward stated goals, focused session on ability to safely complete functional mobility a household distance and ADL participation. Pt shows some difficulty following multi step commands, when reworded to single step pt had no difficulty. He did appear to have some STM deficits, but suspect this due to advanced age and not of huge concern. Pt completed functional mobility in hall for household distance considering his previous mod I level of ind. He attempted with RW and no DME, showing he needs external support to safely maintain balance. He experienced dyspnea 2/4 but O2 sats remained stable despite his mild c/o SOB. Overall believe pt is safe to return to ILF with HHOT support. Will continue to follow while acutely placed.    Follow Up Recommendations  Home health OT    Equipment Recommendations  None recommended by OT    Recommendations for Other Services      Precautions / Restrictions Precautions Precautions: Fall Restrictions Weight Bearing Restrictions: No       Mobility Bed Mobility Overal bed mobility: Needs Assistance Bed Mobility: Supine to Sit     Supine to sit: Min guard     General bed mobility comments: provided a hand for pt to self assist trunk upright  Transfers Overall transfer level: Needs assistance Equipment used: 1 person hand held assist;Rolling walker (2 wheeled) Transfers: Sit to/from Stand Sit to Stand: Min assist         General transfer comment: assist to power to standing and steady- completed functional mobility with  and without RW    Balance Overall balance assessment: Needs assistance Sitting-balance support: Bilateral upper extremity supported;No upper extremity supported Sitting balance-[name] Scale: Fair Sitting balance - Comments: when using both hands to put socks on, mildly lost balance posteriorly, able to self correct and educated on crossing legs for LB dressing   Standing balance support: Single extremity supported;During functional activity Standing balance-[name] Scale: Poor Standing balance comment: requires UE support                           ADL either performed or assessed with clinical judgement   ADL Overall ADL's : Needs assistance/impaired                     Lower Body Dressing: Min guard;Sit to/from stand;Sitting/lateral leans Lower Body Dressing Details (indicate cue type and reason): to fix socks while seated EOB. Able to bend knee up to reach foot, slightly lost balance so educated on crossing legs Toilet Transfer: Mining engineer Details (indicate cue type and reason): simulated with RW         Functional mobility during ADLs: Min guard;Rolling walker General ADL Comments: pt with 2/4 dyspnea with 97% on RA after completing functional mobility a household distance     Vision Baseline Vision/History: Wears glasses Wears Glasses: At all times Patient Visual Report: No change from baseline     Perception     Praxis      Cognition Arousal/Alertness: Awake/alert Behavior During Therapy: [facility] for  tasks assessed/performed   Area of Impairment: Following commands;Memory                     Memory: Decreased short-term memory Following Commands: Follows multi-step commands inconsistently;Follows multi-step commands with increased time       General Comments: pt at times showing difficulty following multi step cues, when rephrased to single step cues pt more accurate in following.        Exercises      Shoulder Instructions       General Comments      Pertinent Vitals/ Pain       Pain Assessment: No/denies pain  Home Living                                          Prior Functioning/Environment              Frequency  Min 2X/week        Progress Toward Goals  OT Goals(current goals can now be found in the care plan section)  Progress towards OT goals: Progressing toward goals  Acute Rehab OT Goals Patient Stated Goal: to home with wife OT Goal Formulation: With patient Time For Goal Achievement: 04/09/19 Potential to Achieve Goals: Good  Plan      Co-evaluation                 AM-PAC OT "6 Clicks" Daily Activity     Outcome Measure   Help from another person eating meals?: None Help from another person taking care of personal grooming?: A Little Help from another person toileting, which includes using toliet, bedpan, or urinal?: A Little Help from another person bathing (including washing, rinsing, drying)?: A Little Help from another person to put on and taking off regular upper body clothing?: A Little Help from another person to put on and taking off regular lower body clothing?: A Little 6 Click Score: 19    End of Session Equipment Utilized During Treatment: Gait belt;Rolling walker  OT Visit Diagnosis: Unsteadiness on feet (R26.81);Other abnormalities of gait and mobility (R26.89)   Activity Tolerance Patient tolerated treatment well   Patient Left in chair;with call bell/phone within reach;with chair alarm set   Nurse Communication          Time: 1029-1050 OT Time Calculation (min): 21 min  Charges: OT General Charges $OT Visit: 1 Visit OT Treatments $Self Care/Home Management : 8-22 mins  Zenovia Jarred, MSOT, OTR/L [place] Office: [place] 03/28/2019, 12:19 PM

## 2019-03-28 NOTE — Plan of Care (Signed)
  Problem: Health Behavior/Discharge Planning: Goal: Ability to manage health-related needs will improve Outcome: Adequate for Discharge   Problem: Clinical Measurements: Goal: Ability to maintain clinical measurements within normal limits will improve Outcome: Adequate for Discharge Goal: Will remain free from infection Outcome: Adequate for Discharge Goal: Diagnostic test results will improve Outcome: Adequate for Discharge Goal: Respiratory complications will improve Outcome: Adequate for Discharge Goal: Cardiovascular complication will be avoided Outcome: Adequate for Discharge   Problem: Activity: Goal: Risk for activity intolerance will decrease Outcome: Adequate for Discharge   Problem: Nutrition: Goal: Adequate nutrition will be maintained Outcome: Adequate for Discharge   Problem: Coping: Goal: Level of anxiety will decrease Outcome: Adequate for Discharge   Problem: Elimination: Goal: Will not experience complications related to bowel motility Outcome: Adequate for Discharge Goal: Will not experience complications related to urinary retention Outcome: Adequate for Discharge   Problem: Pain Managment: Goal: General experience of comfort will improve Outcome: Adequate for Discharge   Problem: Safety: Goal: Ability to remain free from injury will improve Outcome: Adequate for Discharge   Problem: Skin Integrity: Goal: Risk for impaired skin integrity will decrease Outcome: Adequate for Discharge   Problem: Urinary Elimination: Goal: Signs and symptoms of infection will decrease Outcome: Adequate for Discharge

## 2019-03-30 LAB — CULTURE, BLOOD (ROUTINE X 2)
Culture: NO GROWTH
Culture: NO GROWTH
Special Requests: ADEQUATE
Special Requests: ADEQUATE

## 2019-07-12 ENCOUNTER — Other Ambulatory Visit: Payer: Self-pay

## 2019-07-12 ENCOUNTER — Ambulatory Visit (INDEPENDENT_AMBULATORY_CARE_PROVIDER_SITE_OTHER): Payer: Medicare Other | Admitting: Cardiology

## 2019-07-12 ENCOUNTER — Encounter: Payer: Self-pay | Admitting: Cardiology

## 2019-07-12 VITALS — BP 116/82 | HR 60 | Ht 70.5 in | Wt 155.8 lb

## 2019-07-12 DIAGNOSIS — E785 Hyperlipidemia, unspecified: Secondary | ICD-10-CM | POA: Diagnosis not present

## 2019-07-12 DIAGNOSIS — I351 Nonrheumatic aortic (valve) insufficiency: Secondary | ICD-10-CM

## 2019-07-12 DIAGNOSIS — Z955 Presence of coronary angioplasty implant and graft: Secondary | ICD-10-CM | POA: Diagnosis not present

## 2019-07-12 DIAGNOSIS — I2510 Atherosclerotic heart disease of native coronary artery without angina pectoris: Secondary | ICD-10-CM | POA: Diagnosis not present

## 2019-07-12 DIAGNOSIS — R001 Bradycardia, unspecified: Secondary | ICD-10-CM

## 2019-07-12 DIAGNOSIS — G459 Transient cerebral ischemic attack, unspecified: Secondary | ICD-10-CM | POA: Diagnosis not present

## 2019-07-12 DIAGNOSIS — I420 Dilated cardiomyopathy: Secondary | ICD-10-CM

## 2019-07-12 MED ORDER — METOPROLOL TARTRATE 25 MG PO TABS: 12.5000 mg | ORAL_TABLET | Freq: Two times a day (BID) | ORAL | 0 refills | Status: DC

## 2019-07-12 NOTE — Patient Instructions (Signed)
Medication Instructions:  No changes    Lab Work: Not needed   Testing/Procedures: Not needed  Follow-Up: At [facility], you and your health needs are our priority.  As part of our continuing mission to provide you with exceptional heart care, we have created designated Provider Care Teams.  These Care Teams include your primary Cardiologist (physician) and Advanced Practice Providers (APPs -  Physician Assistants and Nurse Practitioners) who all work together to provide you with the care you need, when you need it.  Your next appointment:   6 months  The format for your next appointment:   In Person  Provider:   Glenetta Hew, MD  Other Instructions

## 2019-07-12 NOTE — Progress Notes (Signed)
Primary Care Provider: Deland Pretty, MD Cardiologist: Glenetta Hew, MD Electrophysiologist:   Clinic Note: Chief Complaint  Patient presents with  . Hospitalization Follow-up  . Coronary Artery Disease    no cardiac symptoms   HPI:    Tanner [name] is a 85 y.o. male with a PMH below who presents today for delayed clinic & now Hospital F/u.  Recurrent class IV angina in February 2012 --> Cath = diffuse RCA disease as well as LAD disease (significant ISR of LAD BMS) --> referred for 2 vessel CABG.   He had transient postoperative A. Fib at that time but no recurrences after cardioversion.   In April 2015 he had a TIA and was noted to have moderate to severe reduction in his EF.   This proved to be transient with an improved EF on followup echocardiogram in August. He does mild aortic insufficiency is noted on echocardiogram to evaluation.   Myoview June 2015: small apical anterolateral defect it was probably consistent with known occlusion of the diagonal filled by collaterals. This is been treated medically and he is not that symptomatic. 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   Theresia Majors was last seen on back in March 2019.  He was doing well.  Still very active doing "electrical repairs, and other odd jobs as well as routine exercise, etc.  Still doing work through [employer].  Only noted mild heart flip-flopping.  He was seen by Estella Husk, PA  via telemedicine in May for preoperative clearance.  Was okay to hold Plavix.  Recent Hospitalizations:   Jan 26, 2019: Outpatient in hospital treatment for bladder cancer.  Cystoscopy, urethral dilation.  July 21st 2020: Sent to ER (from assisted living[facility]) for high-grade fever weakness and altered mental status.  -->  Apparently had tested positive for COVID-19 2 to 3 weeks before, but was asymptomatic.  Was noted to  have significant UTI related sepsis with E. coli bacteremia.  Was not troubled by any cardiac issues.  Reviewed  CV studies:    The following studies were reviewed today: (if available, images/films reviewed: From Epic Chart or Care Everywhere) . No further studies.   Interval History:   Tanner [name] returns today, basically fully recovered from his recent hospitalization.  He is back fully active.  Good urine output.  No issues with dysuria.  He is doing at least a half an hour of walking and exercise daily.  Still doing therapy.  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  CV Review of Symptoms (Summary)  no chest pain or dyspnea on exertion negative for - edema, irregular heartbeat, orthopnea, palpitations, paroxysmal nocturnal dyspnea, rapid heart rate, shortness of breath or Syncope/near syncope, TIA shows amaurosis fugax.  Claudication.  The patient does not have symptoms concerning for COVID-19 infection (fever, chills, cough, or new shortness of breath).  The patient is practicing social distancing. ++ Masking.  He is social, but maintains distance.  Not sure how he was positive for Covid, but had no symptoms.  REVIEWED OF SYSTEMS   A comprehensive ROS was performed. Review of Systems  Constitutional: Negative for malaise/fatigue and weight loss.  HENT: Negative for congestion and nosebleeds.   Respiratory: Negative for  shortness of breath.   Cardiovascular: Negative for leg swelling.  Gastrointestinal: Negative for blood in stool, heartburn and melena.  Genitourinary: Negative for hematuria.       No further urinary issues  Musculoskeletal: Negative for falls and joint pain (Minor arthritis pains.).  Neurological: Negative for dizziness.  Endo/Heme/Allergies: Negative for environmental allergies.   Psychiatric/Behavioral: Negative for memory loss. The patient is not nervous/anxious and does not have insomnia.    I have reviewed and (if needed) personally updated the patient's problem list, medications, allergies, past medical and surgical history, social and family history.   PAST MEDICAL HISTORY   Past Medical History:  Diagnosis Date  . Anticoagulant long-term use    plavix  (for hx TIA)  . Arthritis    right shoulder   . Benign essential tremor    hands -- occasional  . CAD S/P percutaneous coronary angioplasty cardiologist-- dr harding   a) PCI and BMS to LAD in 1991; b) CATH 10/2010: Severe 2 Vessel disease = diffuse RCA ( 75% prox, 90% mid & 70 + 75% distal) & LAD (80-90% pre-stent, 75% post-stent) with occluded D1 (filled via OM-D1 collaterals)  -- CABG x 2; c) Myoview 4/'15: INTERMEDIATE RISK - small sized reversible apical/anterolateral defect -- most likely c/w known occlusion of D1 with collaterals.(med Rx)  . History of basal cell carcinoma (BCC) excision   . History of bladder cancer urologist-- dr eskridge   dx 11/ 2015-- s/p TURBT  . History of kidney stones   . History of TIA (transient ischemic attack) 12/09/2013  . History of urethral stricture   . Hyperlipidemia with target LDL less than 70    controlled.  . Neuropathy of lower extremity   . Nocturia   . Osteoporosis   . Peripheral neuropathy    lower extremity  . Postoperative atrial fibrillation ([place]) 10/2010   after bypass-"shocked back in and no problems since":   per cardiologist note, dr harding, no recurrence  . S/P CABG x 2 10/18/2010   LIMA-LAD, SVG to RPDA (Dr. [name])  . TGA (transient global amnesia)    "x3 for hours then goes away"  . 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    [name] Kitchen Wears glasses   . Wears partial  dentures    upper     PAST SURGICAL HISTORY   Past Surgical History:  Procedure Laterality Date  . APPENDECTOMY  as teen  . BASAL CELL CARCINOMA EXCISION  07/2014  . CARDIAC CATHETERIZATION  10/17/2010   80%prox w/mild in-stent restenosis w/75% mid-LADstenosis aft the stent,multi severe stenoses RCAw/heavily calcified vessel,norm LV  . CARDIOVERSION  2012   after bypass surgery  . CATARACT EXTRACTION W/ INTRAOCULAR LENS  IMPLANT, BILATERAL  2009  approx.  . COCCYX REMOVAL    . CORONARY ANGIOPLASTY WITH STENT PLACEMENT  1991   PCI and BMS to LAD  . CORONARY ARTERY BYPASS GRAFT  10-18-2010  dr Ricard Dillon @[facility]    LIMA-LAD, SVG-rPDA  . CYSTOSCOPY W/ RETROGRADES N/A 07/12/2014   Procedure: CYSTOSCOPY WITH RETROGRADE PYELOGRAM;  Surgeon: [name], MD;  Location: [facility];  Service: Urology;  Laterality: N/A;  . CYSTOSCOPY WITH BIOPSY N/A 09/09/2014   Procedure: CYSTO WITH BIOPSY AND FULGERATION, DILATION OF URETHRAL STRICTURE;  Surgeon: [name], MD;  Location: [facility];  Service: Urology;  Laterality: N/A;  BLADDER BIOPSY  . CYSTOSCOPY WITH BIOPSY N/A 01/03/2015   Procedure: CYSTOSCOPY WITH BIOPSY;  Surgeon: [name], MD;  Location: [facility];  Service: Urology;  Laterality: N/A;  . CYSTOSCOPY WITH BIOPSY N/A 01/26/2019   Procedure: CYSTOSCOPY WITH BIOPSY, BILATERAL RETROGRADE PYELOGRAM;  Surgeon: [name], MD;  Location: [facility];  Service: Urology;  Laterality: N/A;  . CYSTOSCOPY WITH FULGERATION N/A 01/26/2019   Procedure: CYSTOSCOPY WITH FULGERATION;  Surgeon: [name], MD;  Location: [facility];  Service: Urology;  Laterality: N/A;  . CYSTOSCOPY WITH URETHRAL DILATATION N/A 01/03/2015   Procedure: CYSTOSCOPY WITH URETHRAL DILATATION;  Surgeon: [name], MD;  Location: [facility];  Service: Urology;  Laterality: N/A;  . DOPPLER ECHOCARDIOGRAPHY  10/18/2004   EF 55-65%,BORDERLINE -enlarged aortic root,mild aortic insuff.,trace  tricus.insuff.  [name] Kitchen FINGER SURGERY Left 03-17-2001   dr Burney Gauze  @[facility]    repair complex laceration injury 5th digit  . [place]  . [place] MYOVIEW LTD  12/16/2013   INTERMEDIATE RISK test with small sized reversible perfusion defect in the apical anterolateral wall -- most likely consistent with known occlusion of D1 with collaterals.  . shoulder surgery Right 1988  . TONSILLECTOMY  as teen  . TRANSTHORACIC ECHOCARDIOGRAM  April 2015   EF 35-40%, mild AI, moderately dilated ascending aorta  . TRANSURETHRAL RESECTION OF PROSTATE N/A 07/12/2014   Procedure: TRANSURETHRAL RESECTION OF THE PROSTATE (TURP) WITH MITOMYCIN -C;  Surgeon: [name], MD;  Location: [facility];  Service: Urology;  Laterality: N/A;     MEDICATIONS/ALLERGIES   Current Meds  Medication Sig  . atorvastatin (LIPITOR) 80 MG tablet Take 80 mg by mouth at bedtime.   . clopidogrel (PLAVIX) 75 MG tablet Take 1 tablet (75 mg total) by mouth daily.  . fish oil-omega-3 fatty acids 1000 MG capsule Take 1 g by mouth daily.   . metoprolol tartrate (LOPRESSOR) 25 MG tablet Take 0.5 tablets (12.5 mg total) by mouth 2 (two) times daily.  . Multiple Vitamin (MULTIVITAMIN) capsule Take 1 capsule by mouth at bedtime.   . [DISCONTINUED] metoprolol tartrate (LOPRESSOR) 25 MG tablet Take 1 tablet (25 mg total) by mouth 2 (two) times daily.    No Known Allergies   SOCIAL HISTORY/FAMILY HISTORY   Social History   Tobacco Use  . Smoking status: Former Smoker    Packs/day: 1.00    Years: 5.00    Pack years: 5.00    Types: Cigarettes    Quit date: 09/02/1957    Years since quitting: 61.9  . Smokeless tobacco: Never Used  Substance Use Topics  . Alcohol use: Never    Alcohol/week: 0.0 standard drinks    Frequency: Never  . Drug use: Never   Social History   Social History Narrative   Pt lives at home with spouse.   Caffeine Use: very little    Family History family history includes Heart Problems in his  father and mother; Parkinson's disease in his father.   OBJCTIVE -PE, EKG, labs   Wt Readings from Last 3 Encounters:  07/12/19 155 lb 12.8 oz (70.7 kg)  03/28/19 155 lb 6.8 oz (70.5 kg)  01/26/19 152 lb 8 oz (69.2 kg)    Physical Exam: BP 116/82   Pulse 60   Ht 5' 10.5" (1.791 m)   Wt 155 lb 12.8 oz (70.7 kg)   SpO2 97%   BMI 22.04 kg/m  Physical Exam  Constitutional: He is oriented to  person, place, and time. He appears well-developed and well-nourished. No distress.  Very healthy-appearing elderly gentleman.  HENT:  Head: Normocephalic.  Neck: Normal range of motion. Neck supple. No hepatojugular reflux and no JVD present. Carotid bruit is not present.  Cardiovascular: Normal rate and regular rhythm.  No extrasystoles are present. PMI is not displaced.  Murmur (2/6 SEM @ RUSB with soft DM) heard. Pulmonary/Chest: Effort normal and breath sounds normal. No respiratory distress. He has no wheezes. He has no rales.  Abdominal: Soft. Bowel sounds are normal. He exhibits no distension. There is no abdominal tenderness. There is no rebound.  Musculoskeletal: Normal range of motion.        General: No edema.     Comments: Has a slight foot drop with walking.  Neurological: He is alert and oriented to person, place, and time.  Psychiatric: He has a normal mood and affect. His behavior is normal. Judgment and thought content normal.  Vitals reviewed.    Adult ECG Report  Rate: 60 ;  Rhythm: normal sinus rhythm and LVH with repolarization changes.  otherwise normal axis, intervals & durations.;   Narrative Interpretation: stable  Recent Labs:  12/09/2018  TC 142, TG 113, HDL 40, LDL 77.  A1c 7.1.  Lab Results  Component Value Date   CREATININE 0.66 03/28/2019   BUN 13 03/28/2019   NA 138 03/28/2019   K 4.0 03/28/2019   CL 107 03/28/2019   CO2 20 (L) 03/28/2019    ASSESSMENT/PLAN    Problem List Items Addressed This Visit    Presence of bare metal stent in LAD  coronary artery: With significant stenosis on either end of the stent (Chronic)    No need for Plavix as he now has a LIMA to LAD graft.  Plavix is more for TIA.      Relevant Orders   EKG 12-Lead (Completed)   Dyslipidemia, goal LDL below 70 (Chronic)    Is on high-dose statin.  Most recent LDL is pretty well controlled.  Would not further treat in order to to avoid additional medications.      Relevant Medications   metoprolol tartrate (LOPRESSOR) 25 MG tablet   Mild aortic insufficiency (Chronic)    Barely able to hear on exam.  At age 85, no plans to recheck.      Relevant Medications   metoprolol tartrate (LOPRESSOR) 25 MG tablet   Transient Congestive dilated cardiomyopathy (Chronic)    Improved EF back to normal following CABG.  No recurrence of heart failure symptoms.  Only on low-dose beta-blocker.  No CHF symptoms.      Relevant Medications   metoprolol tartrate (LOPRESSOR) 25 MG tablet   TIA (transient ischemic attack) (Chronic)    As far as I can tell, he is on Plavix more for his TIA than actually for CAD.  Therefore is on Plavix for maintenance..      Relevant Medications   metoprolol tartrate (LOPRESSOR) 25 MG tablet   Sinus bradycardia (Chronic)    Stable.  Not able to titrate beta-blocker.      Relevant Medications   metoprolol tartrate (LOPRESSOR) 25 MG tablet   Other Relevant Orders   EKG 12-Lead (Completed)   Two-vessel CAD status post CABG x2;  - Primary (Chronic)    No cardiac symptoms to speak of since CABG.  Tolerated his sepsis very well with no cardiac issues. He is on stable dose of atorvastatin and low-dose Lopressor. On maintenance clopidogrel which can be held for  procedures.      Relevant Medications   metoprolol tartrate (LOPRESSOR) 25 MG tablet   Other Relevant Orders   EKG 12-Lead (Completed)      COVID-19 Education: The signs and symptoms of COVID-19 were discussed with the patient and how to seek care for testing (follow up  with PCP or arrange E-visit).   The importance of social distancing was discussed today.  I spent a total of 18 minutes with the patient and chart review. >  50% of the time was spent in direct patient consultation.  Additional time spent with chart review (studies, outside notes, etc): 9 Total Time: 27 min   Current medicines are reviewed at length with the patient today.  (+/- concerns) n/a   Patient Instructions / Medication Changes & Studies & Tests Ordered   Patient Instructions  Medication Instructions:  No changes    Lab Work: Not needed   Testing/Procedures: Not needed  Follow-Up: At [facility], you and your health needs are our priority.  As part of our continuing mission to provide you with exceptional heart care, we have created designated Provider Care Teams.  These Care Teams include your primary Cardiologist (physician) and Advanced Practice Providers (APPs -  Physician Assistants and Nurse Practitioners) who all work together to provide you with the care you need, when you need it.  Your next appointment:   6 months  The format for your next appointment:   In Person  Provider:   Glenetta Hew, MD  Other Instructions    Studies Ordered:   Orders Placed This Encounter  Procedures  . EKG 12-Lead     Glenetta Hew, M.D., M.S. Interventional Cardiologist   Pager # [phone] Phone # [phone] [address] [place], [place] [address]   Thank you for choosing Heartcare at [facility]!!

## 2019-07-13 ENCOUNTER — Encounter: Payer: Self-pay | Admitting: Cardiology

## 2019-07-13 NOTE — Assessment & Plan Note (Signed)
No need for Plavix as he now has a LIMA to LAD graft.  Plavix is more for TIA.

## 2019-07-13 NOTE — Assessment & Plan Note (Signed)
No cardiac symptoms to speak of since CABG.  Tolerated his sepsis very well with no cardiac issues. He is on stable dose of atorvastatin and low-dose Lopressor. On maintenance clopidogrel which can be held for procedures.

## 2019-07-13 NOTE — Assessment & Plan Note (Signed)
Barely able to hear on exam.  At age 85, no plans to recheck.

## 2019-07-13 NOTE — Assessment & Plan Note (Signed)
As far as I can tell, he is on Plavix more for his TIA than actually for CAD.  Therefore is on Plavix for maintenance.Tanner [name]

## 2019-07-13 NOTE — Assessment & Plan Note (Signed)
Is on high-dose statin.  Most recent LDL is pretty well controlled.  Would not further treat in order to to avoid additional medications.

## 2019-07-13 NOTE — Assessment & Plan Note (Signed)
Improved EF back to normal following CABG.  No recurrence of heart failure symptoms.  Only on low-dose beta-blocker.  No CHF symptoms.

## 2019-07-13 NOTE — Assessment & Plan Note (Signed)
Stable.  Not able to titrate beta-blocker.

## 2019-12-17 ENCOUNTER — Other Ambulatory Visit: Payer: Self-pay

## 2019-12-17 MED ORDER — METOPROLOL TARTRATE 25 MG PO TABS: 12.5000 mg | ORAL_TABLET | Freq: Two times a day (BID) | ORAL | 0 refills | Status: DC

## 2020-01-19 IMAGING — CT CT ANGIOGRAPHY CHEST
2 of 6 series · 18 of 46 positions shown · IV contrast (omnipaque)
Comparison: 12/11/2013

CLINICAL DATA: PE suspected, fever

EXAM:
CT ANGIOGRAPHY CHEST WITH CONTRAST
TECHNIQUE: Multidetector CT imaging of the chest was performed using the
standard protocol during bolus administration of intravenous
contrast. Multiplanar CT image reconstructions and MIPs were
obtained to evaluate the vascular anatomy.
CONTRAST:  100mL OMNIPAQUE IOHEXOL 350 MG/ML SOLN

[Series 5: thins · axial · 0.68mm/px · z∈[+1203,+1455]mm · 15 of 278 slices shown]
[im 13/278  lung]
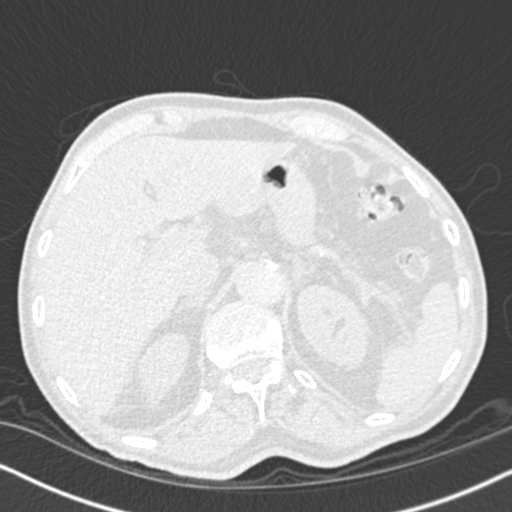
[im 37/278  soft-tissue]
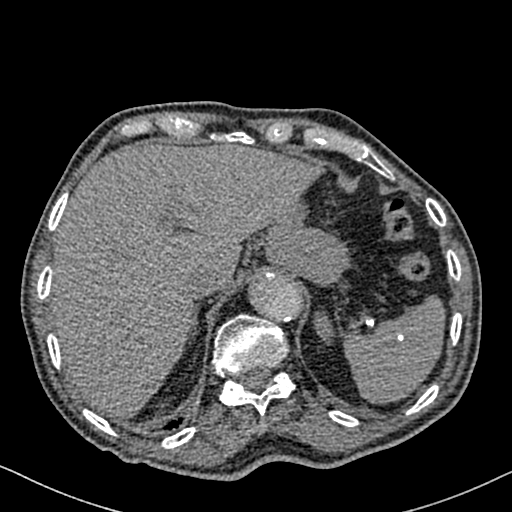
[im 49/278  lung]
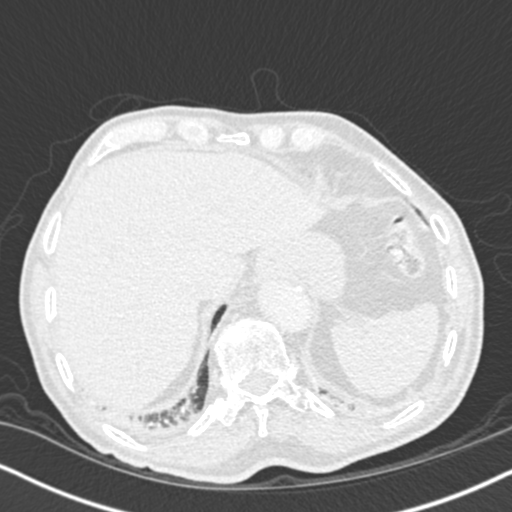
[im 73/278  soft-tissue]
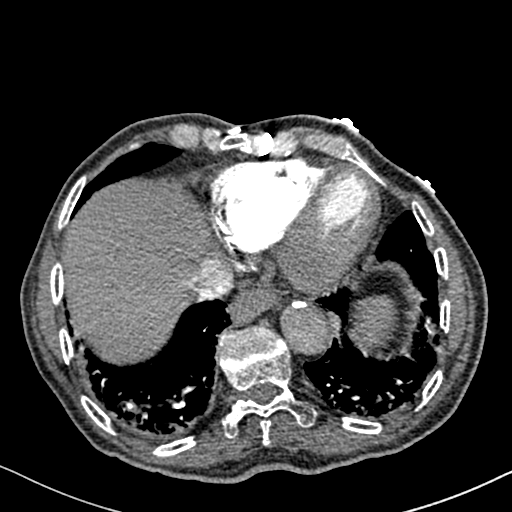
[im 85/278  lung]
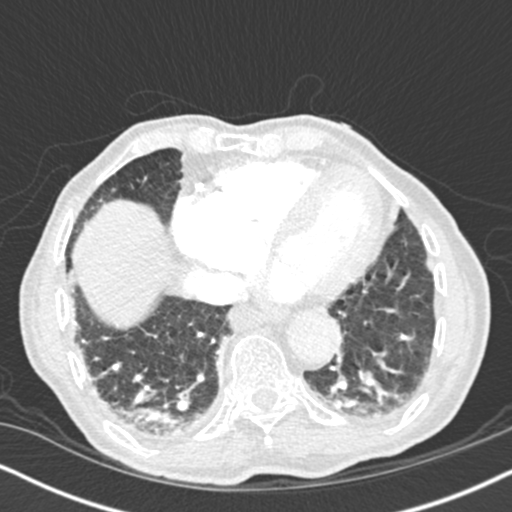
[im 109/278  soft-tissue]
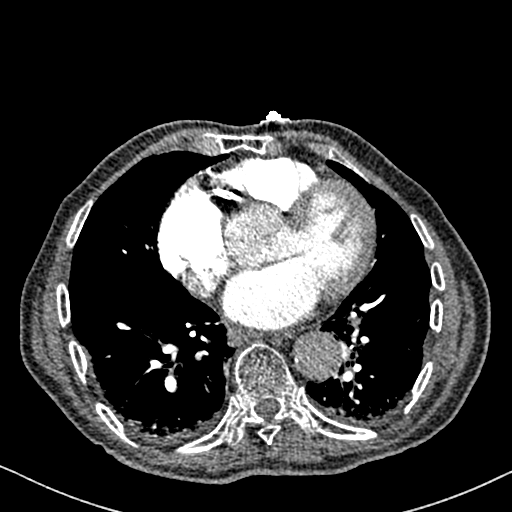
[im 121/278  lung]
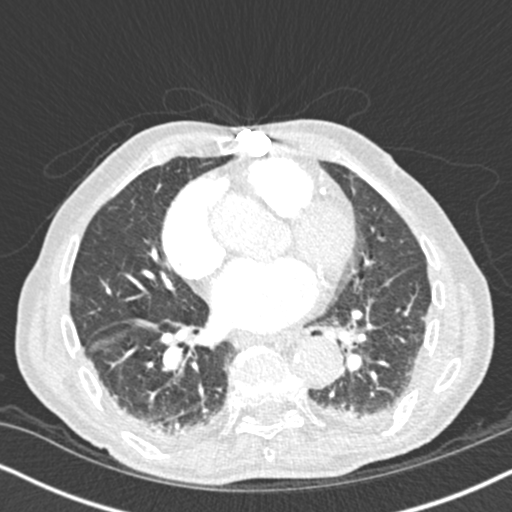
[im 145/278  soft-tissue]
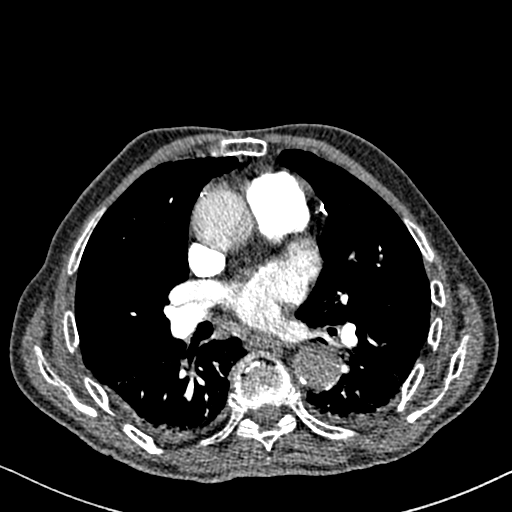
[im 157/278  lung]
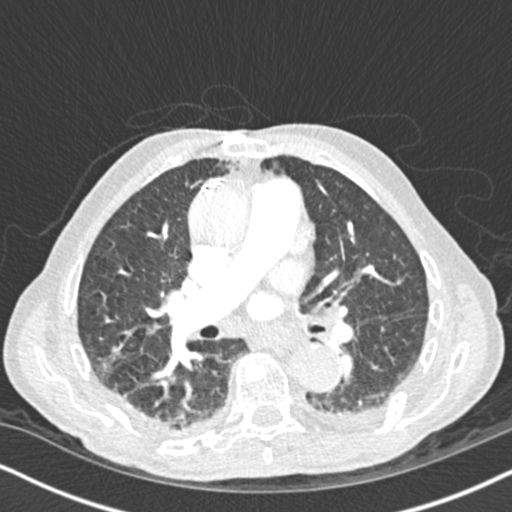
[im 169/278  soft-tissue]
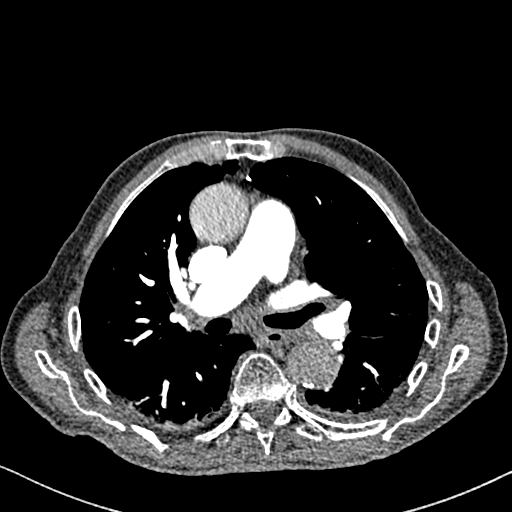
[im 193/278  lung]
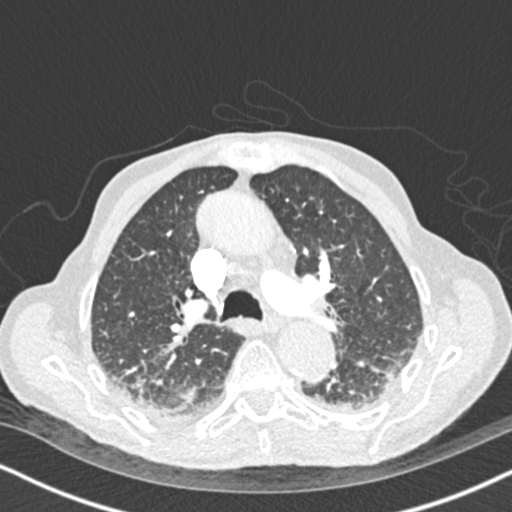
[im 205/278  soft-tissue]
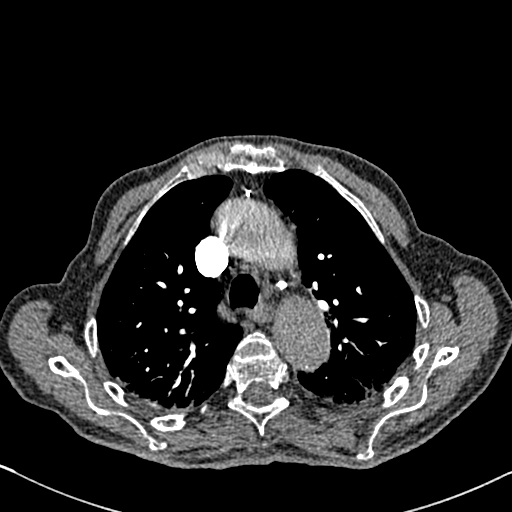
[im 229/278  lung]
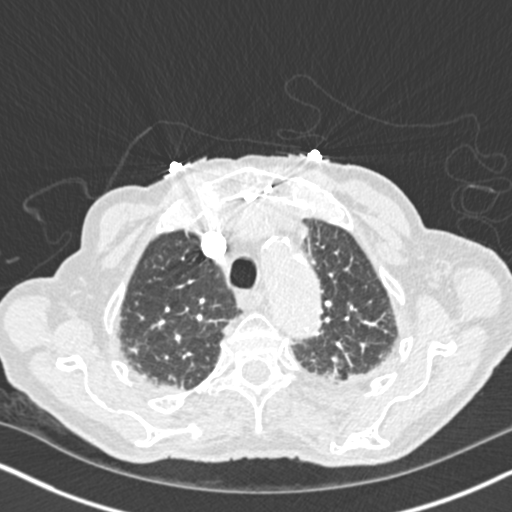
[im 241/278  soft-tissue]
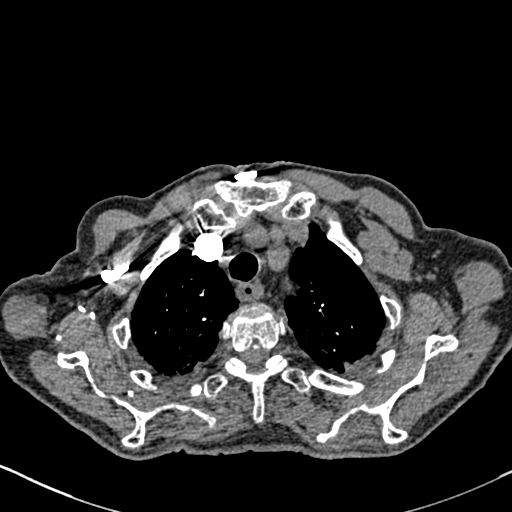
[im 265/278  lung]
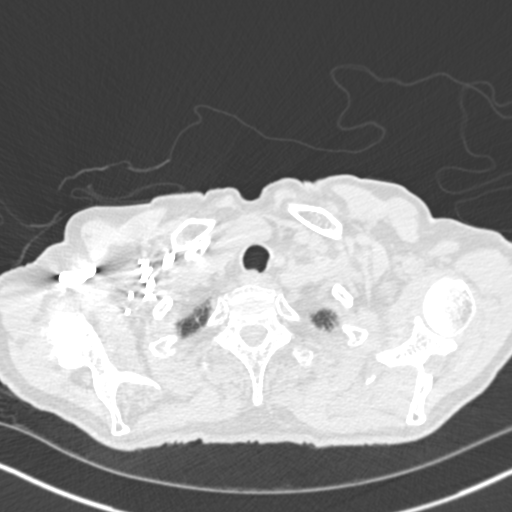

[Series 7: coronal mpr · coronal · 0.58mm/px · 3 of 125 slices shown]
[im 32/125  soft-tissue]
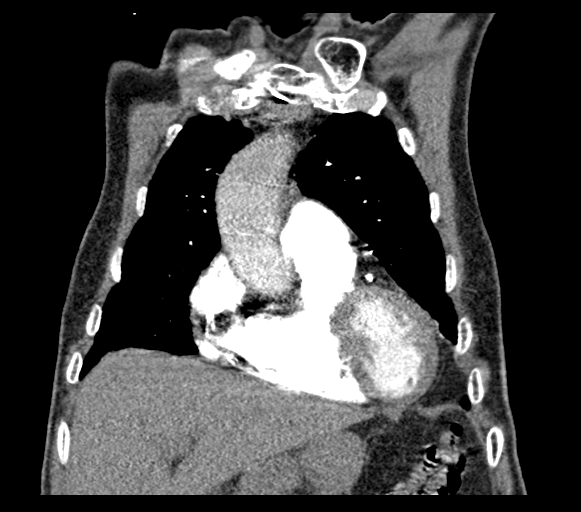
[im 63/125  soft-tissue]
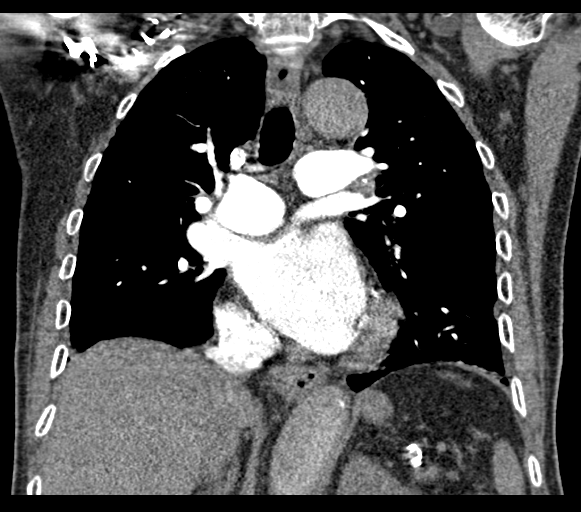
[im 94/125  soft-tissue]
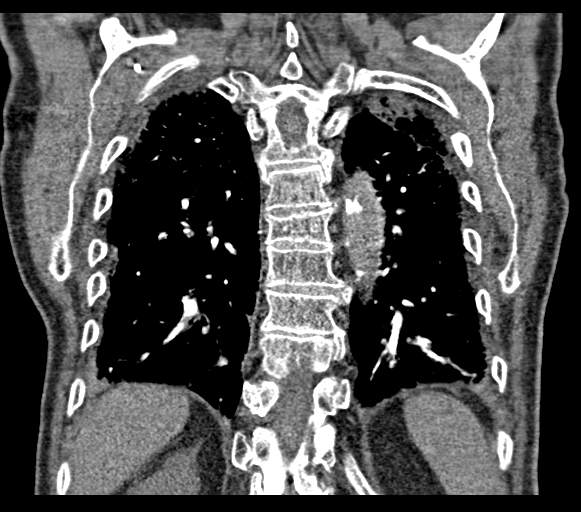

[18 of 46 positions shown; findings below may reference images not displayed]

FINDINGS: Cardiovascular: Satisfactory opacification of the pulmonary arteries
to the segmental level. No evidence of pulmonary embolism.
Cardiomegaly. Three-vessel coronary artery calcifications and/or
stents status post median sternotomy and CABG. Aortic
atherosclerosis. No pericardial effusion.

Mediastinum/Nodes: Calcified AP window lymph nodes, consistent with
prior granulomatous infection. Small hiatal hernia. Thyroid gland,
trachea, and esophagus demonstrate no significant findings.

Lungs/Pleura: Trace bilateral pleural effusions with associated
bibasilar heterogeneous opacity and consolidation and/or
atelectasis. There is diffuse bilateral bronchial wall thickening,
worst in the dependent lower lobes.

Upper Abdomen: No acute abnormality.

Musculoskeletal: No chest wall abnormality. No acute or significant
osseous findings. Nonacute wedge deformity of the T7 vertebral body.

Review of the MIP images confirms the above findings.
IMPRESSION: 1.  Negative examination for pulmonary embolism.

2. Trace bilateral pleural effusions and associated bibasilar
heterogeneous opacity and consolidation and/or atelectasis. There is
diffuse bilateral bronchial wall thickening, worst in the dependent
lower lobes. Findings are suggestive of aspiration, which may be
chronic or ongoing.

3. Cardiomegaly, coronary artery disease, and aortic
atherosclerosis.

## 2020-01-20 ENCOUNTER — Ambulatory Visit (INDEPENDENT_AMBULATORY_CARE_PROVIDER_SITE_OTHER): Payer: Medicare Other | Admitting: Cardiology

## 2020-01-20 ENCOUNTER — Other Ambulatory Visit: Payer: Self-pay

## 2020-01-20 ENCOUNTER — Encounter: Payer: Self-pay | Admitting: Cardiology

## 2020-01-20 VITALS — BP 146/72 | HR 51 | Ht 70.5 in | Wt 155.0 lb

## 2020-01-20 DIAGNOSIS — I2510 Atherosclerotic heart disease of native coronary artery without angina pectoris: Secondary | ICD-10-CM | POA: Diagnosis not present

## 2020-01-20 DIAGNOSIS — R001 Bradycardia, unspecified: Secondary | ICD-10-CM | POA: Diagnosis not present

## 2020-01-20 DIAGNOSIS — Z955 Presence of coronary angioplasty implant and graft: Secondary | ICD-10-CM | POA: Diagnosis not present

## 2020-01-20 DIAGNOSIS — I351 Nonrheumatic aortic (valve) insufficiency: Secondary | ICD-10-CM

## 2020-01-20 DIAGNOSIS — E785 Hyperlipidemia, unspecified: Secondary | ICD-10-CM | POA: Diagnosis not present

## 2020-01-20 MED ORDER — ATORVASTATIN CALCIUM 40 MG PO TABS: 40.0000 mg | ORAL_TABLET | Freq: Every day | ORAL | 3 refills | Status: DC

## 2020-01-20 NOTE — Patient Instructions (Signed)
Medication Instructions:      DECREASE ATORVASTATIN TO 40 MG FROM 80 MG  ( COMPLETE BOTTLE)   NEW PRESCRIPTION SENT TO [PHARMACY FOR 40 MG ATORVASTATIN   NO OTHER  CHANGES  *If you need a refill on your cardiac medications before your next appointment, please call your pharmacy*   Lab Work: NOT NEEDED   Testing/Procedures: NOT NEEDED   Follow-Up: At [facility], you and your health needs are our priority.  As part of our continuing mission to provide you with exceptional heart care, we have created designated Provider Care Teams.  These Care Teams include your primary Cardiologist (physician) and Advanced Practice Providers (APPs -  Physician Assistants and Nurse Practitioners) who all work together to provide you with the care you need, when you need it.  We recommend signing up for the patient portal called "MyChart".  Sign up information is provided on this After Visit Summary.  MyChart is used to connect with patients for Virtual Visits (Telemedicine).  Patients are able to view lab/test results, encounter notes, upcoming appointments, etc.  Non-urgent messages can be sent to your provider as well.   To learn more about what you can do with MyChart, go to [email or web address].    Your next appointment:   6 month(s)  The format for your next appointment:   In Person  Provider:   Glenetta Hew, MD

## 2020-01-20 NOTE — Progress Notes (Signed)
Primary Care Provider: Deland Pretty, [name] Cardiologist: Tanner [name] Electrophysiologist: None  Clinic Note: Chief Complaint  Patient presents with  . Follow-up    6 months  . Coronary Artery Disease    No active angina    HPI:    Tanner [name] is a 86 y.o. male with a PMH below who presents today for routine 48-month follow-up   Recurrent class IV angina in February 2012 -->Cath = diffuse RCA disease as well as LAD disease (significant ISR of LAD BMS) -->referred for 2 vessel CABG.   He had transient postoperative A. Fib at that time but no recurrences after cardioversion.   In April 2015 he had a TIA and was noted to have moderate to severe reduction in his EF.   This proved to be transient with an improved EF on followup echocardiogram in August. He does mild aortic insufficiency is noted on echocardiogram to evaluation.   Myoview June 2015: small apical anterolateral defect it was probably consistent with known occlusion of the diagonal filled by collaterals. This is been treated medically and he is not that symptomatic. 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  Delivers packages etc across [place] was last seen on July 12, 2019--he noted he was essentially fully recovered after his hospitalization and never had any symptoms during his COVID-19 "infection".  He was looking to get back into doing his routine jobs and projects that were being limited by the COVID-19 restrictions.  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  Recent Hospitalizations: None  Reviewed  CV studies:    The following studies were reviewed today: (if available, images/films reviewed: From Epic Chart or Care Everywhere) . No new studies:  Interval History:   Tanner [name] returns here today for 43-month follow-up noting that about the only thing that is going on is that he is having some mild short-term  memory issues.  He says he must be getting a little older.  Otherwise, he is his same routine self.  No active heart symptoms.  When he does note disease otherwise has to rest a little more that he used to have to but otherwise has not been anything stop him.  CV Review of Symptoms (Summary) Cardiovascular ROS: no chest pain or dyspnea on exertion negative for - edema, irregular heartbeat, orthopnea, palpitations, paroxysmal nocturnal dyspnea, rapid heart rate, shortness of breath or Syncope/near syncope, TIA/amaurosis fugax, claudication  The patient does not have symptoms concerning for COVID-19 infection (fever, chills, cough, or new shortness of breath).  The patient is practicing social distancing & Masking.   He got his COVID-19 vaccine injections very early on   REVIEWED OF SYSTEMS   Review of Systems  Constitutional: Negative for malaise/fatigue and weight loss.  HENT: Negative for congestion and nosebleeds.   Respiratory: Negative for shortness of breath and wheezing.   Cardiovascular: Negative for leg swelling.  Gastrointestinal: Negative for abdominal pain, blood in stool and melena.  Genitourinary: Negative for hematuria.  Musculoskeletal: Positive for joint pain (Stable arthritis pains).  Endo/Heme/Allergies: Negative for environmental allergies.  Psychiatric/Behavioral: Positive for memory loss (Starting to forget things every now and then). Negative for depression. The patient is not nervous/anxious and does not have insomnia.    I have reviewed and (if needed) personally updated the patient's problem list, medications, allergies, past medical and surgical history, social and family history.   PAST MEDICAL HISTORY  Past Medical History:  Diagnosis Date  . Anticoagulant long-term use    plavix  (for hx TIA)  . Arthritis    right shoulder   . Benign essential tremor    hands -- occasional  . CAD S/P percutaneous coronary angioplasty cardiologist-- dr Tanner [name]   a)  PCI and BMS to LAD in 1991; b) CATH 10/2010: Severe 2 Vessel disease = diffuse RCA ( 75% prox, 90% mid & 70 + 75% distal) & LAD (80-90% pre-stent, 75% post-stent) with occluded D1 (filled via OM-D1 collaterals)  -- CABG x 2; c) Myoview 4/'15: INTERMEDIATE RISK - small sized reversible apical/anterolateral defect -- most likely c/w known occlusion of D1 with collaterals.(med Rx)  . History of basal cell carcinoma (BCC) excision   . History of bladder cancer urologist-- dr Tanner [name]   dx 11/ 2015-- s/p TURBT  . History of kidney stones   . History of TIA (transient ischemic attack) 12/09/2013  . History of urethral stricture   . Hyperlipidemia with target LDL less than 70    controlled.  . Neuropathy of lower extremity   . Nocturia   . Osteoporosis   . Peripheral neuropathy    lower extremity  . Postoperative atrial fibrillation (Tanner [name]) 10/2010   after bypass-"shocked back in and no problems since":   per cardiologist note, dr Tanner [name], no recurrence  . S/P CABG x 2 10/18/2010   LIMA-LAD, SVG to RPDA (Tanner [name])  . TGA (transient global amnesia)    "x3 for hours then goes away"  . 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    [name] Kitchen Wears glasses   . Wears partial dentures    upper    PAST SURGICAL HISTORY   Past Surgical History:  Procedure Laterality Date  . APPENDECTOMY  as teen  . BASAL CELL CARCINOMA EXCISION  07/2014  . CARDIAC CATHETERIZATION  10/17/2010   80%prox w/mild in-stent restenosis w/75% mid-LADstenosis aft the stent,multi severe stenoses RCAw/heavily calcified vessel,norm LV  . CARDIOVERSION  2012   after bypass surgery  . CATARACT EXTRACTION W/ INTRAOCULAR LENS  IMPLANT, BILATERAL  2009  approx.  . COCCYX REMOVAL    . CORONARY ANGIOPLASTY WITH STENT PLACEMENT  1991   PCI and BMS to LAD  .  CORONARY ARTERY BYPASS GRAFT  10-18-2010  dr Ricard Dillon @[facility]    LIMA-LAD, SVG-rPDA  . CYSTOSCOPY W/ RETROGRADES N/A 07/12/2014   Procedure: CYSTOSCOPY WITH RETROGRADE PYELOGRAM;  Surgeon: Tanner Aloe, [name];  Location: [facility];  Service: Urology;  Laterality: N/A;  . CYSTOSCOPY WITH BIOPSY N/A 09/09/2014   Procedure: CYSTO WITH BIOPSY AND FULGERATION, DILATION OF URETHRAL STRICTURE;  Surgeon: Tanner Aloe, [name];  Location: [facility];  Service: Urology;  Laterality: N/A;  BLADDER BIOPSY  . CYSTOSCOPY WITH BIOPSY N/A 01/03/2015   Procedure: CYSTOSCOPY WITH BIOPSY;  Surgeon: Tanner Aloe, [name];  Location: [facility];  Service: Urology;  Laterality: N/A;  . CYSTOSCOPY WITH BIOPSY N/A 01/26/2019   Procedure: CYSTOSCOPY WITH BIOPSY, BILATERAL RETROGRADE PYELOGRAM;  Surgeon: Tanner Aloe, [name];  Location[facility];  Service: Urology;  Laterality: N/A;  . CYSTOSCOPY WITH FULGERATION N/A 01/26/2019   Procedure: CYSTOSCOPY WITH FULGERATION;  Surgeon: Tanner Aloe, [name];  Location: [facility];  Service: Urology;  Laterality: N/A;  . CYSTOSCOPY WITH URETHRAL DILATATION N/A 01/03/2015   Procedure:  CYSTOSCOPY WITH URETHRAL DILATATION;  Surgeon: Tanner Aloe, [name];  Location: [facility];  Service: Urology;  Laterality: N/A;  . DOPPLER ECHOCARDIOGRAPHY  10/18/2004   EF 55-65%,BORDERLINE -enlarged aortic root,mild aortic insuff.,trace tricus.insuff.  [name] Kitchen FINGER SURGERY Left 03-17-2001   dr Burney Gauze  @[facility]    repair complex laceration injury 5th digit  . [place]  . NM MYOVIEW LTD  12/16/2013   INTERMEDIATE RISK test with small sized reversible perfusion defect in the apical anterolateral wall -- most likely consistent with known occlusion of D1 with collaterals.  . shoulder surgery Right 1988  . TONSILLECTOMY  as teen  . TRANSTHORACIC ECHOCARDIOGRAM  April 2015   EF 35-40%, mild AI, moderately dilated ascending aorta  . TRANSURETHRAL RESECTION OF PROSTATE N/A 07/12/2014    Procedure: TRANSURETHRAL RESECTION OF THE PROSTATE (TURP) WITH MITOMYCIN -C;  Surgeon: Tanner Aloe, [name];  Location: [facility];  Service: Urology;  Laterality: N/A;    MEDICATIONS/ALLERGIES   Current Meds  Medication Sig  . clopidogrel (PLAVIX) 75 MG tablet Take 1 tablet (75 mg total) by mouth daily.  . fish oil-omega-3 fatty acids 1000 MG capsule Take 1 g by mouth daily.   . metoprolol tartrate (LOPRESSOR) 25 MG tablet Take 0.5 tablets (12.5 mg total) by mouth 2 (two) times daily.  . Multiple Vitamin (MULTIVITAMIN) capsule Take 1 capsule by mouth at bedtime.   . [DISCONTINUED] atorvastatin (LIPITOR) 80 MG tablet Take 80 mg by mouth at bedtime.     No Known Allergies  SOCIAL HISTORY/FAMILY HISTORY   Reviewed in Epic:  Pertinent findings:   Still very active doing Education officer, community, and other odd jobs as well as routine exercise, etc.  Still doing work through [employer].  Only noted mild heart flip-flopping.   OBJCTIVE -PE, EKG, labs   Wt Readings from Last 3 Encounters:  01/20/20 155 lb (70.3 kg)  07/12/19 155 lb 12.8 oz (70.7 kg)  03/28/19 155 lb 6.8 oz (70.5 kg)    Physical Exam: BP (!) 146/72   Pulse (!) 51   Ht 5' 10.5" (1.791 m)   Wt 155 lb (70.3 kg)   BMI 21.93 kg/m  Physical Exam  Constitutional: He is oriented to person, place, and time. He appears well-developed. No distress.  Somewhat thin and frail but otherwise healthy-appearing elderly gentleman.  Starting to look more like his stated age.  Well-groomed  HENT:  Head: Normocephalic and atraumatic.  Neck: No hepatojugular reflux and no JVD present. Carotid bruit is not present.  Cardiovascular: Normal rate, regular rhythm and intact distal pulses. PMI is not displaced. Exam reveals no gallop and no friction rub.  Murmur heard. High-pitched harsh crescendo-decrescendo midsystolic murmur is present with a grade of 2/6 at the upper right sternal border radiating to the  neck. Pulmonary/Chest: Effort normal and breath sounds normal. No respiratory distress. He has no wheezes. He has no rales. He exhibits no tenderness.  Abdominal: Soft. Bowel sounds are normal. He exhibits no distension. There is no abdominal tenderness. There is no rebound.  Musculoskeletal:        General: No edema. Normal range of motion.     Cervical back: Normal range of motion and neck supple.     Comments: Stable foot drop  Neurological: He is alert and oriented to person, place, and time.  Psychiatric: He has a normal mood and affect. His behavior is normal. Judgment and thought content normal.  Vitals reviewed.    Adult ECG Report  Rate:  51;  Rhythm: sinus bradycardia and 1 degree AVB.  Otherwise normal axis, intervals and durations.;   Narrative Interpretation: Stable EKG  Recent Labs: Last lipids were from April 2020 (should be due by PCP.  Lab Results  Component Value Date   CREATININE 0.66 03/28/2019   BUN 13 03/28/2019   NA 138 03/28/2019   K 4.0 03/28/2019   CL 107 03/28/2019   CO2 20 (L) 03/28/2019   Lab Results  Component Value Date   TSH 0.885 03/23/2019    ASSESSMENT/PLAN    Problem List Items Addressed This Visit    Presence of bare metal stent in LAD coronary artery: With significant stenosis on either end of the stent (Chronic)    Remains really on Plavix more because of history of TIA than actual CAD.  Would be okay with just aspirin however given TIA history, will continue Plavix      Relevant Orders   EKG 12-Lead (Completed)   Dyslipidemia, goal LDL below 70 (Chronic)    Labs have been [name] well controlled.  I think at this point we can reduce atorvastatin to 40 mg daily--especially in light of his recent symptoms noted of forgetfulness.      Relevant Medications   atorvastatin (LIPITOR) 40 MG tablet   Other Relevant Orders   EKG 12-Lead (Completed)   Mild aortic insufficiency (Chronic)    Unless murmur gets worse or symptoms warrant, no  further echo.      Relevant Medications   atorvastatin (LIPITOR) 40 MG tablet   Sinus bradycardia (Chronic)    As long as heart rate stays stable, would not show concern without evidence of lightheadedness, dizziness or syncope/near syncope. Continue low-dose Lopressor, but we may need to end up switching from beta-blocker to a different medication for mild blood pressure.      Relevant Medications   atorvastatin (LIPITOR) 40 MG tablet   Two-vessel CAD status post CABG x2;  - Primary (Chronic)    Severe two-vessel disease of RCA and LAD status post CABG x2 back in 2012.  Has not had any real angina since then.  Has known occlusion of D1  Myoview in 2015 showed small partially worse with defect goes along with that occlusion.  In the absence of active anginal symptoms, no real requirement for follow-up studies given his age.  He is on stable maintenance medications: Low-dose beta-blocker, Plavix and atorvastatin. .. Plan: Reduce atorvastatin to 40 mg.  Okay to hold Plavix 5-7 days preop for surgeries or procedures.  Okay to hold Plavix for significant bleeding or bruising.  (3 to 5 days)       Relevant Medications   atorvastatin (LIPITOR) 40 MG tablet   Other Relevant Orders   EKG 12-Lead (Completed)       COVID-19 Education: The signs and symptoms of COVID-19 were discussed with the patient and how to seek care for testing (follow up with PCP or arrange E-visit).   The importance of social distancing and COVID-19 vaccination was discussed today.  I spent a total of 10minutes with the patient. >  50% of the time was spent in direct patient consultation.  Additional time spent with chart review  / charting (studies, outside notes, etc): 6 Total Time: 24 min   Current medicines are reviewed at length with the patient today.  (+/- concerns) none  Notice: This dictation was prepared with Dragon dictation along with smaller phrase technology. Any transcriptional errors that  result from this process are unintentional and  may not be corrected upon review.  Patient Instructions / Medication Changes & Studies & Tests Ordered   Patient Instructions  Medication Instructions:      DECREASE ATORVASTATIN TO 40 MG FROM 80 MG  ( COMPLETE BOTTLE)   NEW PRESCRIPTION SENT TO [PHARMACY FOR 40 MG ATORVASTATIN   NO OTHER  CHANGES  *If you need a refill on your cardiac medications before your next appointment, please call your pharmacy*   Lab Work: NOT NEEDED   Testing/Procedures: NOT NEEDED   Follow-Up: At [facility], you and your health needs are our priority.  As part of our continuing mission to provide you with exceptional heart care, we have created designated Provider Care Teams.  These Care Teams include your primary Cardiologist (physician) and Advanced Practice Providers (APPs -  Physician Assistants and Nurse Practitioners) who all work together to provide you with the care you need, when you need it.  We recommend signing up for the patient portal called "MyChart".  Sign up information is provided on this After Visit Summary.  MyChart is used to connect with patients for Virtual Visits (Telemedicine).  Patients are able to view lab/test results, encounter notes, upcoming appointments, etc.  Non-urgent messages can be sent to your provider as well.   To learn more about what you can do with MyChart, go to [email or web address].    Your next appointment:   6 month(s)  The format for your next appointment:   In Person  Provider:   Glenetta Hew, [name]       Studies Ordered:   Orders Placed This Encounter  Procedures  . EKG 12-Lead     Tanner [name], M.D., M.S. Interventional Cardiologist   Pager # [phone] Phone # [phone] [address] [place], [place] [address]   Thank you for choosing Heart[facility] at [facility]!!

## 2020-01-23 ENCOUNTER — Encounter: Payer: Self-pay | Admitting: Cardiology

## 2020-01-23 NOTE — Assessment & Plan Note (Addendum)
Severe two-vessel disease of RCA and LAD status post CABG x2 back in 2012.  Has not had any real angina since then.  Has known occlusion of D1  Myoview in 2015 showed small partially worse with defect goes along with that occlusion.  In the absence of active anginal symptoms, no real requirement for follow-up studies given his age.  He is on stable maintenance medications: Low-dose beta-blocker, Plavix and atorvastatin. .. Plan: Reduce atorvastatin to 40 mg.  Okay to hold Plavix 5-7 days preop for surgeries or procedures.  Okay to hold Plavix for significant bleeding or bruising.  (3 to 5 days)

## 2020-01-23 NOTE — Assessment & Plan Note (Signed)
As long as heart rate stays stable, would not show concern without evidence of lightheadedness, dizziness or syncope/near syncope. Continue low-dose Lopressor, but we may need to end up switching from beta-blocker to a different medication for mild blood pressure.

## 2020-01-23 NOTE — Assessment & Plan Note (Signed)
Unless murmur gets worse or symptoms warrant, no further echo.

## 2020-01-23 NOTE — Assessment & Plan Note (Addendum)
Labs have been pretty well controlled.  I think at this point we can reduce atorvastatin to 40 mg daily--especially in light of his recent symptoms noted of forgetfulness.

## 2020-01-23 NOTE — Assessment & Plan Note (Signed)
Remains really on Plavix more because of history of TIA than actual CAD.  Would be okay with just aspirin however given TIA history, will continue Plavix

## 2020-04-26 ENCOUNTER — Telehealth: Payer: Self-pay | Admitting: Cardiology

## 2020-04-26 ENCOUNTER — Other Ambulatory Visit: Payer: Self-pay

## 2020-04-26 MED ORDER — METOPROLOL TARTRATE 25 MG PO TABS: 12.5000 mg | ORAL_TABLET | Freq: Two times a day (BID) | ORAL | 6 refills | Status: DC

## 2020-04-26 NOTE — Telephone Encounter (Signed)
*  STAT* If patient is at the pharmacy, call can be transferred to refill team.   1. Which medications need to be refilled? (please list name of each medication and dose if known) metoprolol tartrate (LOPRESSOR) 25 MG tablet  2. Which pharmacy/location (including street and city if local pharmacy) is medication to be sent to?  [facility] [place] - [place].  3. Do they need a 30 day or 90 day supply? [place]

## 2020-05-19 ENCOUNTER — Telehealth: Payer: Self-pay | Admitting: Cardiology

## 2020-05-19 NOTE — Telephone Encounter (Signed)
*  STAT* If patient is at the pharmacy, call can be transferred to refill team.   1. Which medications need to be refilled? (please list name of each medication and dose if known) clopidogrel (PLAVIX) 75 MG tablet  2. Which pharmacy/location (including street and city if local pharmacy) is medication to be sent to? [facility] [place] - [place].  3. Do they need a 30 day or 90 day supply? [place]

## 2020-05-29 NOTE — Telephone Encounter (Signed)
*  STAT* If patient is at the pharmacy, call can be transferred to refill team.   1. Which medications need to be refilled? (please list name of each medication and dose if known) Clopidogrel- need this today please  2. Which pharmacy/location (including street and city if local pharmacy) is medication to be sent to? [facility] [phone]  3. Do they need a 30 day or 90 day supply?90 days and refills

## 2020-06-05 ENCOUNTER — Telehealth: Payer: Self-pay | Admitting: Cardiology

## 2020-06-05 MED ORDER — CLOPIDOGREL BISULFATE 75 MG PO TABS: 75.0000 mg | ORAL_TABLET | Freq: Every day | ORAL | 3 refills | Status: DC

## 2020-06-05 NOTE — Telephone Encounter (Signed)
*  STAT* If patient is at the pharmacy, call can be transferred to refill team.   1. Which medications need to be refilled? (please list name of each medication and dose if known) clopidogrel (PLAVIX) 75 MG tablet  2. Which pharmacy/location (including street and city if local pharmacy) is medication to be sent to? [facility] [place] - [place].  3. Do they need a 30 day or 90 day supply? 90  Patient states that he has called in 3x to get this medication refilled but it has not been done yet. Can someone please work on this for him?

## 2020-06-05 NOTE — Telephone Encounter (Signed)
Spoke with patient. Refill of Plavix sent to [facility] as requested. Patient verbalized understanding.

## 2020-07-18 ENCOUNTER — Ambulatory Visit: Payer: Medicare Other | Admitting: Cardiology

## 2020-09-12 ENCOUNTER — Encounter: Payer: Self-pay | Admitting: Cardiology

## 2020-09-12 ENCOUNTER — Ambulatory Visit (INDEPENDENT_AMBULATORY_CARE_PROVIDER_SITE_OTHER): Payer: Medicare Other | Admitting: Cardiology

## 2020-09-12 ENCOUNTER — Other Ambulatory Visit: Payer: Self-pay

## 2020-09-12 VITALS — BP 114/68 | HR 63 | Ht 70.5 in | Wt 154.0 lb

## 2020-09-12 DIAGNOSIS — G459 Transient cerebral ischemic attack, unspecified: Secondary | ICD-10-CM

## 2020-09-12 DIAGNOSIS — E785 Hyperlipidemia, unspecified: Secondary | ICD-10-CM

## 2020-09-12 DIAGNOSIS — I2510 Atherosclerotic heart disease of native coronary artery without angina pectoris: Secondary | ICD-10-CM

## 2020-09-12 DIAGNOSIS — I480 Paroxysmal atrial fibrillation: Secondary | ICD-10-CM | POA: Diagnosis not present

## 2020-09-12 MED ORDER — ROSUVASTATIN CALCIUM 40 MG PO TABS: 40.0000 mg | ORAL_TABLET | Freq: Every day | ORAL | 3 refills | Status: DC

## 2020-09-12 MED ORDER — METOPROLOL SUCCINATE ER 25 MG PO TB24: 12.5000 mg | ORAL_TABLET | Freq: Every day | ORAL | 3 refills | Status: DC

## 2020-09-12 NOTE — Progress Notes (Signed)
Primary Care Provider: Deland Pretty, MD Cardiologist: Glenetta Hew, MD Electrophysiologist: None  Clinic Note: Chief Complaint  Patient presents with  . Follow-up    No major issues, just not as much energy.  Not able to do as much.  . Coronary Artery Disease    No angina   ======================================== ASSESSMENT/PLAN   Problem List Items Addressed This Visit    Dyslipidemia, goal LDL below 70 (Chronic)    Labs have been pretty well controlled, but the most recent labs were not at all controlled with an LDL of 156.  He was doing okay on 80 mg atorvastatin,, but labs worsened with reduction to 40 mg daily.  With him having some fatigue and memory issues, I am reluctant of continuing high dose of atorvastatin.  Plan: convert from atorvastatin to rosuvastatin 40 mg.  .  He should be due for labs recheck with PCP soon       Relevant Medications   metoprolol succinate (TOPROL XL) 25 MG 24 hr tablet   rosuvastatin (CRESTOR) 40 MG tablet   AF (atrial fibrillation) -- Post Operative (Chronic)    No recurrence.  Monitor.  He is on Plavix for maintenance CAD.  He has been reluctant to be on full DOAC.  Without any further A. fib, we agreed to simply simply using Plavix.      Relevant Medications   metoprolol succinate (TOPROL XL) 25 MG 24 hr tablet   rosuvastatin (CRESTOR) 40 MG tablet   TIA (transient ischemic attack) (Chronic)    No further episodes. Remains on Plavix for maintenance.   Okay to hold Plavix for procedures.  (5 days)      Relevant Medications   metoprolol succinate (TOPROL XL) 25 MG 24 hr tablet   rosuvastatin (CRESTOR) 40 MG tablet   Two-vessel CAD status post CABG x2;  - Primary (Chronic)    He had pretty severe two-vessel disease back in 2012 with three-vessel CABG.  Most recent Myoview showed evidence of known occluded D1, no ischemia.  Plan:  In the absence of any ongoing symptoms, we decided given his advanced age, we will not do  further studies unless warranted by symptoms.  Convert to long-acting beta-blocker (Toprol-metoprolol succinate) from metoprolol tartrate which should be dosed twice daily but is only been taking once daily -> maintain dose 12.5 mg  Convert from atorvastatin to rosuvastatin  Continue Plavix, but can be held for procedures.      Relevant Medications   metoprolol succinate (TOPROL XL) 25 MG 24 hr tablet   rosuvastatin (CRESTOR) 40 MG tablet   Other Relevant Orders   EKG 12-Lead (Completed)     ========================================  HPI:    Tanner [name] is a 87 y.o. male with a PMH notable for severe two-vessel disease-CABG, TIA, HLD and HTN below who presents today for delayed 44-month follow-up.   1991-ACS-BMS PCI of the LAD  February 2012: Class IV angina -> two-vessel CAD (diffuse RCA and 80/90 percent ISR in LAD along with occluded D1) => CABG x2 (LIMA-LAD and SVG-RPDA)  Myoview June 2015: Small apical anterolateral defect consistent with known occlusion of diagonal fills via collaterals.  Medical management.  April 2015-TIA  [name] was last seen on Jan 20, 2020-noted mostly is having some short-term memory issues and indicating that he may be getting a little older.  Not quite able to do his much as he would like to do.  Not as much energy level.  Having to rest  a little more frequently.  Recent Hospitalizations: None  Reviewed  CV studies:    The following studies were reviewed today: (if available, images/films reviewed: From Epic Chart or Care Everywhere) . None: Per patient choice  Interval History:   Tanner [name] returns here today for routine follow-up stating that he pretty much does not have the energy he used to have.  He really only go about to have hours of working with his little odd jobs before you stop and rest.  He is no longer working without that he manages because of the energy level issue.  He also does not have the time and now because  he is the primary caregiver for his wife who is having worsening signs of dementia.  He still able to walk about 20 to 30 minutes at a time without any chest tightness or pressure.  Pretty much asymptomatic from cardiac standpoint.  CV Review of Symptoms (Summary): no chest pain or dyspnea on exertion positive for - A little bit more exercise intolerance negative for - edema, irregular heartbeat, orthopnea, palpitations, paroxysmal nocturnal dyspnea, rapid heart rate, shortness of breath or Lightheadedness or dizziness, syncope/near syncope or TIA/amaurosis fugax, claudication  The patient does not have symptoms concerning for COVID-19 infection (fever, chills, cough, or new shortness of breath).   REVIEWED OF SYSTEMS   ROS Joint pain, memory loss, reduced activity tolerance/more fatigue.  I have reviewed and (if needed) personally updated the patient's problem list, medications, allergies, past medical and surgical history, social and family history.   PAST MEDICAL HISTORY   Past Medical History:  Diagnosis Date  . Anticoagulant long-term use    plavix  (for hx TIA)  . Arthritis    right shoulder   . Benign essential tremor    hands -- occasional  . CAD S/P percutaneous coronary angioplasty cardiologist-- dr Keyleen Cerrato   a) PCI and BMS to LAD in 1991; b) CATH 10/2010: Severe 2 Vessel disease = diffuse RCA ( 75% prox, 90% mid & 70 + 75% distal) & LAD (80-90% pre-stent, 75% post-stent) with occluded D1 (filled via OM-D1 collaterals)  -- CABG x 2; c) Myoview 4/'15: INTERMEDIATE RISK - small sized reversible apical/anterolateral defect -- most likely c/w known occlusion of D1 with collaterals.(med Rx)  . History of basal cell carcinoma (BCC) excision   . History of bladder cancer urologist-- dr eskridge   dx 11/ 2015-- s/p TURBT  . History of kidney stones   . History of TIA (transient ischemic attack) 12/09/2013  . History of urethral stricture   . Hyperlipidemia with target LDL less  than 70    controlled.  . Neuropathy of lower extremity   . Nocturia   . Osteoporosis   . Peripheral neuropathy    lower extremity  . Postoperative atrial fibrillation ([place]) 10/2010   after bypass-"shocked back in and no problems since":   per cardiologist note, dr Nitisha Civello, no recurrence  . S/P CABG x 2 10/18/2010   LIMA-LAD, SVG to RPDA (Dr. [name])  . TGA (transient global amnesia)    "x3 for hours then goes away"  . 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    [name] Kitchen Wears glasses   . Wears partial dentures    upper    PAST SURGICAL HISTORY   Past Surgical History:  Procedure Laterality Date  . APPENDECTOMY  as teen  . BASAL CELL CARCINOMA EXCISION  07/2014  . CARDIAC CATHETERIZATION  10/17/2010   80%prox w/mild in-stent restenosis w/75% mid-LADstenosis aft the stent,multi severe stenoses RCAw/heavily calcified vessel,norm LV  . CARDIOVERSION  2012   after bypass surgery  . CATARACT EXTRACTION W/ INTRAOCULAR LENS  IMPLANT, BILATERAL  2009  approx.  . COCCYX REMOVAL    . CORONARY ANGIOPLASTY WITH STENT PLACEMENT  1991   PCI and BMS to LAD  . CORONARY ARTERY BYPASS GRAFT  10-18-2010  dr Ricard Dillon @[facility]    LIMA-LAD, SVG-rPDA  . CYSTOSCOPY W/ RETROGRADES N/A 07/12/2014   Procedure: CYSTOSCOPY WITH RETROGRADE PYELOGRAM;  Surgeon: [name], MD;  Location: [facility];  Service: Urology;  Laterality: N/A;  . CYSTOSCOPY WITH BIOPSY N/A 09/09/2014   Procedure: CYSTO WITH BIOPSY AND FULGERATION, DILATION OF URETHRAL STRICTURE;  Surgeon: [name], MD;  Location: [facility];  Service: Urology;  Laterality: N/A;  BLADDER BIOPSY  . CYSTOSCOPY WITH BIOPSY N/A 01/03/2015   Procedure: CYSTOSCOPY WITH BIOPSY;  Surgeon: [name], MD;  Location: [facility];  Service: Urology;  Laterality: N/A;  . CYSTOSCOPY WITH BIOPSY  N/A 01/26/2019   Procedure: CYSTOSCOPY WITH BIOPSY, BILATERAL RETROGRADE PYELOGRAM;  Surgeon: [name], MD;  Location[facility];  Service: Urology;  Laterality: N/A;  . CYSTOSCOPY WITH FULGERATION N/A 01/26/2019   Procedure: CYSTOSCOPY WITH FULGERATION;  Surgeon: [name], MD;  Location[facility];  Service: Urology;  Laterality: N/A;  . CYSTOSCOPY WITH URETHRAL DILATATION N/A 01/03/2015   Procedure: CYSTOSCOPY WITH URETHRAL DILATATION;  Surgeon: [name], MD;  Location: [facility];  Service: Urology;  Laterality: N/A;  . DOPPLER ECHOCARDIOGRAPHY  10/18/2004   EF 55-65%,BORDERLINE -enlarged aortic root,mild aortic insuff.,trace tricus.insuff.  [name] Kitchen FINGER SURGERY Left 03-17-2001   dr Burney Gauze  @[facility]    repair complex laceration injury 5th digit  . [place]  . NM MYOVIEW LTD  12/16/2013   INTERMEDIATE RISK test with small sized reversible perfusion defect in the apical anterolateral wall -- most likely consistent with known occlusion of D1 with collaterals.  . shoulder surgery Right 1988  . TONSILLECTOMY  as teen  . TRANSTHORACIC ECHOCARDIOGRAM  April 2015   EF 35-40%, mild AI, moderately dilated ascending aorta  . TRANSURETHRAL RESECTION OF PROSTATE N/A 07/12/2014   Procedure: TRANSURETHRAL RESECTION OF THE PROSTATE (TURP) WITH MITOMYCIN -C;  Surgeon: [name], MD;  Location: [facility];  Service: Urology;  Laterality: N/A;    Immunization History  Administered Date(s) Administered  . Influenza-Unspecified 06/02/2014    MEDICATIONS/ALLERGIES   Current Meds  Medication Sig  . clopidogrel (PLAVIX) 75 MG tablet Take 1 tablet (75 mg total) by mouth daily.  . fish oil-omega-3 fatty acids 1000 MG capsule Take 1 g by mouth daily.   . metoprolol succinate (TOPROL XL) 25 MG 24 hr tablet Take 0.5 tablets (12.5 mg total) by mouth daily.  . metoprolol tartrate (LOPRESSOR) 25 MG tablet Take 0.5 tablets (12.5 mg total) by mouth 2  (two) times daily.  . Multiple Vitamin (MULTIVITAMIN) capsule Take 1 capsule by mouth at bedtime.   . rosuvastatin (CRESTOR) 40 MG tablet Take 1 tablet (40 mg total) by mouth daily.  . [DISCONTINUED] atorvastatin (LIPITOR) 40 MG tablet Take 1 tablet (40 mg total) by mouth daily.    No Known Allergies  SOCIAL HISTORY/FAMILY HISTORY   Reviewed in Epic:  Pertinent findings:  Social History   Tobacco Use  .  Smoking status: Former Smoker    Packs/day: 1.00    Years: 5.00    Pack years: 5.00    Types: Cigarettes    Quit date: 09/02/1957    Years since quitting: 63.0  . Smokeless tobacco: Never Used  Vaping Use  . Vaping Use: Never used  Substance Use Topics  . Alcohol use: Never    Alcohol/week: 0.0 standard drinks  . Drug use: Never   Social History   Social History Narrative   Pt lives at home with spouse.   Caffeine Use: very little      He stopped doing the [employer] building about 6 months ago, indicating that he just has not had the same out of energy.  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  He says that now he can only do about 2 and half hours worth of work before you stop and rest.    OBJCTIVE -PE, EKG, labs   Wt Readings from Last 3 Encounters:  09/12/20 154 lb (69.9 kg)  01/20/20 155 lb (70.3 kg)  07/12/19 155 lb 12.8 oz (70.7 kg)    Physical Exam: BP 114/68   Pulse 63   Ht 5' 10.5" (1.791 m)   Wt 154 lb (69.9 kg)   SpO2 98%   BMI 21.78 kg/m  Physical Exam Vitals reviewed.  Constitutional:      General: He is not in acute distress.    Appearance: Normal appearance. He is not ill-appearing or toxic-appearing.     Comments: Somewhat thin and frail appearing elderly gentleman.  Well-groomed.  Starting to see more likely stated age.  HENT:     Head: Normocephalic and atraumatic.  Neck:     Vascular: No carotid bruit, hepatojugular reflux or JVD.  Cardiovascular:     Rate and Rhythm: Normal rate and regular  rhythm.  No extrasystoles are present.    Pulses: Normal pulses and intact distal pulses.     Heart sounds: Murmur heard.  High-pitched harsh crescendo-decrescendo early systolic murmur is present with a grade of 1/6 at the upper right sternal border radiating to the neck. No friction rub. No S4 sounds.   Pulmonary:     Effort: Pulmonary effort is normal. No respiratory distress.     Breath sounds: Normal breath sounds. No stridor.  Musculoskeletal:        General: No swelling. Normal range of motion.     Cervical back: Normal range of motion and neck supple.     Right lower leg: Edema (Stable foot drop) present.     Comments: Stable foot drop  Skin:    General: Skin is warm and dry.  Neurological:     General: No focal deficit present.     Mental Status: He is alert and oriented to person, place, and time.     Cranial Nerves: No cranial nerve deficit.     Gait: Gait (Slow and deliberate, but nonantalgic) normal.     Deep Tendon Reflexes: Reflexes normal.     Comments: Just a little slower than usual.  Psychiatric:        Mood and Affect: Mood normal.        Behavior: Behavior normal.        Thought Content: Thought content normal.        Judgment: Judgment normal.     Adult ECG Report  Rate: 63;  Rhythm: normal sinus rhythm and Nonspecific ST and T wave changes.  Otherwise  normal axis, respirations.;   Narrative Interpretation: Normal/stable  Recent Labs: 06/29/2020  Na+ 141, K+ 4.7, Cl- 104, HCO3-24, BUN 28, Cr 1.01, Glu 100, Ca2+ 9.6; AST 38, ALT 28, AlkP 39  CBC: W 4.5, H/H 13/39.8, Plt 141  TC 226, TG 140, HDL 45, LDL 156; A1c 7.3; TSH 2.94.  ========================================  COVID-19 Education: The signs and symptoms of COVID-19 were discussed with the patient and how to seek care for testing (follow up with PCP or arrange E-visit).   The importance of social distancing and COVID-19 vaccination was discussed today.  The patient is practicing social  distancing & Masking.   I spent a total of 83minutes with the patient spent in direct patient consultation.  Additional time spent with chart review  / charting (studies, outside notes, etc): 10 min Total Time: 28 min   Current medicines are reviewed at length with the patient today.  (+/- concerns) he has been taking his metoprolol to tartrate only once daily.  This visit occurred during the SARS-CoV-2 public health emergency.  Safety protocols were in place, including screening questions prior to the visit, additional usage of staff PPE, and extensive cleaning of exam room while observing appropriate contact time as indicated for disinfecting solutions.  Notice: This dictation was prepared with Dragon dictation along with smaller phrase technology. Any transcriptional errors that result from this process are unintentional and may not be corrected upon review.  Patient Instructions / Medication Changes & Studies & Tests Ordered   Patient Instructions  Medication Instructions:   STOP ATORVASTATIN  START ROSUVASTATIN 40 MG ONCE DAILY  STOP METOPROLOL TARTRATE  START METOPROLOL SUCC 12.5 MG ONCE DAILY = 1/2 OF THE 25 MG TABLET ONCE DAILY  *If you need a refill on your cardiac medications before your next appointment, please call your pharmacy*   Follow-Up: At [facility], you and your health needs are our priority.  As part of our continuing mission to provide you with exceptional heart care, we have created designated Provider Care Teams.  These Care Teams include your primary Cardiologist (physician) and Advanced Practice Providers (APPs -  Physician Assistants and Nurse Practitioners) who all work together to provide you with the care you need, when you need it.  We recommend signing up for the patient portal called "MyChart".  Sign up information is provided on this After Visit Summary.  MyChart is used to connect with patients for Virtual Visits (Telemedicine).  Patients are  able to view lab/test results, encounter notes, upcoming appointments, etc.  Non-urgent messages can be sent to your provider as well.   To learn more about what you can do with MyChart, go to [email or web address].    Your next appointment:   6 month(s)  The format for your next appointment:   In Person  Provider:   Glenetta Hew, MD     Studies Ordered:   Orders Placed This Encounter  Procedures  . EKG 12-Lead     Glenetta Hew, M.D., M.S. Interventional Cardiologist   Pager # [phone] Phone # [phone] [address] [place], [place] [address]   Thank you for choosing Heartcare at [facility]!!

## 2020-09-12 NOTE — Patient Instructions (Signed)
Medication Instructions:   STOP ATORVASTATIN  START ROSUVASTATIN 40 MG ONCE DAILY  STOP METOPROLOL TARTRATE  START METOPROLOL SUCC 12.5 MG ONCE DAILY = 1/2 OF THE 25 MG TABLET ONCE DAILY  *If you need a refill on your cardiac medications before your next appointment, please call your pharmacy*   Follow-Up: At [facility], you and your health needs are our priority.  As part of our continuing mission to provide you with exceptional heart care, we have created designated Provider Care Teams.  These Care Teams include your primary Cardiologist (physician) and Advanced Practice Providers (APPs -  Physician Assistants and Nurse Practitioners) who all work together to provide you with the care you need, when you need it.  We recommend signing up for the patient portal called "MyChart".  Sign up information is provided on this After Visit Summary.  MyChart is used to connect with patients for Virtual Visits (Telemedicine).  Patients are able to view lab/test results, encounter notes, upcoming appointments, etc.  Non-urgent messages can be sent to your provider as well.   To learn more about what you can do with MyChart, go to [email or web address].    Your next appointment:   6 month(s)  The format for your next appointment:   In Person  Provider:   Glenetta Hew, MD

## 2020-09-21 ENCOUNTER — Encounter: Payer: Self-pay | Admitting: Cardiology

## 2020-09-21 NOTE — Assessment & Plan Note (Signed)
No recurrence.  Monitor.  He is on Plavix for maintenance CAD.  He has been reluctant to be on full DOAC.  Without any further A. fib, we agreed to simply simply using Plavix.

## 2020-09-21 NOTE — Assessment & Plan Note (Signed)
No further episodes. Remains on Plavix for maintenance.   Okay to hold Plavix for procedures.  (5 days)

## 2020-09-21 NOTE — Assessment & Plan Note (Signed)
He had pretty severe two-vessel disease back in 2012 with three-vessel CABG.  Most recent Myoview showed evidence of known occluded D1, no ischemia.  Plan:  In the absence of any ongoing symptoms, we decided given his advanced age, we will not do further studies unless warranted by symptoms.  Convert to long-acting beta-blocker (Toprol-metoprolol succinate) from metoprolol tartrate which should be dosed twice daily but is only been taking once daily -> maintain dose 12.5 mg  Convert from atorvastatin to rosuvastatin  Continue Plavix, but can be held for procedures.

## 2020-09-21 NOTE — Assessment & Plan Note (Addendum)
Labs have been pretty well controlled, but the most recent labs were not at all controlled with an LDL of 156.  He was doing okay on 80 mg atorvastatin,, but labs worsened with reduction to 40 mg daily.  With him having some fatigue and memory issues, I am reluctant of continuing high dose of atorvastatin.  Plan: convert from atorvastatin to rosuvastatin 40 mg.  .  He should be due for labs recheck with PCP soon

## 2021-05-14 ENCOUNTER — Other Ambulatory Visit: Payer: Self-pay

## 2021-05-14 ENCOUNTER — Encounter: Payer: Self-pay | Admitting: Cardiology

## 2021-05-14 ENCOUNTER — Ambulatory Visit (INDEPENDENT_AMBULATORY_CARE_PROVIDER_SITE_OTHER): Payer: Medicare Other | Admitting: Cardiology

## 2021-05-14 VITALS — BP 136/66 | HR 70 | Ht 70.5 in | Wt 150.0 lb

## 2021-05-14 DIAGNOSIS — G459 Transient cerebral ischemic attack, unspecified: Secondary | ICD-10-CM

## 2021-05-14 DIAGNOSIS — R001 Bradycardia, unspecified: Secondary | ICD-10-CM | POA: Diagnosis not present

## 2021-05-14 DIAGNOSIS — E785 Hyperlipidemia, unspecified: Secondary | ICD-10-CM

## 2021-05-14 DIAGNOSIS — I2510 Atherosclerotic heart disease of native coronary artery without angina pectoris: Secondary | ICD-10-CM

## 2021-05-14 DIAGNOSIS — Z955 Presence of coronary angioplasty implant and graft: Secondary | ICD-10-CM

## 2021-05-14 NOTE — Patient Instructions (Signed)
Medication Instructions:  No changes *If you need a refill on your cardiac medications before your next appointment, please call your pharmacy*   Lab Work: None ordered If you have labs (blood work) drawn today and your tests are completely normal, you will receive your results only by: [place] (if you have MyChart) OR A paper copy in the mail If you have any lab test that is abnormal or we need to change your treatment, we will call you to review the results.   Testing/Procedures: None ordered   Follow-Up: At [facility], you and your health needs are our priority.  As part of our continuing mission to provide you with exceptional heart care, we have created designated Provider Care Teams.  These Care Teams include your primary Cardiologist (physician) and Advanced Practice Providers (APPs -  Physician Assistants and Nurse Practitioners) who all work together to provide you with the care you need, when you need it.  We recommend signing up for the patient portal called "MyChart".  Sign up information is provided on this After Visit Summary.  MyChart is used to connect with patients for Virtual Visits (Telemedicine).  Patients are able to view lab/test results, encounter notes, upcoming appointments, etc.  Non-urgent messages can be sent to your provider as well.   To learn more about what you can do with MyChart, go to [email or web address].    Your next appointment:   6 month(s)  The format for your next appointment:   In Person  Provider:   You may see Glenetta Hew, MD or one of the following Advanced Practice Providers on your designated Care Team:   Rosaria Ferries, PA-C Caron Presume, PA-C Jory Sims, DNP, ANP

## 2021-05-14 NOTE — Progress Notes (Signed)
Primary Care Provider: Deland Pretty, MD Cardiologist: Glenetta Hew, MD Electrophysiologist: None  Clinic Note: Chief Complaint  Patient presents with   Follow-up    8 months. ->  Confused about medications.  Med list did not match   Coronary Artery Disease    No angina, just slowing down some.  Not as active and is strong as he used to be.   ===================================  ASSESSMENT/PLAN   Problem List Items Addressed This Visit       Cardiology Problems   Dyslipidemia, goal LDL below 70 (Chronic)    I am little bit confused looking at his 2 medication list.  PCP's office still has him on 80 mg atorvastatin we had switched him to rosuvastatin last visit.  To clarify, I have deleted the prescription for atorvastatin and renewed the prescription for rosuvastatin. Labs are being checked by PCP.      Relevant Medications   ezetimibe (ZETIA) 10 MG tablet   rosuvastatin (CRESTOR) 40 MG tablet   TIA (transient ischemic attack) (Chronic)    No further episodes.  Is on Plavix for CAD and stroke prevention.  Okay to hold Plavix 5 days preop for procedures or surgeries.  Also okay to hold for significant bleeding.      Relevant Medications   ezetimibe (ZETIA) 10 MG tablet   rosuvastatin (CRESTOR) 40 MG tablet   Sinus bradycardia (Chronic)    Having some fatigue, and previously having low heart rates.  Blood pressure and heart rate looks better today.  During his last visit, I switched him from Lopressor 12.5 mg twice daily to Toprol (metoprolol succinate) 12.5 mg daily which is one half a 25 mg tablet.  In doing so, I reduced his dose of beta-blocker.  Unfortunately this is confused because both were listed on his med list. I have therefore discontinued the Lopressor prescription and renewed the Toprol prescription.      Relevant Medications   ezetimibe (ZETIA) 10 MG tablet   rosuvastatin (CRESTOR) 40 MG tablet   Two-vessel CAD status post CABG x2;  - Primary  (Chronic)    Doing well.  No recurrent anginal symptoms.  Most recent Myoview years ago, was nonischemic.  He is not having any symptoms, we decided long ago that we would only check screening tests if he were to start having symptoms.  At his advanced age probably not interested in invasive procedures at this time.  Plan: Continue with rosuvastatin and Zetia, Plavix alone without aspirin and was very low-dose Toprol.      Relevant Medications   ezetimibe (ZETIA) 10 MG tablet   rosuvastatin (CRESTOR) 40 MG tablet   Other Relevant Orders   EKG 12-Lead (Completed)     Other   Presence of bare metal stent in LAD coronary artery: With significant stenosis on either end of the stent (Chronic)    He is actually on Plavix for both his TIA/stroke along with CAD.  Okay to hold Plavix for procedures or surgeries.  Would also be okay to consider switching to aspirin if there is concerns about bleeding.  However Plavix is less likely to have GI bleeding risks than aspirin       ===================================  HPI:    Tanner [name] is a 87 y.o. male with a PMH below who presents today for delayed 55-monthfollow-up.  PMH notable for severe two-vessel disease-CABG, TIA, HLD and HTN below who presents today for delayed 670-monthollow-up.   1991-ACS-BMS PCI of the LAD February  2012: Class IV angina -> two-vessel CAD (diffuse RCA and 80/90 percent ISR in LAD along with occluded D1) => CABG x2 (LIMA-LAD and SVG-RPDA) Myoview June 2015: Small apical anterolateral defect consistent with known occlusion of diagonal fills via collaterals.  Medical management. April 2015-TIA  [place] was last seen on 09/12/2020 -> just noted that he did not have the same energy he used to.  Not able to do as many hours of jobs as he used to.  Has really given up a lot of the chores that he used to do.  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  He was still doing about 20 to 30 minutes of walking during the day without any chest pain or pressure.  No dyspnea.  Just a little exercise intolerance.  Recent Hospitalizations: None  Reviewed  CV studies:    The following studies were reviewed today: (if available, images/films reviewed: From Epic Chart or Care Everywhere) None:  Interval History:   [name] returns for routine follow-up today saying that he is not as strong and energetic as he had been.  He is not doing the 20 to 30-minute walking that he used to do.  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  Still does some yard work, and some of the mild fix-it jobs.  He is no longer doing the trips for the [employer].  He is still driving, but not very much.  He no longer drives on the trips,-gave up driving the [place].  He drives to the grocery store, to church and to doctors visits.  He seemed a little bit more confused than he had been.  Very hard of hearing.  He is little unsure about his medications.  He apparently was not sure about if his refills were there.  He is now only bothered by much cardiology issues, but has more prominent peripheral neuropathy.  Apparently he was started on gabapentin which helped some.  CV Review of Symptoms (Summary) Cardiovascular ROS: positive for - dyspnea on exertion and more notable exercise tolerance not necessarily exertional dyspnea check, just not in as energetic as he had been.  Mild end of day edema. negative for - chest pain, irregular heartbeat, orthopnea, palpitations, paroxysmal nocturnal dyspnea, rapid heart rate, shortness of breath, or lightheadedness, dizziness or wooziness, syncope s/near syncope, TIA/amaurosis fugax  REVIEWED OF SYSTEMS   Review of Systems  Constitutional:  Positive for weight loss (A little bit.  Just not eating as much as he used to, but mostly not as active.).  Negative for chills and fever.  HENT:  Positive for hearing loss (I think his hearing aid is not working today, very). Negative for congestion and nosebleeds.   Respiratory:  Negative for cough and shortness of breath.   Cardiovascular:        Per HPI  Gastrointestinal:  Negative for blood in stool and melena.  Genitourinary:  Positive for frequency (Nocturia). Negative for hematuria.  Musculoskeletal:  Positive for joint pain (Mild stiffening joints.  Not as mobile).  Neurological:  Negative for dizziness.  Psychiatric/Behavioral:  Positive for memory loss. Negative for depression. The patient is not nervous/anxious and does not have insomnia (Just frequent nocturia).        Lot of stress on him being caregiver for  his wife.  She is end-stage dementia.   I have reviewed and (if needed) personally updated the patient's problem list, medications, allergies, past medical and surgical history, social and family history.   PAST MEDICAL HISTORY   Past Medical History:  Diagnosis Date   Anticoagulant long-term use    plavix  (for hx TIA)   Arthritis    right shoulder    Benign essential tremor    hands -- occasional   CAD S/P percutaneous coronary angioplasty cardiologist-- dr Makenzey Nanni   a) PCI and BMS to LAD in 1991; b) CATH 10/2010: Severe 2 Vessel disease = diffuse RCA ( 75% prox, 90% mid & 70 + 75% distal) & LAD (80-90% pre-stent, 75% post-stent) with occluded D1 (filled via OM-D1 collaterals)  -- CABG x 2; c) Myoview 4/'15: INTERMEDIATE RISK - small sized reversible apical/anterolateral defect -- most likely c/w known occlusion of D1 with collaterals.(med Rx)   History of basal cell carcinoma (BCC) excision    History of bladder cancer urologist-- dr eskridge   dx 11/ 2015-- s/p TURBT   History of kidney stones    History of TIA (transient ischemic attack) 12/09/2013   History of urethral stricture    Hyperlipidemia with target LDL less than 70    controlled.   Neuropathy of lower  extremity    Nocturia    Osteoporosis    Peripheral neuropathy    lower extremity   Postoperative atrial fibrillation ([place]) 10/2010   after bypass-"shocked back in and no problems since":   per cardiologist note, dr Ellyn Hack, no recurrence   S/P CABG x 2 10/18/2010   LIMA-LAD, SVG to RPDA (Dr. [name])   [place] (transient global amnesia)    "x3 for hours then goes away"   Transient Dilated idiopathic cardiomyopathy April 2015; 04/2014   a) in setting of TIA: EF 35-40% (reduced from 55-65 percent) mild AI, moderate and dilated aorta.;; b) Recheck Echo 04/2014: EF 55-60%, Gr 1 DD, mild-mod AI.     Wears glasses    Wears partial dentures    upper    PAST SURGICAL HISTORY   Past Surgical History:  Procedure Laterality Date   APPENDECTOMY  as teen   BASAL CELL CARCINOMA EXCISION  07/2014   CARDIAC CATHETERIZATION  10/17/2010   80%prox w/mild in-stent restenosis w/75% mid-LADstenosis aft the stent,multi severe stenoses RCAw/heavily calcified vessel,norm LV   CARDIOVERSION  2012   after bypass surgery   CATARACT EXTRACTION W/ INTRAOCULAR LENS  IMPLANT, BILATERAL  2009  approx.   COCCYX REMOVAL     CORONARY ANGIOPLASTY WITH STENT PLACEMENT  1991   PCI and BMS to LAD   CORONARY ARTERY BYPASS GRAFT  10-18-2010  dr Ricard Dillon '@[facility]    LIMA-LAD, SVG-rPDA   CYSTOSCOPY W/ RETROGRADES N/A 07/12/2014   Procedure: CYSTOSCOPY WITH RETROGRADE PYELOGRAM;  Surgeon: [name], MD;  Location: [facility];  Service: Urology;  Laterality: N/A;   CYSTOSCOPY WITH BIOPSY N/A 09/09/2014   Procedure: CYSTO WITH BIOPSY AND FULGERATION, DILATION OF URETHRAL STRICTURE;  Surgeon: [name], MD;  Location: [facility];  Service: Urology;  Laterality: N/A;  BLADDER BIOPSY   CYSTOSCOPY WITH BIOPSY N/A 01/03/2015   Procedure: CYSTOSCOPY WITH BIOPSY;  Surgeon: [name], MD;  Location: [facility];  Service: Urology;  Laterality: N/A;   CYSTOSCOPY WITH BIOPSY N/A 01/26/2019   Procedure: CYSTOSCOPY WITH BIOPSY, BILATERAL  RETROGRADE PYELOGRAM;  Surgeon: [name], MD;  Location: [facility];  Service: Urology;  Laterality: N/A;  CYSTOSCOPY WITH FULGERATION N/A 01/26/2019   Procedure: CYSTOSCOPY WITH FULGERATION;  Surgeon: [name], MD;  Location: [facility];  Service: Urology;  Laterality: N/A;   CYSTOSCOPY WITH URETHRAL DILATATION N/A 01/03/2015   Procedure: CYSTOSCOPY WITH URETHRAL DILATATION;  Surgeon: [name], MD;  Location: [facility];  Service: Urology;  Laterality: N/A;   DOPPLER ECHOCARDIOGRAPHY  10/18/2004   EF 55-65%,BORDERLINE -enlarged aortic root,mild aortic insuff.,trace tricus.insuff.   FINGER SURGERY Left 03-17-2001   dr Burney Gauze  '@[facility]'$    repair complex laceration injury 5th digit   KIDNEY STONE SURGERY  1989   NM MYOVIEW LTD  12/16/2013   INTERMEDIATE RISK test with small sized reversible perfusion defect in the apical anterolateral wall -- most likely consistent with known occlusion of D1 with collaterals.   shoulder surgery Right 1988   TONSILLECTOMY  as teen   TRANSTHORACIC ECHOCARDIOGRAM  April 2015   EF 35-40%, mild AI, moderately dilated ascending aorta   TRANSURETHRAL RESECTION OF PROSTATE N/A 07/12/2014   Procedure: TRANSURETHRAL RESECTION OF THE PROSTATE (TURP) WITH MITOMYCIN -C;  Surgeon: [name], MD;  Location: [facility];  Service: Urology;  Laterality: N/A;    Immunization History  Administered Date(s) Administered   Influenza-Unspecified 06/02/2014    MEDICATIONS/ALLERGIES   --> Very confused.  His list from PCP is very different than the list we have.   -> During the previous visit, I converted him from Lopressor 12.5 mg twice daily to Toprol 12.5 mg daily (which is a reduced dose); I also converted him from atorvastatin to rosuvastatin-PCPs note still has atorvastatin 80 mg. PCPs note also indicates that he is on Jardiance 10 mg daily?   Current Meds  Medication Sig   clopidogrel (PLAVIX) 75 MG tablet Take 1 tablet  (75 mg total) by mouth daily.   ezetimibe (ZETIA) 10 MG tablet Take 10 mg by mouth daily.   fish oil-omega-3 fatty acids 1000 MG capsule Take 1 g by mouth daily.    metoprolol succinate (TOPROL XL) 25 MG 24 hr tablet Take 0.5 tablets (12.5 mg total) by mouth daily.   metoprolol tartrate (LOPRESSOR) 25 MG tablet Take 0.5 tablets (12.5 mg total) by mouth 2 (two) times daily.   Multiple Vitamin (MULTIVITAMIN) capsule Take 1 capsule by mouth at bedtime.    rosuvastatin (CRESTOR) 40 MG tablet Take 1 tablet (40 mg total) by mouth daily.    No Known Allergies  SOCIAL HISTORY/FAMILY HISTORY   Reviewed in Epic:  Pertinent findings:  Social History   Tobacco Use   Smoking status: Former    Packs/day: 1.00    Years: 5.00    Pack years: 5.00    Types: Cigarettes    Quit date: 09/02/1957    Years since quitting: 63.7   Smokeless tobacco: Never  Vaping Use   Vaping Use: Never used  Substance Use Topics   Alcohol use: Never    Alcohol/week: 0.0 standard drinks   Drug use: Never   Social History   Social History Narrative   Pt lives at home with spouse.   Caffeine Use: very little      He stopped doing the [employer] building about 6 months ago, indicating that he just has not had the same out of energy.  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  He says that now he can only do about 2 and half hours worth of work before you stop and rest.    OBJCTIVE -PE,  EKG, labs   Wt Readings from Last 3 Encounters:  05/14/21 150 lb (68 kg)  09/12/20 154 lb (69.9 kg)  01/20/20 155 lb (70.3 kg)    Physical Exam: BP 136/66 (BP Location: Left Arm, Patient Position: Sitting, Cuff Size: Normal)   Pulse 70   Ht 5' 10.5" (1.791 m)   Wt 150 lb (68 kg)   BMI 21.22 kg/m  Physical Exam Vitals reviewed.  Constitutional:      General: He is not in acute distress.    Appearance: He is not ill-appearing (Thin, frail elderly gentleman.) or toxic-appearing.      Comments: Well-groomed.  Definitely appears to be his stated age.  HENT:     Head: Normocephalic and atraumatic.  Neck:     Vascular: No carotid bruit or JVD.  Cardiovascular:     Rate and Rhythm: Normal rate. Rhythm irregular. Occasional Extrasystoles are present.    Chest Wall: PMI is not displaced.     Pulses: Intact distal pulses.     Heart sounds: S1 normal and S2 normal. Murmur (1/6 SEM at RUSB -> neck) heard.    No friction rub. No gallop.  Pulmonary:     Effort: Pulmonary effort is normal. No respiratory distress.     Breath sounds: Normal breath sounds. No wheezing, rhonchi or rales.  Musculoskeletal:        General: Deformity (Foot drop) present. No swelling.     Cervical back: Normal range of motion and neck supple.  Skin:    General: Skin is dry.  Neurological:     General: No focal deficit present.     Mental Status: He is alert and oriented to person, place, and time.     Gait: Gait abnormal (A little bit less smooth and steady. ->  Stable foot drop).  Psychiatric:        Behavior: Behavior normal.     Comments: Seems to be little more subdued.  A little more confused today.  Not as comfortable and fluent with his speech about his health and medications.    Adult ECG Report  Rate: 70;  Rhythm: normal sinus rhythm, sinus arrhythmia, and otherwise normal axis, intervals and durations. ;   Narrative Interpretation: Stable  Recent Labs: Due for labs recheck next month by PCP. 06/29/2020: TC 226, TG 140, HDL 45, LDL 156, CR 1.01.  ==================================================  COVID-19 Education: The signs and symptoms of COVID-19 were discussed with the patient and how to seek care for testing (follow up with PCP or arrange E-visit).    I spent a total of 26 minutes with the patient spent in direct patient consultation.  -> He is very hard of hearing, sometimes have to say things at least 3 times for him to understand.  He had lots of questions and was very  confused about his medications today.  Longer present than usual for him. Additional time spent with chart review  / charting (studies, outside notes, etc): 16 min Total Time: 42 min  Current medicines are reviewed at length with the patient today.  (+/- concerns) very confusing med list.  This visit occurred during the SARS-CoV-2 public health emergency.  Safety protocols were in place, including screening questions prior to the visit, additional usage of staff PPE, and extensive cleaning of exam room while observing appropriate contact time as indicated for disinfecting solutions.  Notice: This dictation was prepared with Dragon dictation along with smaller phrase technology. Any transcriptional errors that result from this process are  unintentional and may not be corrected upon review.  Patient Instructions / Medication Changes & Studies & Tests Ordered   Patient Instructions  Medication Instructions:  No changes *If you need a refill on your cardiac medications before your next appointment, please call your pharmacy*   Lab Work: None ordered If you have labs (blood work) drawn today and your tests are completely normal, you will receive your results only by: MyChart Message (if you have MyChart) OR A paper copy in the mail If you have any lab test that is abnormal or we need to change your treatment, we will call you to review the results.   Testing/Procedures: None ordered   Follow-Up: At [facility], you and your health needs are our priority.  As part of our continuing mission to provide you with exceptional heart care, we have created designated Provider Care Teams.  These Care Teams include your primary Cardiologist (physician) and Advanced Practice Providers (APPs -  Physician Assistants and Nurse Practitioners) who all work together to provide you with the care you need, when you need it.  We recommend signing up for the patient portal called "MyChart".  Sign up  information is provided on this After Visit Summary.  MyChart is used to connect with patients for Virtual Visits (Telemedicine).  Patients are able to view lab/test results, encounter notes, upcoming appointments, etc.  Non-urgent messages can be sent to your provider as well.   To learn more about what you can do with MyChart, go to [email or web address].    Your next appointment:   6 month(s)  The format for your next appointment:   In Person  Provider:   You may see Glenetta Hew, MD or one of the following Advanced Practice Providers on your designated Care Team:   Rosaria Ferries, PA-C Caron Presume, PA-C Jory Sims, DNP, ANP    Studies Ordered:   Orders Placed This Encounter  Procedures   EKG 12-Lead      Glenetta Hew, M.D., M.S. Interventional Cardiologist   Pager # [phone] Phone # [phone] [address] [place], [place] [address]   Thank you for choosing [facility] at [facility]!!

## 2021-05-28 ENCOUNTER — Encounter: Payer: Self-pay | Admitting: Cardiology

## 2021-05-28 MED ORDER — ROSUVASTATIN CALCIUM 40 MG PO TABS: 40.0000 mg | ORAL_TABLET | Freq: Every day | ORAL | 3 refills | Status: DC

## 2021-05-28 NOTE — Assessment & Plan Note (Signed)
I am little bit confused looking at his 2 medication list.  PCP's office still has him on 80 mg atorvastatin we had switched him to rosuvastatin last visit.  To clarify, I have deleted the prescription for atorvastatin and renewed the prescription for rosuvastatin. Labs are being checked by PCP.

## 2021-05-28 NOTE — Assessment & Plan Note (Signed)
He is actually on Plavix for both his TIA/stroke along with CAD.  Okay to hold Plavix for procedures or surgeries.  Would also be okay to consider switching to aspirin if there is concerns about bleeding.  However Plavix is less likely to have GI bleeding risks than aspirin

## 2021-05-28 NOTE — Assessment & Plan Note (Signed)
No further episodes.  Is on Plavix for CAD and stroke prevention.   Okay to hold Plavix 5 days preop for procedures or surgeries.  Also okay to hold for significant bleeding.

## 2021-05-28 NOTE — Assessment & Plan Note (Signed)
Having some fatigue, and previously having low heart rates.  Blood pressure and heart rate looks better today.  During his last visit, I switched him from Lopressor 12.5 mg twice daily to Toprol (metoprolol succinate) 12.5 mg daily which is one half a 25 mg tablet.  In doing so, I reduced his dose of beta-blocker.  Unfortunately this is confused because both were listed on his med list. I have therefore discontinued the Lopressor prescription and renewed the Toprol prescription.

## 2021-05-28 NOTE — Assessment & Plan Note (Signed)
Doing well.  No recurrent anginal symptoms.  Most recent Myoview years ago, was nonischemic.  He is not having any symptoms, we decided long ago that we would only check screening tests if he were to start having symptoms.  At his advanced age probably not interested in invasive procedures at this time.  Plan: Continue with rosuvastatin and Zetia, Plavix alone without aspirin and was very low-dose Toprol.

## 2021-05-30 ENCOUNTER — Other Ambulatory Visit: Payer: Self-pay | Admitting: Cardiology

## 2021-06-11 ENCOUNTER — Telehealth: Payer: Self-pay | Admitting: Cardiology

## 2021-06-11 NOTE — Telephone Encounter (Signed)
STAT if HR is under 50 or over 120 (normal HR is 60-100 beats per minute)  What is your heart rate? Not sure  Do you have a log of your heart rate readings (document readings)? Went up to 175, then came back to normal after EMS came  Do you have any other symptoms? No   Patient's nephew and POA states the patient was at his assisted living and they had to call EMS. He says the patient's HR went up to 175, but when the ambulance checked him it was back to normal. He states during the episode he was also not responsive and could not answer questions. He states the patient is at his ear appointment now and while walking in got a little out of breathe while walking in to his appointment with the ear doctor. Patient is there now. Patient has an appointment tomorrow with Dr. Ellyn Hack.

## 2021-06-11 NOTE — Telephone Encounter (Signed)
Returned call to patient's nephew (patient was with patient in care) he reports that patient is feeling better and is leaving the ear doctor. Per patients nephew patient had episode of elevated heart rate and decreased consciousness. Per patients nephew EMS was called but patient was not taken to ER. Patient does have appointment tomorrow at 1:20 with Dr. Ellyn Hack. Patients nephew would like someone to call if there is an earlier appointment tomorrow. Advised patient's nephew that if symptoms return or symptoms worsen patient should report to the ER for evaluation. Advised that I would forward to MD to make aware. Patients nephew verbalized understanding.

## 2021-06-12 ENCOUNTER — Other Ambulatory Visit: Payer: Self-pay

## 2021-06-12 ENCOUNTER — Ambulatory Visit (INDEPENDENT_AMBULATORY_CARE_PROVIDER_SITE_OTHER): Payer: Medicare Other | Admitting: Cardiology

## 2021-06-12 ENCOUNTER — Ambulatory Visit (INDEPENDENT_AMBULATORY_CARE_PROVIDER_SITE_OTHER): Payer: Medicare Other

## 2021-06-12 VITALS — BP 140/80 | HR 63 | Ht 72.0 in | Wt 145.8 lb

## 2021-06-12 DIAGNOSIS — E785 Hyperlipidemia, unspecified: Secondary | ICD-10-CM

## 2021-06-12 DIAGNOSIS — R001 Bradycardia, unspecified: Secondary | ICD-10-CM

## 2021-06-12 DIAGNOSIS — I2510 Atherosclerotic heart disease of native coronary artery without angina pectoris: Secondary | ICD-10-CM

## 2021-06-12 DIAGNOSIS — I480 Paroxysmal atrial fibrillation: Secondary | ICD-10-CM

## 2021-06-12 DIAGNOSIS — I479 Paroxysmal tachycardia, unspecified: Secondary | ICD-10-CM

## 2021-06-12 DIAGNOSIS — G459 Transient cerebral ischemic attack, unspecified: Secondary | ICD-10-CM

## 2021-06-12 NOTE — Patient Instructions (Signed)
Dr Ellyn Hack is looking to why you had the fast heart rate.  You will be wearing monitor for 2 weeks.  Dr Ellyn Hack does not want to increase medication ( beta bloocker ) until  result are back fro monitor a n follow up visit   Medication Instructions:  No changes   Lab Work: Not needed    Testing/Procedures: -will be mailed to you  Your physician has recommended that you wear a holter monitor 14 day Zio . Holter monitors are medical devices that record the heart's electrical activity. Doctors most often use these monitors to diagnose arrhythmias. Arrhythmias are problems with the speed or rhythm of the heartbeat. The monitor is a small, portable device. You can wear one while you do your normal daily activities. This is usually used to diagnose what is causing palpitations/syncope (passing out).    Follow-Up: At [facility], you and your health needs are our priority.  As part of our continuing mission to provide you with exceptional heart care, we have created designated Provider Care Teams.  These Care Teams include your primary Cardiologist (physician) and Advanced Practice Providers (APPs -  Physician Assistants and Nurse Practitioners) who all work together to provide you with the care you need, when you need it.  We recommend signing up for the patient portal called "MyChart".  Sign up information is provided on this After Visit Summary.  MyChart is used to connect with patients for Virtual Visits (Telemedicine).  Patients are able to view lab/test results, encounter notes, upcoming appointments, etc.  Non-urgent messages can be sent to your provider as well.   To learn more about what you can do with MyChart, go to [email or web address].    Your next appointment:   2 month(s)  The format for your next appointment:   In Person  Provider:   Glenetta Hew, MD   Other Instructions ZIO XT- Long Term Monitor Instructions  Your physician has requested you wear a ZIO patch monitor  for 14 days.  This is a single patch monitor. [employer] supplies one patch monitor per enrollment. Additional stickers are not available. Please do not apply patch if you will be having a Nuclear Stress Test,  Echocardiogram, Cardiac CT, MRI, or Chest Xray during the period you would be wearing the  monitor. The patch cannot be worn during these tests. You cannot remove and re-apply the  ZIO XT patch monitor.  Your ZIO patch monitor will be mailed 3 day USPS to your address on file. It may take 3-5 days  to receive your monitor after you have been enrolled.  Once you have received your monitor, please review the enclosed instructions. Your monitor  has already been registered assigning a specific monitor serial # to you.  Billing and Patient Assistance Program Information  We have supplied [employer] with any of your insurance information on file for billing purposes. [employer] offers a sliding scale Patient Assistance Program for patients that do not have  insurance, or whose insurance does not completely cover the cost of the ZIO monitor.  You must apply for the Patient Assistance Program to qualify for this discounted rate.  To apply, please call [employer] at [phone], select option 4, select option 2, ask to apply for  Patient Assistance Program. Theodore Demark will ask your household income, and how many people  are in your household. They will quote your out-of-pocket cost based on that information.  [employer] will also be able to set up a 65-month, interest-free payment plan if  needed.  Applying the monitor   Shave hair from upper left chest.  Hold abrader disc by orange tab. Rub abrader in 40 strokes over the upper left chest as  indicated in your monitor instructions.  Clean area with 4 enclosed alcohol pads. Let dry.  Apply patch as indicated in monitor instructions. Patch will be placed under collarbone on left  side of chest with arrow pointing upward.  Rub patch adhesive wings for 2  minutes. Remove white label marked "1". Remove the white  label marked "2". Rub patch adhesive wings for 2 additional minutes.  While looking in a mirror, press and release button in center of patch. A small green light will  flash 3-4 times. This will be your only indicator that the monitor has been turned on.  Do not shower for the first 24 hours. You may shower after the first 24 hours.  Press the button if you feel a symptom. You will hear a small click. Record Date, Time and  Symptom in the Patient Logbook.  When you are ready to remove the patch, follow instructions on the last 2 pages of Patient  Logbook. Stick patch monitor onto the last page of Patient Logbook.  Place Patient Logbook in the blue and white box. Use locking tab on box and tape box closed  securely. The blue and white box has prepaid postage on it. Please place it in the mailbox as  soon as possible. Your physician should have your test results approximately 7 days after the  monitor has been mailed back to [facility]  Call [facility] at [phone] if you have questions regarding  your ZIO XT patch monitor. Call them immediately if you see an orange light blinking on your  monitor.  If your monitor falls off in less than 4 days, contact our Monitor department at [phone].  If your monitor becomes loose or falls off after 4 days call [employer] at [phone] for  suggestions on securing your monitor

## 2021-06-12 NOTE — Progress Notes (Signed)
Primary Care Provider: Deland Pretty, MD Cardiologist: Glenetta Hew, MD Electrophysiologist: None  Clinic Note: Chief Complaint  Patient presents with   Near Syncope    At episode of altered consciousness, found to have tachycardia   Tachycardia    ===================================  ASSESSMENT/PLAN   Problem List Items Addressed This Visit       Cardiology Problems   Dyslipidemia, goal LDL below 70 (Chronic)    Plan is for him to be on rosuvastatin 40 mg daily.  Need to clarify which medication is currently on because of his PCPs list indicating atorvastatin.  The intention was for him to be on ezetimibe plus rosuvastatin.      Paroxysmal tachycardia ([place]) - Primary    Really only had 1 episode of tachycardia that was recorded yesterday.  We do not have any rhythm strips or anything to tell us what it was.  By time EMS got there to get an EKG, his heart rate was better.  Need to exclude or assess for recurrence of A. fib versus potentially SVT.  Stressed the importance of adequate hydration and even salt liberalization.  We can target slightly higher blood pressures if necessary.  I question if maybe he had some vertigo issues associated with his prednisone injections in his ears.  I wonder if that led to some tachycardia while walking due to poor balance.  The interesting feature is that he had this and he did not really feel anything unusual.  He cannot recall actually feeling unusual.  Plan: 14-day Zio patch monitor.      AF (atrial fibrillation) -- Post Operative (Chronic)    He had postop A. fib after his CABG, but for as long as I have known him, he has not had a breakthrough episode of A. fib.  This may be the first episode of a tachycardia spells as been recorded, and therefore I think we need to make sure that he did not have breakthrough A. fib or even potentially SVT.  Plan: Check 14-day Zio patch monitor.      Relevant Orders   EKG 12-Lead (Completed)    LONG TERM MONITOR (3-14 DAYS)   TIA (transient ischemic attack) (Chronic)   Sinus bradycardia (Chronic)    Interestingly, we have backed off his beta-blocker, and his heart rate is now 63, but now becomes in with an episode of tachycardia going into the 170s.  Plan: Check 2-week Zio patch monitor      Relevant Orders   EKG 12-Lead (Completed)   LONG TERM MONITOR (3-14 DAYS)   Two-vessel CAD status post CABG x2;  (Chronic)    Doing well.  No recurrent angina.  Plan per previous discussion was to only evaluate with stress test if he were to have symptoms.  He is not interested in invasive procedures.  Plan:  Continue current dose of rosuvastatin and Zetia.   On very low-dose Toprol -- reduced 2/2 fatigue, but may need to adjust pending Monitor results. Continue Plavix alone without aspirin.      Relevant Orders   EKG 12-Lead (Completed)   ===================================  HPI:    Tanner [name] is a 87 y.o. male with a PMH below who presents today for evaluation of an episode of near syncope and tachycardia.  PMH:  2 V CAD 1991-ACS-BMS PCI of the LAD February 2012: Class IV angina -> two-vessel CAD (diffuse RCA and 80/90 percent ISR in LAD along with occluded D1) => CABG x2 (LIMA-LAD and SVG-RPDA); POST-OP  AFIB (no recurrence) Myoview June 2015: Small apical anterolateral defect consistent with known occlusion of diagonal fills via collaterals.  Medical management. April 2015-TIA CRFs: HTN, HLD  When I saw him back in January 2022, he noted that he just did not the same amount of energy he used to have.  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  Just doing the mild fix-it episodes.  Still doing 20 to 30 minutes of walking daily with [place] chest pressure pain.  Just some exercise intolerance and more easy fatigue. -> I reduced his beta-blocker dose by switching from 25 mg twice daily blood  pressure to 25 mg nightly Toprol and then reduce it to 1/2 tablet-12.5 mg  Theresia Majors was just seen on 05/14/2021 -> again, he noted that he did not feel a strong and energetic as he has been before.  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  Only doing short small little odd jobs.  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  He only now drives to the grocery store, church and doctors visits.'s starting to show signs of being a little bit confused.  Much more hard of hearing.  Somewhat unsure of his medications.  => Other than some mild fatigue/exercise intolerance, troubling symptoms are hearing being worse, and peripheral neuropathy.   -> Really all I did was try to clarify his medications to make sure that he was taking only the Toprol 12.5 mg daily as well as the appropriate statin-rosuvastatin 40 mg.  Recent Hospitalizations: None  Reviewed  CV studies:    The following studies were reviewed today: (if available, images/films reviewed: From Epic Chart or Care Everywhere) None:  Interval History:   SAMYAK SACKMANN returns here today (accompanied by his nephew-POA.  This is the first time he has come with him to help out with providing history), as a work in visit after an episode that occurred yesterday (06/11/2021).  Since I last saw him, he has been having significant worse hearing, and they are actually using high-dose steroid injections directly into the ear to help with the inflammation.  It seems to be helping, but it has thrown his balance off a little bit.  Yesterday (10/10), he apparently was sitting down with his friends outside the cafeteria at friend's home shortly after eating breakfast.  This is a routine for them.  He tells me that his friend encouraged him to go to be evaluated by the nurse.  His nephew indicates that the friend felt like Kunta was becoming lethargic and less  responsive.  He apparently was able to walk to the nurses office.  Was found to have decreased level of consciousness but did not have loss of consciousness.  The nurse indicated his heart rate was up into the 170s, and blood pressure was a little low.  She called EMS, and upon their arrival, his heart rate was back to normal, and he was almost back to his normal baseline sensorium.  As such, they decided not to go into the hospital.  He tells me he felt a little bit worn out by the time.  But really does not recall any unusual symptoms I do not think she realizes that he was somewhat altered.  He denies having any sensation of rapid irregular heartbeats or  palpitations.  He did not pass out, but clearly had some altered sensorium.  He said that within an hour or so of leaving the nurses office, he was back to his baseline has not had any other episodes.  This is the first time he is ever had any symptoms like this.  He had no palpitations or sense of lightheadedness/dizziness.  He did not fall, therefore did not lose full consciousness..  CV Review of Symptoms (Summary) Cardiovascular ROS: positive for - dyspnea on exertion, rapid heart rate, and he still has that decreased exercise tolerance, but that seems to be better..  Mild end of day edema. ->  Despite having someone check his pulse indicating it being in the 170s, he did not feel any rapid irregular heartbeats or palpitations.  He had decreased awareness and forgot most of this. negative for - chest pain, edema, irregular heartbeat, loss of consciousness, orthopnea, palpitations, paroxysmal nocturnal dyspnea, shortness of breath, or did not seem to have TIA or amaurosis fugax symptoms.  REVIEWED OF SYSTEMS   Review of Systems  Constitutional:  Positive for malaise/fatigue (More exercise intolerance than true fatigue.  Just tires out easily) and weight loss (Still losing weight.  Not eating as much.). Negative for chills and fever.  HENT:   Positive for hearing loss (I think his hearing aid is not working today, very). Negative for congestion and nosebleeds.   Respiratory:  Negative for cough and shortness of breath.   Cardiovascular:  Negative for claudication.       Per HPI  Gastrointestinal:  Negative for abdominal pain, blood in stool, melena and nausea.  Genitourinary:  Positive for frequency (Nocturia). Negative for hematuria.  Musculoskeletal:  Positive for back pain and joint pain (Mild stiffening joints.  Not as mobile). Negative for falls.  Neurological:  Positive for dizziness (He thinks he may have been a little bit dizzy yesterday, but really cannot recall it.  Somebody told him he did). Negative for focal weakness, loss of consciousness (Did not lose consciousness, but since clearly had altered awareness and responsiveness.) and weakness.  Psychiatric/Behavioral:  Positive for memory loss. Negative for depression. The patient is not nervous/anxious and does not have insomnia (Just frequent nocturia).        Still dealing with the stress of being caregiver for his wife who has end-stage dementia.   I have reviewed and (if needed) personally updated the patient's problem list, medications, allergies, past medical and surgical history, social and family history.   PAST MEDICAL HISTORY   Past Medical History:  Diagnosis Date   Anticoagulant long-term use    plavix  (for hx TIA)   Arthritis    right shoulder    Benign essential tremor    hands -- occasional   CAD S/P percutaneous coronary angioplasty cardiologist-- dr Muaaz Brau   a) PCI and BMS to LAD in 1991; b) CATH 10/2010: Severe 2 Vessel disease = diffuse RCA ( 75% prox, 90% mid & 70 + 75% distal) & LAD (80-90% pre-stent, 75% post-stent) with occluded D1 (filled via OM-D1 collaterals)  -- CABG x 2; c) Myoview 4/'15: INTERMEDIATE RISK - small sized reversible apical/anterolateral defect -- most likely c/w known occlusion of D1 with collaterals.(med Rx)   History of  basal cell carcinoma (BCC) excision    History of bladder cancer urologist-- dr eskridge   dx 11/ 2015-- s/p TURBT   History of kidney stones    History of TIA (transient ischemic attack) 12/09/2013   History of  urethral stricture    Hyperlipidemia with target LDL less than 70    controlled.   Neuropathy of lower extremity    Nocturia    Osteoporosis    Peripheral neuropathy    lower extremity   Postoperative atrial fibrillation ([place]) 10/2010   after bypass-"shocked back in and no problems since":   per cardiologist note, dr Ellyn Hack, no recurrence   S/P CABG x 2 10/18/2010   LIMA-LAD, SVG to RPDA (Dr. [name])   [place] (transient global amnesia)    "x3 for hours then goes away"   Transient Dilated idiopathic cardiomyopathy April 2015; 04/2014   a) in setting of TIA: EF 35-40% (reduced from 55-65 percent) mild AI, moderate and dilated aorta.;; b) Recheck Echo 04/2014: EF 55-60%, Gr 1 DD, mild-mod AI.     Wears glasses    Wears partial dentures    upper    PAST SURGICAL HISTORY   Past Surgical History:  Procedure Laterality Date   APPENDECTOMY  as teen   BASAL CELL CARCINOMA EXCISION  07/2014   CARDIAC CATHETERIZATION  10/17/2010   80%prox w/mild in-stent restenosis w/75% mid-LADstenosis aft the stent,multi severe stenoses RCAw/heavily calcified vessel,norm LV   CARDIOVERSION  2012   after bypass surgery   CATARACT EXTRACTION W/ INTRAOCULAR LENS  IMPLANT, BILATERAL  2009  approx.   COCCYX REMOVAL     CORONARY ANGIOPLASTY WITH STENT PLACEMENT  1991   PCI and BMS to LAD   CORONARY ARTERY BYPASS GRAFT  10-18-2010  dr Ricard Dillon @[facility]    LIMA-LAD, SVG-rPDA   CYSTOSCOPY W/ RETROGRADES N/A 07/12/2014   Procedure: CYSTOSCOPY WITH RETROGRADE PYELOGRAM;  Surgeon: [name], MD;  Location: [facility];  Service: Urology;  Laterality: N/A;   CYSTOSCOPY WITH BIOPSY N/A 09/09/2014   Procedure: CYSTO WITH BIOPSY AND FULGERATION, DILATION OF URETHRAL STRICTURE;  Surgeon: [name], MD;  Location:  [facility];  Service: Urology;  Laterality: N/A;  BLADDER BIOPSY   CYSTOSCOPY WITH BIOPSY N/A 01/03/2015   Procedure: CYSTOSCOPY WITH BIOPSY;  Surgeon: [name], MD;  Location: [facility];  Service: Urology;  Laterality: N/A;   CYSTOSCOPY WITH BIOPSY N/A 01/26/2019   Procedure: CYSTOSCOPY WITH BIOPSY, BILATERAL RETROGRADE PYELOGRAM;  Surgeon: [name], MD;  [facility];  Service: Urology;  Laterality: N/A;   CYSTOSCOPY WITH FULGERATION N/A 01/26/2019   Procedure: CYSTOSCOPY WITH FULGERATION;  Surgeon: [name], MD;  Location: [facility];  Service: Urology;  Laterality: N/A;   CYSTOSCOPY WITH URETHRAL DILATATION N/A 01/03/2015   Procedure: CYSTOSCOPY WITH URETHRAL DILATATION;  Surgeon: [name], MD;  Location: [facility];  Service: Urology;  Laterality: N/A;   DOPPLER ECHOCARDIOGRAPHY  10/18/2004   EF 55-65%,BORDERLINE -enlarged aortic root,mild aortic insuff.,trace tricus.insuff.   FINGER SURGERY Left 03-17-2001   dr Burney Gauze  @[facility]    repair complex laceration injury 5th digit   KIDNEY STONE SURGERY  1989   NM MYOVIEW LTD  12/16/2013   INTERMEDIATE RISK test with small sized reversible perfusion defect in the apical anterolateral wall -- most likely consistent with known occlusion of D1 with collaterals.   shoulder surgery Right 1988   TONSILLECTOMY  as teen   TRANSTHORACIC ECHOCARDIOGRAM  April 2015   EF 35-40%, mild AI, moderately dilated ascending aorta   TRANSURETHRAL RESECTION OF PROSTATE N/A 07/12/2014   Procedure: TRANSURETHRAL RESECTION OF THE PROSTATE (TURP) WITH MITOMYCIN -C;  Surgeon: [name], MD;  Location: [facility];  Service: Urology;  Laterality: N/A;    Immunization History  Administered Date(s) Administered   Influenza-Unspecified 06/02/2014    MEDICATIONS/ALLERGIES   Current Outpatient Medications  Medication Instructions   clopidogrel (PLAVIX) 75 MG tablet TAKE 1 TABLET BY MOUTH DAILY   Cyanocobalamin  (VITAMIN B12) 1000 MCG TBCR 5,000 Int'l Units, Buccal, Daily   ezetimibe (ZETIA) 10 mg, Oral, Daily   fish oil-omega-3 fatty acids 1 g, Oral, Daily   gabapentin (NEURONTIN) 100 MG capsule TAKE 1 CAPSULE BY MOUTH TWICE DAILY FOR NEUROPATHY   Jardiance 10 mg, Oral, Daily   metoprolol succinate (TOPROL XL) 12.5 mg, Oral, Daily   Multiple Vitamin (MULTIVITAMIN) capsule 1 capsule, Oral, Daily at bedtime   Prolia 60 mg, Subcutaneous, Every 6 months   rosuvastatin (CRESTOR) 40 mg, Oral, Daily   vitamin D3 5,000 mcg, Buccal, Daily   He is also getting prednisone injection into the ear every week for 3 weeks.  No Known Allergies  SOCIAL HISTORY/FAMILY HISTORY   Reviewed in Epic:  Pertinent findings:  Social History   Tobacco Use   Smoking status: Former    Packs/day: 1.00    Years: 5.00    Pack years: 5.00    Types: Cigarettes    Quit date: 09/02/1957    Years since quitting: 63.8   Smokeless tobacco: Never  Vaping Use   Vaping Use: Never used  Substance Use Topics   Alcohol use: Never    Alcohol/week: 0.0 standard drinks   Drug use: Never   Social History   Social History Narrative   Pt lives at home with spouse.   Caffeine Use: very little      He stopped doing the [employer] building about 6 months ago, indicating that he just has not had the same out of energy.  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  He says that now he can only do about 2 and half hours worth of work before you stop and rest.    OBJCTIVE -PE, EKG, labs   Wt Readings from Last 3 Encounters:  06/12/21 145 lb 12.8 oz (66.1 kg)  05/14/21 150 lb (68 kg)  09/12/20 154 lb (69.9 kg)    Physical Exam: BP 140/80   Pulse 63   Ht 6' (1.829 m)   Wt 145 lb 12.8 oz (66.1 kg)   SpO2 98%   BMI 19.77 kg/m  Physical Exam Vitals reviewed.  Constitutional:      General: He is not in acute distress.    Appearance: He is not ill-appearing (Thin, frail elderly  gentleman.) or toxic-appearing.     Comments: Well-groomed, but definitely seems thinner, more frail.  Clearly showing signs of his age.  HENT:     Head: Normocephalic and atraumatic.  Neck:     Vascular: No carotid bruit or JVD.  Cardiovascular:     Rate and Rhythm: Normal rate. Rhythm irregular. Occasional Extrasystoles are present.    Chest Wall: PMI is not displaced.     Pulses: Intact distal pulses. Decreased pulses.     Heart sounds: S1 normal and S2 normal. Murmur (1/6 SEM at RUSB -> neck) heard.    No friction rub. No gallop.  Pulmonary:     Effort: Pulmonary effort is normal. No respiratory distress.     Breath sounds: Normal breath sounds. No rhonchi or rales.  Musculoskeletal:        General: Deformity (Foot drop) present. No swelling.     Cervical back: Normal range of motion and neck supple.  Skin:  General: Skin is warm and dry.  Neurological:     General: No focal deficit present.     Mental Status: He is alert and oriented to person, place, and time.     Gait: Gait abnormal (A little bit less smooth and steady. ->  Stable foot drop).     Comments: Not quite as sharp as he usually is.  Psychiatric:        Mood and Affect: Mood normal.        Behavior: Behavior normal.     Comments: Still has fluent speech, just has poor recollection of events.  Not as sharp answering questions.    Adult ECG Report  Rate: 63;  Rhythm: normal sinus rhythm, sinus arrhythmia, and otherwise normal axis, intervals and durations. ;   Narrative Interpretation: Stable  Recent Labs: Due for labs recheck next month by PCP.  Current labs reviewed 06/29/2020: TC 226, TG 140, HDL 45, LDL 156, CR 1.01.  ==================================================  COVID-19 Education: The signs and symptoms of COVID-19 were discussed with the patient and how to seek care for testing (follow up with PCP or arrange E-visit).    I spent a total of 26 minutes with the patient spent in direct patient  consultation.  -> He is very hard of hearing, sometimes have to say things at least 3 times for him to understand.  He had lots of questions and was very confused about his medications today.  Longer present than usual for him. Additional time spent with chart review  / charting (studies, outside notes, etc): 16 min Total Time: 42 min  Current medicines are reviewed at length with the patient today.  (+/- concerns) very confusing med list.  This visit occurred during the SARS-CoV-2 public health emergency.  Safety protocols were in place, including screening questions prior to the visit, additional usage of staff PPE, and extensive cleaning of exam room while observing appropriate contact time as indicated for disinfecting solutions.  Notice: This dictation was prepared with Dragon dictation along with smaller phrase technology. Any transcriptional errors that result from this process are unintentional and may not be corrected upon review.  Patient Instructions / Medication Changes & Studies & Tests Ordered   Patient Instructions  Dr Ellyn Hack is looking to why you had the fast heart rate.  You will be wearing monitor for 2 weeks.  Dr Ellyn Hack does not want to increase medication ( beta bloocker ) until  result are back fro monitor a n follow up visit   Medication Instructions:  No changes   Lab Work: Not needed    Testing/Procedures: -will be mailed to you  Your physician has recommended that you wear a holter monitor 14 day Zio . Holter monitors are medical devices that record the heart's electrical activity. Doctors most often use these monitors to diagnose arrhythmias. Arrhythmias are problems with the speed or rhythm of the heartbeat. The monitor is a small, portable device. You can wear one while you do your normal daily activities. This is usually used to diagnose what is causing palpitations/syncope (passing out).    Follow-Up: At [facility], you and your health needs are our  priority.  As part of our continuing mission to provide you with exceptional heart care, we have created designated Provider Care Teams.  These Care Teams include your primary Cardiologist (physician) and Advanced Practice Providers (APPs -  Physician Assistants and Nurse Practitioners) who all work together to provide you with the care you need, when you  need it.  We recommend signing up for the patient portal called "MyChart".  Sign up information is provided on this After Visit Summary.  MyChart is used to connect with patients for Virtual Visits (Telemedicine).  Patients are able to view lab/test results, encounter notes, upcoming appointments, etc.  Non-urgent messages can be sent to your provider as well.   To learn more about what you can do with MyChart, go to [email or web address].    Your next appointment:   2 month(s)  The format for your next appointment:   In Person  Provider:   Glenetta Hew, MD   Other Instructions ZIO XT- Long Term Monitor Instructions  Studies Ordered:   Orders Placed This Encounter  Procedures   LONG TERM MONITOR (3-14 DAYS)   EKG 12-Lead     Glenetta Hew, M.D., M.S. Interventional Cardiologist   Pager # [phone] Phone # [phone] [address] [place], [place] [address]   Thank you for choosing Heartcare at [facility]!!

## 2021-06-12 NOTE — Progress Notes (Unsigned)
Patient enrolled for [employer] to mail a 14 day ZIO XT monitor to his home. 

## 2021-06-12 NOTE — Telephone Encounter (Signed)
Patient seen in clinic today.  He had 1 short spell of dizziness and altered sensorium yesterday morning.  On evaluation was found to be tachycardic.  He has been feeling fine ever since then.  Obviously the pressing question is that he have a run of A. fib or SVT.  He does have a history of postop A. fib years ago. We have ordered a monitor to assess.  Would not make any further adjustments until we see the results of the monitor.   Glenetta Hew, MD

## 2021-06-14 ENCOUNTER — Encounter: Payer: Self-pay | Admitting: Cardiology

## 2021-06-14 DIAGNOSIS — I479 Paroxysmal tachycardia, unspecified: Secondary | ICD-10-CM | POA: Insufficient documentation

## 2021-06-14 NOTE — Assessment & Plan Note (Addendum)
Doing well.  No recurrent angina.  Plan per previous discussion was to only evaluate with stress test if he were to have symptoms.  He is not interested in invasive procedures.  Plan:  Continue current dose of rosuvastatin and Zetia.   On very low-dose Toprol -- reduced 2/2 fatigue, but may need to adjust pending Monitor results. Continue Plavix alone without aspirin.

## 2021-06-14 NOTE — Assessment & Plan Note (Signed)
Plan is for him to be on rosuvastatin 40 mg daily.  Need to clarify which medication is currently on because of his PCPs list indicating atorvastatin.  The intention was for him to be on ezetimibe plus rosuvastatin.

## 2021-06-14 NOTE — Assessment & Plan Note (Signed)
Really only had 1 episode of tachycardia that was recorded yesterday.  We do not have any rhythm strips or anything to tell us what it was.  By time EMS got there to get an EKG, his heart rate was better.  Need to exclude or assess for recurrence of A. fib versus potentially SVT.  Stressed the importance of adequate hydration and even salt liberalization.  We can target slightly higher blood pressures if necessary.  I question if maybe he had some vertigo issues associated with his prednisone injections in his ears.  I wonder if that led to some tachycardia while walking due to poor balance.  The interesting feature is that he had this and he did not really feel anything unusual.  He cannot recall actually feeling unusual.  Plan: 14-day Zio patch monitor.

## 2021-06-14 NOTE — Assessment & Plan Note (Signed)
He had postop A. fib after his CABG, but for as long as I have known him, he has not had a breakthrough episode of A. fib.  This may be the first episode of a tachycardia spells as been recorded, and therefore I think we need to make sure that he did not have breakthrough A. fib or even potentially SVT.  Plan: Check 14-day Zio patch monitor.

## 2021-06-14 NOTE — Assessment & Plan Note (Signed)
Interestingly, we have backed off his beta-blocker, and his heart rate is now 63, but now becomes in with an episode of tachycardia going into the 170s.  Plan: Check 2-week Zio patch monitor

## 2021-06-18 DIAGNOSIS — I480 Paroxysmal atrial fibrillation: Secondary | ICD-10-CM

## 2021-06-18 DIAGNOSIS — R001 Bradycardia, unspecified: Secondary | ICD-10-CM

## 2021-07-05 ENCOUNTER — Telehealth: Payer: Self-pay | Admitting: Cardiology

## 2021-07-05 NOTE — Telephone Encounter (Signed)
Spoke  to [place] ( [facility]) .  Barnabas Lister states  that Mr Forbush had the nurse at [facility] to help place the monitor on patient. Barnabas Lister states the nurse at [facility] - informed them she could not remove Zio monitor without an order.  RN informed Barnabas Lister  that Barnabas Lister could go an take monitor off patient and send it back to the company as instructed initially  with out [facility] doing it.  [facility] nurse would need  an order to do anything with monitor.  Barnabas Lister verbalized understanding. Barnabas Lister states that patient and patient's wife  has had covid.   Patient has sinus infection an mucous is abnormal - requesting an antibiotic  RN  instructed Barnabas Lister to contact  patient's primary .  Barnabas Lister verbalized understanding.

## 2021-07-05 NOTE — Telephone Encounter (Signed)
POA called bout the returning the patient heart monitor

## 2021-07-11 ENCOUNTER — Telehealth: Payer: Self-pay | Admitting: Cardiology

## 2021-07-11 MED ORDER — METOPROLOL SUCCINATE ER 25 MG PO TB24: 25.0000 mg | ORAL_TABLET | Freq: Every day | ORAL | 3 refills | Status: DC

## 2021-07-11 NOTE — Telephone Encounter (Signed)
   Increase Toprol to 25 mg daily.  Follow-up Dr. Ellyn Hack as scheduled.  Tanner [name]     Unable to [facility] on his preferred number... no answer... no voicemail set up on his mobile number... will need to try again 07/12/21.

## 2021-07-11 NOTE — Telephone Encounter (Signed)
Chritsina Lyna Poser called to reports the pt had a 14 day Zio and he had an episode of SVT on 06/30/21 at 8:47 am rate 185 lasting 60 seconds.   Pt had a total of 549 SVT events while wearing the patch.   Will forward to Dr. Ellyn Hack and his nurse for review.   Pt has return OV 08/02/21.  Pt is taking Toprol XL 12.5 mg daily.

## 2021-07-11 NOTE — Telephone Encounter (Signed)
Called Pt informed of providers result & recommendations. Pt verbalized understanding. No further questions . Verified pharmacy. New rx sent.

## 2021-07-11 NOTE — Telephone Encounter (Signed)
Christina from [employer] calling to give abnormal results

## 2021-07-18 ENCOUNTER — Telehealth: Payer: Self-pay | Admitting: *Deleted

## 2021-07-18 NOTE — Telephone Encounter (Signed)
The patient has been notified of the result and verbalized understanding.  All questions (if any) were answered.  Information was given to DOD on 07/11/21- per order patient was to increase Metoprolol  succinate to 25 mg day from 12.5 mg. Patient states he has changed to new dosage. Aware to keep appointment on 08/02/21. medication list was updated on 07/11/21 Raiford Simmonds, RN 07/18/2021 2:52 PM

## 2021-07-18 NOTE — Telephone Encounter (Signed)
-----   Message from [name], MD sent at 07/18/2021  1:32 AM EST ----- Monitor results:  Narrative & Impression   Predominant underlying rhythm Sinus Rhythm with first-degree AV block: Minimum heart rate 53 bpm, maximum 132 bpm, average 80 bpm.  Occasional isolated PACs (1.8%), with rare couplets and triplets.  Occasional isolated PVCs (3.8%) with rare couplets and triplets. 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  PSVT/PAT episode with fastest average heart rate 1:27 min (266 beats) -> average heart rate 184 bpm.  Longest PSVT/PAT interval was 4:09 min with an average rate of 102 bpm. (425 beats)  1 very short episode of intermittent idioventricular rhythm also noted at 5:30 AM..  No events triggered or noted on diary.  Minimum heart rate of 53 bpm sinus rhythm at 1:45 AM. Maximum sinus heart rate 132 bpm was 5:12 PM   Monitor confirms quite frequent bursts of PSVT or PAT with pretty fast heart rates lasting up to 4 minutes.  Probably not enough to cause any adverse events, but may be notable on symptoms. No significant bradycardia noted which was the reason for him to monitor.  Would probably consider low-dose AV nodal agent if symptomatic with PSVT/PAT.     Glenetta Hew, MD

## 2021-08-02 ENCOUNTER — Ambulatory Visit: Payer: Medicare Other | Admitting: Cardiology

## 2021-09-12 ENCOUNTER — Other Ambulatory Visit: Payer: Self-pay | Admitting: Cardiology

## 2021-11-28 ENCOUNTER — Other Ambulatory Visit: Payer: Self-pay | Admitting: Cardiology

## 2021-12-05 ENCOUNTER — Other Ambulatory Visit: Payer: Self-pay | Admitting: Cardiology

## 2022-03-22 ENCOUNTER — Emergency Department (HOSPITAL_BASED_OUTPATIENT_CLINIC_OR_DEPARTMENT_OTHER)
Admission: EM | Admit: 2022-03-22 | Discharge: 2022-03-22 | Disposition: A | Discharge status: Home / Self Care | Payer: Medicare Other | Attending: Emergency Medicine | Admitting: Emergency Medicine

## 2022-03-22 ENCOUNTER — Other Ambulatory Visit: Payer: Self-pay

## 2022-03-22 ENCOUNTER — Encounter (HOSPITAL_BASED_OUTPATIENT_CLINIC_OR_DEPARTMENT_OTHER): Payer: Self-pay | Admitting: Emergency Medicine

## 2022-03-22 ENCOUNTER — Emergency Department (HOSPITAL_BASED_OUTPATIENT_CLINIC_OR_DEPARTMENT_OTHER): Payer: Medicare Other

## 2022-03-22 DIAGNOSIS — W01198A Fall on same level from slipping, tripping and stumbling with subsequent striking against other object, initial encounter: Secondary | ICD-10-CM | POA: Diagnosis not present

## 2022-03-22 DIAGNOSIS — Z7902 Long term (current) use of antithrombotics/antiplatelets: Secondary | ICD-10-CM | POA: Diagnosis not present

## 2022-03-22 DIAGNOSIS — S0181XA Laceration without foreign body of other part of head, initial encounter: Secondary | ICD-10-CM | POA: Insufficient documentation

## 2022-03-22 DIAGNOSIS — S0993XA Unspecified injury of face, initial encounter: Secondary | ICD-10-CM | POA: Diagnosis present

## 2022-03-22 NOTE — ED Triage Notes (Signed)
Tripped and fell onto concrete this morning. Denies LOC. Laceration to R cheek noted. Denies neck or back pain. He takes plavix.

## 2022-03-22 NOTE — ED Provider Notes (Signed)
[facility] EMERGENCY DEPT Provider Note   CSN: [id number] Arrival date & time: 03/22/22  1053     History  Chief Complaint  Patient presents with   Tanner [name] is a 88 y.o. male.  Patient reports he was walking with his walker and caught his foot on the walker causing him to fall   Pt scraped up the right side of his face.   No loss of consciousness ,patient is on a blood thinner.  Patient had first aid at the facility where he resides and was sent here for evaluation due to hitting his head and being on a blood thinner.  Patient denies any other areas of injuries he does not have any chest pain abdominal pain denies any extremity pain he has not any difficulty walking he denies any dizziness no blurred vision no hearing changes  The history is provided by the patient. No language interpreter was used.  Fall This is a new problem. The current episode started 1 to 2 hours ago. The problem occurs constantly. The problem has not changed since onset.Pertinent negatives include no chest pain, no headaches and no shortness of breath. Nothing aggravates the symptoms. Nothing relieves the symptoms. He has tried nothing for the symptoms.       Home Medications Prior to Admission medications   Medication Sig Start Date End Date Taking? Authorizing Provider  Cholecalciferol (VITAMIN D3) 50 MCG (2000 UT) CAPS Place 5,000 mcg inside cheek daily.    [provider]  clopidogrel (PLAVIX) 75 MG tablet TAKE 1 TABLET BY MOUTH DAILY 11/28/21   [name], MD  Cyanocobalamin (VITAMIN B12) 1000 MCG TBCR Place 5,000 Int'l Units inside cheek daily.    [provider]  denosumab (PROLIA) 60 MG/ML SOSY injection Inject 60 mg into the skin every 6 (six) months. 11/10/19   [provider]  ezetimibe (ZETIA) 10 MG tablet Take 10 mg by mouth daily.    [provider]  fish oil-omega-3 fatty acids 1000 MG capsule Take 1 g by mouth daily.      [provider]  gabapentin (NEURONTIN) 100 MG capsule TAKE 1 CAPSULE BY MOUTH TWICE DAILY FOR NEUROPATHY    [provider]  JARDIANCE 10 MG TABS tablet Take 10 mg by mouth daily. 04/18/21   [provider]  metoprolol succinate (TOPROL-XL) 25 MG 24 hr tablet TAKE 1/2 TABLET BY MOUTH DAILY 12/05/21   [name], MD  Multiple Vitamin (MULTIVITAMIN) capsule Take 1 capsule by mouth at bedtime.     [provider]  rosuvastatin (CRESTOR) 40 MG tablet TAKE 1 TABLET BY MOUTH DAILY 09/12/21   Minus Breeding, MD      Allergies    Patient has no known allergies.    Review of Systems   Review of Systems  Respiratory:  Negative for shortness of breath.   Cardiovascular:  Negative for chest pain.  Neurological:  Negative for headaches.  All other systems reviewed and are negative.   Physical Exam Updated Vital Signs BP 135/86 (BP Location: Right Arm)   Pulse 65   Temp 98.1 F (36.7 C)   Resp 17   Ht '5\' 10"'$  (1.778 m)   Wt 64.4 kg   SpO2 97%   BMI 20.37 kg/m  Physical Exam Vitals and nursing note reviewed.  Constitutional:      Appearance: He is well-developed.  HENT:     Head: Normocephalic.     Nose: Nose normal.  Mouth/Throat:     Mouth: Mucous membranes are moist.  Cardiovascular:     Rate and Rhythm: Normal rate.  Pulmonary:     Effort: Pulmonary effort is normal.  Abdominal:     General: There is no distension.  Musculoskeletal:        General: Normal range of motion.     Cervical back: Normal range of motion.  Skin:    Comments: Superficial 2 cm skin tear right cheek, 7 mm skin tear right forehead  Neurological:     Mental Status: He is alert and oriented to person, place, and time.  Psychiatric:        Mood and Affect: Mood normal.     ED Results / Procedures / Treatments   Labs (all labs ordered are listed, but only abnormal results are displayed) Labs Reviewed - No data to display  EKG None  Radiology CT Head Wo  Contrast  Result Date: 03/22/2022 CLINICAL DATA:  Patient fell onto concrete this morning. Laceration right cheek. EXAM: CT HEAD WITHOUT CONTRAST TECHNIQUE: Contiguous axial images were obtained from the base of the skull through the vertex without intravenous contrast. RADIATION DOSE REDUCTION: This exam was performed according to the departmental dose-optimization program which includes automated exposure control, adjustment of the mA and/or kV according to patient size and/or use of iterative reconstruction technique. COMPARISON:  12/09/2013 FINDINGS: Brain: There is no evidence for acute hemorrhage, hydrocephalus, mass lesion, or abnormal extra-axial fluid collection. No definite CT evidence for acute infarction. Diffuse loss of parenchymal volume is consistent with atrophy. Patchy low attenuation in the deep hemispheric and periventricular white matter is nonspecific, but likely reflects chronic microvascular ischemic demyelination. Vascular: No hyperdense vessel or unexpected calcification. Skull: No evidence for fracture. No worrisome lytic or sclerotic lesion. Sinuses/Orbits: The visualized paranasal sinuses and mastoid air cells are clear. Visualized portions of the globes and intraorbital fat are unremarkable. Other: None. IMPRESSION: 1. No acute intracranial abnormality. 2. Atrophy with chronic small vessel ischemic disease. Electronically Signed   By: Misty Stanley M.D.   On: 03/22/2022 11:37    Procedures Procedures    Medications Ordered in ED Medications - No data to display  ED Course/ Medical Decision Making/ A&P                           Medical Decision Making Patient tripped and fell hitting his face  Amount and/or Complexity of Data Reviewed Independent Historian:     Details: Pt's son is here.  He reports pt is acting normally Radiology: ordered and independent interpretation performed. Decision-making details documented in ED Course.    Details: CT head shows no acute  intercranial abnormality  Risk Risk Details: Patient looks good overall Steri-Strips will be replaced patient is advised to return if any problems           Final Clinical Impression(s) / ED Diagnoses Final diagnoses:  Facial laceration, initial encounter    Rx / DC Orders ED Discharge Orders     None     An After Visit Summary was printed and given to the patient.     Sidney Ace 03/22/22 [id number]    Wyvonnia Dusky, MD 03/23/22 [phone]

## 2022-03-22 NOTE — Discharge Instructions (Signed)
Return if any problems.

## 2022-05-17 ENCOUNTER — Telehealth: Payer: Self-pay | Admitting: Cardiology

## 2022-05-17 ENCOUNTER — Other Ambulatory Visit: Payer: Self-pay | Admitting: Cardiology

## 2022-05-17 ENCOUNTER — Other Ambulatory Visit: Payer: Self-pay | Admitting: Urology

## 2022-05-17 NOTE — Telephone Encounter (Signed)
Primary Cardiologist:David Ellyn Hack, MD  Chart reviewed as part of pre-operative protocol coverage. Because of Tanner [name]'s past medical history and time since last visit, he/she will require a follow-up visit in order to better assess preoperative cardiovascular risk.  Pre-op covering staff: - Please schedule appointment and call patient to inform them. - Please contact requesting surgeon's office via preferred method (i.e, phone, fax) to inform them of need for appointment prior to surgery.  Request to hold Plavix for upcoming procedure. It is reasonable to hold Plavix for 5 days given no recent ACS and high risk of bleeding with the procedure.   Emmaline Life, NP-C     05/17/2022, 12:51 PM [address] Office [phone] Fax [phone]

## 2022-05-17 NOTE — Telephone Encounter (Signed)
I left a message for the pt to call the office  to schedule an IN OFFICE APPT for pre op clearance.

## 2022-05-17 NOTE — Telephone Encounter (Signed)
   Pre-operative Risk Assessment    Patient Name: Tanner [name]  DOB: Apr 16, 1924 MRN: [id number]      Request for Surgical Clearance    Procedure:  Transurethal Resection of Bladder Tumor  Date of Surgery:  Clearance 06/11/22                                 Surgeon:  Dr. [name] Surgeon's Group or Practice Name:  [facility] Phone number:  [phone] Fax number:  [phone]   Type of Clearance Requested:   - Medical  - Pharmacy:  Hold Clopidogrel (Plavix) 5 days prior   Type of Anesthesia:  General    Additional requests/questions:  Please fax a copy of medical clearance to the surgeon's office.  Rutherford Guys Pough   05/17/2022, 10:07 AM

## 2022-05-17 NOTE — Telephone Encounter (Signed)
Pt has appt 05/22/22 @ 1:55 with Almyra Deforest, PAC

## 2022-05-22 ENCOUNTER — Encounter: Payer: Self-pay | Admitting: Physician Assistant

## 2022-05-22 ENCOUNTER — Ambulatory Visit: Payer: Medicare Other | Attending: Physician Assistant | Admitting: Physician Assistant

## 2022-05-22 VITALS — BP 120/66 | HR 73 | Ht 70.0 in | Wt 148.8 lb

## 2022-05-22 DIAGNOSIS — I10 Essential (primary) hypertension: Secondary | ICD-10-CM | POA: Diagnosis not present

## 2022-05-22 DIAGNOSIS — I25810 Atherosclerosis of coronary artery bypass graft(s) without angina pectoris: Secondary | ICD-10-CM | POA: Diagnosis not present

## 2022-05-22 DIAGNOSIS — E785 Hyperlipidemia, unspecified: Secondary | ICD-10-CM

## 2022-05-22 DIAGNOSIS — Z01818 Encounter for other preprocedural examination: Secondary | ICD-10-CM | POA: Diagnosis not present

## 2022-05-22 DIAGNOSIS — G459 Transient cerebral ischemic attack, unspecified: Secondary | ICD-10-CM

## 2022-05-22 DIAGNOSIS — I480 Paroxysmal atrial fibrillation: Secondary | ICD-10-CM

## 2022-05-22 NOTE — Patient Instructions (Addendum)
Medication Instructions:  HOLD Plavix for 5 days before procedure  HOLD Fish Oil for 7 days before procedure   *If you need a refill on your cardiac medications before your next appointment, please call your pharmacy*  Lab Work: NONE ordered at this time of appointment   If you have labs (blood work) drawn today and your tests are completely normal, you will receive your results only by: [place] (if you have MyChart) OR A paper copy in the mail If you have any lab test that is abnormal or we need to change your treatment, we will call you to review the results.  Testing/Procedures: NONE ordered at this time of appointment   Follow-Up: At [facility], you and your health needs are our priority.  As part of our continuing mission to provide you with exceptional heart care, we have created designated Provider Care Teams.  These Care Teams include your primary Cardiologist (physician) and Advanced Practice Providers (APPs -  Physician Assistants and Nurse Practitioners) who all work together to provide you with the care you need, when you need it.  We recommend signing up for the patient portal called "MyChart".  Sign up information is provided on this After Visit Summary.  MyChart is used to connect with patients for Virtual Visits (Telemedicine).  Patients are able to view lab/test results, encounter notes, upcoming appointments, etc.  Non-urgent messages can be sent to your provider as well.   To learn more about what you can do with MyChart, go to [email or web address].    Your next appointment:   6 month(s)  The format for your next appointment:   In Person  Provider:   Glenetta Hew, MD     Other Instructions   Important Information About Sugar

## 2022-05-22 NOTE — Progress Notes (Signed)
Cardiology Office Note:    Date:  05/24/2022   ID:  Tanner [name], DOB [birth year], MRN [id number]  PCP:  Deland Pretty, [place] [facility] Cardiologist:  Glenetta Hew, MD     Referring MD: Deland Pretty, MD   Chief Complaint  Patient presents with   Follow-up    Seen for Dr. Ellyn Hack   Pre-op Exam    Pending transurethral resection of the bladder tumor with right ureteral stent by Dr. [name]    History of Present Illness:    Tanner [name] is a 88 y.o. male with a hx of hypertension, hyperlipidemia, CAD s/p CABG x2 with LIMA to LAD and SVG to RPDA, postop atrial fibrillation and TIA.  Patient previously had ACS and now underwent BMS to LAD in 1991.  Due to unstable angina, he underwent cardiac catheterization again in February 2012 this revealed diffuse RCA disease and in-stent restenosis in the LAD with occluded D1.  He subsequently underwent LIMA to LAD and SVG to RPDA.  Postop course was complicated by single episode of A-fib without recurrence.  Myoview in June 2015 showed small apical anterolateral defect consistent with known occluded diagonal vessel filled with collaterals, medical therapy was recommended.  He had a TIA in April 2015.  Due to fatigue, beta-blocker was reduced.  He was last seen by Dr. Ellyn Hack on 06/12/2021, the day prior to that he had a prolonged episode of palpitation.  Heart monitor was ordered which revealed minimal heart rate 53, maximal heart rate of 132, average heart rate 80 bpm, PVC burden 3.8%, frequent 549 bursts of PAT/PSVT, not which lasted very long.  No atrial fibrillation.  Metoprolol was increased to 25 mg daily from the previous 12.5 mg.  Since last visit, patient did go to the emergency room on 7/21 after a mechanical fall that resulted in laceration on the right side of his face.  CT of the head showed no acute intracranial abnormality, it did reveal atrophy with chronic small vessel ischemic disease.  Patient presents today  along with his nephew.  For the past year, he has been doing well without any exertional chest pain or worsening dyspnea.  His apartment at [facility] is about a 10-minute walk from the cafeteria, he can walk to the cafeteria and back without any issue.  EKG shows no significant changes.  He has no lower extremity edema.  He will occasionally have pain in his left posterior neck, I suspect this is muscle spasm as it worsens with turning his head.  Overall, he seems to be stable from the cardiac perspective.  His advanced age and cardiac history we will put him at least as a moderate risk patient going for low risk transurethral resection of the bladder tumor with right ureteral stent, however I do not think the procedure is prohibitive.  He may hold Plavix for 5 days and fish oil for 7 days prior to the procedure and restart as soon as possible afterward at the surgeon's discretion.   Past Medical History:  Diagnosis Date   Anticoagulant long-term use    plavix  (for hx TIA)   Arthritis    right shoulder    Benign essential tremor    hands -- occasional   CAD S/P percutaneous coronary angioplasty cardiologist-- dr harding   a) PCI and BMS to LAD in 1991; b) CATH 10/2010: Severe 2 Vessel disease = diffuse RCA ( 75% prox, 90% mid & 70 + 75% distal) &  LAD (80-90% pre-stent, 75% post-stent) with occluded D1 (filled via OM-D1 collaterals)  -- CABG x 2; c) Myoview 4/'15: INTERMEDIATE RISK - small sized reversible apical/anterolateral defect -- most likely c/w known occlusion of D1 with collaterals.(med Rx)   History of basal cell carcinoma (BCC) excision    History of bladder cancer urologist-- dr eskridge   dx 11/ 2015-- s/p TURBT   History of kidney stones    History of TIA (transient ischemic attack) 12/09/2013   History of urethral stricture    Hyperlipidemia with target LDL less than 70    controlled.   Neuropathy of lower extremity    Nocturia    Osteoporosis    Peripheral neuropathy     lower extremity   Postoperative atrial fibrillation ([place]) 10/2010   after bypass-"shocked back in and no problems since":   per cardiologist note, dr Ellyn Hack, no recurrence   S/P CABG x 2 10/18/2010   LIMA-LAD, SVG to RPDA (Dr. [name])   [place] (transient global amnesia)    "x3 for hours then goes away"   Transient Dilated idiopathic cardiomyopathy April 2015; 04/2014   a) in setting of TIA: EF 35-40% (reduced from 55-65 percent) mild AI, moderate and dilated aorta.;; b) Recheck Echo 04/2014: EF 55-60%, Gr 1 DD, mild-mod AI.     Wears glasses    Wears partial dentures    upper    Past Surgical History:  Procedure Laterality Date   APPENDECTOMY  as teen   BASAL CELL CARCINOMA EXCISION  07/2014   CARDIAC CATHETERIZATION  10/17/2010   80%prox w/mild in-stent restenosis w/75% mid-LADstenosis aft the stent,multi severe stenoses RCAw/heavily calcified vessel,norm LV   CARDIOVERSION  2012   after bypass surgery   CATARACT EXTRACTION W/ INTRAOCULAR LENS  IMPLANT, BILATERAL  2009  approx.   COCCYX REMOVAL     CORONARY ANGIOPLASTY WITH STENT PLACEMENT  1991   PCI and BMS to LAD   CORONARY ARTERY BYPASS GRAFT  10-18-2010  dr Ricard Dillon '@[facility]    LIMA-LAD, SVG-rPDA   CYSTOSCOPY W/ RETROGRADES N/A 07/12/2014   Procedure: CYSTOSCOPY WITH RETROGRADE PYELOGRAM;  Surgeon: [name], MD;  Location: [facility];  Service: Urology;  Laterality: N/A;   CYSTOSCOPY WITH BIOPSY N/A 09/09/2014   Procedure: CYSTO WITH BIOPSY AND FULGERATION, DILATION OF URETHRAL STRICTURE;  Surgeon: [name], MD;  Location: [facility];  Service: Urology;  Laterality: N/A;  BLADDER BIOPSY   CYSTOSCOPY WITH BIOPSY N/A 01/03/2015   Procedure: CYSTOSCOPY WITH BIOPSY;  Surgeon: [name], MD;  Location: [facility];  Service: Urology;  Laterality: N/A;   CYSTOSCOPY WITH BIOPSY N/A 01/26/2019   Procedure: CYSTOSCOPY WITH BIOPSY, BILATERAL RETROGRADE PYELOGRAM;  Surgeon: [name], MD;  Location[facility];  Service:  Urology;  Laterality: N/A;   CYSTOSCOPY WITH FULGERATION N/A 01/26/2019   Procedure: CYSTOSCOPY WITH FULGERATION;  Surgeon: [name], MD;  Location[facility];  Service: Urology;  Laterality: N/A;   CYSTOSCOPY WITH URETHRAL DILATATION N/A 01/03/2015   Procedure: CYSTOSCOPY WITH URETHRAL DILATATION;  Surgeon: [name], MD;  Location: [facility];  Service: Urology;  Laterality: N/A;   DOPPLER ECHOCARDIOGRAPHY  10/18/2004   EF 55-65%,BORDERLINE -enlarged aortic root,mild aortic insuff.,trace tricus.insuff.   FINGER SURGERY Left 03-17-2001   dr Burney Gauze  '@[facility]    repair complex laceration injury 5th digit   KIDNEY STONE SURGERY  1989   NM MYOVIEW LTD  12/16/2013   INTERMEDIATE RISK test with small sized reversible perfusion defect in the apical anterolateral wall --  most likely consistent with known occlusion of D1 with collaterals.   shoulder surgery Right 1988   TONSILLECTOMY  as teen   TRANSTHORACIC ECHOCARDIOGRAM  April 2015   EF 35-40%, mild AI, moderately dilated ascending aorta   TRANSURETHRAL RESECTION OF PROSTATE N/A 07/12/2014   Procedure: TRANSURETHRAL RESECTION OF THE PROSTATE (TURP) WITH MITOMYCIN -C;  Surgeon: [name], MD;  Location: [facility];  Service: Urology;  Laterality: N/A;    Current Medications: Current Meds  Medication Sig   Cholecalciferol (VITAMIN D3) 50 MCG (2000 UT) CAPS Place 5,000 mcg inside cheek daily.   clopidogrel (PLAVIX) 75 MG tablet Take 1 tablet (75 mg total) by mouth daily. Schedule an appointment for further refills   Cyanocobalamin (VITAMIN B12) 1000 MCG TBCR Place 5,000 Int'l Units inside cheek daily.   denosumab (PROLIA) 60 MG/ML SOSY injection Inject 60 mg into the skin every 6 (six) months.   ezetimibe (ZETIA) 10 MG tablet Take 10 mg by mouth daily.   fish oil-omega-3 fatty acids 1000 MG capsule Take 1 g by mouth daily.    gabapentin (NEURONTIN) 100 MG capsule TAKE 1 CAPSULE BY MOUTH TWICE DAILY FOR NEUROPATHY    JARDIANCE 10 MG TABS tablet Take 10 mg by mouth daily.   latanoprost (XALATAN) 0.005 % ophthalmic solution 1 drop at bedtime.   metoprolol succinate (TOPROL-XL) 25 MG 24 hr tablet TAKE 1/2 TABLET BY MOUTH DAILY   Multiple Vitamin (MULTIVITAMIN) capsule Take 1 capsule by mouth at bedtime.    rosuvastatin (CRESTOR) 40 MG tablet TAKE 1 TABLET BY MOUTH DAILY   timolol (TIMOPTIC) 0.5 % ophthalmic solution 1 drop every morning.     Allergies:   Patient has no known allergies.   Social History   Socioeconomic History   Marital status: Married    Spouse name: Mary   Number of children: 0   Years of education: BS, MS   Highest education level: Not on file  Occupational History   Occupation: Retired  Tobacco Use   Smoking status: Former    Packs/day: 1.00    Years: 5.00    Total pack years: 5.00    Types: Cigarettes    Quit date: 09/02/1957    Years since quitting: 64.7   Smokeless tobacco: Never  Vaping Use   Vaping Use: Never used  Substance and Sexual Activity   Alcohol use: Never    Alcohol/week: 0.0 standard drinks of alcohol   Drug use: Never   Sexual activity: Not on file  Other Topics Concern   Not on file  Social History Narrative   Pt lives at home with spouse.   Caffeine Use: very little      He stopped doing the [employer] building about 6 months ago, indicating that he just has not had the same out of energy.  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  He says that now he can only do about 2 and half hours worth of work before you stop and rest.   Social Determinants of Health   Financial Resource Strain: Not on file  Food Insecurity: Not on file  Transportation Needs: Not on file  Physical Activity: Not on file  Stress: Not on file  Social Connections: Not on file     Family History: The patient's family history includes Heart Problems in his father and mother; Parkinson's disease in his father.  ROS:   Please see  the history of present illness.  All other systems reviewed and are negative.  EKGs/Labs/Other Studies Reviewed:    The following studies were reviewed today:  Echo 04/05/2014 LV EF: 55% -   60%  Study Conclusions   - Left ventricle: The cavity size was normal. Wall thickness was    increased in a pattern of mild LVH. Systolic function was normal.    The estimated ejection fraction was in the range of 55% to 60%.    Wall motion was normal; there were no regional wall motion    abnormalities. Doppler parameters are consistent with abnormal    left ventricular relaxation (grade 1 diastolic dysfunction).  - Aortic valve: Trileaflet; moderately calcified leaflets. There    was no stenosis. There was mild to moderate regurgitation.  - Aorta: Mildly dilated aortic root. Aortic root dimension: 39 mm    (ED).  - Mitral valve: Mildly calcified annulus. Mildly calcified leaflets    . There was mild regurgitation.  - Left atrium: The atrium was mildly dilated.  - Right ventricle: The cavity size was normal. Systolic function    was normal.  - Tricuspid valve: Peak RV-RA gradient (S): 23 mm Hg.  - Pulmonary arteries: PA peak pressure: 26 mm Hg (S).  - Inferior vena cava: The vessel was normal in size. The    respirophasic diameter changes were in the normal range (= 50%),    consistent with normal central venous pressure.   Impressions:   - Normal LV size with mild LV hypertrophy. EF is now normal,    55-60%. Normal RV size and systolic function. Mild MR, mild to    moderate aortic insufficiency.    EKG:  EKG is ordered today.  The ekg ordered today demonstrates normal sinus rhythm, no significant ST-T wave changes.  Recent Labs: No results found for requested labs within last 365 days.  Recent Lipid Panel    Component Value Date/Time   CHOL 131 12/10/2013 0428   TRIG 53 03/23/2019 0621   HDL 51 12/10/2013 0428   CHOLHDL 2.6 12/10/2013 0428   VLDL 20 12/10/2013 0428   LDLCALC  60 12/10/2013 0428     Risk Assessment/Calculations:    CHA2DS2-VASc Score = 6   This indicates a 9.7% annual risk of stroke. The patient's score is based upon: CHF History: 0 HTN History: 1 Diabetes History: 0 Stroke History: 2 Vascular Disease History: 1 Age Score: 2 Gender Score: 0          Physical Exam:    VS:  BP 120/66   Pulse 73   Ht '5\' 10"'$  (1.778 m)   Wt 148 lb 12.8 oz (67.5 kg)   SpO2 98%   BMI 21.35 kg/m        Wt Readings from Last 3 Encounters:  05/22/22 148 lb 12.8 oz (67.5 kg)  03/22/22 142 lb (64.4 kg)  06/12/21 145 lb 12.8 oz (66.1 kg)     GEN:  Well nourished, well developed in no acute distress HEENT: Normal NECK: No JVD; No carotid bruits LYMPHATICS: No lymphadenopathy CARDIAC: RRR, no murmurs, rubs, gallops RESPIRATORY:  Clear to auscultation without rales, wheezing or rhonchi  ABDOMEN: Soft, non-tender, non-distended MUSCULOSKELETAL:  No edema; No deformity  SKIN: Warm and dry NEUROLOGIC:  Alert and oriented x 3 PSYCHIATRIC:  Normal affect   ASSESSMENT:    1. Preprocedural examination   2. Coronary artery disease involving coronary bypass graft of native heart without angina pectoris   3. Primary hypertension   4. Hyperlipidemia LDL  goal <70   5. PAF (paroxysmal atrial fibrillation) ([place])   6. TIA (transient ischemic attack)    PLAN:    In order of problems listed above:  Preoperative clearance: Pending transurethral resection of the bladder tumor with right ureteral stent placement by Dr. [name].  He denies any recent significant chest discomfort.  He has been able to walk for 10 minutes to the cafeteria and back without any issue despite his advanced age.  Due to his age of 88 years old and prior cardiac history, he will be a moderate risk patient going for a low risk procedure.  I think he is optimized to proceed with upcoming procedure.  He may hold Plavix for 5 days and a fish oil for 7 days prior to the procedure and  restart as soon as possible afterward at the surgeon's discretion.  CAD s/p CABG: History of two-vessel CABG in 2012.  Last Myoview in 2015 showed a small apical anterolateral defect consistent with known occluded diagonal vessel filled with collaterals, medical therapy was recommended.  Hypertension: Blood pressure stable  Hyperlipidemia: On Crestor, Zetia and fish oil  Postop atrial fibrillation: Solitary episode of atrial fibrillation occurred after bypass surgery.  No recurrence since then.  Not on anticoagulation therapy given lack of recurrence  History of TIA: No recurrence either.         Medication Adjustments/Labs and Tests Ordered: Current medicines are reviewed at length with the patient today.  Concerns regarding medicines are outlined above.  Orders Placed This Encounter  Procedures   EKG 12-Lead   No orders of the defined types were placed in this encounter.   Patient Instructions  Medication Instructions:  HOLD Plavix for 5 days before procedure  HOLD Fish Oil for 7 days before procedure   *If you need a refill on your cardiac medications before your next appointment, please call your pharmacy*  Lab Work: NONE ordered at this time of appointment   If you have labs (blood work) drawn today and your tests are completely normal, you will receive your results only by: [place] (if you have MyChart) OR A paper copy in the mail If you have any lab test that is abnormal or we need to change your treatment, we will call you to review the results.  Testing/Procedures: NONE ordered at this time of appointment   Follow-Up: At [facility], you and your health needs are our priority.  As part of our continuing mission to provide you with exceptional heart care, we have created designated Provider Care Teams.  These Care Teams include your primary Cardiologist (physician) and Advanced Practice Providers (APPs -  Physician Assistants and Nurse  Practitioners) who all work together to provide you with the care you need, when you need it.  We recommend signing up for the patient portal called "MyChart".  Sign up information is provided on this After Visit Summary.  MyChart is used to connect with patients for Virtual Visits (Telemedicine).  Patients are able to view lab/test results, encounter notes, upcoming appointments, etc.  Non-urgent messages can be sent to your provider as well.   To learn more about what you can do with MyChart, go to [email or web address].    Your next appointment:   6 month(s)  The format for your next appointment:   In Person  Provider:   Glenetta Hew, MD     Other Instructions   Important Information About Sugar         Signed, Isaac Laud  [place], [place]  05/24/2022 6:56 PM    [facility]

## 2022-05-24 ENCOUNTER — Encounter: Payer: Self-pay | Admitting: Physician Assistant

## 2022-05-24 NOTE — Telephone Encounter (Signed)
See office note from 05/22/2022, patient was cleared to proceed with surgery.  I have forwarded my note to the surgeon's office.

## 2022-06-03 NOTE — Patient Instructions (Signed)
SURGICAL WAITING ROOM VISITATION Patients having surgery or a procedure may have no more than 2 support people in the waiting area - these visitors may rotate in the visitor waiting room.   Children under the age of 69 must have an adult with them who is not the patient. If the patient needs to stay at the hospital during part of their recovery, the visitor guidelines for inpatient rooms apply.  PRE-OP VISITATION  Pre-op nurse will coordinate an appropriate time for 1 support person to accompany the patient in pre-op.  This support person may not rotate.  This visitor will be contacted when the time is appropriate for the visitor to come back in the pre-op area.  Please refer to the [facility] website for the visitor guidelines for Inpatients (after your surgery is over and you are in a regular room).  You are not required to quarantine at this time prior to your surgery. However, you must do this: Hand Hygiene often Do NOT share personal items Notify your provider if you are in close contact with someone who has COVID or you develop fever 100.4 or greater, new onset of sneezing, cough, sore throat, shortness of breath or body aches.   If you received a COVID test during your pre-op visit  it is requested that you wear a mask when out in public, stay away from anyone that may not be feeling well and notify your surgeon if you develop symptoms. If you test positive for Covid or have been in contact with anyone that has tested positive in the last 10 days please notify you surgeon.       Your procedure is scheduled on:  Tuesday June 11, 2022  Report to [facility] Main Entrance.  Report to admitting at:  07:45   AM  +++++Call this number if you have any questions or problems the morning of surgery [phone]  Do not eat or drink anything after Midnight the night prior to your surgery/procedure.     FOLLOW BOWEL PREP AND ANY ADDITIONAL PRE OP INSTRUCTIONS YOU RECEIVED FROM  YOUR SURGEON'S OFFICE!!!   Oral Hygiene is also important to reduce your risk of infection.        Remember - BRUSH YOUR TEETH THE MORNING OF SURGERY WITH YOUR REGULAR TOOTHPASTE  Stop your PLAVIX x 5 days before your surgery and stop your fish oil 7 day before your surgery.   Jardiance- Do not take this on the day before surgery or the day of surgery.   Take ONLY these medicines the morning of surgery with A SIP OF WATER: Metoprolol  and use your Timolol eye drops.                   You may not have any metal on your body including pins, jewelry, and body piercing  Do not wear lotions, powders,  cologne, or deodorant  Men may shave face and neck.  Contacts, Hearing Aids, dentures or bridgework may not be worn into surgery.   DO NOT [place]. PHARMACY WILL DISPENSE MEDICATIONS LISTED ON YOUR MEDICATION LIST TO YOU DURING YOUR ADMISSION [place]!   Patients discharged on the day of surgery will not be allowed to drive home.  Someone NEEDS to stay with you for the first 24 hours after anesthesia.  Special Instructions: Bring a copy of your healthcare power of attorney and living will documents the day of surgery, if you wish to  have them scanned into your [facility] Records- EPIC  Please read over the following fact sheets you were given: IF YOU HAVE QUESTIONS ABOUT YOUR PRE-OP INSTRUCTIONS, PLEASE CALL [phone]  ([place])   [place] - Preparing for Surgery Before surgery, you can play an important role.  Because skin is not sterile, your skin needs to be as free of germs as possible.  You can reduce the number of germs on your skin by washing with CHG (chlorahexidine gluconate) soap before surgery.  CHG is an antiseptic cleaner which kills germs and bonds with the skin to continue killing germs even after washing. Please DO NOT use if you have an allergy to CHG or antibacterial soaps.  If your skin becomes reddened/irritated stop  using the CHG and inform your nurse when you arrive at Short Stay. Do not shave (including legs and underarms) for at least 48 hours prior to the first CHG shower.  You may shave your face/neck.  Please follow these instructions carefully:  1.  Shower with CHG Soap the night before surgery and the  morning of surgery.  2.  If you choose to wash your hair, wash your hair first as usual with your normal  shampoo.  3.  After you shampoo, rinse your hair and body thoroughly to remove the shampoo.                             4.  Use CHG as you would any other liquid soap.  You can apply chg directly to the skin and wash.  Gently with a scrungie or clean washcloth.  5.  Apply the CHG Soap to your body ONLY FROM THE NECK DOWN.   Do not use on face/ open                           Wound or open sores. Avoid contact with eyes, ears mouth and genitals (private parts).                       Wash face,  Genitals (private parts) with your normal soap.             6.  Wash thoroughly, paying special attention to the area where your  surgery  will be performed.  7.  Thoroughly rinse your body with warm water from the neck down.  8.  DO NOT shower/wash with your normal soap after using and rinsing off the CHG Soap.            9.  Pat yourself dry with a clean towel.            10.  Wear clean pajamas.            11.  Place clean sheets on your bed the night of your first shower and do not  sleep with pets.  ON THE DAY OF SURGERY : Do not apply any lotions/deodorants the morning of surgery.  Please wear clean clothes to the hospital/surgery center.    FAILURE TO FOLLOW THESE INSTRUCTIONS MAY RESULT IN THE CANCELLATION OF YOUR SURGERY  PATIENT SIGNATURE_________________________________  NURSE SIGNATURE__________________________________  ________________________________________________________________________

## 2022-06-03 NOTE — Progress Notes (Signed)
COVID Vaccine received:  '[]'$  No '[x]'$  Yes Date of any COVID positive Test in last 90 days: none  PCP - Deland Pretty, MD  Cardiologist - Glenetta Hew, MD Cardiac clearance - 05-22-22  Almyra Deforest, [place]  Chest x-ray - 03-23-2019  Epic EKG - 05-22-2022 Epic  Stress Test - 12-16-2013  Epic ECHO - 04-05-2014  Epic Cardiac Cath - 10-17-2010  Dr. Ellyn Hack     Pacemaker/ICD device     '[x]'$  N/A Spinal Cord Stimulator:'[x]'$  No '[]'$  Yes      (Remind patient to bring remote DOS) Other Implants:   Bowel Prep - no prep per patient and his nephew (POA)  History of Sleep Apnea? '[x]'$  No '[]'$  Yes   Sleep Study Date:   CPAP used?- '[x]'$  No '[]'$  Yes  (Instruct to bring their mask & Tubing)  Does the patient monitor blood sugar? '[]'$  No '[x]'$  Yes  '[]'$  N/A Does patient have a [employer] or Dexacom? '[]'$  No '[]'$  Yes   Fasting Blood Sugar Ranges-  Checks Blood Sugar __1 times a week  Blood Thinner Instructions: Plavix   Hold x 5 days, per Almyra Deforest, [place]  05-22-22 note Aspirin Instructions: none Last Dose:06-05-2022  ERAS Protocol Ordered: '[x]'$  No  '[]'$  Yes PRE-SURGERY '[]'$  ENSURE  '[]'$  G2   Comments: Patient has a history of MRSA, so a PCR screen was ordered at PST appt.   Activity level: Patient can not climb a flight of stairs without difficulty; He is limited on his walking d/t arthritis and he would have _SOB. He is able to perform is ADLs. Mr. Jowett lives at [facility] And is surprisingly spry and is a good historian.  He is HOH and wears hearing aids in both ears. His Nephew, Scarlette Calico, ([place]), will be with patient on DOS.   Anesthesia review: CABG x2 (Dr. Ricard Dillon 2012) had postop A.fib- cardioversion. Hx TIA, Cath 10-2010 - BMS, HTN, Pre-DM   Patient denies shortness of breath, fever, cough and chest pain at PAT appointment.  Patient verbalized understanding and agreement to the Pre-Surgical Instructions that were given to them at this PAT appointment. Patient was also educated of the need to review these PAT  instructions again prior to his/her surgery.I reviewed the appropriate phone numbers to call if they have any and questions or concerns.

## 2022-06-05 ENCOUNTER — Encounter (HOSPITAL_COMMUNITY)
Admission: RE | Admit: 2022-06-05 | Discharge: 2022-06-05 | Disposition: A | Discharge status: Home / Self Care | Payer: Medicare Other | Source: Ambulatory Visit | Attending: Urology | Admitting: Urology

## 2022-06-05 ENCOUNTER — Encounter (HOSPITAL_COMMUNITY): Payer: Self-pay

## 2022-06-05 ENCOUNTER — Other Ambulatory Visit: Payer: Self-pay

## 2022-06-05 VITALS — HR 72 | Temp 97.7°F | Resp 16 | Ht 70.0 in | Wt 148.8 lb

## 2022-06-05 DIAGNOSIS — I10 Essential (primary) hypertension: Secondary | ICD-10-CM | POA: Diagnosis not present

## 2022-06-05 DIAGNOSIS — R7303 Prediabetes: Secondary | ICD-10-CM | POA: Diagnosis not present

## 2022-06-05 DIAGNOSIS — Z01812 Encounter for preprocedural laboratory examination: Secondary | ICD-10-CM | POA: Insufficient documentation

## 2022-06-05 DIAGNOSIS — Z8614 Personal history of Methicillin resistant Staphylococcus aureus infection: Secondary | ICD-10-CM | POA: Diagnosis not present

## 2022-06-05 HISTORY — DX: Prediabetes: R73.03

## 2022-06-05 LAB — BASIC METABOLIC PANEL
Anion gap: 5 (ref 5–15)
BUN: 30 mg/dL — ABNORMAL HIGH (ref 8–23)
CO2: 25 mmol/L (ref 22–32)
Calcium: 9.7 mg/dL (ref 8.9–10.3)
Chloride: 110 mmol/L (ref 98–111)
Creatinine, Ser: 0.96 mg/dL (ref 0.61–1.24)
GFR, Estimated: >60 mL/min (ref >60)
Glucose, Bld: 137 mg/dL — ABNORMAL HIGH (ref 70–99)
Potassium: 4 mmol/L (ref 3.5–5.1)
Sodium: 140 mmol/L (ref 135–145)

## 2022-06-05 LAB — GLUCOSE, CAPILLARY: Glucose-Capillary: 134 mg/dL — ABNORMAL HIGH (ref 70–99)

## 2022-06-05 LAB — CBC
HCT: 41.8 % (ref 39.0–52.0)
Hemoglobin: 13.6 g/dL (ref 13.0–17.0)
MCH: 32.2 pg (ref 26.0–34.0)
MCHC: 32.5 g/dL (ref 30.0–36.0)
MCV: 99.1 fL (ref 80.0–100.0)
Platelets: 144 10*3/uL — ABNORMAL LOW (ref 150–400)
RBC: 4.22 MIL/uL (ref 4.22–5.81)
RDW: 12.4 % (ref 11.5–15.5)
WBC: 5.2 10*3/uL (ref 4.0–10.5)
nRBC: 0 % (ref 0.0–0.2)

## 2022-06-05 LAB — SURGICAL PCR SCREEN
MRSA, PCR: NEGATIVE
Staphylococcus aureus: NEGATIVE

## 2022-06-05 LAB — HEMOGLOBIN A1C
Hgb A1c MFr Bld: 6.3 % — ABNORMAL HIGH (ref 4.8–5.6)
Mean Plasma Glucose: 134.11 mg/dL

## 2022-06-06 NOTE — Progress Notes (Signed)
Anesthesia Chart Review   Case: [id number] Date/Time: 06/11/22 0945   Procedures:      TRANSURETHRAL RESECTION OF BLADDER TUMOR (TURBT) RIGHT URETERAL STENT (Right)     TRANSURETHRAL RESECTION OF THE PROSTATE (TURP) - REQUESTING 2 HRS   Anesthesia type: General   Pre-op diagnosis: BLADDER CANCER   Location: [facility]   Surgeons: [name], MD       DISCUSSION:88 y.o. former smoker with h/o CAD (CABG 2012), post op atrial fibrillation without recurrence, CHF, bladder cancer scheduled for above procedure 06/11/2022 with Dr. [name].   Pt last seen by cardiology 05/22/2022. Per OV note, "Preoperative clearance: Pending transurethral resection of the bladder tumor with right ureteral stent placement by Dr. [name].  He denies any recent significant chest discomfort.  He has been able to walk for 10 minutes to the cafeteria and back without any issue despite his advanced age.  Due to his age of 88 years old and prior cardiac history, he will be a moderate risk patient going for a low risk procedure.  I think he is optimized to proceed with upcoming procedure.  He may hold Plavix for 5 days and a fish oil for 7 days prior to the procedure and restart as soon as possible afterward at the surgeon's discretion."  Anticipate pt can proceed with planned procedure barring acute status change.   VS: Pulse 72   Temp 36.5 C (Oral)   Resp 16   Ht '5\' 10"'$  (1.778 m)   Wt 67.5 kg   BMI 21.35 kg/m   PROVIDERS: Deland Pretty, MD is PCP   Cardiologist - Glenetta Hew, MD LABS: Labs reviewed: Acceptable for surgery. (all labs ordered are listed, but only abnormal results are displayed)  Labs Reviewed  HEMOGLOBIN A1C - Abnormal; Notable for the following components:      Result Value   Hgb A1c MFr Bld 6.3 (*)    All other components within normal limits  BASIC METABOLIC PANEL - Abnormal; Notable for the following components:   Glucose, Bld 137 (*)    BUN 30 (*)     All other components within normal limits  CBC - Abnormal; Notable for the following components:   Platelets 144 (*)    All other components within normal limits  GLUCOSE, CAPILLARY - Abnormal; Notable for the following components:   Glucose-Capillary 134 (*)    All other components within normal limits  SURGICAL PCR SCREEN     IMAGES:   EKG:   CV: Echo 04/05/2014 - Left ventricle: The cavity size was normal. Wall thickness was    increased in a pattern of mild LVH. Systolic function was normal.    The estimated ejection fraction was in the range of 55% to 60%.    Wall motion was normal; there were no regional wall motion    abnormalities. Doppler parameters are consistent with abnormal    left ventricular relaxation (grade 1 diastolic dysfunction).  - Aortic valve: Trileaflet; moderately calcified leaflets. There    was no stenosis. There was mild to moderate regurgitation.  - Aorta: Mildly dilated aortic root. Aortic root dimension: 39 mm    (ED).  - Mitral valve: Mildly calcified annulus. Mildly calcified leaflets    . There was mild regurgitation.  - Left atrium: The atrium was mildly dilated.  - Right ventricle: The cavity size was normal. Systolic function    was normal.  - Tricuspid valve: Peak RV-RA gradient (S): 23 mm Hg.  -  Pulmonary arteries: PA peak pressure: 26 mm Hg (S).  - Inferior vena cava: The vessel was normal in size. The    respirophasic diameter changes were in the normal range (= 50%),    consistent with normal central venous pressure.  Past Medical History:  Diagnosis Date   Anticoagulant long-term use    plavix  (for hx TIA)   Arthritis    right shoulder    Benign essential tremor    hands -- occasional   CAD S/P percutaneous coronary angioplasty cardiologist-- dr harding   a) PCI and BMS to LAD in 1991; b) CATH 10/2010: Severe 2 Vessel disease = diffuse RCA ( 75% prox, 90% mid & 70 + 75% distal) & LAD (80-90% pre-stent, 75% post-stent) with  occluded D1 (filled via OM-D1 collaterals)  -- CABG x 2; c) Myoview 4/'15: INTERMEDIATE RISK - small sized reversible apical/anterolateral defect -- most likely c/w known occlusion of D1 with collaterals.(med Rx)   History of basal cell carcinoma (BCC) excision    History of bladder cancer urologist-- dr eskridge   dx 11/ 2015-- s/p TURBT   History of kidney stones    History of TIA (transient ischemic attack) 12/09/2013   History of urethral stricture    Hyperlipidemia with target LDL less than 70    controlled.   Neuropathy of lower extremity    Nocturia    Osteoporosis    Peripheral neuropathy    lower extremity   Postoperative atrial fibrillation ([place]) 10/2010   after bypass-"shocked back in and no problems since":   per cardiologist note, dr Ellyn Hack, no recurrence   Pre-diabetes    S/P CABG x 2 10/18/2010   LIMA-LAD, SVG to RPDA (Dr. [name])   [place] (transient global amnesia)    "x3 for hours then goes away"   Transient Dilated idiopathic cardiomyopathy April 2015; 04/2014   a) in setting of TIA: EF 35-40% (reduced from 55-65 percent) mild AI, moderate and dilated aorta.;; b) Recheck Echo 04/2014: EF 55-60%, Gr 1 DD, mild-mod AI.     Wears glasses    Wears partial dentures    upper    Past Surgical History:  Procedure Laterality Date   APPENDECTOMY  as teen   BASAL CELL CARCINOMA EXCISION  07/2014   CARDIAC CATHETERIZATION  10/17/2010   80%prox w/mild in-stent restenosis w/75% mid-LADstenosis aft the stent,multi severe stenoses RCAw/heavily calcified vessel,norm LV   CARDIOVERSION  2012   after bypass surgery   CATARACT EXTRACTION W/ INTRAOCULAR LENS  IMPLANT, BILATERAL  2009  approx.   COCCYX REMOVAL     CORONARY ANGIOPLASTY WITH STENT PLACEMENT  1991   PCI and BMS to LAD   CORONARY ARTERY BYPASS GRAFT  10-18-2010  dr Ricard Dillon '@[facility]'$    LIMA-LAD, SVG-rPDA   CYSTOSCOPY W/ RETROGRADES N/A 07/12/2014   Procedure: CYSTOSCOPY WITH RETROGRADE PYELOGRAM;  Surgeon: [name], MD;   Location: [facility];  Service: Urology;  Laterality: N/A;   CYSTOSCOPY WITH BIOPSY N/A 09/09/2014   Procedure: CYSTO WITH BIOPSY AND FULGERATION, DILATION OF URETHRAL STRICTURE;  Surgeon: [name], MD;  Location: [facility];  Service: Urology;  Laterality: N/A;  BLADDER BIOPSY   CYSTOSCOPY WITH BIOPSY N/A 01/03/2015   Procedure: CYSTOSCOPY WITH BIOPSY;  Surgeon: [name], MD;  Location: [facility];  Service: Urology;  Laterality: N/A;   CYSTOSCOPY WITH BIOPSY N/A 01/26/2019   Procedure: CYSTOSCOPY WITH BIOPSY, BILATERAL RETROGRADE PYELOGRAM;  Surgeon: [name], MD;  Location: [facility];  Service: Urology;  Laterality: N/A;   CYSTOSCOPY WITH FULGERATION N/A 01/26/2019   Procedure: CYSTOSCOPY WITH FULGERATION;  Surgeon: [name], MD;  Location: [facility];  Service: Urology;  Laterality: N/A;   CYSTOSCOPY WITH URETHRAL DILATATION N/A 01/03/2015   Procedure: CYSTOSCOPY WITH URETHRAL DILATATION;  Surgeon: [name], MD;  Location: [facility];  Service: Urology;  Laterality: N/A;   DOPPLER ECHOCARDIOGRAPHY  10/18/2004   EF 55-65%,BORDERLINE -enlarged aortic root,mild aortic insuff.,trace tricus.insuff.   FINGER SURGERY Left 03-17-2001   dr Burney Gauze  '@[facility]'$    repair complex laceration injury 5th digit   KIDNEY STONE SURGERY  1989   NM MYOVIEW LTD  12/16/2013   INTERMEDIATE RISK test with small sized reversible perfusion defect in the apical anterolateral wall -- most likely consistent with known occlusion of D1 with collaterals.   shoulder surgery Right 1988   TONSILLECTOMY  as teen   TRANSTHORACIC ECHOCARDIOGRAM  April 2015   EF 35-40%, mild AI, moderately dilated ascending aorta   TRANSURETHRAL RESECTION OF PROSTATE N/A 07/12/2014   Procedure: TRANSURETHRAL RESECTION OF THE PROSTATE (TURP) WITH MITOMYCIN -C;  Surgeon: [name], MD;  Location: [facility];  Service: Urology;  Laterality: N/A;    MEDICATIONS:  acetaminophen (TYLENOL) 500 MG  tablet   Cholecalciferol (VITAMIN D3) 50 MCG (2000 UT) CAPS   clopidogrel (PLAVIX) 75 MG tablet   Cyanocobalamin (VITAMIN B12 PO)   denosumab (PROLIA) 60 MG/ML SOSY injection   ezetimibe (ZETIA) 10 MG tablet   fish oil-omega-3 fatty acids 1000 MG capsule   gabapentin (NEURONTIN) 100 MG capsule   JARDIANCE 10 MG TABS tablet   latanoprost (XALATAN) 0.005 % ophthalmic solution   metoprolol succinate (TOPROL-XL) 25 MG 24 hr tablet   Multiple Vitamin (MULTIVITAMIN) capsule   rosuvastatin (CRESTOR) 40 MG tablet   timolol (TIMOPTIC) 0.5 % ophthalmic solution   No current facility-administered medications for this encounter.    gemcitabine (GEMZAR) chemo syringe for bladder instillation 2,000 mg    Konrad Felix Ward, PA-C [facility] Pre-Surgical Testing [phone]

## 2022-06-06 NOTE — Anesthesia Preprocedure Evaluation (Addendum)
Anesthesia Evaluation  Patient identified by MRN, date of birth, ID band Patient awake    Reviewed: Allergy & Precautions, NPO status , Patient's Chart, lab work & pertinent test results  Airway Mallampati: II  TM Distance: >3 FB Neck ROM: Full    Dental  (+) Partial Lower   Pulmonary neg pulmonary ROS, former smoker,    Pulmonary exam normal        Cardiovascular hypertension, Pt. on medications and Pt. on home beta blockers + CAD, + Cardiac Stents, + CABG (2012) and +CHF  + dysrhythmias Atrial Fibrillation  Rhythm:Irregular Rate:Normal  Echo 04/05/2014 - Left ventricle: The cavity size was normal. Wall thickness was  increased in a pattern of mild LVH. Systolic function was normal.  The estimated ejection fraction was in the range of 55% to 60%.  Wall motion was normal; there were no regional wall motion  abnormalities. Doppler parameters are consistent with abnormal  left ventricular relaxation (grade 1 diastolic dysfunction).  - Aortic valve: Trileaflet; moderately calcified leaflets. There  was no stenosis. There was mild to moderate regurgitation.  - Aorta: Mildly dilated aortic root. Aortic root dimension: 39 mm  (ED).  - Mitral valve: Mildly calcified annulus. Mildly calcified leaflets  . There was mild regurgitation.  - Left atrium: The atrium was mildly dilated.  - Right ventricle: The cavity size was normal. Systolic function  was normal.  - Tricuspid valve: Peak RV-RA gradient (S): 23 mm Hg.  - Pulmonary arteries: PA peak pressure: 26 mm Hg (S).  - Inferior vena cava: The vessel was normal in size. The  respirophasic diameter changes were in the normal range (= 50%),  consistent with normal central venous pressure.    Neuro/Psych TIAnegative psych ROS   GI/Hepatic negative GI ROS, Neg liver ROS,   Endo/Other  negative endocrine ROS  Renal/GU negative Renal ROS Bladder  dysfunction  Bladder Ca    Musculoskeletal  (+) Arthritis , Osteoarthritis,    Abdominal Normal abdominal exam  (+)   Peds  Hematology negative hematology ROS (+)   Anesthesia Other Findings   Reproductive/Obstetrics                           Anesthesia Physical Anesthesia Plan  ASA: 3  Anesthesia Plan: General   Post-op Pain Management:    Induction: Intravenous  PONV Risk Score and Plan: 2 and Ondansetron, Dexamethasone and Treatment may vary due to age or medical condition  Airway Management Planned: Mask and LMA  Additional Equipment: None  Intra-op Plan:   Post-operative Plan: Extubation in OR  Informed Consent: I have reviewed the patients History and Physical, chart, labs and discussed the procedure including the risks, benefits and alternatives for the proposed anesthesia with the patient or authorized representative who has indicated his/her understanding and acceptance.     Dental advisory given  Plan Discussed with: CRNA  Anesthesia Plan Comments: (See PAT note 06/05/2022  Lab Results      Component                Value               Date                      WBC                      5.2  06/05/2022                HGB                      13.6                06/05/2022                HCT                      41.8                06/05/2022                MCV                      99.1                06/05/2022                PLT                      144 (L)             06/05/2022           Lab Results      Component                Value               Date                      NA                       140                 06/05/2022                K                        4.0                 06/05/2022                CO2                      25                  06/05/2022                GLUCOSE                  137 (H)             06/05/2022                BUN                      30 (H)              06/05/2022                 CREATININE               0.96                06/05/2022  CALCIUM                  9.7                 06/05/2022                GFRNONAA                 >60                 06/05/2022          )       Anesthesia Quick Evaluation

## 2022-06-10 NOTE — H&P (Signed)
Office Visit Report     05/16/2022   --------------------------------------------------------------------------------   Tanner [name]  MRN: [id number]  DOB: 12/10/1923, 88 year old Male  SSN: [id number]   PRIMARY CARE:  Thressa Sheller, MD  REFERRING:  Georgette Dover, MD  PROVIDER:  [name], M.D.  LOCATION:  [facility] [phone]     --------------------------------------------------------------------------------   CC/HPI: F/u -   1) h/o bladder cancer - h/o HG Ta and focal T1 disease in 2015. Recurrence 2020.   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 No muscle. Presented with MH. BUN was 17, creatinine 0.95. Repeat cystoscopy, dilation of urethral stricture, exam under anesthesia, bladder biopsy  -Jan 2016 - no residual tumor. Biopsy tumor site and base - negative, muscle present.  -May 2016 - HG Ta recurrence at dome (given [facility])  -May 2020 - HGTa and CIS, gemcitabine, bilateral RGP; right bladder wall   Staging:  Sep 2023 - CT a/p - right papillary tumors at Rt UVJ with dilation and enhancement of right distal ureter, asymmetric thickening left anterior bladder wall. No LAD or bone lesion.   BCG:  Jul 2016 completed induction x 06 Nov 2015 BCG x 3 (pt did not f/u as planned)  Jul 2020 3 / 6 (dev. UTI and bacteremia)   He has a history of stricture at the fossa navicularis and at the bulb. PSA 3.08 Jun 2017.   Today, seen in management of the above. He had en episode of gros hematuria that cleared. He has lost some weight. No pain. No straining to void but a slow / small stream. CT Sep as above with bladder recurrence, dilation/enhancement right distal ureter and no mets. Aug 2023 Cr 0.9. Cysto today with papillary tumor along prostatic urethra and BN. Right bladder wall. Nodular tumor left anterior bladder wall. UOs clear of tumor but near rt UO.   He talkes clopidogrel.   Call Scarlette Calico for  issues POA -     ALLERGIES: No Allergies    MEDICATIONS: Aleve 220 mg tablet Oral  Atorvastatin Calcium 80 MG Oral Tablet 0 Oral Daily  Calcium Citrate- Vitamin D 315 mg calcium-6.25 mcg (250 unit) tablet Oral  Fish Oil CAPS Oral  Fosamax  Gabapentin  Jardiance 10 mg tablet  Metoprolol Tartrate 75 mg tablet Oral  Monupiravir  Multivitamins tablet Oral  Plavix 75 mg tablet Oral  Prolia     GU PSH: Bladder Instill AntiCA Agent - 2020, 2020, 2020, 2020, 2016, 2015 Cysto Dilate Stricture (M or F) - 2016 Cysto Fulgurate < 0.5 cm - 2016, 2016 Cystoscopy - 2021, 2021, 2020, 2020, 2019, 2019, 2018, 2018 Cystoscopy TURBT 2-5 cm - 2020, 2015 Locm 300-'399Mg'$ /Ml Iodine,1Ml - 05/14/2022       PSH Notes: Bladder Injection Of Cancer Treatment, Cystoscopy With Fulguration Minor Lesion (Under 62m), Cystoscopy With Fulguration Minor Lesion (Under 544m, Cystoscopy For Urethral Stricture, Bladder Injection Of Cancer Treatment, Cystoscopy With Fulguration Medium Lesion (2-5cm), Kidney Surgery   NON-GU PSH: No Non-GU PSH    GU PMH: Bladder Cancer Dome - 05/14/2022, Malignant neoplasm of dome of bladder, - 2016 History of bladder cancer, f/u for cystoscopy - 05/02/2022, cysto looked good today and only mild sx of fossa and bulb. Scope easily passed. Looks good. , - 2021 Microscopic hematuria (Stable), check CT scan to eval kidneys and bladder. 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      PMH  Notes: neuropathy   NON-GU PMH: Encounter for general adult medical examination without abnormal findings, Encounter for preventive health examination - 1960 Neoplasm of uncertain behavior of skin, Basal cell tumor - 2013-11-25 Personal history of other diseases of the musculoskeletal system and connective tissue, History of arthritis - 11/25/13 Personal history of other endocrine, nutritional and metabolic disease, History of hypercholesterolemia - 25-Nov-2013    FAMILY HISTORY: Death - Runs In Family No Significant Family History - Runs In Family   SOCIAL HISTORY: Marital Status: Married Preferred Language: English; Ethnicity: Not Hispanic Or Latino; Race: White     Notes: Never a smoker, Retired, Caffeine use, Married, Alcohol use   REVIEW OF SYSTEMS:    GU Review Male:   Patient denies frequent urination, hard to postpone urination, burning/ pain with urination, get up at night to urinate, leakage of urine, stream starts and stops, trouble starting your stream, have to strain to urinate , erection problems, and penile pain.  Gastrointestinal (Upper):   Patient denies nausea, vomiting, and indigestion/ heartburn.  Gastrointestinal (Lower):   Patient denies diarrhea and constipation.  Constitutional:   Patient denies fever, night sweats, weight loss, and fatigue.  Skin:   Patient denies skin rash/ lesion and itching.  Eyes:   Patient denies blurred vision and double vision.  Ears/ Nose/ Throat:   Patient denies sore throat and sinus problems.  Hematologic/Lymphatic:   Patient denies swollen glands and easy bruising.  Cardiovascular:   Patient denies leg swelling and chest pains.  Respiratory:   Patient denies cough and shortness of breath.  Endocrine:   Patient denies excessive thirst.  Musculoskeletal:   Patient denies back pain and joint pain.  Neurological:   Patient denies headaches and dizziness.  Psychologic:   Patient denies depression and anxiety.   VITAL SIGNS: None   GU PHYSICAL  EXAMINATION:    Scrotum: No lesions. No edema. No cysts. No warts.  Urethral Meatus: Normal size. No lesion, no wart, no discharge, no polyp. Normal location.  Penis: Circumcised, no warts, no cracks. No dorsal Peyronie's plaques, no left corporal Peyronie's plaques, no right corporal Peyronie's plaques, no scarring, no warts. No balanitis, no meatal stenosis.   MULTI-SYSTEM PHYSICAL EXAMINATION:    Constitutional: Well-nourished. No physical deformities. Normally developed. Good grooming.  Neck: Neck symmetrical, not swollen. Normal tracheal position.  Respiratory: No labored breathing, no use of accessory muscles.   Cardiovascular: Normal temperature, normal extremity pulses, no swelling, no varicosities.  Skin: No paleness, no jaundice, no cyanosis. No lesion, no ulcer, no rash.  Neurologic / Psychiatric: Oriented to time, oriented to place, oriented to person. No depression, no anxiety, no agitation.  Gastrointestinal: No mass, no tenderness, no rigidity, non obese abdomen.     Complexity of Data:  X-Ray Review: C.T. Abdomen/Pelvis: Reviewed Films. Discussed With Patient. and jack November 25, 2021     PROCEDURES:         Flexible Cystoscopy - 52000  Risks, benefits, and some of the potential complications of the procedure were discussed with the patient. All questions were  answered. Informed consent was obtained. Antibiotic prophylaxis was given -- Cephalexin. Sterile technique and intraurethral analgesia were used.  Meatus:  Normal size. Normal location. Normal condition.  Urethra:  No strictures.  External Sphincter:  Normal.  Verumontanum:  Normal.  Prostate:  Non-obstructing. No hyperplasia.  Bladder Neck:  Non-obstructing.  Ureteral Orifices:  Normal location. Normal size. Normal shape. Effluxed clear urine.  Bladder:  No trabeculation. No tumors. Normal mucosa. No stones. papillary tumor along prostatic urethra and BN. Right bladder wall. Nodular tumor left anterior bladder wall. UOs  clear of tumor but near rt UO.       The lower urinary tract was carefully examined. The procedure was well-tolerated and without complications. Antibiotic instructions were given. Instructions were given to call the office immediately for bloody urine, difficulty urinating, painful urination, fever, chills, nausea, vomiting or other illness. The patient stated that he understood these instructions and would comply with them.         Urinalysis w/Scope Dipstick Dipstick Cont'd Micro  Color: Yellow Bilirubin: Neg mg/dL WBC/hpf: 6 - 10/hpf  Appearance: Slightly Cloudy Ketones: Trace mg/dL RBC/hpf: 0 - 2/hpf  Specific Gravity: 1.015 Blood: 2+ ery/uL Bacteria: NS (Not Seen)  pH: <=5.0 Protein: Neg mg/dL Cystals: NS (Not Seen)  Glucose: 3+ mg/dL Urobilinogen: 0.2 mg/dL Casts: NS (Not Seen)    Nitrites: Neg Trichomonas: Not Present    Leukocyte Esterase: Trace leu/uL Mucous: Not Present      Epithelial Cells: NS (Not Seen)      Yeast: NS (Not Seen)      Sperm: Not Present    ASSESSMENT:      ICD-10 Details  1 GU:   Bladder Cancer Dome - C67.1 Chronic, Stable - disc with patient and Tanner [name] the nature r/b/a to TURBT/TURP poss right stent. All questions answered.    PLAN:           Orders Labs Urine Culture          Document Letter(s):  Created for Patient: Clinical Summary    * Signed by [name], M.D. on 05/16/22 at 4:38 PM (EDT)*      The information contained in this medical record document is considered private and confidential patient information. This information can only be used for the medical diagnosis and/or medical services that are being provided by the patient's selected caregivers. This information can only be distributed outside of the patient's care if the patient agrees and signs waivers of authorization for this information to be sent to an outside source or route.  Add; Urine cx was negative

## 2022-06-11 ENCOUNTER — Encounter (HOSPITAL_COMMUNITY)
Admission: RE | Disposition: A | Discharge status: Home / Self Care | Payer: Self-pay | Source: Home / Self Care | Attending: Urology

## 2022-06-11 ENCOUNTER — Ambulatory Visit (HOSPITAL_COMMUNITY): Payer: Medicare Other | Admitting: Physician Assistant

## 2022-06-11 ENCOUNTER — Encounter (HOSPITAL_COMMUNITY): Payer: Self-pay | Admitting: Urology

## 2022-06-11 ENCOUNTER — Ambulatory Visit (HOSPITAL_BASED_OUTPATIENT_CLINIC_OR_DEPARTMENT_OTHER): Payer: Medicare Other | Admitting: Certified Registered"

## 2022-06-11 ENCOUNTER — Ambulatory Visit (HOSPITAL_COMMUNITY): Payer: Medicare Other

## 2022-06-11 ENCOUNTER — Ambulatory Visit (HOSPITAL_COMMUNITY)
Admission: RE | Admit: 2022-06-11 | Discharge: 2022-06-11 | Disposition: A | Discharge status: Home / Self Care | Payer: Medicare Other | Attending: Urology | Admitting: Urology

## 2022-06-11 ENCOUNTER — Other Ambulatory Visit: Payer: Self-pay

## 2022-06-11 DIAGNOSIS — C679 Malignant neoplasm of bladder, unspecified: Secondary | ICD-10-CM | POA: Diagnosis not present

## 2022-06-11 DIAGNOSIS — Z87891 Personal history of nicotine dependence: Secondary | ICD-10-CM | POA: Diagnosis not present

## 2022-06-11 DIAGNOSIS — C7982 Secondary malignant neoplasm of genital organs: Secondary | ICD-10-CM | POA: Insufficient documentation

## 2022-06-11 DIAGNOSIS — C678 Malignant neoplasm of overlapping sites of bladder: Secondary | ICD-10-CM | POA: Insufficient documentation

## 2022-06-11 DIAGNOSIS — I2510 Atherosclerotic heart disease of native coronary artery without angina pectoris: Secondary | ICD-10-CM

## 2022-06-11 DIAGNOSIS — I4891 Unspecified atrial fibrillation: Secondary | ICD-10-CM | POA: Diagnosis not present

## 2022-06-11 DIAGNOSIS — I110 Hypertensive heart disease with heart failure: Secondary | ICD-10-CM | POA: Insufficient documentation

## 2022-06-11 DIAGNOSIS — I083 Combined rheumatic disorders of mitral, aortic and tricuspid valves: Secondary | ICD-10-CM | POA: Diagnosis not present

## 2022-06-11 DIAGNOSIS — Z955 Presence of coronary angioplasty implant and graft: Secondary | ICD-10-CM | POA: Diagnosis not present

## 2022-06-11 DIAGNOSIS — M1990 Unspecified osteoarthritis, unspecified site: Secondary | ICD-10-CM | POA: Diagnosis not present

## 2022-06-11 DIAGNOSIS — I509 Heart failure, unspecified: Secondary | ICD-10-CM | POA: Insufficient documentation

## 2022-06-11 DIAGNOSIS — Z79899 Other long term (current) drug therapy: Secondary | ICD-10-CM | POA: Insufficient documentation

## 2022-06-11 DIAGNOSIS — R7303 Prediabetes: Secondary | ICD-10-CM

## 2022-06-11 DIAGNOSIS — Z951 Presence of aortocoronary bypass graft: Secondary | ICD-10-CM | POA: Diagnosis not present

## 2022-06-11 HISTORY — PX: TRANSURETHRAL RESECTION OF PROSTATE: SHX73

## 2022-06-11 HISTORY — PX: TRANSURETHRAL RESECTION OF BLADDER TUMOR: SHX2575

## 2022-06-11 LAB — GLUCOSE, CAPILLARY: Glucose-Capillary: 122 mg/dL — ABNORMAL HIGH (ref 70–99)

## 2022-06-11 SURGERY — TURBT (TRANSURETHRAL RESECTION OF BLADDER TUMOR)
Anesthesia: General | Laterality: Right

## 2022-06-11 MED ORDER — DEXAMETHASONE SODIUM PHOSPHATE 10 MG/ML IJ SOLN
INTRAMUSCULAR | Status: DC | PRN
  Administered 2022-06-11: 8 mg via INTRAVENOUS

## 2022-06-11 MED ORDER — FENTANYL CITRATE PF 50 MCG/ML IJ SOSY
PREFILLED_SYRINGE | INTRAMUSCULAR | Status: AC
  Administered 2022-06-11: 25 ug via INTRAVENOUS
  Filled 2022-06-11: qty 2

## 2022-06-11 MED ORDER — FENTANYL CITRATE (PF) 100 MCG/2ML IJ SOLN
INTRAMUSCULAR | Status: AC
  Filled 2022-06-11: qty 2

## 2022-06-11 MED ORDER — LIDOCAINE HCL (CARDIAC) PF 100 MG/5ML IV SOSY
PREFILLED_SYRINGE | INTRAVENOUS | Status: DC | PRN
  Administered 2022-06-11: 40 mg via INTRATRACHEAL

## 2022-06-11 MED ORDER — ONDANSETRON HCL 4 MG/2ML IJ SOLN
INTRAMUSCULAR | Status: DC | PRN
  Administered 2022-06-11: 4 mg via INTRAVENOUS

## 2022-06-11 MED ORDER — CEFAZOLIN SODIUM-DEXTROSE 2-4 GM/100ML-% IV SOLN
2.0000 g | INTRAVENOUS | Status: AC
  Administered 2022-06-11: 2 g via INTRAVENOUS
  Filled 2022-06-11: qty 100

## 2022-06-11 MED ORDER — LIDOCAINE HCL URETHRAL/MUCOSAL 2 % EX GEL
CUTANEOUS | Status: DC | PRN
  Administered 2022-06-11: 1

## 2022-06-11 MED ORDER — LIDOCAINE HCL URETHRAL/MUCOSAL 2 % EX GEL
CUTANEOUS | Status: AC
  Filled 2022-06-11: qty 30

## 2022-06-11 MED ORDER — HYDRALAZINE HCL 20 MG/ML IJ SOLN
INTRAMUSCULAR | Status: AC
  Filled 2022-06-11: qty 1

## 2022-06-11 MED ORDER — FENTANYL CITRATE PF 50 MCG/ML IJ SOSY
25.0000 ug | PREFILLED_SYRINGE | INTRAMUSCULAR | Status: DC | PRN
  Administered 2022-06-11: 50 ug via INTRAVENOUS

## 2022-06-11 MED ORDER — PHENYLEPHRINE 80 MCG/ML (10ML) SYRINGE FOR IV PUSH (FOR BLOOD PRESSURE SUPPORT)
PREFILLED_SYRINGE | INTRAVENOUS | Status: DC | PRN
  Administered 2022-06-11: 80 ug via INTRAVENOUS
  Administered 2022-06-11: 40 ug via INTRAVENOUS

## 2022-06-11 MED ORDER — HYDRALAZINE HCL 20 MG/ML IJ SOLN
5.0000 mg | Freq: Once | INTRAMUSCULAR | Status: AC
  Administered 2022-06-11: 5 mg via INTRAVENOUS

## 2022-06-11 MED ORDER — SODIUM CHLORIDE 0.9 % IR SOLN
Status: DC | PRN
  Administered 2022-06-11: 24000 mL via INTRAVESICAL

## 2022-06-11 MED ORDER — ACETAMINOPHEN 10 MG/ML IV SOLN: 1000.0000 mg | Freq: Once | INTRAVENOUS | Status: DC | PRN

## 2022-06-11 MED ORDER — CLOPIDOGREL BISULFATE 75 MG PO TABS: 75.0000 mg | ORAL_TABLET | Freq: Every day | ORAL | 0 refills | Status: DC

## 2022-06-11 MED ORDER — ORAL CARE MOUTH RINSE: 15.0000 mL | Freq: Once | OROMUCOSAL | Status: AC

## 2022-06-11 MED ORDER — EPHEDRINE SULFATE-NACL 50-0.9 MG/10ML-% IV SOSY
PREFILLED_SYRINGE | INTRAVENOUS | Status: DC | PRN
  Administered 2022-06-11 (×3): 5 mg via INTRAVENOUS

## 2022-06-11 MED ORDER — FENTANYL CITRATE (PF) 250 MCG/5ML IJ SOLN
INTRAMUSCULAR | Status: DC | PRN
  Administered 2022-06-11: 10 ug via INTRAVENOUS
  Administered 2022-06-11: 15 ug via INTRAVENOUS
  Administered 2022-06-11: 10 ug via INTRAVENOUS
  Administered 2022-06-11: 25 ug via INTRAVENOUS

## 2022-06-11 MED ORDER — CHLORHEXIDINE GLUCONATE 0.12 % MT SOLN
15.0000 mL | Freq: Once | OROMUCOSAL | Status: AC
  Administered 2022-06-11: 15 mL via OROMUCOSAL

## 2022-06-11 MED ORDER — PROPOFOL 10 MG/ML IV BOLUS
INTRAVENOUS | Status: DC | PRN
  Administered 2022-06-11: 60 mg via INTRAVENOUS

## 2022-06-11 MED ORDER — LACTATED RINGERS IV SOLN: INTRAVENOUS | Status: DC

## 2022-06-11 SURGICAL SUPPLY — 19 items
BAG URINE DRAIN 2000ML AR STRL (UROLOGICAL SUPPLIES) ×2 IMPLANT
BAG URO CATCHER STRL LF (MISCELLANEOUS) ×2 IMPLANT
DRAPE FOOT SWITCH (DRAPES) ×2 IMPLANT
EVACUATOR MICROVAS BLADDER (UROLOGICAL SUPPLIES) ×2 IMPLANT
GLOVE SURG LX STRL 7.5 STRW (GLOVE) ×2 IMPLANT
GOWN STRL REUS W/ TWL XL LVL3 (GOWN DISPOSABLE) ×2 IMPLANT
GOWN STRL REUS W/TWL XL LVL3 (GOWN DISPOSABLE) ×2
GUIDEWIRE STR DUAL SENSOR (WIRE) IMPLANT
HOLDER FOLEY CATH W/STRAP (MISCELLANEOUS) IMPLANT
KIT TURNOVER KIT A (KITS) IMPLANT
LOOP CUT BIPOLAR 24F LRG (ELECTROSURGICAL) IMPLANT
MANIFOLD NEPTUNE II (INSTRUMENTS) ×2 IMPLANT
PACK CYSTO (CUSTOM PROCEDURE TRAY) ×2 IMPLANT
PENCIL SMOKE EVACUATOR (MISCELLANEOUS) IMPLANT
SYR 30ML LL (SYRINGE) IMPLANT
SYR TOOMEY IRRIG 70ML (MISCELLANEOUS) ×2
SYRINGE TOOMEY IRRIG 70ML (MISCELLANEOUS) ×2 IMPLANT
TUBING CONNECTING 10 (TUBING) ×2 IMPLANT
TUBING UROLOGY SET (TUBING) ×2 IMPLANT

## 2022-06-11 NOTE — Transfer of Care (Signed)
Immediate Anesthesia Transfer of Care Note  Patient: Tanner [name]  Procedure(s) Performed: TRANSURETHRAL RESECTION OF BLADDER TUMOR (TURBT)  >5cm (Right) TRANSURETHRAL RESECTION OF THE PROSTATE (TURP)  Patient Location: PACU  Anesthesia Type:General  Level of Consciousness: drowsy and responds to stimulation  Airway & Oxygen Therapy: Patient Spontanous Breathing and Patient connected to face mask oxygen  Post-op Assessment: Report given to RN and Post -op Vital signs reviewed and stable  Post vital signs: stable  Last Vitals:  Vitals Value Taken Time  BP 176/91 06/11/22 1203  Temp    Pulse 68 06/11/22 1204  Resp 13 06/11/22 1204  SpO2 100 % 06/11/22 1204  Vitals shown include unvalidated device data.  Last Pain:  Vitals:   06/11/22 0821  TempSrc: Oral  PainSc: 0-No pain         Complications: No notable events documented.

## 2022-06-11 NOTE — Discharge Instructions (Signed)
Remove foley catheter as instructed on Thursday morning as instructed. Please call the office at [phone] if you need assistance.

## 2022-06-11 NOTE — Anesthesia Procedure Notes (Signed)
Procedure Name: LMA Insertion Date/Time: 06/11/2022 10:35 AM  Performed by: Pilar Grammes, CRNAPre-anesthesia Checklist: Patient identified, Emergency Drugs available, Suction available, Patient being monitored and Timeout performed Patient Re-evaluated:Patient Re-evaluated prior to induction Oxygen Delivery Method: Circle system utilized Preoxygenation: Pre-oxygenation with 100% oxygen Induction Type: IV induction Ventilation: Mask ventilation without difficulty LMA: LMA inserted LMA Size: 4.0 Number of attempts: 1 Tube secured with: Tape Dental Injury: Injury to lip  Comments: Small lip lac left, from LMA insertion.  Pressure applied. Minimal bleeding.

## 2022-06-11 NOTE — Anesthesia Postprocedure Evaluation (Signed)
Anesthesia Post Note  Patient: Tanner [name]  Procedure(s) Performed: TRANSURETHRAL RESECTION OF BLADDER TUMOR (TURBT)  >5cm (Right) TRANSURETHRAL RESECTION OF THE PROSTATE (TURP)     Patient location during evaluation: PACU Anesthesia Type: General Level of consciousness: awake and alert Pain management: pain level controlled Vital Signs Assessment: post-procedure vital signs reviewed and stable Respiratory status: spontaneous breathing, nonlabored ventilation, respiratory function stable and patient connected to nasal cannula oxygen Cardiovascular status: blood pressure returned to baseline and stable Postop Assessment: no apparent nausea or vomiting Anesthetic complications: no   No notable events documented.  Last Vitals:  Vitals:   06/11/22 1315 06/11/22 1401  BP: (!) 155/62 (!) 164/77  Pulse: (!) 58 62  Resp: 10 16  Temp: 36.6 C 36.5 C  SpO2: 96% 100%    Last Pain:  Vitals:   06/11/22 1401  TempSrc:   PainSc: 0-No pain                 Belenda Cruise P Chidera Thivierge

## 2022-06-11 NOTE — Op Note (Signed)
Preoperative diagnosis: Bladder cancer overlapping sites Post operative diagnosis: Same  Procedure: TURBT greater than 5 cm, TURP  Surgeon: [name]  Anesthesia: General  Indication for procedure: Tanner [name] is a 88 year old male with a history of high-grade superficial bladder tumor.  He was lost for follow-up and family/friend brought him back.  He had carpeting of the bladder with multiple bladder tumors and thickening of the left anterior bladder wall on CT scan and cystoscopy.  There was no bone scan or lymphadenopathy.  Findings: On cystoscopy the fossa navicularis was tight and required slight dilation, prostatic urethra was borderline obstructive but had papillary tumor along the right and the left lateral lobes.  There was tumor along the bladder neck almost circumferentially.  Fortunately the ureteral orifice ease were not involved.  There was a right bladder tumor and carpeting of the right bladder wall and back toward the right diverticulum.  Most all of the tumor seems superficial.  There was no obvious tumor at the dome or anteriorly but it was thickened and this was biopsied with 4 good swipes of the loop.  Was not concern for perforation but given the extensive resection and minimal TURP I did leave a Foley catheter.  On exam under anesthesia foreskin appeared normal, glans and meatus appeared normal.  Scrotum appeared normal.  No lesions.  On DRE the prostate was about 30 g and smooth without hard area or nodule.  There was no palpable bladder mass.  Description of procedure: After consent was obtained patient brought to the operating room.  After adequate anesthesia he was placed lithotomy position and prepped and draped in the usual sterile fashion.  Timeout was performed to confirm the patient and procedure.  Cystoscope was passed per urethra the bladder carefully inspected with 30 and 70 degree lens.  I then swapped that out for the continuous-flow sheath passed with the visual  obturator but had to dilate the fossa to 30 [place].  The loop was then passed and I started and resected the prostatic urethra any papillary tumor.  This was sent as prostatic urethra.  I then went from left inferior all the way around to right inferior sweeping and cleaning the bladder neck.  Attention was then turned to the right bladder wall where a large swath of tumor was swept off and fulgurated back toward the diverticulum which was mostly clear.  Some tumor was resected.  A papillary tumor posterior to the UO was resected.  Finally, attention was turned toward the dome and left anterior.  With some pressure with the left hand the left anterior was resected with 4 good swipes and this was sent to pathology.  Clinically I could not tell if he had infiltrative tumor versus inflammation.  Hemostasis was excellent at low pressure is all resection sites were inspected.  The bladder was filled and the scope backed out.  A 20 French two-way Foley catheter was placed.  Exam under anesthesia was performed.  He was then awakened taken recovery room in stable condition.   Complications: None  Blood loss: Minimal  Specimens to pathology: #1 prostatic urethra #2 bladder neck #3 right bladder wall #4 left anterior bladder wall  Drains: 20 French Foley catheter  Disposition: Patient stable to PACU

## 2022-06-11 NOTE — Interval H&P Note (Signed)
History and Physical Interval Note:  06/11/2022 10:19 AM  Tanner [name]  has presented today for surgery, with the diagnosis of BLADDER CANCER.  The various methods of treatment have been discussed with the patient and family. After consideration of risks, benefits and other options for treatment, the patient has consented to  Procedure(s) with comments: TRANSURETHRAL RESECTION OF BLADDER TUMOR (TURBT) RIGHT URETERAL STENT (Right) TRANSURETHRAL RESECTION OF THE PROSTATE (TURP) (N/A) - REQUESTING 2 HRS as a surgical intervention.  The patient's history has been reviewed, patient examined, no change in status, stable for surgery.  I have reviewed the patient's chart and labs.  Questions were answered to the patient's satisfaction.  He is well. No dysuria or fever. No cough or congestion. No further gross hematuria but more frequency.    [name]

## 2022-06-11 NOTE — Progress Notes (Signed)
Nephew - Barnabas Lister is [place]. Documents available. Patient still makes his own decisions.

## 2022-06-12 ENCOUNTER — Encounter (HOSPITAL_COMMUNITY): Payer: Self-pay | Admitting: Urology

## 2022-06-12 LAB — SURGICAL PATHOLOGY

## 2022-06-14 ENCOUNTER — Encounter: Payer: Self-pay | Admitting: Internal Medicine

## 2022-08-06 ENCOUNTER — Emergency Department (HOSPITAL_COMMUNITY)
Admission: EM | Admit: 2022-08-06 | Discharge: 2022-08-06 | Disposition: A | Discharge status: Home / Self Care | Payer: Medicare Other | Attending: Student | Admitting: Student

## 2022-08-06 ENCOUNTER — Emergency Department (HOSPITAL_COMMUNITY): Payer: Medicare Other

## 2022-08-06 ENCOUNTER — Encounter (HOSPITAL_COMMUNITY): Payer: Self-pay | Admitting: Emergency Medicine

## 2022-08-06 ENCOUNTER — Other Ambulatory Visit: Payer: Self-pay

## 2022-08-06 DIAGNOSIS — Z79899 Other long term (current) drug therapy: Secondary | ICD-10-CM | POA: Diagnosis not present

## 2022-08-06 DIAGNOSIS — X58XXXA Exposure to other specified factors, initial encounter: Secondary | ICD-10-CM | POA: Insufficient documentation

## 2022-08-06 DIAGNOSIS — I2510 Atherosclerotic heart disease of native coronary artery without angina pectoris: Secondary | ICD-10-CM | POA: Insufficient documentation

## 2022-08-06 DIAGNOSIS — Z8551 Personal history of malignant neoplasm of bladder: Secondary | ICD-10-CM | POA: Insufficient documentation

## 2022-08-06 DIAGNOSIS — Z951 Presence of aortocoronary bypass graft: Secondary | ICD-10-CM | POA: Diagnosis not present

## 2022-08-06 DIAGNOSIS — Z8673 Personal history of transient ischemic attack (TIA), and cerebral infarction without residual deficits: Secondary | ICD-10-CM | POA: Insufficient documentation

## 2022-08-06 DIAGNOSIS — S0003XA Contusion of scalp, initial encounter: Secondary | ICD-10-CM | POA: Diagnosis present

## 2022-08-06 DIAGNOSIS — W19XXXA Unspecified fall, initial encounter: Secondary | ICD-10-CM

## 2022-08-06 LAB — COMPREHENSIVE METABOLIC PANEL
ALT: 66 U/L — ABNORMAL HIGH (ref 0–44)
AST: 69 U/L — ABNORMAL HIGH (ref 15–41)
Albumin: 3.6 g/dL (ref 3.5–5.0)
Alkaline Phosphatase: 59 U/L (ref 38–126)
Anion gap: 10 (ref 5–15)
BUN: 26 mg/dL — ABNORMAL HIGH (ref 8–23)
CO2: 23 mmol/L (ref 22–32)
Calcium: 9.5 mg/dL (ref 8.9–10.3)
Chloride: 102 mmol/L (ref 98–111)
Creatinine, Ser: 0.99 mg/dL (ref 0.61–1.24)
GFR, Estimated: >60 mL/min (ref >60)
Glucose, Bld: 206 mg/dL — ABNORMAL HIGH (ref 70–99)
Potassium: 4.4 mmol/L (ref 3.5–5.1)
Sodium: 135 mmol/L (ref 135–145)
Total Bilirubin: 1.1 mg/dL (ref 0.3–1.2)
Total Protein: 6.5 g/dL (ref 6.5–8.1)

## 2022-08-06 LAB — CBC
HCT: 39.5 % (ref 39.0–52.0)
Hemoglobin: 13.1 g/dL (ref 13.0–17.0)
MCH: 32.8 pg (ref 26.0–34.0)
MCHC: 33.2 g/dL (ref 30.0–36.0)
MCV: 98.8 fL (ref 80.0–100.0)
Platelets: 125 10*3/uL — ABNORMAL LOW (ref 150–400)
RBC: 4 MIL/uL — ABNORMAL LOW (ref 4.22–5.81)
RDW: 12.3 % (ref 11.5–15.5)
WBC: 4 10*3/uL (ref 4.0–10.5)
nRBC: 0 % (ref 0.0–0.2)

## 2022-08-06 LAB — LACTIC ACID, PLASMA: Lactic Acid, Venous: 1.8 mmol/L (ref 0.5–1.9)

## 2022-08-06 LAB — PROTIME-INR
INR: 1.1 (ref 0.8–1.2)
Prothrombin Time: 13.6 seconds (ref 11.4–15.2)

## 2022-08-06 LAB — I-STAT CHEM 8, ED
BUN: 27 mg/dL — ABNORMAL HIGH (ref 8–23)
Calcium, Ion: 1.25 mmol/L (ref 1.15–1.40)
Chloride: 101 mmol/L (ref 98–111)
Creatinine, Ser: 0.9 mg/dL (ref 0.61–1.24)
Glucose, Bld: 205 mg/dL — ABNORMAL HIGH (ref 70–99)
HCT: 38 % — ABNORMAL LOW (ref 39.0–52.0)
Hemoglobin: 12.9 g/dL — ABNORMAL LOW (ref 13.0–17.0)
Potassium: 4.4 mmol/L (ref 3.5–5.1)
Sodium: 137 mmol/L (ref 135–145)
TCO2: 25 mmol/L (ref 22–32)

## 2022-08-06 LAB — ETHANOL: Alcohol, Ethyl (B): <10 mg/dL (ref <10)

## 2022-08-06 NOTE — ED Notes (Signed)
ED Provider at bedside. 

## 2022-08-06 NOTE — ED Notes (Signed)
EDP notified of patient orthostatic results and symptoms.

## 2022-08-06 NOTE — Progress Notes (Signed)
Orthopedic Tech Progress Note Patient Details:  Tanner [name] [birth year] [phone] Level 2 Trauma. Not needed Patient ID: Tanner [name], male   DOB: [birth year], 88 y.o.   MRN: [id number]  Chip Boer 08/06/2022, 7:09 PM

## 2022-08-06 NOTE — ED Notes (Signed)
Patient wound cleaned and dressed. Bactrim applied

## 2022-08-06 NOTE — ED Provider Notes (Signed)
[name] Provider Note   CSN: [id number] Arrival date & time: 08/06/22  1859     History {Add pertinent medical, surgical, social history, OB history to HPI:1} Chief Complaint  Patient presents with   Tanner [name] is a 88 y.o. male with a past medical history of bladder cancer, TIA, hyperlipidemia, status post CABG x 2 brought in by [facility] to the emergency department for evaluation after fall.  Per EMS patient had a mechanical fall, he fell backward and hit his head.  Patient had hematoma and laceration to the back of his head.  No loss of consciousness.  Patient is taking Plavix.  Denies chest pain, shortness of breath, nausea, vomiting, change, urinary symptoms, pain elsewhere.  HPI    Past Medical History:  Diagnosis Date   Anticoagulant long-term use    plavix  (for hx TIA)   Arthritis    right shoulder    Benign essential tremor    hands -- occasional   CAD S/P percutaneous coronary angioplasty cardiologist-- dr harding   a) PCI and BMS to LAD in 1991; b) CATH 10/2010: Severe 2 Vessel disease = diffuse RCA ( 75% prox, 90% mid & 70 + 75% distal) & LAD (80-90% pre-stent, 75% post-stent) with occluded D1 (filled via OM-D1 collaterals)  -- CABG x 2; c) Myoview 4/'15: INTERMEDIATE RISK - small sized reversible apical/anterolateral defect -- most likely c/w known occlusion of D1 with collaterals.(med Rx)   History of basal cell carcinoma (BCC) excision    History of bladder cancer urologist-- dr eskridge   dx 11/ 2015-- s/p TURBT   History of kidney stones    History of TIA (transient ischemic attack) 12/09/2013   History of urethral stricture    Hyperlipidemia with target LDL less than 70    controlled.   Neuropathy of lower extremity    Nocturia    Osteoporosis    Peripheral neuropathy    lower extremity   Postoperative atrial fibrillation ([place]) 10/2010   after bypass-"shocked back in and no problems since":   per  cardiologist note, dr Ellyn Hack, no recurrence   Pre-diabetes    S/P CABG x 2 10/18/2010   LIMA-LAD, SVG to RPDA (Dr. [name])   [place] (transient global amnesia)    "x3 for hours then goes away"   Transient Dilated idiopathic cardiomyopathy April 2015; 04/2014   a) in setting of TIA: EF 35-40% (reduced from 55-65 percent) mild AI, moderate and dilated aorta.;; b) Recheck Echo 04/2014: EF 55-60%, Gr 1 DD, mild-mod AI.     Wears glasses    Wears partial dentures    upper   Past Surgical History:  Procedure Laterality Date   APPENDECTOMY  as teen   BASAL CELL CARCINOMA EXCISION  07/2014   CARDIAC CATHETERIZATION  10/17/2010   80%prox w/mild in-stent restenosis w/75% mid-LADstenosis aft the stent,multi severe stenoses RCAw/heavily calcified vessel,norm LV   CARDIOVERSION  2012   after bypass surgery   CATARACT EXTRACTION W/ INTRAOCULAR LENS  IMPLANT, BILATERAL  2009  approx.   COCCYX REMOVAL     CORONARY ANGIOPLASTY WITH STENT PLACEMENT  1991   PCI and BMS to LAD   CORONARY ARTERY BYPASS GRAFT  10-18-2010  dr Ricard Dillon '@[facility]    LIMA-LAD, SVG-rPDA   CYSTOSCOPY W/ RETROGRADES N/A 07/12/2014   Procedure: CYSTOSCOPY WITH RETROGRADE PYELOGRAM;  Surgeon: [name], MD;  Location: [facility];  Service: Urology;  Laterality: N/A;   CYSTOSCOPY WITH  BIOPSY N/A 09/09/2014   Procedure: CYSTO WITH BIOPSY AND FULGERATION, DILATION OF URETHRAL STRICTURE;  Surgeon: [name], MD;  Location: [facility];  Service: Urology;  Laterality: N/A;  BLADDER BIOPSY   CYSTOSCOPY WITH BIOPSY N/A 01/03/2015   Procedure: CYSTOSCOPY WITH BIOPSY;  Surgeon: [name], MD;  Location: [facility];  Service: Urology;  Laterality: N/A;   CYSTOSCOPY WITH BIOPSY N/A 01/26/2019   Procedure: CYSTOSCOPY WITH BIOPSY, BILATERAL RETROGRADE PYELOGRAM;  Surgeon: [name], MD;  Location: [facility];  Service: Urology;  Laterality: N/A;   CYSTOSCOPY WITH FULGERATION N/A 01/26/2019   Procedure: CYSTOSCOPY WITH FULGERATION;   Surgeon: [name], MD;  Location: [facility];  Service: Urology;  Laterality: N/A;   CYSTOSCOPY WITH URETHRAL DILATATION N/A 01/03/2015   Procedure: CYSTOSCOPY WITH URETHRAL DILATATION;  Surgeon: [name], MD;  Location: [facility];  Service: Urology;  Laterality: N/A;   DOPPLER ECHOCARDIOGRAPHY  10/18/2004   EF 55-65%,BORDERLINE -enlarged aortic root,mild aortic insuff.,trace tricus.insuff.   FINGER SURGERY Left 03-17-2001   dr Burney Gauze  '@[facility]'$    repair complex laceration injury 5th digit   KIDNEY STONE SURGERY  1989   NM MYOVIEW LTD  12/16/2013   INTERMEDIATE RISK test with small sized reversible perfusion defect in the apical anterolateral wall -- most likely consistent with known occlusion of D1 with collaterals.   shoulder surgery Right 1988   TONSILLECTOMY  as teen   TRANSTHORACIC ECHOCARDIOGRAM  April 2015   EF 35-40%, mild AI, moderately dilated ascending aorta   TRANSURETHRAL RESECTION OF BLADDER TUMOR Right 06/11/2022   Procedure: TRANSURETHRAL RESECTION OF BLADDER TUMOR (TURBT)  >5cm;  Surgeon: [name], MD;  Location: [facility];  Service: Urology;  Laterality: Right;   TRANSURETHRAL RESECTION OF PROSTATE N/A 07/12/2014   Procedure: TRANSURETHRAL RESECTION OF THE PROSTATE (TURP) WITH MITOMYCIN -C;  Surgeon: [name], MD;  Location: [facility];  Service: Urology;  Laterality: N/A;   TRANSURETHRAL RESECTION OF PROSTATE N/A 06/11/2022   Procedure: TRANSURETHRAL RESECTION OF THE PROSTATE (TURP);  Surgeon: [name], MD;  Location: [facility];  Service: Urology;  Laterality: N/A;  REQUESTING 2 HRS     Home Medications Prior to Admission medications   Medication Sig Start Date End Date Taking? Authorizing Provider  acetaminophen (TYLENOL) 500 MG tablet Take 1,000 mg by mouth every 6 (six) hours as needed for mild pain.    [provider]  Cholecalciferol (VITAMIN D3) 50 MCG (2000 UT) CAPS Take 2,000 Units by mouth daily.    [provider]  clopidogrel (PLAVIX) 75 MG tablet Take 1 tablet (75 mg total) by mouth daily. Schedule an appointment for further refills 06/14/22   [name], MD  Cyanocobalamin (VITAMIN B12 PO) Place 5,000 mcg under the tongue daily.    [provider]  denosumab (PROLIA) 60 MG/ML SOSY injection Inject 60 mg into the skin every 6 (six) months. 11/10/19   [provider]  ezetimibe (ZETIA) 10 MG tablet Take 10 mg by mouth daily.    [provider]  fish oil-omega-3 fatty acids 1000 MG capsule Take 1 g by mouth daily.     [provider]  gabapentin (NEURONTIN) 100 MG capsule Take 100 mg by mouth 2 (two) times daily.    [provider]  JARDIANCE 10 MG TABS tablet Take 10 mg by mouth daily. 04/18/21   [provider]  latanoprost (XALATAN) 0.005 % ophthalmic solution Place 1 drop into both eyes at bedtime. 04/02/22  [provider]  metoprolol succinate (TOPROL-XL) 25 MG 24 hr tablet TAKE 1/2 TABLET BY MOUTH DAILY Patient taking differently: Take 12.5 mg by mouth daily. 12/05/21   [name], MD  Multiple Vitamin (MULTIVITAMIN) capsule Take 1 capsule by mouth at bedtime.     [provider]  rosuvastatin (CRESTOR) 40 MG tablet TAKE 1 TABLET BY MOUTH DAILY Patient taking differently: Take 40 mg by mouth daily. 09/12/21   Minus Breeding, MD  timolol (TIMOPTIC) 0.5 % ophthalmic solution Place 1 drop into both eyes every morning. 04/02/22   [provider]      Allergies    Patient has no known allergies.    Review of Systems   Review of Systems  Skin:        Hematoma and skin tear on the back of his head.    Physical Exam Updated Vital Signs BP (!) 165/94   Pulse 67   Temp (!) 97.2 F (36.2 C) (Axillary)   Resp 19   Ht '5\' 10"'$  (1.778 m)   Wt 63.5 kg   SpO2 100%   BMI 20.09 kg/m  Physical Exam Vitals and nursing note reviewed.  Constitutional:      Appearance: Normal appearance.  HENT:      Head: Normocephalic and atraumatic.     Mouth/Throat:     Mouth: Mucous membranes are moist.  Eyes:     General: No scleral icterus. Cardiovascular:     Rate and Rhythm: Normal rate and regular rhythm.     Pulses: Normal pulses.     Heart sounds: Normal heart sounds.  Pulmonary:     Effort: Pulmonary effort is normal.     Breath sounds: Normal breath sounds.  Abdominal:     General: Abdomen is flat.     Palpations: Abdomen is soft.     Tenderness: There is no abdominal tenderness.  Musculoskeletal:        General: No deformity.     Comments: 3 x 3 cm hematoma and skin tear on the back of head. Bleeding is controlled.   Skin:    General: Skin is warm.     Findings: No rash.  Neurological:     General: No focal deficit present.     Mental Status: He is alert.  Psychiatric:        Mood and Affect: Mood normal.     ED Results / Procedures / Treatments   Labs (all labs ordered are listed, but only abnormal results are displayed) Labs Reviewed  COMPREHENSIVE METABOLIC PANEL - Abnormal; Notable for the following components:      Result Value   Glucose, Bld 206 (*)    BUN 26 (*)    AST 69 (*)    ALT 66 (*)    All other components within normal limits  CBC - Abnormal; Notable for the following components:   RBC 4.00 (*)    Platelets 125 (*)    All other components within normal limits  I-STAT CHEM 8, ED - Abnormal; Notable for the following components:   BUN 27 (*)    Glucose, Bld 205 (*)    Hemoglobin 12.9 (*)    HCT 38.0 (*)    All other components within normal limits  PROTIME-INR  ETHANOL  URINALYSIS, ROUTINE W REFLEX MICROSCOPIC  LACTIC ACID, PLASMA  I-STAT CHEM 8, ED    EKG None  Radiology CT HEAD WO CONTRAST (5MM)  Result Date: 08/06/2022 CLINICAL DATA:  Trauma. EXAM: CT  HEAD WITHOUT CONTRAST CT CERVICAL SPINE WITHOUT CONTRAST TECHNIQUE: Multidetector CT imaging of the head and cervical spine was performed following the standard protocol without  intravenous contrast. Multiplanar CT image reconstructions of the cervical spine were also generated. RADIATION DOSE REDUCTION: This exam was performed according to the departmental dose-optimization program which includes automated exposure control, adjustment of the mA and/or kV according to patient size and/or use of iterative reconstruction technique. COMPARISON:  Head CT dated 03/22/2022. FINDINGS: CT HEAD FINDINGS Brain: Moderate age-related atrophy and chronic microvascular ischemic changes. There is no acute intracranial hemorrhage. No mass effect or midline shift. No extra-axial fluid collection. Vascular: No hyperdense vessel or unexpected calcification. Skull: Normal. Negative for fracture or focal lesion. Sinuses/Orbits: Mild mucoperiosteal thickening of paranasal sinuses. Small air-fluid level in the left maxillary sinus. The mastoid air cells are clear. Other: Small scalp contusion over the posterior vertex. CT CERVICAL SPINE FINDINGS Alignment: No acute subluxation. Skull base and vertebrae: No acute fracture.  Osteoporosis. Soft tissues and spinal canal: No prevertebral fluid or swelling. No visible canal hematoma. Disc levels:  No acute findings.  Degenerative changes. Upper chest: Biapical subpleural scarring. Other: Bilateral carotid bulb calcified plaques. IMPRESSION: 1. No acute intracranial pathology. Moderate age-related atrophy and chronic microvascular ischemic changes. 2. No acute/traumatic cervical spine pathology. Electronically Signed   By: Anner Crete M.D.   On: 08/06/2022 19:59   CT Cervical Spine Wo Contrast  Result Date: 08/06/2022 CLINICAL DATA:  Trauma. EXAM: CT HEAD WITHOUT CONTRAST CT CERVICAL SPINE WITHOUT CONTRAST TECHNIQUE: Multidetector CT imaging of the head and cervical spine was performed following the standard protocol without intravenous contrast. Multiplanar CT image reconstructions of the cervical spine were also generated. RADIATION DOSE REDUCTION: This exam  was performed according to the departmental dose-optimization program which includes automated exposure control, adjustment of the mA and/or kV according to patient size and/or use of iterative reconstruction technique. COMPARISON:  Head CT dated 03/22/2022. FINDINGS: CT HEAD FINDINGS Brain: Moderate age-related atrophy and chronic microvascular ischemic changes. There is no acute intracranial hemorrhage. No mass effect or midline shift. No extra-axial fluid collection. Vascular: No hyperdense vessel or unexpected calcification. Skull: Normal. Negative for fracture or focal lesion. Sinuses/Orbits: Mild mucoperiosteal thickening of paranasal sinuses. Small air-fluid level in the left maxillary sinus. The mastoid air cells are clear. Other: Small scalp contusion over the posterior vertex. CT CERVICAL SPINE FINDINGS Alignment: No acute subluxation. Skull base and vertebrae: No acute fracture.  Osteoporosis. Soft tissues and spinal canal: No prevertebral fluid or swelling. No visible canal hematoma. Disc levels:  No acute findings.  Degenerative changes. Upper chest: Biapical subpleural scarring. Other: Bilateral carotid bulb calcified plaques. IMPRESSION: 1. No acute intracranial pathology. Moderate age-related atrophy and chronic microvascular ischemic changes. 2. No acute/traumatic cervical spine pathology. Electronically Signed   By: Anner Crete M.D.   On: 08/06/2022 19:59   DG Pelvis Portable  Result Date: 08/06/2022 CLINICAL DATA:  Trauma EXAM: PORTABLE PELVIS 1-2 VIEWS COMPARISON:  None Available. FINDINGS: There is no evidence of pelvic fracture or diastasis. Visualized frontal view of the hips grossly unremarkable with no acute fracture or dislocation. No pelvic bone lesions are seen. Degenerative changes of the visualized lower lumbar spine. IMPRESSION: Negative. Electronically Signed   By: Iven Finn M.D.   On: 08/06/2022 19:29   DG Chest Port 1 View  Result Date: 08/06/2022 CLINICAL DATA:   Trauma Fall on blood thinners. Pt denies chest pain and pelvis pain. EXAM: PORTABLE CHEST 1 VIEW COMPARISON:  Chest x-ray 03/23/2019 FINDINGS: The heart and mediastinal contours are unchanged. Aortic calcification. Surgical changes overlie the mediastinum. Basilar atelectasis. No focal consolidation. No pulmonary edema. No pleural effusion. No pneumothorax. No acute osseous abnormality. Old healed left rib fractures. Intact sternotomy wires. Severe degenerative changes of the right shoulder. IMPRESSION: No active disease. Electronically Signed   By: Iven Finn M.D.   On: 08/06/2022 19:28    Procedures Procedures  {Document cardiac monitor, telemetry assessment procedure when appropriate:1}  Medications Ordered in ED Medications - No data to display  ED Course/ Medical Decision Making/ A&P                           Medical Decision Making Amount and/or Complexity of Data Reviewed Radiology: ordered.   This patient presents to the ED for evaluation post fall, this involves an extensive number of treatment options, and is a complaint that carries with a high risk of complications and morbidity.  The differential diagnosis includes ICH, skull fracture, hematoma, arrhythmia, bone fracture, dislocation.  This is not an exhaustive list.  Comorbidities that complicate the patient evaluation See HPI  Social determinants of health NA  Additional history obtained: External records from outside source obtained and reviewed including: Chart review including previous notes, labs, imaging.  Cardiac monitoring/EKG: The patient was maintained on a cardiac monitor.  I personally reviewed and interpreted the cardiac monitor which showed an underlying rhythm of: Sinus rhythm.  Lab tests: I ordered and personally interpreted labs.  The pertinent results include: WBC unremarkable. Hbg unremarkable. Platelets unremarkable. No electrolyte abnormalities noted. BUN, creatinine unremarkable. LFT  unremarkable. UA significant for no acute abnormality.   Imaging studies: I ordered imaging studies including: CT head, CT cervical spine, x-ray pelvis, x-ray chest all negative.  I personally reviewed, interpreted imaging and agree with the radiologist's interpretations.  Problem list/ ED course/ Critical interventions/ Medical management: HPI: See above Vital signs significant for BP of 165/94 otherwise within normal range and stable throughout visit. Laboratory/imaging studies significant for: See above. On physical examination, patient is afebrile and appears in no acute distress.  There was a 3 x 3 cm hematoma and skin tear on the back of patient's head.  Bleeding is controlled. CT head, CT cervical spine, x-ray pelvis, x-ray chest all negative.  No evidence of intracranial hemorrhage or skull fracture. Patient's clinical presentations and laboratory/imaging studies are most concerned for hematoma of the scalp and musculoskeletal pain.  EKG without ischemic changes. *** ordered. Reevaluation of the patient after these medications showed that the patient {resolved/improved/worsened:23923::"improved"}. Advised patient to ***. I have reviewed the patient home medicines and have made adjustments as needed.  Consultations obtained: I requested consultation with Dr. Matilde Sprang, and discussed lab and imaging findings as well as pertinent plan.  He/she agrees with the plan.  Disposition Continued outpatient therapy. Follow-up with PCP recommended for reevaluation of symptoms. Treatment plan discussed with patient.  Pt acknowledged understanding was agreeable to the plan. Worrisome signs and symptoms were discussed with patient, and patient acknowledged understanding to return to the ED if they noticed these signs and symptoms. Patient was stable upon discharge.   This chart was dictated using voice recognition software.  Despite best efforts to proofread,  errors can occur which can change the  documentation meaning.    {Document critical care time when appropriate:1} {Document review of labs and clinical decision tools ie heart score, Chads2Vasc2 etc:1}  {Document your independent review of radiology  images, and any outside records:1} {Document your discussion with family members, caretakers, and with consultants:1} {Document social determinants of health affecting pt's care:1} {Document your decision making why or why not admission, treatments were needed:1} Final Clinical Impression(s) / ED Diagnoses Final diagnoses:  None    Rx / DC Orders ED Discharge Orders     None

## 2022-08-06 NOTE — Discharge Instructions (Addendum)
Please take your medications as prescribed. Please take tylenol/ibuprofen for pain. I recommend close follow-up with PCP for reevaluation.  Please do not hesitate to return to emergency department if worrisome signs symptoms we discussed become apparent.

## 2022-08-06 NOTE — Progress Notes (Signed)
Chaplain responded to Level 2, fall on blood thinners. Please page if chaplain needed. Rev. Tamsen Snider Pager [phone]

## 2022-08-25 ENCOUNTER — Emergency Department (HOSPITAL_COMMUNITY): Payer: Medicare Other

## 2022-08-25 ENCOUNTER — Emergency Department (HOSPITAL_COMMUNITY)
Admission: EM | Admit: 2022-08-25 | Discharge: 2022-08-25 | Disposition: A | Discharge status: Home / Self Care | Payer: Medicare Other | Attending: Emergency Medicine | Admitting: Emergency Medicine

## 2022-08-25 DIAGNOSIS — S0990XA Unspecified injury of head, initial encounter: Secondary | ICD-10-CM

## 2022-08-25 DIAGNOSIS — Z79899 Other long term (current) drug therapy: Secondary | ICD-10-CM | POA: Diagnosis not present

## 2022-08-25 DIAGNOSIS — W06XXXA Fall from bed, initial encounter: Secondary | ICD-10-CM | POA: Diagnosis not present

## 2022-08-25 DIAGNOSIS — I2510 Atherosclerotic heart disease of native coronary artery without angina pectoris: Secondary | ICD-10-CM | POA: Insufficient documentation

## 2022-08-25 DIAGNOSIS — S0081XA Abrasion of other part of head, initial encounter: Secondary | ICD-10-CM | POA: Insufficient documentation

## 2022-08-25 DIAGNOSIS — Z87891 Personal history of nicotine dependence: Secondary | ICD-10-CM | POA: Insufficient documentation

## 2022-08-25 DIAGNOSIS — Z7902 Long term (current) use of antithrombotics/antiplatelets: Secondary | ICD-10-CM | POA: Diagnosis not present

## 2022-08-25 DIAGNOSIS — Z951 Presence of aortocoronary bypass graft: Secondary | ICD-10-CM | POA: Insufficient documentation

## 2022-08-25 DIAGNOSIS — Z8551 Personal history of malignant neoplasm of bladder: Secondary | ICD-10-CM | POA: Diagnosis not present

## 2022-08-25 DIAGNOSIS — Y92129 Unspecified place in nursing home as the place of occurrence of the external cause: Secondary | ICD-10-CM | POA: Diagnosis not present

## 2022-08-25 DIAGNOSIS — Z7984 Long term (current) use of oral hypoglycemic drugs: Secondary | ICD-10-CM | POA: Insufficient documentation

## 2022-08-25 DIAGNOSIS — W19XXXA Unspecified fall, initial encounter: Secondary | ICD-10-CM

## 2022-08-25 DIAGNOSIS — S40211A Abrasion of right shoulder, initial encounter: Secondary | ICD-10-CM | POA: Diagnosis not present

## 2022-08-25 DIAGNOSIS — Z23 Encounter for immunization: Secondary | ICD-10-CM | POA: Diagnosis not present

## 2022-08-25 LAB — CBC
HCT: 40.8 % (ref 39.0–52.0)
Hemoglobin: 13 g/dL (ref 13.0–17.0)
MCH: 31.9 pg (ref 26.0–34.0)
MCHC: 31.9 g/dL (ref 30.0–36.0)
MCV: 100.2 fL — ABNORMAL HIGH (ref 80.0–100.0)
Platelets: 161 10*3/uL (ref 150–400)
RBC: 4.07 MIL/uL — ABNORMAL LOW (ref 4.22–5.81)
RDW: 12.5 % (ref 11.5–15.5)
WBC: 5.2 10*3/uL (ref 4.0–10.5)
nRBC: 0 % (ref 0.0–0.2)

## 2022-08-25 LAB — BASIC METABOLIC PANEL
Anion gap: 8 (ref 5–15)
BUN: 25 mg/dL — ABNORMAL HIGH (ref 8–23)
CO2: 25 mmol/L (ref 22–32)
Calcium: 9.7 mg/dL (ref 8.9–10.3)
Chloride: 104 mmol/L (ref 98–111)
Creatinine, Ser: 0.86 mg/dL (ref 0.61–1.24)
GFR, Estimated: >60 mL/min (ref >60)
Glucose, Bld: 171 mg/dL — ABNORMAL HIGH (ref 70–99)
Potassium: 4.3 mmol/L (ref 3.5–5.1)
Sodium: 137 mmol/L (ref 135–145)

## 2022-08-25 LAB — PROTIME-INR
INR: 1.1 (ref 0.8–1.2)
Prothrombin Time: 13.7 seconds (ref 11.4–15.2)

## 2022-08-25 MED ORDER — TETANUS-DIPHTH-ACELL PERTUSSIS 5-2.5-18.5 LF-MCG/0.5 IM SUSY
0.5000 mL | PREFILLED_SYRINGE | Freq: Once | INTRAMUSCULAR | Status: AC
  Administered 2022-08-25: 0.5 mL via INTRAMUSCULAR
  Filled 2022-08-25: qty 0.5

## 2022-08-25 NOTE — ED Triage Notes (Signed)
Pt BIB EMS [facility] after a backwards fall from standing when walking with walker. Pt is A&Ox4 with some confusion (baseline). Pt presents with skin care to back of head and right shoulder.

## 2022-08-25 NOTE — Progress Notes (Signed)
Orthopedic Tech Progress Note Patient Details:  Tanner [name] 1924-08-05 [id number]  Level 2 trauma Patient ID: Tanner [name], male   DOB: 04-23-1924, 88 y.o.   MRN: [id number]  Tanner [name] 08/25/2022, 6:48 PM

## 2022-08-25 NOTE — Discharge Instructions (Addendum)
It was a pleasure caring for you today in the emergency department. ° °Please return to the emergency department for any worsening or worrisome symptoms. ° ° °

## 2022-08-25 NOTE — ED Notes (Signed)
X-ray at bedside

## 2022-08-25 NOTE — ED Notes (Signed)
Pt removing wires and IV stating he wanted to go home. This tech advised pt that we were waiting on his ride, and gave pt sandwich bag. Pt redirectable and willing to wait at this time

## 2022-08-30 NOTE — ED Provider Notes (Signed)
[facility] EMERGENCY DEPARTMENT Provider Note  CSN: [id number] Arrival date & time: 08/25/22 1835  Chief Complaint(s) Fall (Fall on thinners)  HPI Tanner [name] is a 88 y.o. male   with past medical history as below, significant for tia on plavix, hld, peripheral neuropathy LE, cad, cabg who presents to the ED with complaint of fall. Pt resides at facility, reports he was trying to get out of bed and lots his footing and fell backwards from his walker, fell backwards. Denies LOC. No n/v since the incident. Has no sig pain at this time but was noted to have abrasion to scalp after the fall. Arrived as level 2 trauma given fall w/ head injury on plavix. Reports was in normal state of health pta. Has confusion at baseline, intermittent. History per ems and pt.   Past Medical History Past Medical History:  Diagnosis Date   Anticoagulant long-term use    plavix  (for hx TIA)   Arthritis    right shoulder    Benign essential tremor    hands -- occasional   CAD S/P percutaneous coronary angioplasty cardiologist-- dr harding   a) PCI and BMS to LAD in 1991; b) CATH 10/2010: Severe 2 Vessel disease = diffuse RCA ( 75% prox, 90% mid & 70 + 75% distal) & LAD (80-90% pre-stent, 75% post-stent) with occluded D1 (filled via OM-D1 collaterals)  -- CABG x 2; c) Myoview 4/'15: INTERMEDIATE RISK - small sized reversible apical/anterolateral defect -- most likely c/w known occlusion of D1 with collaterals.(med Rx)   History of basal cell carcinoma (BCC) excision    History of bladder cancer urologist-- dr eskridge   dx 11/ 2015-- s/p TURBT   History of kidney stones    History of TIA (transient ischemic attack) 12/09/2013   History of urethral stricture    Hyperlipidemia with target LDL less than 70    controlled.   Neuropathy of lower extremity    Nocturia    Osteoporosis    Peripheral neuropathy    lower extremity   Postoperative atrial fibrillation ([place]) 10/2010   after  bypass-"shocked back in and no problems since":   per cardiologist note, dr Ellyn Hack, no recurrence   Pre-diabetes    S/P CABG x 2 10/18/2010   LIMA-LAD, SVG to RPDA (Dr. 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     Wears glasses    Wears partial dentures    upper   Patient Active Problem List   Diagnosis Date Noted   Paroxysmal tachycardia ([place]) 06/14/2021   Sepsis secondary to UTI ([place]) 03/23/2019   Hypokalemia 03/23/2019   Prediabetes 03/23/2019   Two-vessel CAD status post CABG x2;     Bladder neoplasm 07/12/2014   Sinus bradycardia 07/12/2014   Unspecified hereditary and idiopathic peripheral neuropathy 01/20/2014   Transient Congestive dilated cardiomyopathy 12/11/2013   TIA (transient ischemic attack) 12/09/2013   Thrombocytopenia ([place]) 12/09/2013   Dyslipidemia, goal LDL below 70 03/16/2013   Mild aortic insufficiency 02/08/2013   AF (atrial fibrillation) -- Post Operative    Presence of bare metal stent in LAD coronary artery: With significant stenosis on either end of the stent 03/17/2011   Home Medication(s) Prior to Admission  medications   Medication Sig Start Date End Date Taking? Authorizing Provider  acetaminophen (TYLENOL) 500 MG tablet Take 1,000 mg by mouth every 6 (six) hours as needed for mild pain.    [provider]  Cholecalciferol (VITAMIN D3) 50 MCG (2000 UT) CAPS Take 2,000 Units by mouth daily.    [provider]  clopidogrel (PLAVIX) 75 MG tablet Take 1 tablet (75 mg total) by mouth daily. Schedule an appointment for further refills 06/14/22   [name], MD  Cyanocobalamin (VITAMIN B12 PO) Place 5,000 mcg under the tongue daily.    [provider]  denosumab (PROLIA)  60 MG/ML SOSY injection Inject 60 mg into the skin every 6 (six) months. 11/10/19   [provider]  ezetimibe (ZETIA) 10 MG tablet Take 10 mg by mouth daily.    [provider]  fish oil-omega-3 fatty acids 1000 MG capsule Take 1 g by mouth daily.     [provider]  gabapentin (NEURONTIN) 100 MG capsule Take 100 mg by mouth 2 (two) times daily.    [provider]  JARDIANCE 10 MG TABS tablet Take 10 mg by mouth daily. 04/18/21   [provider]  latanoprost (XALATAN) 0.005 % ophthalmic solution Place 1 drop into both eyes at bedtime. 04/02/22   [provider]  metoprolol succinate (TOPROL-XL) 25 MG 24 hr tablet TAKE 1/2 TABLET BY MOUTH DAILY Patient taking differently: Take 12.5 mg by mouth daily. 12/05/21   [name], MD  Multiple Vitamin (MULTIVITAMIN) capsule Take 1 capsule by mouth at bedtime.     [provider]  rosuvastatin (CRESTOR) 40 MG tablet TAKE 1 TABLET BY MOUTH DAILY Patient taking differently: Take 40 mg by mouth daily. 09/12/21   Minus Breeding, MD  timolol (TIMOPTIC) 0.5 % ophthalmic solution Place 1 drop into both eyes every morning. 04/02/22   [provider]                                                                                                                                    Past Surgical History Past Surgical History:  Procedure Laterality Date   APPENDECTOMY  as teen   BASAL CELL CARCINOMA EXCISION  07/2014   CARDIAC CATHETERIZATION  10/17/2010   80%prox w/mild in-stent restenosis w/75% mid-LADstenosis aft the stent,multi severe stenoses RCAw/heavily calcified vessel,norm LV   CARDIOVERSION  2012   after bypass surgery   CATARACT EXTRACTION W/ INTRAOCULAR LENS  IMPLANT, BILATERAL  2009  approx.   COCCYX REMOVAL     CORONARY ANGIOPLASTY WITH STENT PLACEMENT  1991   PCI and BMS to LAD   CORONARY ARTERY BYPASS GRAFT  10-18-2010  dr Ricard Dillon '@[facility]    LIMA-LAD, SVG-rPDA   CYSTOSCOPY W/  RETROGRADES N/A 07/12/2014   Procedure: CYSTOSCOPY WITH RETROGRADE PYELOGRAM;  Surgeon: [name], MD;  Location: [facility];  Service: Urology;  Laterality: N/A;   CYSTOSCOPY WITH BIOPSY  N/A 09/09/2014   Procedure: CYSTO WITH BIOPSY AND FULGERATION, DILATION OF URETHRAL STRICTURE;  Surgeon: [name], MD;  Location: [facility];  Service: Urology;  Laterality: N/A;  BLADDER BIOPSY   CYSTOSCOPY WITH BIOPSY N/A 01/03/2015   Procedure: CYSTOSCOPY WITH BIOPSY;  Surgeon: [name], MD;  Location: [facility];  Service: Urology;  Laterality: N/A;   CYSTOSCOPY WITH BIOPSY N/A 01/26/2019   Procedure: CYSTOSCOPY WITH BIOPSY, BILATERAL RETROGRADE PYELOGRAM;  Surgeon: [name], MD;  Location[facility];  Service: Urology;  Laterality: N/A;   CYSTOSCOPY WITH FULGERATION N/A 01/26/2019   Procedure: CYSTOSCOPY WITH FULGERATION;  Surgeon: [name], MD;  Location: [facility];  Service: Urology;  Laterality: N/A;   CYSTOSCOPY WITH URETHRAL DILATATION N/A 01/03/2015   Procedure: CYSTOSCOPY WITH URETHRAL DILATATION;  Surgeon: [name], MD;  Location: [facility];  Service: Urology;  Laterality: N/A;   DOPPLER ECHOCARDIOGRAPHY  10/18/2004   EF 55-65%,BORDERLINE -enlarged aortic root,mild aortic insuff.,trace tricus.insuff.   FINGER SURGERY Left 03-17-2001   dr Burney Gauze  '@[facility]'$    repair complex laceration injury 5th digit   KIDNEY STONE SURGERY  1989   NM MYOVIEW LTD  12/16/2013   INTERMEDIATE RISK test with small sized reversible perfusion defect in the apical anterolateral wall -- most likely consistent with known occlusion of D1 with collaterals.   shoulder surgery Right 1988   TONSILLECTOMY  as teen   TRANSTHORACIC ECHOCARDIOGRAM  April 2015   EF 35-40%, mild AI, moderately dilated ascending aorta   TRANSURETHRAL RESECTION OF BLADDER TUMOR Right 06/11/2022   Procedure: TRANSURETHRAL RESECTION OF BLADDER TUMOR (TURBT)  >5cm;  Surgeon: [name], MD;   Location: [facility];  Service: Urology;  Laterality: Right;   TRANSURETHRAL RESECTION OF PROSTATE N/A 07/12/2014   Procedure: TRANSURETHRAL RESECTION OF THE PROSTATE (TURP) WITH MITOMYCIN -C;  Surgeon: [name], MD;  Location: [facility];  Service: Urology;  Laterality: N/A;   TRANSURETHRAL RESECTION OF PROSTATE N/A 06/11/2022   Procedure: TRANSURETHRAL RESECTION OF THE PROSTATE (TURP);  Surgeon: [name], MD;  Location: [facility];  Service: Urology;  Laterality: N/A;  REQUESTING 2 HRS   Family History Family History  Problem Relation Age of Onset   Heart Problems Mother    Parkinson's disease Father    Heart Problems Father     Social History Social History   Tobacco Use   Smoking status: Former    Packs/day: 1.00    Years: 5.00    Total pack years: 5.00    Types: Cigarettes    Quit date: 09/02/1957    Years since quitting: 65.0   Smokeless tobacco: Never  Vaping Use   Vaping Use: Never used  Substance Use Topics   Alcohol use: Never    Alcohol/week: 0.0 standard drinks of alcohol   Drug use: Never   Allergies Patient has no known allergies.  Review of Systems Review of Systems  Constitutional:  Negative for chills and fever.  HENT:  Negative for facial swelling and trouble swallowing.   Eyes:  Negative for photophobia and visual disturbance.  Respiratory:  Negative for cough and shortness of breath.   Cardiovascular:  Negative for chest pain and palpitations.  Gastrointestinal:  Negative for abdominal pain, nausea and vomiting.  Endocrine: Negative for polydipsia and polyuria.  Genitourinary:  Negative for difficulty urinating and hematuria.  Musculoskeletal:  Negative for gait problem and joint swelling.  Skin:  Positive for wound. Negative for pallor and rash.  Neurological:  Negative for syncope  and headaches.  Psychiatric/Behavioral:  Negative for agitation and confusion.     Physical Exam Vital Signs  I have reviewed the triage vital signs BP (!)  163/86   Pulse 68   Temp 98.1 F (36.7 C) (Oral)   Resp 16   Ht '5\' 10"'$  (1.778 m)   Wt 63.5 kg   SpO2 100%   BMI 20.09 kg/m  Physical Exam Vitals and nursing note reviewed.  Constitutional:      General: He is not in acute distress.    Appearance: Normal appearance. He is well-developed. He is not ill-appearing, toxic-appearing or diaphoretic.  HENT:     Head: Normocephalic. No raccoon eyes, Battle's sign, right periorbital erythema or left periorbital erythema.     Jaw: There is normal jaw occlusion.     Comments: Abrasion top of head, no active bleeding    Right Ear: External ear normal.     Left Ear: External ear normal.     Mouth/Throat:     Mouth: Mucous membranes are moist.  Eyes:     General: No scleral icterus.    Extraocular Movements: Extraocular movements intact.     Pupils: Pupils are equal, round, and reactive to light.  Cardiovascular:     Rate and Rhythm: Normal rate and regular rhythm.     Pulses: Normal pulses.     Heart sounds: Normal heart sounds.  Pulmonary:     Effort: Pulmonary effort is normal. No respiratory distress.     Breath sounds: Normal breath sounds.  Abdominal:     General: Abdomen is flat.     Palpations: Abdomen is soft.     Tenderness: There is no abdominal tenderness.  Musculoskeletal:        General: Normal range of motion.       Arms:     Cervical back: Normal range of motion.     Right lower leg: No edema.     Left lower leg: No edema.     Comments: No midline spinous process tenderness to palpation or percussion, no crepitus or step-off.  Rectal tone is intact.  BL UE and BL LE are NVI  Pelvis stable to ap pressure  No sig discomfort w/ log roll b/l le  Skin:    General: Skin is warm and dry.     Capillary Refill: Capillary refill takes less than 2 seconds.  Neurological:     Mental Status: He is alert and oriented to person, place, and time.     GCS: GCS eye subscore is 4. GCS verbal subscore is 5. GCS motor subscore is  6.     Cranial Nerves: Cranial nerves 2-12 are intact.     Sensory: Sensation is intact.     Motor: Motor function is intact.     Coordination: Coordination is intact.     Comments: Gait not tested 2/2 pt safety  Psychiatric:        Mood and Affect: Mood normal.        Behavior: Behavior normal.     ED Results and Treatments Labs (all labs ordered are listed, but only abnormal results are displayed) Labs Reviewed  CBC - Abnormal; Notable for the following components:      Result Value   RBC 4.07 (*)    MCV 100.2 (*)    All other components within normal limits  BASIC METABOLIC PANEL - Abnormal; Notable for the following components:   Glucose, Bld 171 (*)    BUN 25 (*)  All other components within normal limits  PROTIME-INR                                                                                                                          Radiology No results found.  Pertinent labs & imaging results that were available during my care of the patient were reviewed by me and considered in my medical decision making (see MDM for details).  Medications Ordered in ED Medications  Tdap (BOOSTRIX) injection 0.5 mL (0.5 mLs Intramuscular Given 08/25/22 2217)                                                                                                                                     Procedures .Critical Care  Performed by: Jeanell Sparrow, DO Authorized by: Jeanell Sparrow, DO   Critical care provider statement:    Critical care time (minutes):  30   Critical care time was exclusive of:  Separately billable procedures and treating other patients   Critical care was necessary to treat or prevent imminent or life-threatening deterioration of the following conditions:  Trauma   Critical care was time spent personally by me on the following activities:  Development of treatment plan with patient or surrogate, discussions with consultants, evaluation of patient's response to  treatment, examination of patient, ordering and review of laboratory studies, ordering and review of radiographic studies, ordering and performing treatments and interventions, pulse oximetry, re-evaluation of patient's condition, review of old charts and obtaining history from patient or surrogate   (including critical care time)  Medical Decision Making / ED Course   MDM:  DIAN MINAHAN is a 88 y.o. male   with past medical history as above, significant for as above which further complicates the presenting complaint. Pt presents to the ED with complaint of fall. The complaint involves an extensive differential diagnosis and also carries with it a high risk of complications and morbidity.  Serious etiology was considered. Ddx includes but is not limited to: Differential diagnoses for head trauma includes subdural hematoma, epidural hematoma, acute concussion, traumatic subarachnoid hemorrhage, cerebral contusions, etc.  Primary survey with injuries as noted above on physical exam. O/w wnl   Patient presents with low mechanism head trauma. On initial evaluation patient appears in no acute distress, afebrile with normal vital signs. Patient has completely intact neurovascular exam, and pain improved in ED. CT  imaging pursued  and found to be without acute pathology. Tetanus updated. XR chest/shoulder wnl  Likely mechanical fall, safe for dc back to facility   Discussed possible etiology of concussion, signs and symptoms, and discharge instructions. Detailed discussions were had with the patient regarding current findings, and need for close f/u with PCP or on call doctor. The patient has been instructed to return immediately if the symptoms worsen in any way for re-evaluation. Patient verbalized understanding and is in agreement with current care plan. All questions answered prior to discharge   Additional history obtained: -Additional history obtained from family and ems -External records  from outside source obtained and reviewed including: Chart review including previous notes, labs, imaging, consultation notes including prior ed visits, prior labs/imaging/home meds   Lab Tests: -I ordered, reviewed, and interpreted labs.   The pertinent results include:   Labs Reviewed  CBC - Abnormal; Notable for the following components:      Result Value   RBC 4.07 (*)    MCV 100.2 (*)    All other components within normal limits  BASIC METABOLIC PANEL - Abnormal; Notable for the following components:   Glucose, Bld 171 (*)    BUN 25 (*)    All other components within normal limits  PROTIME-INR    Notable for elev glucose, similar to prior, not dka  EKG   EKG Interpretation  Date/Time:    Ventricular Rate:    PR Interval:    QRS Duration:   QT Interval:    QTC Calculation:   R Axis:     Text Interpretation:           Imaging Studies ordered: I ordered imaging studies including ct head, ct c-spine, chest xr, shoulder right xr On my interpretation imaging demonstrates no acute process I independently visualized and interpreted imaging. I agree with the radiologist interpretation   Medicines ordered and prescription drug management: Meds ordered this encounter  Medications   Tdap (BOOSTRIX) injection 0.5 mL    -I have reviewed the patients home medicines and have made adjustments as needed   Consultations Obtained: I requested consultation with the na,  and discussed lab and imaging findings as well as pertinent plan - they recommend: na   Cardiac Monitoring: The patient was maintained on a cardiac monitor.  I personally viewed and interpreted the cardiac monitored which showed an underlying rhythm of: nsr  Social Determinants of Health:  Diagnosis or treatment significantly limited by social determinants of health: nursing home resident   Reevaluation: After the interventions noted above, I reevaluated the patient and found that they have  improved  Co morbidities that complicate the patient evaluation  Past Medical History:  Diagnosis Date   Anticoagulant long-term use    plavix  (for hx TIA)   Arthritis    right shoulder    Benign essential tremor    hands -- occasional   CAD S/P percutaneous coronary angioplasty cardiologist-- dr harding   a) PCI and BMS to LAD in 1991; b) CATH 10/2010: Severe 2 Vessel disease = diffuse RCA ( 75% prox, 90% mid & 70 + 75% distal) & LAD (80-90% pre-stent, 75% post-stent) with occluded D1 (filled via OM-D1 collaterals)  -- CABG x 2; c) Myoview 4/'15: INTERMEDIATE RISK - small sized reversible apical/anterolateral defect -- most likely c/w known occlusion of D1 with collaterals.(med Rx)   History of basal cell carcinoma (BCC) excision    History of bladder cancer urologist-- dr [name]  dx 11/ 2015-- s/p TURBT   History of kidney stones    History of TIA (transient ischemic attack) 12/09/2013   History of urethral stricture    Hyperlipidemia with target LDL less than 70    controlled.   Neuropathy of lower extremity    Nocturia    Osteoporosis    Peripheral neuropathy    lower extremity   Postoperative atrial fibrillation ([place]) 10/2010   after bypass-"shocked back in and no problems since":   per cardiologist note, dr Ellyn Hack, no recurrence   Pre-diabetes    S/P CABG x 2 10/18/2010   LIMA-LAD, SVG to RPDA (Dr. 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     Wears glasses    Wears partial dentures    upper      Dispostion: Disposition decision including need for hospitalization was considered, and patient discharged from emergency department.    Final Clinical Impression(s) / ED Diagnoses Final diagnoses:  Fall, initial  encounter  Injury of head, initial encounter     This chart was dictated using voice recognition software.  Despite best efforts to proofread,  errors can occur which can change the documentation meaning.    Jeanell Sparrow, DO 08/30/22 [phone]

## 2022-11-04 ENCOUNTER — Encounter: Payer: Self-pay | Admitting: Cardiology

## 2022-11-04 ENCOUNTER — Ambulatory Visit: Payer: Medicare Other | Attending: Cardiology | Admitting: Cardiology

## 2022-11-04 VITALS — BP 141/70 | HR 63 | Ht 69.0 in | Wt 157.0 lb

## 2022-11-04 DIAGNOSIS — E785 Hyperlipidemia, unspecified: Secondary | ICD-10-CM

## 2022-11-04 DIAGNOSIS — I479 Paroxysmal tachycardia, unspecified: Secondary | ICD-10-CM | POA: Diagnosis not present

## 2022-11-04 DIAGNOSIS — R001 Bradycardia, unspecified: Secondary | ICD-10-CM

## 2022-11-04 DIAGNOSIS — I2510 Atherosclerotic heart disease of native coronary artery without angina pectoris: Secondary | ICD-10-CM | POA: Diagnosis not present

## 2022-11-04 DIAGNOSIS — I351 Nonrheumatic aortic (valve) insufficiency: Secondary | ICD-10-CM

## 2022-11-04 DIAGNOSIS — I480 Paroxysmal atrial fibrillation: Secondary | ICD-10-CM

## 2022-11-04 NOTE — Patient Instructions (Signed)

## 2022-11-04 NOTE — Progress Notes (Signed)
Primary Care Provider: Deland Pretty, [place] Cardiologist: Glenetta Hew, MD Electrophysiologist: None  Clinic Note: Chief Complaint  Patient presents with   Follow-up    6 months.  Doing well.   Coronary Artery Disease    No angina   ===================================  ASSESSMENT/PLAN   Problem List Items Addressed This Visit       Cardiology Problems   Two-vessel CAD status post CABG x2;  - Primary (Chronic)    Doing well with no active anginal symptoms.  Per discussion, we will hold off on any ischemic evaluation unless he were to have symptoms.  I do not think there would be a good option to consider invasive evaluation, despite the results.  Continue Toprol along with rosuvastatin, Zetia, fish oil and Jardiance. On Plavix for TIA/CAD.=> Okay to hold 5 to 7 days preop for surgeries or procedures.      Sinus bradycardia (Chronic)    We had to back off on beta-blocker because of slow heart rates but is also had a history of tachycardia.  Stable at this point.  Did not have significant bradycardia on monitor in 2022.  Probably had more chronotropic incompetence and fatigue related to high-dose beta-blocker as opposed to bradycardia.      Paroxysmal tachycardia (HCC) (Chronic)    Not overly symptomatic.  Continue Toprol.  Monitor showed frequent little bursts of PAT but nothing prolonged.  Longest episode was 61 beats lasting just over 21 seconds.      Relevant Orders   EKG 12-Lead (Completed)   Mild aortic insufficiency (Chronic)    Very soft aortic murmur.  Would probably hold off on further evaluation      Dyslipidemia, goal LDL below 70 (Chronic)    Unfortunately not we have a good list of his labs but he is on 40 mg rosuvastatin, 10 mg Zetia and fish oil tablets.  Defer to PCP.      AF (atrial fibrillation) -- Post Operative (Chronic)    Initial A-fib postop from CABG, but has not had any breakthrough spells as far as I can tell.  No  recurrence on Zio patch monitor.  Is on Plavix as opposed to DOAC.  Monitor closely.  Continue Toprol 25 mg daily.      ===================================  HPI:    Tanner [name] is a 89 y.o. male with a PMH below who presents today for 75-month follow-up at the request of Deland Pretty, MD.  PMH:  2 V CAD 1991-ACS-BMS PCI of the LAD February 2012: Class IV angina -> two-vessel CAD (diffuse RCA and 80/90 percent ISR in LAD along with occluded D1) => CABG x2 (LIMA-LAD and SVG-RPDA); POST-OP AFIB (no recurrence) Myoview June 2015: Small apical anterolateral defect consistent with known occlusion of diagonal fills via collaterals.  Medical management. April 2015-TIA CRFs: HTN, HLD  I last saw [name] back in October 2022 [name] was last seen in September 2023 by Almyra Deforest, PA -> as a routine annual follow-up.  Accompanied by his nephew Barnabas Lister).  Noted there is doing well over the past year without any exertional chest pain or worsening exertional dyspnea.  Able to walk back and forth to the cafeteria without any issues (10-minute walk in [place]  [place]).  No CHF symptoms. Was evaluated for bladder procedure-cleared for holding Plavix for 5 days.   Recent Hospitalizations:  06/11/22: TURBIT/TURP 12/5 - stubbed toe on walker & tripped  12/24 - lost footing getting out of bed.  Reviewed  CV studies:    The following studies were reviewed today: (if available, images/films reviewed: From Epic Chart or Care Everywhere) Event Monitor 07/2021:  Predominant underlying rhythm Sinus Rhythm with first-degree AV block: Minimum heart rate 53 bpm, maximum 132 bpm, average 80 bpm.  Occasional isolated PACs (1.8%), with rare couplets and triplets.  Occasional isolated PVCs (3.8%) with rare couplets and triplets. Also 5 sec ventricular bigeminy and ~1:12 min trigeminy noted  Frequent (549) bursts of PSVT/PAT (paroxysmal supraventricular-atrial tachycardia)  Fastest interval of  PSVT/PAT lasted 21.4 seconds (61 beats) with a max heart rate of 200 bpm.  PSVT/PAT episode with fastest average heart rate 1:27 min (266 beats) -> average heart rate 184 bpm.  Longest PSVT/PAT interval was 4:09 min with an average rate of 102 bpm. (425 beats)  1 very short episode of intermittent idioventricular rhythm also noted at 5:30 AM..  No events triggered or noted on diary.  Minimum heart rate of 53 bpm sinus rhythm at 1:45 AM. Maximum sinus heart rate 132 bpm was 5:12 PM   Monitor confirms quite frequent bursts of PSVT or PAT with pretty fast heart rates lasting up to 4 minutes.  Probably not enough to cause any adverse events, but may be notable on symptoms. No significant bradycardia noted which was the reason for him to monitor.  Would probably consider low-dose AV nodal agent if symptomatic with PSVT/PAT.  Interval History:   [name] presents here today for follow-up somewhat slowed with all of the treatments for his bladder cancer.  But he really has no cardiac symptoms.  He has some shortness of breath if he overdoes it with activity.  It is really exertional, but not with routine activity.  There is no associated chest pain or pressure.  Besides some mild pedal edema, no PND or orthopnea.  Try to stay active but is stopped more because of just being tired and worn out.  He overdoes things.  Feeling pretty well for a almost 89 year old  CV Review of Symptoms (Summary): positive for - dyspnea on exertion and mild ankle swelling negative for - chest pain, irregular heartbeat, murmur, orthopnea, palpitations, paroxysmal nocturnal dyspnea, rapid heart rate, shortness of breath, or syncope or near syncope, TIA or amaurosis fugax, claudication  REVIEWED OF SYSTEMS   Review of Systems  Constitutional:  Negative for malaise/fatigue (Need to be pretty worn out from his cancer treatments.).  HENT:  Negative for congestion and nosebleeds.   Respiratory:  Negative for  shortness of breath.   Cardiovascular:  Negative for chest pain and leg swelling.  Gastrointestinal:  Negative for blood in stool and constipation.  Genitourinary:  Negative for flank pain and hematuria.  Musculoskeletal:  Positive for back pain and falls (Noted in hospitalizations section.). Negative for joint pain.  Neurological:  Positive for dizziness (Maybe if he stands up too fastAlso has relative for initial first). Negative for speech change and focal weakness.  Psychiatric/Behavioral: Negative.      I have reviewed and (if needed) personally updated the patient's problem list, medications, allergies, past medical and surgical history, social and family history.   PAST MEDICAL HISTORY   Past Medical History:  Diagnosis Date   Anticoagulant long-term use    plavix  (for hx TIA)   Arthritis    right shoulder    Benign essential tremor    hands -- occasional   CAD S/P percutaneous coronary angioplasty cardiologist-- dr Jaion Lagrange   a) PCI and BMS to LAD  in 1991; b) CATH 10/2010: Severe 2 Vessel disease = diffuse RCA ( 75% prox, 90% mid & 70 + 75% distal) & LAD (80-90% pre-stent, 75% post-stent) with occluded D1 (filled via OM-D1 collaterals)  -- CABG x 2; c) Myoview 4/'15: INTERMEDIATE RISK - small sized reversible apical/anterolateral defect -- most likely c/w known occlusion of D1 with collaterals.(med Rx)   History of basal cell carcinoma (BCC) excision    History of bladder cancer urologist-- dr eskridge   dx 11/ 2015-- s/p TURBT   History of kidney stones    History of TIA (transient ischemic attack) 12/09/2013   History of urethral stricture    Hyperlipidemia with target LDL less than 70    controlled.   Neuropathy of lower extremity    Nocturia    Osteoporosis    Peripheral neuropathy    lower extremity   Postoperative atrial fibrillation ([place]) 10/2010   after bypass-"shocked back in and no problems since":   per cardiologist note, dr Ellyn Hack, no recurrence    Pre-diabetes    S/P CABG x 2 10/18/2010   LIMA-LAD, SVG to RPDA (Dr. [name])   [place] (transient global amnesia)    "x3 for hours then goes away"   Transient Dilated idiopathic cardiomyopathy April 2015; 04/2014   a) in setting of TIA: EF 35-40% (reduced from 55-65 percent) mild AI, moderate and dilated aorta.;; b) Recheck Echo 04/2014: EF 55-60%, Gr 1 DD, mild-mod AI.     Wears glasses    Wears partial dentures    upper    PAST SURGICAL HISTORY   Past Surgical History:  Procedure Laterality Date   APPENDECTOMY  as teen   BASAL CELL CARCINOMA EXCISION  07/2014   CARDIAC CATHETERIZATION  10/17/2010   80%prox w/mild in-stent restenosis w/75% mid-LADstenosis aft the stent,multi severe stenoses RCAw/heavily calcified vessel,norm LV   CARDIOVERSION  2012   after bypass surgery   CATARACT EXTRACTION W/ INTRAOCULAR LENS  IMPLANT, BILATERAL  2009  approx.   COCCYX REMOVAL     CORONARY ANGIOPLASTY WITH STENT PLACEMENT  1991   PCI and BMS to LAD   CORONARY ARTERY BYPASS GRAFT  10-18-2010  dr Ricard Dillon @[facility]    LIMA-LAD, SVG-rPDA   CYSTOSCOPY W/ RETROGRADES N/A 07/12/2014   Procedure: CYSTOSCOPY WITH RETROGRADE PYELOGRAM;  Surgeon: [name], MD;  Location: [facility];  Service: Urology;  Laterality: N/A;   CYSTOSCOPY WITH BIOPSY N/A 09/09/2014   Procedure: CYSTO WITH BIOPSY AND FULGERATION, DILATION OF URETHRAL STRICTURE;  Surgeon: [name], MD;  Location: [facility];  Service: Urology;  Laterality: N/A;  BLADDER BIOPSY   CYSTOSCOPY WITH BIOPSY N/A 01/03/2015   Procedure: CYSTOSCOPY WITH BIOPSY;  Surgeon: [name], MD;  Location: [facility];  Service: Urology;  Laterality: N/A;   CYSTOSCOPY WITH BIOPSY N/A 01/26/2019   Procedure: CYSTOSCOPY WITH BIOPSY, BILATERAL RETROGRADE PYELOGRAM;  Surgeon: [name], MD;  Location[facility];  Service: Urology;  Laterality: N/A;   CYSTOSCOPY WITH FULGERATION N/A 01/26/2019   Procedure: CYSTOSCOPY WITH FULGERATION;  Surgeon: [name], MD;  Location[facility];  Service: Urology;  Laterality: N/A;   CYSTOSCOPY WITH URETHRAL DILATATION N/A 01/03/2015   Procedure: CYSTOSCOPY WITH URETHRAL DILATATION;  Surgeon: [name], MD;  Location: [facility];  Service: Urology;  Laterality: N/A;   DOPPLER ECHOCARDIOGRAPHY  10/18/2004   EF 55-65%,BORDERLINE -enlarged aortic root,mild aortic insuff.,trace tricus.insuff.   FINGER SURGERY Left 03-17-2001   dr Burney Gauze  @[facility]    repair complex laceration injury  5th digit   KIDNEY STONE SURGERY  1989   NM MYOVIEW LTD  12/16/2013   INTERMEDIATE RISK test with small sized reversible perfusion defect in the apical anterolateral wall -- most likely consistent with known occlusion of D1 with collaterals.   shoulder surgery Right 1988   TONSILLECTOMY  as teen   TRANSTHORACIC ECHOCARDIOGRAM  April 2015   EF 35-40%, mild AI, moderately dilated ascending aorta   TRANSURETHRAL RESECTION OF BLADDER TUMOR Right 06/11/2022   Procedure: TRANSURETHRAL RESECTION OF BLADDER TUMOR (TURBT)  >5cm;  Surgeon: [name], MD;  Location: [facility];  Service: Urology;  Laterality: Right;   TRANSURETHRAL RESECTION OF PROSTATE N/A 07/12/2014   Procedure: TRANSURETHRAL RESECTION OF THE PROSTATE (TURP) WITH MITOMYCIN -C;  Surgeon: [name], MD;  Location: [facility];  Service: Urology;  Laterality: N/A;   TRANSURETHRAL RESECTION OF PROSTATE N/A 06/11/2022   Procedure: TRANSURETHRAL RESECTION OF THE PROSTATE (TURP);  Surgeon: [name], MD;  Location: [facility];  Service: Urology;  Laterality: N/A;  REQUESTING 2 HRS   MEDICATIONS/ALLERGIES   None current Meds  Medication Sig   acetaminophen (TYLENOL) 500 MG tablet Take 1,000 mg by mouth every 6 (six) hours as needed for mild pain.   Cholecalciferol (VITAMIN D3) 50 MCG (2000 UT) CAPS Take 2,000 Units by mouth daily.   clopidogrel (PLAVIX) 75 MG tablet Take 1 tablet (75 mg total) by mouth daily. Schedule an appointment for further  refills   Cyanocobalamin (VITAMIN B12 PO) Place 5,000 mcg under the tongue daily.   denosumab (PROLIA) 60 MG/ML SOSY injection Inject 60 mg into the skin every 6 (six) months.   ezetimibe (ZETIA) 10 MG tablet Take 10 mg by mouth daily.   fish oil-omega-3 fatty acids 1000 MG capsule Take 1 g by mouth daily.    gabapentin (NEURONTIN) 100 MG capsule Take 100 mg by mouth 2 (two) times daily.   JARDIANCE 10 MG TABS tablet Take 10 mg by mouth daily.   latanoprost (XALATAN) 0.005 % ophthalmic solution Place 1 drop into both eyes at bedtime.   metoprolol succinate (TOPROL-XL) 25 MG 24 hr tablet TAKE 1/2 TABLET BY MOUTH DAILY (Patient taking differently: Take 12.5 mg by mouth daily.)   Multiple Vitamin (MULTIVITAMIN) capsule Take 1 capsule by mouth at bedtime.    rosuvastatin (CRESTOR) 40 MG tablet TAKE 1 TABLET BY MOUTH DAILY (Patient taking differently: Take 40 mg by mouth daily.)   timolol (TIMOPTIC) 0.5 % ophthalmic solution Place 1 drop into both eyes every morning.    No Known Allergies  SOCIAL HISTORY/FAMILY HISTORY   Reviewed in Epic:  Pertinent findings:  Social History   Tobacco Use   Smoking status: Former    Packs/day: 1.00    Years: 5.00    Additional pack years: 0.00    Total pack years: 5.00    Types: Cigarettes    Quit date: 09/02/1957    Years since quitting: 65.2   Smokeless tobacco: Never  Vaping Use   Vaping Use: Never used  Substance Use Topics   Alcohol use: Never    Alcohol/week: 0.0 standard drinks of alcohol   Drug use: Never   Social History   Social History Narrative   Pt lives at home with spouse.   Caffeine Use: very little      He stopped doing the [employer] building about 6 months ago, indicating that he just has not had the same out of energy.  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  He says that now he can only do about 2 and half hours worth of work before you stop and rest.    OBJCTIVE -PE, EKG, labs    Wt Readings from Last 3 Encounters:  11/04/22 157 lb (71.2 kg)  08/25/22 139 lb 15.9 oz (63.5 kg)  08/06/22 140 lb (63.5 kg)    Physical Exam: BP (!) 141/70   Pulse 63   Ht 5\' 9"  (1.753 m)   Wt 157 lb (71.2 kg)   SpO2 95%   BMI 23.18 kg/m  Physical Exam Vitals reviewed.  Constitutional:      General: He is not in acute distress.    Appearance: Normal appearance. He is normal weight. He is not ill-appearing (Seems to be pretty robust for his age but that is still somewhat frail and elderly appearing.) or toxic-appearing.     Comments: Well-groomed.  HENT:     Head: Normocephalic and atraumatic.     Ears:     Comments: Quite hard of hearing Neck:     Vascular: No carotid bruit or JVD.  Cardiovascular:     Rate and Rhythm: Normal rate. Rhythm irregular. Occasional Extrasystoles are present.    Chest Wall: PMI is not displaced.     Pulses: Normal pulses.     Heart sounds: S1 normal and S2 normal. Murmur (Soft 1/6 SEM at RUSB --> neck.) heard.     No friction rub. No gallop.  Pulmonary:     Effort: Pulmonary effort is normal. No respiratory distress.     Breath sounds: Normal breath sounds. No wheezing, rhonchi or rales.  Chest:     Chest wall: No tenderness.  Musculoskeletal:        General: Deformity (Right foot drop) present. No swelling or tenderness.     Cervical back: Normal range of motion and neck supple.  Neurological:     General: No focal deficit present.     Mental Status: He is alert and oriented to person, place, and time. Mental status is at baseline.     Gait: Gait abnormal (Slow, loping with foot drop).  Psychiatric:        Mood and Affect: Mood normal.        Behavior: Behavior normal.        Thought Content: Thought content normal.        Judgment: Judgment normal.     Adult ECG Report  Rate: 63;  Rhythm: normal sinus rhythm, premature atrial contractions (PAC), and 1  AVB ; ST and T wave abnormalities/T wave inversions cannot exclude ischemia.   Narrative Interpretation: Stable  Recent Labs:   01/04/2021: TC 255, HDL 52; 01/10/2022 TG 145 -> do not have LDL from Previous labs. Lab Results  Component Value Date   CHOL 131 12/10/2013   HDL 51 12/10/2013   LDLCALC 60 12/10/2013   TRIG 53 03/23/2019   CHOLHDL 2.6 12/10/2013   Lab Results  Component Value Date   CREATININE 0.86 08/25/2022   BUN 25 (H) 08/25/2022   NA 137 08/25/2022   K 4.3 08/25/2022   CL 104 08/25/2022   CO2 25 08/25/2022      Latest Ref Rng & Units 08/25/2022    6:48 PM 08/06/2022    7:47 PM 08/06/2022    7:33 PM  CBC  WBC 4.0 - 10.5 K/uL 5.2   4.0   Hemoglobin 13.0 - 17.0 g/dL 13.0  12.9  13.1   Hematocrit 39.0 -  52.0 % 40.8  38.0  39.5   Platelets 150 - 400 K/uL 161   125     Lab Results  Component Value Date   HGBA1C 6.3 (H) 06/05/2022   Lab Results  Component Value Date   TSH 0.885 03/23/2019    ================================================== I spent a total of 16 minutes with the patient spent in direct patient consultation.  Additional time spent with chart review  / charting (studies, outside notes, etc): 15 min Total Time: 31 min  Current medicines are reviewed at length with the patient today.  (+/- concerns) N/A  Notice: This dictation was prepared with Dragon dictation along with smart phrase technology. Any transcriptional errors that result from this process are unintentional and may not be corrected upon review.  Studies Ordered:   Orders Placed This Encounter  Procedures   EKG 12-Lead   No orders of the defined types were placed in this encounter.   Patient Instructions / Medication Changes & Studies & Tests Ordered   Patient Instructions  Medication Instructions:   No changes   *If you need a refill on your cardiac medications before your next appointment, please call your pharmacy*   Lab Work: Not needed .   Testing/Procedures: Not needed   Follow-Up: At [facility], you and your health needs are  our priority.  As part of our continuing mission to provide you with exceptional heart care, we have created designated Provider Care Teams.  These Care Teams include your primary Cardiologist (physician) and Advanced Practice Providers (APPs -  Physician Assistants and Nurse Practitioners) who all work together to provide you with the care you need, when you need it.     Your next appointment:   12 month(s)  The format for your next appointment:   In Person  Provider:   Glenetta Hew, MD         [name], MD, MS Glenetta Hew, M.D., M.S. Interventional Cardiologist  [place]  Pager # [phone] Phone # [phone] [address] [place], [place] [address]   Thank you for choosing [facility]!!

## 2022-11-16 ENCOUNTER — Encounter: Payer: Self-pay | Admitting: Cardiology

## 2022-11-16 NOTE — Assessment & Plan Note (Addendum)
We had to back off on beta-blocker because of slow heart rates but is also had a history of tachycardia.  Stable at this point.  Did not have significant bradycardia on monitor in 2022.  Probably had more chronotropic incompetence and fatigue related to high-dose beta-blocker as opposed to bradycardia.

## 2022-11-16 NOTE — Assessment & Plan Note (Signed)
Initial A-fib postop from CABG, but has not had any breakthrough spells as far as I can tell.  No recurrence on Zio patch monitor.  Is on Plavix as opposed to DOAC.  Monitor closely.  Continue Toprol 25 mg daily.

## 2022-11-16 NOTE — Assessment & Plan Note (Signed)
Unfortunately not we have a good list of his labs but he is on 40 mg rosuvastatin, 10 mg Zetia and fish oil tablets.  Defer to PCP.

## 2022-11-16 NOTE — Assessment & Plan Note (Signed)
Very soft aortic murmur.  Would probably hold off on further evaluation

## 2022-11-16 NOTE — Assessment & Plan Note (Addendum)
Not overly symptomatic.  Continue Toprol.  Monitor showed frequent little bursts of PAT but nothing prolonged.  Longest episode was 61 beats lasting just over 21 seconds.

## 2022-11-16 NOTE — Assessment & Plan Note (Signed)
Doing well with no active anginal symptoms.  Per discussion, we will hold off on any ischemic evaluation unless he were to have symptoms.  I do not think there would be a good option to consider invasive evaluation, despite the results.  Continue Toprol along with rosuvastatin, Zetia, fish oil and Jardiance. On Plavix for TIA/CAD.=> Okay to hold 5 to 7 days preop for surgeries or procedures.

## 2022-12-30 ENCOUNTER — Encounter: Payer: Self-pay | Admitting: Adult Health

## 2022-12-30 NOTE — Progress Notes (Signed)
This encounter was created in error - please disregard.

## 2023-01-01 ENCOUNTER — Encounter: Payer: Self-pay | Admitting: Nurse Practitioner

## 2023-01-01 ENCOUNTER — Non-Acute Institutional Stay (SKILLED_NURSING_FACILITY): Payer: Medicare Other | Admitting: Nurse Practitioner

## 2023-01-01 DIAGNOSIS — E785 Hyperlipidemia, unspecified: Secondary | ICD-10-CM

## 2023-01-01 DIAGNOSIS — E538 Deficiency of other specified B group vitamins: Secondary | ICD-10-CM | POA: Diagnosis not present

## 2023-01-01 DIAGNOSIS — G609 Hereditary and idiopathic neuropathy, unspecified: Secondary | ICD-10-CM

## 2023-01-01 DIAGNOSIS — I479 Paroxysmal tachycardia, unspecified: Secondary | ICD-10-CM

## 2023-01-01 DIAGNOSIS — R7303 Prediabetes: Secondary | ICD-10-CM

## 2023-01-01 DIAGNOSIS — E559 Vitamin D deficiency, unspecified: Secondary | ICD-10-CM

## 2023-01-01 DIAGNOSIS — R350 Frequency of micturition: Secondary | ICD-10-CM

## 2023-01-01 DIAGNOSIS — I2510 Atherosclerotic heart disease of native coronary artery without angina pectoris: Secondary | ICD-10-CM | POA: Diagnosis not present

## 2023-01-01 DIAGNOSIS — H409 Unspecified glaucoma: Secondary | ICD-10-CM

## 2023-01-01 DIAGNOSIS — E1165 Type 2 diabetes mellitus with hyperglycemia: Secondary | ICD-10-CM

## 2023-01-01 NOTE — Assessment & Plan Note (Signed)
on Zetia, Fish oil, Rosuvastatin, update lipid panel.

## 2023-01-01 NOTE — Assessment & Plan Note (Signed)
on Vit D3, update Vit D level.

## 2023-01-01 NOTE — Assessment & Plan Note (Addendum)
GABG x2, post Op afib, takes Plavix, Metoprolol, Jardiance, and statin, update CBC/diff, CMP/eGFR, TSH

## 2023-01-01 NOTE — Assessment & Plan Note (Signed)
Heart rate is in control, continue Metoprolol  

## 2023-01-01 NOTE — Assessment & Plan Note (Signed)
on Gabapentin, ambulates with walker.

## 2023-01-01 NOTE — Assessment & Plan Note (Signed)
wear glasses, on Timolol, Latanoprost

## 2023-01-01 NOTE — Progress Notes (Addendum)
Location:   [facility] Room Number: 926 Place of Service:  ALF (13) Provider: Arna Snipe Makiah Clauson NP  [name], MD  Patient Care Team: [name], MD as PCP - General (Internal Medicine) [name], MD as PCP - Cardiology (Cardiology)  Extended Emergency Contact Information Primary Emergency Contact: [name],[name] Mobile Phone: [phone] Relation: Nephew Preferred language: English Interpreter needed? No  Code Status: DNR Goals of care: Advanced Directive information    12/30/2022   10:13 AM  Advanced Directives  Does Patient Have a Medical Advance Directive? Yes  Type of Advance Directive Out of facility DNR (pink MOST or yellow form)  Would patient like information on creating a medical advance directive? No - Patient declined     Chief Complaint  Patient presents with   Acute Visit    Medication review    HPI:  Pt is a 89 y.o. male seen today for an acute visit for new patient establishment.   Urinary frequency, Hx of bladder neoplasm,  2-3x nocturnal urination, no difficulty returning asleep, on Tamsulosin.   CAD, GABG x2, post Op afib, takes Plavix, Metoprolol, Jardiance, and statin  Vitamin D deficiency, on Vit D3  Vit B12 deficiency, on Vit B12  Peripheral neuropathy, on Gabapentin, ambulates with walker.  Glaucoma, wear glasses, on Timolol, Latanoprost HLD on Zetia, Fish oil, Rosuvastatin Paroxysmal tachycardia, on Metoprolol   Prediabetes, diet control.     Past Medical History:  Diagnosis Date   Anticoagulant long-term use    plavix  (for hx TIA)   Arthritis    right shoulder    Benign essential tremor    hands -- occasional   CAD S/P percutaneous coronary angioplasty cardiologist-- dr harding   a) PCI and BMS to LAD in 1991; b) CATH 10/2010: Severe 2 Vessel disease = diffuse RCA ( 75% prox, 90% mid & 70 + 75% distal) & LAD (80-90% pre-stent, 75% post-stent) with occluded D1 (filled via OM-D1 collaterals)  -- CABG x 2; c) Myoview  4/'15: INTERMEDIATE RISK - small sized reversible apical/anterolateral defect -- most likely c/w known occlusion of D1 with collaterals.(med Rx)   History of basal cell carcinoma (BCC) excision    History of bladder cancer urologist-- dr eskridge   dx 11/ 2015-- s/p TURBT   History of kidney stones    History of TIA (transient ischemic attack) 12/09/2013   History of urethral stricture    Hyperlipidemia with target LDL less than 70    controlled.   Neuropathy of lower extremity    Nocturia    Osteoporosis    Peripheral neuropathy    lower extremity   Postoperative atrial fibrillation (HCC) 10/2010   after bypass-"shocked back in and no problems since":   per cardiologist note, [name], no recurrence   Pre-diabetes    S/P CABG x 2 10/18/2010   LIMA-LAD, SVG to RPDA (Dr. Cornelius Moras)   TGA (transient global amnesia)    "x3 for hours then goes away"   Transient Dilated idiopathic cardiomyopathy April 2015; 04/2014   a) in setting of TIA: EF 35-40% (reduced from 55-65 percent) mild AI, moderate and dilated aorta.;; b) Recheck Echo 04/2014: EF 55-60%, Gr 1 DD, mild-mod AI.     Wears glasses    Wears partial dentures    upper   Past Surgical History:  Procedure Laterality Date   APPENDECTOMY  as teen   BASAL CELL CARCINOMA EXCISION  07/2014   CARDIAC CATHETERIZATION  10/17/2010   80%prox w/mild in-stent  restenosis w/75% mid-LADstenosis aft the stent,multi severe stenoses RCAw/heavily calcified vessel,norm LV   CARDIOVERSION  2012   after bypass surgery   CATARACT EXTRACTION W/ INTRAOCULAR LENS  IMPLANT, BILATERAL  2009  approx.   COCCYX REMOVAL     CORONARY ANGIOPLASTY WITH STENT PLACEMENT  1991   PCI and BMS to LAD   CORONARY ARTERY BYPASS GRAFT  10-18-2010  [name] @[facility]    LIMA-LAD, SVG-rPDA   CYSTOSCOPY W/ RETROGRADES N/A 07/12/2014   Procedure: CYSTOSCOPY WITH RETROGRADE PYELOGRAM;  Surgeon: [name], MD;  Location: [facility];  Service: Urology;  Laterality: N/A;   CYSTOSCOPY  WITH BIOPSY N/A 09/09/2014   Procedure: CYSTO WITH BIOPSY AND FULGERATION, DILATION OF URETHRAL STRICTURE;  Surgeon: [name], MD;  Location: [facility];  Service: Urology;  Laterality: N/A;  BLADDER BIOPSY   CYSTOSCOPY WITH BIOPSY N/A 01/03/2015   Procedure: CYSTOSCOPY WITH BIOPSY;  Surgeon: [name], MD;  Location: [facility];  Service: Urology;  Laterality: N/A;   CYSTOSCOPY WITH BIOPSY N/A 01/26/2019   Procedure: CYSTOSCOPY WITH BIOPSY, BILATERAL RETROGRADE PYELOGRAM;  Surgeon: [name], MD;  Location: [facility];  Service: Urology;  Laterality: N/A;   CYSTOSCOPY WITH FULGERATION N/A 01/26/2019   Procedure: CYSTOSCOPY WITH FULGERATION;  Surgeon: [name], MD;  Location: [facility];  Service: Urology;  Laterality: N/A;   CYSTOSCOPY WITH URETHRAL DILATATION N/A 01/03/2015   Procedure: CYSTOSCOPY WITH URETHRAL DILATATION;  Surgeon: [name], MD;  Location: [facility];  Service: Urology;  Laterality: N/A;   DOPPLER ECHOCARDIOGRAPHY  10/18/2004   EF 55-65%,BORDERLINE -enlarged aortic root,mild aortic insuff.,trace tricus.insuff.   FINGER SURGERY Left 03-17-2001   dr [name]  @[facility]    repair complex laceration injury 5th digit   KIDNEY STONE SURGERY  1989   NM MYOVIEW LTD  12/16/2013   INTERMEDIATE RISK test with small sized reversible perfusion defect in the apical anterolateral wall -- most likely consistent with known occlusion of D1 with collaterals.   shoulder surgery Right 1988   TONSILLECTOMY  as teen   TRANSTHORACIC ECHOCARDIOGRAM  April 2015   EF 35-40%, mild AI, moderately dilated ascending aorta   TRANSURETHRAL RESECTION OF BLADDER TUMOR Right 06/11/2022   Procedure: TRANSURETHRAL RESECTION OF BLADDER TUMOR (TURBT)  >5cm;  Surgeon: [name], MD;  Location: [facility];  Service: Urology;  Laterality: Right;   TRANSURETHRAL RESECTION OF PROSTATE N/A 07/12/2014   Procedure: TRANSURETHRAL RESECTION OF THE PROSTATE (TURP) WITH  MITOMYCIN -C;  Surgeon: [name], MD;  Location: [facility];  Service: Urology;  Laterality: N/A;   TRANSURETHRAL RESECTION OF PROSTATE N/A 06/11/2022   Procedure: TRANSURETHRAL RESECTION OF THE PROSTATE (TURP);  Surgeon: [name], MD;  Location: [facility];  Service: Urology;  Laterality: N/A;  REQUESTING 2 HRS    No Known Allergies  Allergies as of 01/01/2023   No Known Allergies      Medication List        Accurate as of Jan 01, 2023 11:59 PM. If you have any questions, ask your nurse or doctor.          acetaminophen 500 MG tablet Commonly known as: TYLENOL Take 1,000 mg by mouth every 6 (six) hours as needed for mild pain.   clopidogrel 75 MG tablet Commonly known as: PLAVIX Take 1 tablet (75 mg total) by mouth daily. Schedule an appointment for further refills   ezetimibe 10 MG tablet Commonly known as: ZETIA Take 10 mg by mouth daily.   fish oil-omega-3 fatty  acids 1000 MG capsule Take 1 g by mouth daily.   gabapentin 100 MG capsule Commonly known as: NEURONTIN Take 100 mg by mouth 2 (two) times daily.   Jardiance 10 MG Tabs tablet Generic drug: empagliflozin Take 10 mg by mouth daily.   latanoprost 0.005 % ophthalmic solution Commonly known as: XALATAN Place 1 drop into both eyes at bedtime.   metoprolol succinate 25 MG 24 hr tablet Commonly known as: TOPROL-XL Take 12.5 mg by mouth daily.   multivitamin capsule Take 1 capsule by mouth at bedtime.   Prolia 60 MG/ML Sosy injection Generic drug: denosumab Inject 60 mg into the skin every 6 (six) months.   rosuvastatin 40 MG tablet Commonly known as: CRESTOR TAKE 1 TABLET BY MOUTH DAILY   tamsulosin 0.4 MG Caps capsule Commonly known as: FLOMAX Take 0.4 mg by mouth at bedtime.   timolol 0.5 % ophthalmic solution Commonly known as: TIMOPTIC Place 1 drop into both eyes every morning.   VITAMIN B12 PO Place 5,000 mcg under the tongue daily.   vitamin D3 50 MCG (2000 UT) Caps Take  2,000 Units by mouth daily.        Review of Systems  Constitutional:  Negative for appetite change, fatigue and fever.  HENT:  Positive for hearing loss. Negative for congestion and trouble swallowing.   Eyes:  Positive for visual disturbance.       Low vision  Respiratory:  Negative for cough and wheezing.   Cardiovascular:  Negative for chest pain, palpitations and leg swelling.  Gastrointestinal:  Negative for abdominal pain, constipation, nausea and vomiting.  Genitourinary:  Positive for frequency. Negative for difficulty urinating, hematuria and urgency.  Musculoskeletal:  Positive for arthralgias and gait problem.  Skin:  Negative for color change.  Neurological:  Positive for numbness. Negative for weakness and headaches.       Hx of numbness in legs.   Psychiatric/Behavioral:  Negative for behavioral problems and sleep disturbance. The patient is not nervous/anxious.     Immunization History  Administered Date(s) Administered   Influenza-Unspecified 06/02/2014   Tdap 08/25/2022   Zoster Recombinat (Shingrix) 06/18/2018, 08/28/2018   Pertinent  Health Maintenance Due  Topic Date Due   FOOT EXAM  Never done   OPHTHALMOLOGY EXAM  Never done   HEMOGLOBIN A1C  12/05/2022   INFLUENZA VACCINE  04/03/2023      03/27/2019    9:21 PM 03/28/2019    8:25 AM 03/22/2022   11:09 AM 08/06/2022    9:18 PM 08/25/2022    6:51 PM  Fall Risk  (RETIRED) Patient Fall Risk Level High fall risk High fall risk Moderate fall risk Low fall risk Low fall risk   Functional Status Survey:    Vitals:   01/01/23 1140  BP: 129/71  Pulse: 81  Resp: 14  Temp: (!) 97.1 F (36.2 C)  SpO2: 95%  Weight: 154 lb (69.9 kg)   Body mass index is 22.74 kg/m. Physical Exam Vitals and nursing note reviewed.  Constitutional:      Appearance: Normal appearance.  HENT:     Head: Normocephalic and atraumatic.     Nose: Nose normal.     Mouth/Throat:     Mouth: Mucous membranes are moist.   Eyes:     Extraocular Movements: Extraocular movements intact.     Conjunctiva/sclera: Conjunctivae normal.     Pupils: Pupils are equal, round, and reactive to light.  Cardiovascular:     Rate and Rhythm: Normal rate and  regular rhythm.     Heart sounds: No murmur heard. Pulmonary:     Effort: Pulmonary effort is normal.     Breath sounds: No wheezing, rhonchi or rales.  Abdominal:     General: Bowel sounds are normal.     Palpations: Abdomen is soft.     Tenderness: There is no abdominal tenderness.  Musculoskeletal:     Cervical back: Normal range of motion and neck supple.     Right lower leg: No edema.     Left lower leg: No edema.  Skin:    General: Skin is warm and dry.  Neurological:     General: No focal deficit present.     Mental Status: He is alert and oriented to person, place, and time. Mental status is at baseline.     Motor: No weakness.     Gait: Gait abnormal.  Psychiatric:        Mood and Affect: Mood normal.        Behavior: Behavior normal.        Thought Content: Thought content normal.        Judgment: Judgment normal.     Labs reviewed: Recent Labs    06/05/22 1122 08/06/22 1933 08/06/22 1947 08/25/22 1848  NA 140 135 137 137  K 4.0 4.4 4.4 4.3  CL 110 102 101 104  CO2 25 23  --  25  GLUCOSE 137* 206* 205* 171*  BUN 30* 26* 27* 25*  CREATININE 0.96 0.99 0.90 0.86  CALCIUM 9.7 9.5  --  9.7   Recent Labs    08/06/22 1933  AST 69*  ALT 66*  ALKPHOS 59  BILITOT 1.1  PROT 6.5  ALBUMIN 3.6   Recent Labs    06/05/22 1122 08/06/22 1933 08/06/22 1947 08/25/22 1848  WBC 5.2 4.0  --  5.2  HGB 13.6 13.1 12.9* 13.0  HCT 41.8 39.5 38.0* 40.8  MCV 99.1 98.8  --  100.2*  PLT 144* 125*  --  161   Lab Results  Component Value Date   TSH 0.885 03/23/2019   Lab Results  Component Value Date   HGBA1C 6.3 (H) 06/05/2022   Lab Results  Component Value Date   CHOL 131 12/10/2013   HDL 51 12/10/2013   LDLCALC 60 12/10/2013   TRIG 53  03/23/2019   CHOLHDL 2.6 12/10/2013    Significant Diagnostic Results in last 30 days:  No results found.  Assessment/Plan: Urinary frequency Urinary frequency, Hx of bladder neoplasm,  2-3x nocturnal urination, no difficulty returning asleep, on Tamsulosin.   Two-vessel CAD status post CABG x2;   GABG x2, post Op afib, takes Plavix, Metoprolol, Jardiance, and statin, update CBC/diff, CMP/eGFR, TSH  Vitamin D deficiency  on Vit D3, update Vit D level.   Vitamin B12 deficiency on Vit B12, update Vit B12 level.   Hereditary and idiopathic peripheral neuropathy on Gabapentin, ambulates with walker.   Glaucoma  wear glasses, on Timolol, Latanoprost  Dyslipidemia, goal LDL below 70 on Zetia, Fish oil, Rosuvastatin, update lipid panel.   Paroxysmal tachycardia (HCC) Heart rate is in control, continue Metoprolol   Type 2 diabetes mellitus (HCC) On diet, obtain Hgb A1c.  01/02/23 Hgb A1c 8.2, will start Metformin 500mg  qd with meal, fasting CBG in am     Family/ staff Communication: plan of care reviewed with the patient and charge nurse.   Labs/tests ordered:  CBC/diff, CMP/eGFR, lipid panel, TSH, Hgb A1c, Vit B12, Vit D  Time  spend 45 minutes.

## 2023-01-01 NOTE — Assessment & Plan Note (Signed)
on Vit B12, update Vit B12 level.

## 2023-01-01 NOTE — Assessment & Plan Note (Signed)
Urinary frequency, Hx of bladder neoplasm,  2-3x nocturnal urination, no difficulty returning asleep, on Tamsulosin.

## 2023-01-01 NOTE — Assessment & Plan Note (Addendum)
On diet, obtain Hgb A1c.  01/02/23 Hgb A1c 8.2, will start Metformin 500mg  qd with meal, fasting CBG in am

## 2023-01-10 ENCOUNTER — Encounter: Payer: Self-pay | Admitting: Nurse Practitioner

## 2023-01-10 ENCOUNTER — Non-Acute Institutional Stay: Payer: Medicare Other | Admitting: Nurse Practitioner

## 2023-01-10 DIAGNOSIS — R350 Frequency of micturition: Secondary | ICD-10-CM

## 2023-01-10 DIAGNOSIS — G609 Hereditary and idiopathic neuropathy, unspecified: Secondary | ICD-10-CM

## 2023-01-10 DIAGNOSIS — K056 Periodontal disease, unspecified: Secondary | ICD-10-CM | POA: Diagnosis not present

## 2023-01-10 DIAGNOSIS — H409 Unspecified glaucoma: Secondary | ICD-10-CM

## 2023-01-10 DIAGNOSIS — E538 Deficiency of other specified B group vitamins: Secondary | ICD-10-CM

## 2023-01-10 DIAGNOSIS — I479 Paroxysmal tachycardia, unspecified: Secondary | ICD-10-CM

## 2023-01-10 DIAGNOSIS — I2510 Atherosclerotic heart disease of native coronary artery without angina pectoris: Secondary | ICD-10-CM

## 2023-01-10 DIAGNOSIS — E785 Hyperlipidemia, unspecified: Secondary | ICD-10-CM

## 2023-01-10 DIAGNOSIS — E1165 Type 2 diabetes mellitus with hyperglycemia: Secondary | ICD-10-CM

## 2023-01-10 DIAGNOSIS — E559 Vitamin D deficiency, unspecified: Secondary | ICD-10-CM

## 2023-01-10 NOTE — Assessment & Plan Note (Signed)
Urinary frequency, Hx of bladder neoplasm,  2-3x nocturnal urination, no difficulty returning asleep, on Tamsulosin.  

## 2023-01-10 NOTE — Assessment & Plan Note (Signed)
Hgb A1c 8.2 01/02/23, started Meformin, am fasting CBG 165-279

## 2023-01-10 NOTE — Assessment & Plan Note (Signed)
Paroxysmal tachycardia, on Metoprolol, TSH 2.91

## 2023-01-10 NOTE — Progress Notes (Addendum)
Location:  [facility] Room Number: [id number] Place of Service:  ALF (13) Provider:  Achilles Neville X, NP  Patient Care Team: [name], MD as PCP - General (Internal Medicine) [name], MD as PCP - Cardiology (Cardiology)  Extended Emergency Contact Information Primary Emergency Contact: [name],[name] Mobile Phone: [phone] Relation: Nephew Preferred language: English Interpreter needed? No  Code Status:  DNR Goals of care: Advanced Directive information    12/30/2022   10:13 AM  Advanced Directives  Does Patient Have a Medical Advance Directive? Yes  Type of Advance Directive Out of facility DNR (pink MOST or yellow form)  Would patient like information on creating a medical advance directive? No - Patient declined     Chief Complaint  Patient presents with   Acute Visit    Patient is being seen for sore gums    HPI:  Pt is a 89 y.o. male seen today for an acute visit for right lower gum pain/redness following refitted denture, c/o it hurts to wear dentures, pending dental f/u. No abscess or ulcer noted.    T2DM, Hgb A1c 8.2 01/02/23, started Meformin, am fasting CBG 165-279   Urinary frequency, Hx of bladder neoplasm,  2-3x nocturnal urination, no difficulty returning asleep, on Tamsulosin.              CAD, GABG x2, post Op afib, takes Plavix, Metoprolol, Jardiance, and statin             Vitamin D deficiency, on Vit D3, Vit D 51 01/02/23             Vit B12 deficiency, on Vit B12, Vit B12 877, Hgb 14.1 01/02/23             Peripheral neuropathy, on Gabapentin, ambulates with walker.  Glaucoma, wear glasses, on Timolol, Latanoprost HLD on Zetia, Fish oil, Rosuvastatin, LDL 57 01/02/23 Paroxysmal tachycardia, on Metoprolol, TSH 2.91 01/02/23                         Past Medical History:  Diagnosis Date   Anticoagulant long-term use    plavix  (for hx TIA)   Arthritis    right shoulder    Benign essential tremor    hands -- occasional    CAD S/P percutaneous coronary angioplasty cardiologist-- dr harding   a) PCI and BMS to LAD in 1991; b) CATH 10/2010: Severe 2 Vessel disease = diffuse RCA ( 75% prox, 90% mid & 70 + 75% distal) & LAD (80-90% pre-stent, 75% post-stent) with occluded D1 (filled via OM-D1 collaterals)  -- CABG x 2; c) Myoview 4/'15: INTERMEDIATE RISK - small sized reversible apical/anterolateral defect -- most likely c/w known occlusion of D1 with collaterals.(med Rx)   History of basal cell carcinoma (BCC) excision    History of bladder cancer urologist-- dr eskridge   dx 11/ 2015-- s/p TURBT   History of kidney stones    History of TIA (transient ischemic attack) 12/09/2013   History of urethral stricture    Hyperlipidemia with target LDL less than 70    controlled.   Neuropathy of lower extremity    Nocturia    Osteoporosis    Peripheral neuropathy    lower extremity   Postoperative atrial fibrillation (HCC) 10/2010   after bypass-"shocked back in and no problems since":   per cardiologist note, [name], no recurrence   Pre-diabetes    S/P CABG x 2 10/18/2010  LIMA-LAD, SVG to RPDA (Dr. Cornelius Moras)   TGA (transient global amnesia)    "x3 for hours then goes away"   Transient Dilated idiopathic cardiomyopathy April 2015; 04/2014   a) in setting of TIA: EF 35-40% (reduced from 55-65 percent) mild AI, moderate and dilated aorta.;; b) Recheck Echo 04/2014: EF 55-60%, Gr 1 DD, mild-mod AI.     Wears glasses    Wears partial dentures    upper   Past Surgical History:  Procedure Laterality Date   APPENDECTOMY  as teen   BASAL CELL CARCINOMA EXCISION  07/2014   CARDIAC CATHETERIZATION  10/17/2010   80%prox w/mild in-stent restenosis w/75% mid-LADstenosis aft the stent,multi severe stenoses RCAw/heavily calcified vessel,norm LV   CARDIOVERSION  2012   after bypass surgery   CATARACT EXTRACTION W/ INTRAOCULAR LENS  IMPLANT, BILATERAL  2009  approx.   COCCYX REMOVAL     CORONARY ANGIOPLASTY WITH STENT  PLACEMENT  1991   PCI and BMS to LAD   CORONARY ARTERY BYPASS GRAFT  10-18-2010  [name] @[facility]    LIMA-LAD, SVG-rPDA   CYSTOSCOPY W/ RETROGRADES N/A 07/12/2014   Procedure: CYSTOSCOPY WITH RETROGRADE PYELOGRAM;  Surgeon: [name], MD;  Location: [facility];  Service: Urology;  Laterality: N/A;   CYSTOSCOPY WITH BIOPSY N/A 09/09/2014   Procedure: CYSTO WITH BIOPSY AND FULGERATION, DILATION OF URETHRAL STRICTURE;  Surgeon: [name], MD;  Location: [facility];  Service: Urology;  Laterality: N/A;  BLADDER BIOPSY   CYSTOSCOPY WITH BIOPSY N/A 01/03/2015   Procedure: CYSTOSCOPY WITH BIOPSY;  Surgeon: [name], MD;  Location: [facility];  Service: Urology;  Laterality: N/A;   CYSTOSCOPY WITH BIOPSY N/A 01/26/2019   Procedure: CYSTOSCOPY WITH BIOPSY, BILATERAL RETROGRADE PYELOGRAM;  Surgeon: [name], MD;  Location: [facility];  Service: Urology;  Laterality: N/A;   CYSTOSCOPY WITH FULGERATION N/A 01/26/2019   Procedure: CYSTOSCOPY WITH FULGERATION;  Surgeon: [name], MD;  Location: [facility];  Service: Urology;  Laterality: N/A;   CYSTOSCOPY WITH URETHRAL DILATATION N/A 01/03/2015   Procedure: CYSTOSCOPY WITH URETHRAL DILATATION;  Surgeon: [name], MD;  Location: [facility];  Service: Urology;  Laterality: N/A;   DOPPLER ECHOCARDIOGRAPHY  10/18/2004   EF 55-65%,BORDERLINE -enlarged aortic root,mild aortic insuff.,trace tricus.insuff.   FINGER SURGERY Left 03-17-2001   dr [name]  @[facility]    repair complex laceration injury 5th digit   KIDNEY STONE SURGERY  1989   NM MYOVIEW LTD  12/16/2013   INTERMEDIATE RISK test with small sized reversible perfusion defect in the apical anterolateral wall -- most likely consistent with known occlusion of D1 with collaterals.   shoulder surgery Right 1988   TONSILLECTOMY  as teen   TRANSTHORACIC ECHOCARDIOGRAM  April 2015   EF 35-40%, mild AI, moderately dilated ascending aorta   TRANSURETHRAL RESECTION OF  BLADDER TUMOR Right 06/11/2022   Procedure: TRANSURETHRAL RESECTION OF BLADDER TUMOR (TURBT)  >5cm;  Surgeon: [name], MD;  Location: [facility];  Service: Urology;  Laterality: Right;   TRANSURETHRAL RESECTION OF PROSTATE N/A 07/12/2014   Procedure: TRANSURETHRAL RESECTION OF THE PROSTATE (TURP) WITH MITOMYCIN -C;  Surgeon: [name], MD;  Location: [facility];  Service: Urology;  Laterality: N/A;   TRANSURETHRAL RESECTION OF PROSTATE N/A 06/11/2022   Procedure: TRANSURETHRAL RESECTION OF THE PROSTATE (TURP);  Surgeon: [name], MD;  Location: [facility];  Service: Urology;  Laterality: N/A;  REQUESTING 2 HRS    No Known Allergies  Outpatient Encounter Medications as of 01/10/2023  Medication  Sig   acetaminophen (TYLENOL) 500 MG tablet Take 1,000 mg by mouth every 6 (six) hours as needed for mild pain.   Cholecalciferol (VITAMIN D3) 50 MCG (2000 UT) CAPS Take 2,000 Units by mouth daily.   clopidogrel (PLAVIX) 75 MG tablet Take 1 tablet (75 mg total) by mouth daily. Schedule an appointment for further refills   Cyanocobalamin (VITAMIN B12 PO) Place 5,000 mcg under the tongue daily.   denosumab (PROLIA) 60 MG/ML SOSY injection Inject 60 mg into the skin every 6 (six) months.   ezetimibe (ZETIA) 10 MG tablet Take 10 mg by mouth daily.   fish oil-omega-3 fatty acids 1000 MG capsule Take 1 g by mouth daily.    gabapentin (NEURONTIN) 100 MG capsule Take 100 mg by mouth 2 (two) times daily.   JARDIANCE 10 MG TABS tablet Take 10 mg by mouth daily.   latanoprost (XALATAN) 0.005 % ophthalmic solution Place 1 drop into both eyes at bedtime.   metoprolol succinate (TOPROL-XL) 25 MG 24 hr tablet Take 12.5 mg by mouth daily.   Multiple Vitamin (MULTIVITAMIN) capsule Take 1 capsule by mouth at bedtime.    rosuvastatin (CRESTOR) 40 MG tablet TAKE 1 TABLET BY MOUTH DAILY   tamsulosin (FLOMAX) 0.4 MG CAPS capsule Take 0.4 mg by mouth at bedtime.   timolol (TIMOPTIC) 0.5 % ophthalmic solution  Place 1 drop into both eyes every morning.   Facility-Administered Encounter Medications as of 01/10/2023  Medication   gemcitabine (GEMZAR) chemo syringe for bladder instillation 2,000 mg    Review of Systems  Constitutional:  Negative for appetite change, fatigue and fever.  HENT:  Positive for dental problem and hearing loss. Negative for congestion and trouble swallowing.   Eyes:  Positive for visual disturbance.       Low vision  Respiratory:  Negative for cough and wheezing.   Cardiovascular:  Positive for leg swelling. Negative for chest pain and palpitations.  Gastrointestinal:  Negative for abdominal pain, constipation, nausea and vomiting.  Genitourinary:  Positive for frequency. Negative for difficulty urinating, hematuria and urgency.  Musculoskeletal:  Positive for arthralgias and gait problem.  Skin:  Negative for color change.  Neurological:  Positive for numbness. Negative for weakness and headaches.       Hx of numbness in legs.   Psychiatric/Behavioral:  Negative for behavioral problems and sleep disturbance. The patient is not nervous/anxious.     Immunization History  Administered Date(s) Administered   Influenza-Unspecified 06/02/2014   Tdap 08/25/2022   Zoster Recombinat (Shingrix) 06/18/2018, 08/28/2018   Pertinent  Health Maintenance Due  Topic Date Due   FOOT EXAM  Never done   OPHTHALMOLOGY EXAM  Never done   HEMOGLOBIN A1C  12/05/2022   INFLUENZA VACCINE  04/03/2023      03/27/2019    9:21 PM 03/28/2019    8:25 AM 03/22/2022   11:09 AM 08/06/2022    9:18 PM 08/25/2022    6:51 PM  Fall Risk  (RETIRED) Patient Fall Risk Level High fall risk High fall risk Moderate fall risk Low fall risk Low fall risk   Functional Status Survey:    Vitals:   01/10/23 1201  BP: 129/71  Pulse: 81  Resp: 14  Temp: (!) 97.1 F (36.2 C)  SpO2: 95%  Weight: 154 lb 6.4 oz (70 kg)  Height: 5\' 9"  (1.753 m)   Body mass index is 22.8 kg/m. Physical Exam Vitals  and nursing note reviewed.  Constitutional:      Appearance: Normal  appearance.  HENT:     Head: Normocephalic and atraumatic.     Nose: Nose normal.     Mouth/Throat:     Mouth: Mucous membranes are moist.     Comments:  right lower gum pain/redness following refitted denture, c/o it hurts to wear dentures. No abscess or ulcer noted.    Eyes:     Extraocular Movements: Extraocular movements intact.     Conjunctiva/sclera: Conjunctivae normal.     Pupils: Pupils are equal, round, and reactive to light.  Cardiovascular:     Rate and Rhythm: Normal rate and regular rhythm.     Heart sounds: No murmur heard. Pulmonary:     Effort: Pulmonary effort is normal.     Breath sounds: No rales.  Abdominal:     General: Bowel sounds are normal.     Palpations: Abdomen is soft.     Tenderness: There is no abdominal tenderness.  Musculoskeletal:     Cervical back: Normal range of motion and neck supple.     Right lower leg: Edema present.     Left lower leg: Edema present.     Comments: Trace edema in ankles.   Skin:    General: Skin is warm and dry.  Neurological:     General: No focal deficit present.     Mental Status: He is alert and oriented to person, place, and time. Mental status is at baseline.     Motor: No weakness.     Gait: Gait abnormal.  Psychiatric:        Mood and Affect: Mood normal.        Behavior: Behavior normal.        Thought Content: Thought content normal.        Judgment: Judgment normal.     Labs reviewed: Recent Labs    06/05/22 1122 08/06/22 1933 08/06/22 1947 08/25/22 1848  NA 140 135 137 137  K 4.0 4.4 4.4 4.3  CL 110 102 101 104  CO2 25 23  --  25  GLUCOSE 137* 206* 205* 171*  BUN 30* 26* 27* 25*  CREATININE 0.96 0.99 0.90 0.86  CALCIUM 9.7 9.5  --  9.7   Recent Labs    08/06/22 1933  AST 69*  ALT 66*  ALKPHOS 59  BILITOT 1.1  PROT 6.5  ALBUMIN 3.6   Recent Labs    06/05/22 1122 08/06/22 1933 08/06/22 1947 08/25/22 1848   WBC 5.2 4.0  --  5.2  HGB 13.6 13.1 12.9* 13.0  HCT 41.8 39.5 38.0* 40.8  MCV 99.1 98.8  --  100.2*  PLT 144* 125*  --  161   Lab Results  Component Value Date   TSH 0.885 03/23/2019   Lab Results  Component Value Date   HGBA1C 6.3 (H) 06/05/2022   Lab Results  Component Value Date   CHOL 131 12/10/2013   HDL 51 12/10/2013   LDLCALC 60 12/10/2013   TRIG 53 03/23/2019   CHOLHDL 2.6 12/10/2013    Significant Diagnostic Results in last 30 days:  No results found.  Assessment/Plan Sore gums  right lower gum pain/redness following refitted denture, c/o it hurts to wear dentures, pending dental f/u. No abscess or ulcer noted. Avoid using denture until seen by dentist, soft food for now.   Type 2 diabetes mellitus (HCC)  Hgb A1c 8.2 01/02/23, started Meformin, am fasting CBG 165-279  Urinary frequency Urinary frequency, Hx of bladder neoplasm,  2-3x nocturnal urination, no difficulty returning  asleep, on Tamsulosin.   Two-vessel CAD status post CABG x2;  GABG x2, post Op afib, takes Plavix, Metoprolol, Jardiance, and statin  Vitamin D deficiency on Vit D3, Vit D 51 01/02/23  Vitamin B12 deficiency  on Vit B12, Vit B12 877, Hgb 14.1 01/02/23  Hereditary and idiopathic peripheral neuropathy on Gabapentin, ambulates with walker.   Glaucoma wear glasses, on Timolol, Latanoprost  Dyslipidemia, goal LDL below 70 on Zetia, Fish oil, Rosuvastatin, LDL 57 01/02/23  Paroxysmal tachycardia (HCC) Paroxysmal tachycardia, on Metoprolol, TSH 2.91      Family/ staff Communication: plan of care reviewed with the patient and charge nurse.   Labs/tests ordered:  none  Time spend 35 minutes.

## 2023-01-10 NOTE — Assessment & Plan Note (Signed)
on Gabapentin, ambulates with walker.  

## 2023-01-10 NOTE — Assessment & Plan Note (Signed)
on Zetia, Fish oil, Rosuvastatin, LDL 57 01/02/23

## 2023-01-10 NOTE — Assessment & Plan Note (Signed)
GABG x2, post Op afib, takes Plavix, Metoprolol, Jardiance, and statin

## 2023-01-10 NOTE — Assessment & Plan Note (Signed)
on Vit D3, Vit D 51 01/02/23

## 2023-01-10 NOTE — Assessment & Plan Note (Signed)
on Vit B12, Vit B12 877, Hgb 14.1 01/02/23

## 2023-01-10 NOTE — Assessment & Plan Note (Signed)
wear glasses, on Timolol, Latanoprost 

## 2023-01-10 NOTE — Assessment & Plan Note (Signed)
right lower gum pain/redness following refitted denture, c/o it hurts to wear dentures, pending dental f/u. No abscess or ulcer noted. Avoid using denture until seen by dentist, soft food for now.

## 2023-01-29 ENCOUNTER — Inpatient Hospital Stay (HOSPITAL_COMMUNITY)
Admission: EM | Admit: 2023-01-29 | Discharge: 2023-02-01 | DRG: 696 | Disposition: A | Discharge status: Nursing Home (Includes ICF) | Payer: Medicare Other | Source: Skilled Nursing Facility | Attending: Internal Medicine | Admitting: Internal Medicine

## 2023-01-29 ENCOUNTER — Encounter (HOSPITAL_COMMUNITY): Payer: Self-pay | Admitting: *Deleted

## 2023-01-29 ENCOUNTER — Encounter: Payer: Self-pay | Admitting: Nurse Practitioner

## 2023-01-29 ENCOUNTER — Other Ambulatory Visit: Payer: Self-pay

## 2023-01-29 ENCOUNTER — Non-Acute Institutional Stay: Payer: Medicare Other | Admitting: Nurse Practitioner

## 2023-01-29 DIAGNOSIS — Z8673 Personal history of transient ischemic attack (TIA), and cerebral infarction without residual deficits: Secondary | ICD-10-CM

## 2023-01-29 DIAGNOSIS — Z820 Family history of epilepsy and other diseases of the nervous system: Secondary | ICD-10-CM

## 2023-01-29 DIAGNOSIS — Z8249 Family history of ischemic heart disease and other diseases of the circulatory system: Secondary | ICD-10-CM

## 2023-01-29 DIAGNOSIS — E559 Vitamin D deficiency, unspecified: Secondary | ICD-10-CM | POA: Diagnosis present

## 2023-01-29 DIAGNOSIS — L89152 Pressure ulcer of sacral region, stage 2: Secondary | ICD-10-CM | POA: Diagnosis present

## 2023-01-29 DIAGNOSIS — H409 Unspecified glaucoma: Secondary | ICD-10-CM | POA: Diagnosis present

## 2023-01-29 DIAGNOSIS — L8992 Pressure ulcer of unspecified site, stage 2: Secondary | ICD-10-CM | POA: Insufficient documentation

## 2023-01-29 DIAGNOSIS — E538 Deficiency of other specified B group vitamins: Secondary | ICD-10-CM | POA: Diagnosis present

## 2023-01-29 DIAGNOSIS — I2510 Atherosclerotic heart disease of native coronary artery without angina pectoris: Secondary | ICD-10-CM | POA: Diagnosis present

## 2023-01-29 DIAGNOSIS — R310 Gross hematuria: Principal | ICD-10-CM | POA: Diagnosis present

## 2023-01-29 DIAGNOSIS — I480 Paroxysmal atrial fibrillation: Secondary | ICD-10-CM | POA: Diagnosis present

## 2023-01-29 DIAGNOSIS — E785 Hyperlipidemia, unspecified: Secondary | ICD-10-CM | POA: Diagnosis present

## 2023-01-29 DIAGNOSIS — Z7902 Long term (current) use of antithrombotics/antiplatelets: Secondary | ICD-10-CM

## 2023-01-29 DIAGNOSIS — Z955 Presence of coronary angioplasty implant and graft: Secondary | ICD-10-CM

## 2023-01-29 DIAGNOSIS — M810 Age-related osteoporosis without current pathological fracture: Secondary | ICD-10-CM | POA: Diagnosis present

## 2023-01-29 DIAGNOSIS — Z8551 Personal history of malignant neoplasm of bladder: Secondary | ICD-10-CM

## 2023-01-29 DIAGNOSIS — Z85828 Personal history of other malignant neoplasm of skin: Secondary | ICD-10-CM

## 2023-01-29 DIAGNOSIS — Z7901 Long term (current) use of anticoagulants: Secondary | ICD-10-CM

## 2023-01-29 DIAGNOSIS — Z79899 Other long term (current) drug therapy: Secondary | ICD-10-CM

## 2023-01-29 DIAGNOSIS — Z87891 Personal history of nicotine dependence: Secondary | ICD-10-CM | POA: Diagnosis not present

## 2023-01-29 DIAGNOSIS — Z951 Presence of aortocoronary bypass graft: Secondary | ICD-10-CM | POA: Diagnosis not present

## 2023-01-29 DIAGNOSIS — L89302 Pressure ulcer of unspecified buttock, stage 2: Secondary | ICD-10-CM

## 2023-01-29 DIAGNOSIS — E119 Type 2 diabetes mellitus without complications: Secondary | ICD-10-CM

## 2023-01-29 DIAGNOSIS — R319 Hematuria, unspecified: Secondary | ICD-10-CM | POA: Insufficient documentation

## 2023-01-29 DIAGNOSIS — G609 Hereditary and idiopathic neuropathy, unspecified: Secondary | ICD-10-CM

## 2023-01-29 DIAGNOSIS — Z7984 Long term (current) use of oral hypoglycemic drugs: Secondary | ICD-10-CM

## 2023-01-29 DIAGNOSIS — E1141 Type 2 diabetes mellitus with diabetic mononeuropathy: Secondary | ICD-10-CM | POA: Diagnosis present

## 2023-01-29 DIAGNOSIS — I479 Paroxysmal tachycardia, unspecified: Secondary | ICD-10-CM

## 2023-01-29 DIAGNOSIS — E1165 Type 2 diabetes mellitus with hyperglycemia: Secondary | ICD-10-CM

## 2023-01-29 LAB — CBC WITH DIFFERENTIAL/PLATELET
Abs Immature Granulocytes: 0.04 10*3/uL (ref 0.00–0.07)
Basophils Absolute: 0 10*3/uL (ref 0.0–0.1)
Basophils Relative: 0 %
Eosinophils Absolute: 0.1 10*3/uL (ref 0.0–0.5)
Eosinophils Relative: 1 %
HCT: 41.1 % (ref 39.0–52.0)
Hemoglobin: 13.3 g/dL (ref 13.0–17.0)
Immature Granulocytes: 1 %
Lymphocytes Relative: 12 %
Lymphs Abs: 1 10*3/uL (ref 0.7–4.0)
MCH: 31.5 pg (ref 26.0–34.0)
MCHC: 32.4 g/dL (ref 30.0–36.0)
MCV: 97.4 fL (ref 80.0–100.0)
Monocytes Absolute: 0.5 10*3/uL (ref 0.1–1.0)
Monocytes Relative: 5 %
Neutro Abs: 7.2 10*3/uL (ref 1.7–7.7)
Neutrophils Relative %: 81 %
Platelets: 161 10*3/uL (ref 150–400)
RBC: 4.22 MIL/uL (ref 4.22–5.81)
RDW: 12.6 % (ref 11.5–15.5)
WBC: 8.7 10*3/uL (ref 4.0–10.5)
nRBC: 0 % (ref 0.0–0.2)

## 2023-01-29 LAB — TYPE AND SCREEN
ABO/RH(D): A POS
Antibody Screen: NEGATIVE

## 2023-01-29 LAB — PROTIME-INR
INR: 1.1 (ref 0.8–1.2)
Prothrombin Time: 14.1 seconds (ref 11.4–15.2)

## 2023-01-29 LAB — COMPREHENSIVE METABOLIC PANEL
ALT: 33 U/L (ref 0–44)
AST: 30 U/L (ref 15–41)
Albumin: 3.9 g/dL (ref 3.5–5.0)
Alkaline Phosphatase: 42 U/L (ref 38–126)
Anion gap: 10 (ref 5–15)
BUN: 33 mg/dL — ABNORMAL HIGH (ref 8–23)
CO2: 22 mmol/L (ref 22–32)
Calcium: 9.5 mg/dL (ref 8.9–10.3)
Chloride: 102 mmol/L (ref 98–111)
Creatinine, Ser: 0.98 mg/dL (ref 0.61–1.24)
GFR, Estimated: >60 mL/min (ref >60)
Glucose, Bld: 211 mg/dL — ABNORMAL HIGH (ref 70–99)
Potassium: 4.7 mmol/L (ref 3.5–5.1)
Sodium: 134 mmol/L — ABNORMAL LOW (ref 135–145)
Total Bilirubin: 1.3 mg/dL — ABNORMAL HIGH (ref 0.3–1.2)
Total Protein: 7.4 g/dL (ref 6.5–8.1)

## 2023-01-29 LAB — GLUCOSE, CAPILLARY: Glucose-Capillary: 141 mg/dL — ABNORMAL HIGH (ref 70–99)

## 2023-01-29 MED ORDER — METOPROLOL SUCCINATE ER 25 MG PO TB24
12.5000 mg | ORAL_TABLET | Freq: Every day | ORAL | Status: DC
  Administered 2023-01-30 – 2023-02-01 (×3): 12.5 mg via ORAL
  Filled 2023-01-29 (×3): qty 1

## 2023-01-29 MED ORDER — ACETAMINOPHEN 650 MG RE SUPP: 650.0000 mg | Freq: Four times a day (QID) | RECTAL | Status: DC | PRN

## 2023-01-29 MED ORDER — DOCUSATE SODIUM 100 MG PO CAPS
100.0000 mg | ORAL_CAPSULE | Freq: Two times a day (BID) | ORAL | Status: DC
  Administered 2023-01-29 – 2023-02-01 (×6): 100 mg via ORAL
  Filled 2023-01-29 (×6): qty 1

## 2023-01-29 MED ORDER — ROSUVASTATIN CALCIUM 20 MG PO TABS
40.0000 mg | ORAL_TABLET | Freq: Every day | ORAL | Status: DC
  Administered 2023-01-30 – 2023-02-01 (×3): 40 mg via ORAL
  Filled 2023-01-29 (×3): qty 2

## 2023-01-29 MED ORDER — ONDANSETRON HCL 4 MG/2ML IJ SOLN: 4.0000 mg | Freq: Four times a day (QID) | INTRAMUSCULAR | Status: DC | PRN

## 2023-01-29 MED ORDER — ACETAMINOPHEN 325 MG PO TABS
650.0000 mg | ORAL_TABLET | Freq: Four times a day (QID) | ORAL | Status: DC | PRN
  Administered 2023-01-30 (×2): 650 mg via ORAL
  Filled 2023-01-29 (×2): qty 2

## 2023-01-29 MED ORDER — ONDANSETRON HCL 4 MG PO TABS: 4.0000 mg | ORAL_TABLET | Freq: Four times a day (QID) | ORAL | Status: DC | PRN

## 2023-01-29 MED ORDER — VITAMIN D 25 MCG (1000 UNIT) PO TABS
2000.0000 [IU] | ORAL_TABLET | Freq: Every day | ORAL | Status: DC
  Administered 2023-01-30 – 2023-02-01 (×3): 2000 [IU] via ORAL
  Filled 2023-01-29 (×3): qty 2

## 2023-01-29 MED ORDER — SODIUM CHLORIDE 0.9 % IV SOLN: INTRAVENOUS | Status: DC

## 2023-01-29 MED ORDER — SODIUM CHLORIDE 0.9 % IR SOLN
3000.0000 mL | Status: DC
  Administered 2023-01-29 – 2023-01-30 (×8): 3000 mL

## 2023-01-29 MED ORDER — EZETIMIBE 10 MG PO TABS
10.0000 mg | ORAL_TABLET | Freq: Every day | ORAL | Status: DC
  Administered 2023-01-30 – 2023-02-01 (×3): 10 mg via ORAL
  Filled 2023-01-29 (×3): qty 1

## 2023-01-29 MED ORDER — INSULIN ASPART 100 UNIT/ML IJ SOLN
0.0000 [IU] | Freq: Three times a day (TID) | INTRAMUSCULAR | Status: DC
  Filled 2023-01-29: qty 0.06

## 2023-01-29 MED ORDER — TAMSULOSIN HCL 0.4 MG PO CAPS
0.4000 mg | ORAL_CAPSULE | Freq: Every day | ORAL | Status: DC
  Administered 2023-01-29 – 2023-02-01 (×4): 0.4 mg via ORAL
  Filled 2023-01-29 (×4): qty 1

## 2023-01-29 MED ORDER — VITAMIN B-12 100 MCG PO TABS
100.0000 ug | ORAL_TABLET | Freq: Every day | ORAL | Status: DC
  Administered 2023-01-30 – 2023-02-01 (×3): 100 ug via ORAL
  Filled 2023-01-29 (×3): qty 1

## 2023-01-29 MED ORDER — SODIUM CHLORIDE 0.9 % IV SOLN
1.0000 g | INTRAVENOUS | Status: DC
  Administered 2023-01-29 – 2023-01-31 (×3): 1 g via INTRAVENOUS
  Filled 2023-01-29 (×4): qty 10

## 2023-01-29 NOTE — Assessment & Plan Note (Signed)
on Vit B12, Vit B12 877, Hgb 14.1 01/02/23 

## 2023-01-29 NOTE — Progress Notes (Addendum)
Location:   [facility] Room Number: 926 Place of Service:  ALF (13) Provider: Arna Snipe Braylie Badami NP  [name], MD  Patient Care Team: [name], MD as PCP - General (Internal Medicine) [name], MD as PCP - Cardiology (Cardiology)  Extended Emergency Contact Information Primary Emergency Contact: [name],[name] Mobile Phone: [phone] Relation: Nephew Preferred language: English Interpreter needed? No  Code Status: DNR Goals of care: Advanced Directive information    12/30/2022   10:13 AM  Advanced Directives  Does Patient Have a Medical Advance Directive? Yes  Type of Advance Directive Out of facility DNR (pink MOST or yellow form)  Would patient like information on creating a medical advance directive? No - Patient declined     Chief Complaint  Patient presents with   Acute Visit    Bright red blood in urine x4    HPI:  Pt is a 89 y.o. male seen today for an acute visit for bright blood in urine x4, may a day or two, slightly suprapubic discomfort, but denied dysuria, increased urinary frequency, urgency, or incontinence. He is afebrile, in his usual state of health.  T2DM, Hgb A1c 8.2 01/02/23, started Meformin              Urinary frequency, Hx of bladder neoplasm,  2-3x nocturnal urination, no difficulty returning asleep, on Tamsulosin.              CAD, GABG x2, post Op afib, takes Plavix, Metoprolol, Jardiance, and statin             Vitamin D deficiency, on Vit D3, Vit D 51 01/02/23             Vit B12 deficiency, on Vit B12, Vit B12 877, Hgb 14.1 01/02/23             Peripheral neuropathy, on Gabapentin, ambulates with walker.  Glaucoma, wear glasses, on Timolol, Latanoprost HLD on Zetia, Fish oil, Rosuvastatin, LDL 57 01/02/23 Paroxysmal tachycardia, on Metoprolol, TSH 2.91 01/02/23                           Past Medical History:  Diagnosis Date   Anticoagulant long-term use    plavix  (for hx TIA)   Arthritis    right shoulder    Benign  essential tremor    hands -- occasional   CAD S/P percutaneous coronary angioplasty cardiologist-- dr harding   a) PCI and BMS to LAD in 1991; b) CATH 10/2010: Severe 2 Vessel disease = diffuse RCA ( 75% prox, 90% mid & 70 + 75% distal) & LAD (80-90% pre-stent, 75% post-stent) with occluded D1 (filled via OM-D1 collaterals)  -- CABG x 2; c) Myoview 4/'15: INTERMEDIATE RISK - small sized reversible apical/anterolateral defect -- most likely c/w known occlusion of D1 with collaterals.(med Rx)   History of basal cell carcinoma (BCC) excision    History of bladder cancer urologist-- dr eskridge   dx 11/ 2015-- s/p TURBT   History of kidney stones    History of TIA (transient ischemic attack) 12/09/2013   History of urethral stricture    Hyperlipidemia with target LDL less than 70    controlled.   Neuropathy of lower extremity    Nocturia    Osteoporosis    Peripheral neuropathy    lower extremity   Postoperative atrial fibrillation (HCC) 10/2010   after bypass-"shocked back in and no problems since":   per cardiologist  note, [name], no recurrence   Pre-diabetes    S/P CABG x 2 10/18/2010   LIMA-LAD, SVG to RPDA (Dr. Cornelius Moras)   TGA (transient global amnesia)    "x3 for hours then goes away"   Transient Dilated idiopathic cardiomyopathy April 2015; 04/2014   a) in setting of TIA: EF 35-40% (reduced from 55-65 percent) mild AI, moderate and dilated aorta.;; b) Recheck Echo 04/2014: EF 55-60%, Gr 1 DD, mild-mod AI.     Wears glasses    Wears partial dentures    upper   Past Surgical History:  Procedure Laterality Date   APPENDECTOMY  as teen   BASAL CELL CARCINOMA EXCISION  07/2014   CARDIAC CATHETERIZATION  10/17/2010   80%prox w/mild in-stent restenosis w/75% mid-LADstenosis aft the stent,multi severe stenoses RCAw/heavily calcified vessel,norm LV   CARDIOVERSION  2012   after bypass surgery   CATARACT EXTRACTION W/ INTRAOCULAR LENS  IMPLANT, BILATERAL  2009  approx.   COCCYX REMOVAL      CORONARY ANGIOPLASTY WITH STENT PLACEMENT  1991   PCI and BMS to LAD   CORONARY ARTERY BYPASS GRAFT  10-18-2010  [name] @[facility]    LIMA-LAD, SVG-rPDA   CYSTOSCOPY W/ RETROGRADES N/A 07/12/2014   Procedure: CYSTOSCOPY WITH RETROGRADE PYELOGRAM;  Surgeon: [name], MD;  Location: [facility];  Service: Urology;  Laterality: N/A;   CYSTOSCOPY WITH BIOPSY N/A 09/09/2014   Procedure: CYSTO WITH BIOPSY AND FULGERATION, DILATION OF URETHRAL STRICTURE;  Surgeon: [name], MD;  Location: [facility];  Service: Urology;  Laterality: N/A;  BLADDER BIOPSY   CYSTOSCOPY WITH BIOPSY N/A 01/03/2015   Procedure: CYSTOSCOPY WITH BIOPSY;  Surgeon: [name], MD;  Location: [facility];  Service: Urology;  Laterality: N/A;   CYSTOSCOPY WITH BIOPSY N/A 01/26/2019   Procedure: CYSTOSCOPY WITH BIOPSY, BILATERAL RETROGRADE PYELOGRAM;  Surgeon: [name], MD;  Location: [facility];  Service: Urology;  Laterality: N/A;   CYSTOSCOPY WITH FULGERATION N/A 01/26/2019   Procedure: CYSTOSCOPY WITH FULGERATION;  Surgeon: [name], MD;  Location: [facility];  Service: Urology;  Laterality: N/A;   CYSTOSCOPY WITH URETHRAL DILATATION N/A 01/03/2015   Procedure: CYSTOSCOPY WITH URETHRAL DILATATION;  Surgeon: [name], MD;  Location: [facility];  Service: Urology;  Laterality: N/A;   DOPPLER ECHOCARDIOGRAPHY  10/18/2004   EF 55-65%,BORDERLINE -enlarged aortic root,mild aortic insuff.,trace tricus.insuff.   FINGER SURGERY Left 03-17-2001   dr [name]  @[facility]    repair complex laceration injury 5th digit   KIDNEY STONE SURGERY  1989   NM MYOVIEW LTD  12/16/2013   INTERMEDIATE RISK test with small sized reversible perfusion defect in the apical anterolateral wall -- most likely consistent with known occlusion of D1 with collaterals.   shoulder surgery Right 1988   TONSILLECTOMY  as teen   TRANSTHORACIC ECHOCARDIOGRAM  April 2015   EF 35-40%, mild AI, moderately dilated ascending  aorta   TRANSURETHRAL RESECTION OF BLADDER TUMOR Right 06/11/2022   Procedure: TRANSURETHRAL RESECTION OF BLADDER TUMOR (TURBT)  >5cm;  Surgeon: [name], MD;  Location: [facility];  Service: Urology;  Laterality: Right;   TRANSURETHRAL RESECTION OF PROSTATE N/A 07/12/2014   Procedure: TRANSURETHRAL RESECTION OF THE PROSTATE (TURP) WITH MITOMYCIN -C;  Surgeon: [name], MD;  Location: [facility];  Service: Urology;  Laterality: N/A;   TRANSURETHRAL RESECTION OF PROSTATE N/A 06/11/2022   Procedure: TRANSURETHRAL RESECTION OF THE PROSTATE (TURP);  Surgeon: [name], MD;  Location: [facility];  Service: Urology;  Laterality: N/A;  REQUESTING 2 HRS    No Known Allergies  Allergies as of 01/29/2023   No Known Allergies      Medication List        Accurate as of Jan 29, 2023  3:26 PM. If you have any questions, ask your nurse or doctor.          acetaminophen 500 MG tablet Commonly known as: TYLENOL Take 1,000 mg by mouth every 6 (six) hours as needed for mild pain.   clopidogrel 75 MG tablet Commonly known as: PLAVIX Take 1 tablet (75 mg total) by mouth daily. Schedule an appointment for further refills   ezetimibe 10 MG tablet Commonly known as: ZETIA Take 10 mg by mouth daily.   fish oil-omega-3 fatty acids 1000 MG capsule Take 1 g by mouth daily.   gabapentin 100 MG capsule Commonly known as: NEURONTIN Take 100 mg by mouth 2 (two) times daily.   Jardiance 10 MG Tabs tablet Generic drug: empagliflozin Take 10 mg by mouth daily.   latanoprost 0.005 % ophthalmic solution Commonly known as: XALATAN Place 1 drop into both eyes at bedtime.   metoprolol succinate 25 MG 24 hr tablet Commonly known as: TOPROL-XL Take 12.5 mg by mouth daily.   multivitamin capsule Take 1 capsule by mouth at bedtime.   Prolia 60 MG/ML Sosy injection Generic drug: denosumab Inject 60 mg into the skin every 6 (six) months.   rosuvastatin 40 MG tablet Commonly known as:  CRESTOR TAKE 1 TABLET BY MOUTH DAILY   tamsulosin 0.4 MG Caps capsule Commonly known as: FLOMAX Take 0.4 mg by mouth at bedtime.   timolol 0.5 % ophthalmic solution Commonly known as: TIMOPTIC Place 1 drop into both eyes every morning.   VITAMIN B12 PO Place 5,000 mcg under the tongue daily.   vitamin D3 50 MCG (2000 UT) Caps Take 2,000 Units by mouth daily.        Review of Systems  Constitutional:  Negative for appetite change, fatigue and fever.  HENT:  Positive for hearing loss. Negative for congestion and trouble swallowing.   Eyes:  Positive for visual disturbance.       Low vision  Respiratory:  Negative for cough and wheezing.   Cardiovascular:  Positive for leg swelling. Negative for chest pain and palpitations.  Gastrointestinal:  Positive for abdominal pain. Negative for constipation, nausea and vomiting.       Suprapubic region discomfort.   Genitourinary:  Positive for frequency and scrotal swelling. Negative for difficulty urinating, dysuria, flank pain, genital sores, hematuria, penile discharge, penile pain, penile swelling, testicular pain and urgency.  Musculoskeletal:  Positive for arthralgias and gait problem.  Skin:  Negative for color change.  Neurological:  Positive for numbness. Negative for weakness and headaches.       Hx of numbness in legs.   Psychiatric/Behavioral:  Negative for behavioral problems and sleep disturbance. The patient is not nervous/anxious.     Immunization History  Administered Date(s) Administered   Influenza-Unspecified 06/02/2014   Tdap 08/25/2022   Zoster Recombinat (Shingrix) 06/18/2018, 08/28/2018   Pertinent  Health Maintenance Due  Topic Date Due   FOOT EXAM  Never done   OPHTHALMOLOGY EXAM  Never done   HEMOGLOBIN A1C  12/05/2022   INFLUENZA VACCINE  04/03/2023      03/27/2019    9:21 PM 03/28/2019    8:25 AM 03/22/2022   11:09 AM 08/06/2022    9:18 PM 08/25/2022    6:51 PM  Fall Risk  (  RETIRED) Patient Fall  Risk Level High fall risk High fall risk Moderate fall risk Low fall risk Low fall risk   Functional Status Survey:    Vitals:   01/29/23 1009  BP: 112/70  Pulse: 70  Resp: 16  Temp: (!) 97.2 F (36.2 C)  SpO2: 95%   There is no height or weight on file to calculate BMI. Physical Exam Vitals and nursing note reviewed.  Constitutional:      Appearance: Normal appearance.  HENT:     Head: Normocephalic and atraumatic.     Nose: Nose normal.     Mouth/Throat:     Mouth: Mucous membranes are moist.     Comments:   Eyes:     Extraocular Movements: Extraocular movements intact.     Conjunctiva/sclera: Conjunctivae normal.     Pupils: Pupils are equal, round, and reactive to light.  Cardiovascular:     Rate and Rhythm: Normal rate and regular rhythm.     Heart sounds: No murmur heard. Pulmonary:     Effort: Pulmonary effort is normal.     Breath sounds: No rales.  Abdominal:     General: Bowel sounds are normal. There is no distension.     Palpations: Abdomen is soft.     Tenderness: There is no abdominal tenderness. There is no right CVA tenderness, left CVA tenderness, guarding or rebound.  Genitourinary:    Penis: Normal.      Testes: Normal.     Comments: Bright blood on adult depends and outside of penis.  Musculoskeletal:     Cervical back: Normal range of motion and neck supple.     Right lower leg: Edema present.     Left lower leg: Edema present.     Comments: Trace edema in ankles.   Skin:    General: Skin is warm and dry.  Neurological:     General: No focal deficit present.     Mental Status: He is alert and oriented to person, place, and time. Mental status is at baseline.     Motor: No weakness.     Gait: Gait abnormal.  Psychiatric:        Mood and Affect: Mood normal.        Behavior: Behavior normal.        Thought Content: Thought content normal.        Judgment: Judgment normal.     Labs reviewed: Recent Labs    06/05/22 1122  08/06/22 1933 08/06/22 1947 08/25/22 1848  NA 140 135 137 137  K 4.0 4.4 4.4 4.3  CL 110 102 101 104  CO2 25 23  --  25  GLUCOSE 137* 206* 205* 171*  BUN 30* 26* 27* 25*  CREATININE 0.96 0.99 0.90 0.86  CALCIUM 9.7 9.5  --  9.7   Recent Labs    08/06/22 1933  AST 69*  ALT 66*  ALKPHOS 59  BILITOT 1.1  PROT 6.5  ALBUMIN 3.6   Recent Labs    06/05/22 1122 08/06/22 1933 08/06/22 1947 08/25/22 1848  WBC 5.2 4.0  --  5.2  HGB 13.6 13.1 12.9* 13.0  HCT 41.8 39.5 38.0* 40.8  MCV 99.1 98.8  --  100.2*  PLT 144* 125*  --  161   Lab Results  Component Value Date   TSH 0.885 03/23/2019   Lab Results  Component Value Date   HGBA1C 6.3 (H) 06/05/2022   Lab Results  Component Value Date   CHOL 131  12/10/2013   HDL 51 12/10/2013   LDLCALC 60 12/10/2013   TRIG 53 03/23/2019   CHOLHDL 2.6 12/10/2013    Significant Diagnostic Results in last 30 days:  No results found.  Assessment/Plan: Hematuria bright blood in urine x4, may a day or two, slightly suprapubic discomfort, but denied dysuria, increased urinary frequency, urgency, or incontinence. He is afebrile, in his usual state of health. The patient has history of bladder cancer, will see urology later today. The patient declined ED eval, desires staying [facility], will obtain UA C/S, CBC/diff, CMP/eGFR, hold Plavix for now.  01/29/23 wbc 6.6, Hgb 13.0, plt 151, neutrophils 76.8%, Na 137, K 4.5, Bun 28, creat 1.15  Type 2 diabetes mellitus (HCC) Hgb A1c 8.2 01/02/23, started Meformin  Two-vessel CAD status post CABG x2;   GABG x2, post Op afib, hold Plavix 2/2 to hematuria, continue Metoprolol, Jardiance, and statin  Vitamin D deficiency  on Vit D3, Vit D 51 01/02/23  Vitamin B12 deficiency on Vit B12, Vit B12 877, Hgb 14.1 01/02/23  Hereditary and idiopathic peripheral neuropathy on Gabapentin, ambulates with walker.   Paroxysmal tachycardia (HCC) Heart rate is in control, on Metoprolol, TSH 2.91 01/02/23  Pressure  ulcer, stage 2 (HCC) R+L buttocks, pressure reduction, barrier ointment for now    Family/ staff Communication: plan of care reviewed with the patient, the patient's HPOA, and charge nurse.   Labs/tests ordered: CBC/diff, CMP/eGFR, UA C/S  Time spend 40 minutes.

## 2023-01-29 NOTE — Assessment & Plan Note (Signed)
on Vit D3, Vit D 51 01/02/23 

## 2023-01-29 NOTE — H&P (Addendum)
History and Physical    Tanner [name] DGL:[id number] DOB: February 03, 1924 DOA: 01/29/2023  PCP: [name], MD   Patient coming from: Home  I have personally briefly reviewed patient's old medical records in [facility]  Chief Complaint: [name]  HPI: Tanner [name] is a 89 y.o. male with PMH significant for history of bladder cancer requiring multiple TURBTs, history of CAD, hyperlipidemia, type 2 diabetes, presented in the urology office with gross hematuria.  Patient has developed gross hematuria for last 1 week,  denies any dysuria or flank pain.  He was seen with [facility] for continued gross hematuria with lower abdominal pain.  Patient continues to have bleeding.  A three-way Foley catheter was unable to be placed in the office.  Patient was sent to the ED for admission for gross hematuria, CBI and empiric antibiotics.  Patient denies any fever, nausea, vomiting, diarrhea, sick contacts or recent travel.  ED Course: He was hemodynamically stable.   Blood pressure 133/89, RR 18, HR 95, temp 97.4, SpO2 100% on room air. Labs include sodium 134, potassium 4.7, chloride 102, bicarb 22, glucose 211, BUN 33, creatinine 0.98, calcium 9.5, anion gap 10, alkaline phosphatase 42, albumin 3.9, AST 30, ALT 33, total protein 7.4, total bilirubin 1.3, WBC 8.7, hemoglobin 13.3, hematocrit 41.1, MCV 97.4, platelet 161.    Review of Systems:  Review of Systems  Constitutional: Negative.   HENT:  Positive for hearing loss.   Eyes: Negative.   Respiratory: Negative.    Cardiovascular: Negative.   Gastrointestinal: Negative.   Genitourinary:  Positive for hematuria. Negative for dysuria, flank pain, frequency and urgency.  Musculoskeletal: Negative.   Skin: Negative.   Neurological: Negative.   Endo/Heme/Allergies: Negative.   Psychiatric/Behavioral: Negative.      Past Medical History:  Diagnosis Date   Anticoagulant long-term use    plavix  (for hx TIA)   Arthritis    right  shoulder    Benign essential tremor    hands -- occasional   CAD S/P percutaneous coronary angioplasty cardiologist-- dr harding   a) PCI and BMS to LAD in 1991; b) CATH 10/2010: Severe 2 Vessel disease = diffuse RCA ( 75% prox, 90% mid & 70 + 75% distal) & LAD (80-90% pre-stent, 75% post-stent) with occluded D1 (filled via OM-D1 collaterals)  -- CABG x 2; c) Myoview 4/'15: INTERMEDIATE RISK - small sized reversible apical/anterolateral defect -- most likely c/w known occlusion of D1 with collaterals.(med Rx)   History of basal cell carcinoma (BCC) excision    History of bladder cancer urologist-- dr eskridge   dx 11/ 2015-- s/p TURBT   History of kidney stones    History of TIA (transient ischemic attack) 12/09/2013   History of urethral stricture    Hyperlipidemia with target LDL less than 70    controlled.   Neuropathy of lower extremity    Nocturia    Osteoporosis    Peripheral neuropathy    lower extremity   Postoperative atrial fibrillation (HCC) 10/2010   after bypass-"shocked back in and no problems since":   per cardiologist note, [name], no recurrence   Pre-diabetes    S/P CABG x 2 10/18/2010   LIMA-LAD, SVG to RPDA (Dr. Cornelius Moras)   TGA (transient global amnesia)    "x3 for hours then goes away"   Transient Dilated idiopathic cardiomyopathy April 2015; 04/2014   a) in setting of TIA: EF 35-40% (reduced from 55-65 percent) mild AI, moderate and dilated aorta.;; b)  Recheck Echo 04/2014: EF 55-60%, Gr 1 DD, mild-mod AI.     Wears glasses    Wears partial dentures    upper    Past Surgical History:  Procedure Laterality Date   APPENDECTOMY  as teen   BASAL CELL CARCINOMA EXCISION  07/2014   CARDIAC CATHETERIZATION  10/17/2010   80%prox w/mild in-stent restenosis w/75% mid-LADstenosis aft the stent,multi severe stenoses RCAw/heavily calcified vessel,norm LV   CARDIOVERSION  2012   after bypass surgery   CATARACT EXTRACTION W/ INTRAOCULAR LENS  IMPLANT, BILATERAL  2009   approx.   COCCYX REMOVAL     CORONARY ANGIOPLASTY WITH STENT PLACEMENT  1991   PCI and BMS to LAD   CORONARY ARTERY BYPASS GRAFT  10-18-2010  [name] @[facility]    LIMA-LAD, SVG-rPDA   CYSTOSCOPY W/ RETROGRADES N/A 07/12/2014   Procedure: CYSTOSCOPY WITH RETROGRADE PYELOGRAM;  Surgeon: [name], MD;  Location: [facility];  Service: Urology;  Laterality: N/A;   CYSTOSCOPY WITH BIOPSY N/A 09/09/2014   Procedure: CYSTO WITH BIOPSY AND FULGERATION, DILATION OF URETHRAL STRICTURE;  Surgeon: [name], MD;  Location: [facility];  Service: Urology;  Laterality: N/A;  BLADDER BIOPSY   CYSTOSCOPY WITH BIOPSY N/A 01/03/2015   Procedure: CYSTOSCOPY WITH BIOPSY;  Surgeon: [name], MD;  Location: [facility];  Service: Urology;  Laterality: N/A;   CYSTOSCOPY WITH BIOPSY N/A 01/26/2019   Procedure: CYSTOSCOPY WITH BIOPSY, BILATERAL RETROGRADE PYELOGRAM;  Surgeon: [name], MD;  Location: [facility];  Service: Urology;  Laterality: N/A;   CYSTOSCOPY WITH FULGERATION N/A 01/26/2019   Procedure: CYSTOSCOPY WITH FULGERATION;  Surgeon: [name], MD;  Location: [facility];  Service: Urology;  Laterality: N/A;   CYSTOSCOPY WITH URETHRAL DILATATION N/A 01/03/2015   Procedure: CYSTOSCOPY WITH URETHRAL DILATATION;  Surgeon: [name], MD;  Location: [facility];  Service: Urology;  Laterality: N/A;   DOPPLER ECHOCARDIOGRAPHY  10/18/2004   EF 55-65%,BORDERLINE -enlarged aortic root,mild aortic insuff.,trace tricus.insuff.   FINGER SURGERY Left 03-17-2001   dr [name]  @[facility]    repair complex laceration injury 5th digit   KIDNEY STONE SURGERY  1989   NM MYOVIEW LTD  12/16/2013   INTERMEDIATE RISK test with small sized reversible perfusion defect in the apical anterolateral wall -- most likely consistent with known occlusion of D1 with collaterals.   shoulder surgery Right 1988   TONSILLECTOMY  as teen   TRANSTHORACIC ECHOCARDIOGRAM  April 2015   EF 35-40%, mild AI,  moderately dilated ascending aorta   TRANSURETHRAL RESECTION OF BLADDER TUMOR Right 06/11/2022   Procedure: TRANSURETHRAL RESECTION OF BLADDER TUMOR (TURBT)  >5cm;  Surgeon: [name], MD;  Location: [facility];  Service: Urology;  Laterality: Right;   TRANSURETHRAL RESECTION OF PROSTATE N/A 07/12/2014   Procedure: TRANSURETHRAL RESECTION OF THE PROSTATE (TURP) WITH MITOMYCIN -C;  Surgeon: [name], MD;  Location: [facility];  Service: Urology;  Laterality: N/A;   TRANSURETHRAL RESECTION OF PROSTATE N/A 06/11/2022   Procedure: TRANSURETHRAL RESECTION OF THE PROSTATE (TURP);  Surgeon: [name], MD;  Location: [facility];  Service: Urology;  Laterality: N/A;  REQUESTING 2 HRS     reports that he quit smoking about 65 years ago. His smoking use included cigarettes. He has a 5.00 pack-year smoking history. He has never used smokeless tobacco. He reports that he does not drink alcohol and does not use drugs.  No Known Allergies  Family History  Problem Relation Age of Onset   Heart Problems Mother  Parkinson's disease Father    Heart Problems Father    Family history reviewed and not pertinent .  Prior to Admission medications   Medication Sig Start Date End Date Taking? Authorizing Provider  acetaminophen (TYLENOL) 500 MG tablet Take 1,000 mg by mouth every 6 (six) hours as needed for mild pain.    [provider]  Cholecalciferol (VITAMIN D3) 50 MCG (2000 UT) CAPS Take 2,000 Units by mouth daily.    [provider]  clopidogrel (PLAVIX) 75 MG tablet Take 1 tablet (75 mg total) by mouth daily. Schedule an appointment for further refills 06/14/22   [name], MD  Cyanocobalamin (VITAMIN B12 PO) Place 5,000 mcg under the tongue daily.    [provider]  denosumab (PROLIA) 60 MG/ML SOSY injection Inject 60 mg into the skin every 6 (six) months. 11/10/19   [provider]  ezetimibe (ZETIA) 10 MG tablet Take 10 mg by mouth daily.     [provider]  fish oil-omega-3 fatty acids 1000 MG capsule Take 1 g by mouth daily.     [provider]  gabapentin (NEURONTIN) 100 MG capsule Take 100 mg by mouth 2 (two) times daily.    [provider]  JARDIANCE 10 MG TABS tablet Take 10 mg by mouth daily. 04/18/21   [provider]  latanoprost (XALATAN) 0.005 % ophthalmic solution Place 1 drop into both eyes at bedtime. 04/02/22   [provider]  metoprolol succinate (TOPROL-XL) 25 MG 24 hr tablet Take 12.5 mg by mouth daily.    [provider]  Multiple Vitamin (MULTIVITAMIN) capsule Take 1 capsule by mouth at bedtime.     [provider]  rosuvastatin (CRESTOR) 40 MG tablet TAKE 1 TABLET BY MOUTH DAILY 09/12/21   Rollene Rotunda, MD  tamsulosin (FLOMAX) 0.4 MG CAPS capsule Take 0.4 mg by mouth at bedtime.    [provider]  timolol (TIMOPTIC) 0.5 % ophthalmic solution Place 1 drop into both eyes every morning. 04/02/22   [provider]    Physical Exam: Vitals:   01/29/23 1535 01/29/23 1930  BP: 126/85 133/89  Pulse: 70 95  Resp: 19 18  Temp: (!) 97.4 F (36.3 C) 98.4 F (36.9 C)  TempSrc: Oral Oral  SpO2: 100% 99%    Constitutional: Appears comfortable, deconditioned, not in any acute distress. Vitals:   01/29/23 1535 01/29/23 1930  BP: 126/85 133/89  Pulse: 70 95  Resp: 19 18  Temp: (!) 97.4 F (36.3 C) 98.4 F (36.9 C)  TempSrc: Oral Oral  SpO2: 100% 99%   Eyes: PERRL, lids and conjunctivae normal ENMT: Mucous membranes are moist.  Posterior pharynx without exudate. Neck: normal, supple, no masses, no thyromegaly Respiratory: Clear bilaterally, respiratory effort normal, no accessory muscle use, no wheezing, no crackles. Cardiovascular: S1-S2 heard, regular rate and rhythm, murmur+ no edema. Abdomen: Abdomen is soft, nontender, nondistended, bowel sounds positive.  Musculoskeletal: No edema, no clubbing / cyanosis. No joint deformity  upper and lower extremities. Good ROM, no contractures. Normal muscle tone.  Skin: no rashes, lesions, ulcers. No induration Neurologic: CN 2-12 grossly intact. Sensation intact, DTR normal. Strength 5/5 in all 4.  Psychiatric: Normal judgment and insight. Alert and oriented x 3. Normal mood.   Labs on Admission: I have personally reviewed following labs and imaging studies  CBC: Recent Labs  Lab 01/29/23 1606  WBC 8.7  NEUTROABS 7.2  HGB 13.3  HCT 41.1  MCV 97.4  PLT 161  Basic Metabolic Panel: Recent Labs  Lab 01/29/23 1606  NA 134*  K 4.7  CL 102  CO2 22  GLUCOSE 211*  BUN 33*  CREATININE 0.98  CALCIUM 9.5   GFR: CrCl cannot be calculated (Unknown ideal weight.). Liver Function Tests: Recent Labs  Lab 01/29/23 1606  AST 30  ALT 33  ALKPHOS 42  BILITOT 1.3*  PROT 7.4  ALBUMIN 3.9   No results for input(s): "LIPASE", "AMYLASE" in the last 168 hours. No results for input(s): "AMMONIA" in the last 168 hours. Coagulation Profile: Recent Labs  Lab 01/29/23 1606  INR 1.1   Cardiac Enzymes: No results for input(s): "CKTOTAL", "CKMB", "CKMBINDEX", "TROPONINI" in the last 168 hours. BNP (last 3 results) No results for input(s): "PROBNP" in the last 8760 hours. HbA1C: No results for input(s): "HGBA1C" in the last 72 hours. CBG: No results for input(s): "GLUCAP" in the last 168 hours. Lipid Profile: No results for input(s): "CHOL", "HDL", "LDLCALC", "TRIG", "CHOLHDL", "LDLDIRECT" in the last 72 hours. Thyroid Function Tests: No results for input(s): "TSH", "T4TOTAL", "FREET4", "T3FREE", "THYROIDAB" in the last 72 hours. Anemia Panel: No results for input(s): "VITAMINB12", "FOLATE", "FERRITIN", "TIBC", "IRON", "RETICCTPCT" in the last 72 hours. Urine analysis:    Component Value Date/Time   COLORURINE YELLOW 03/23/2019 0421   APPEARANCEUR CLOUDY (A) 03/23/2019 0421   LABSPEC 1.015 03/23/2019 0421   PHURINE 5.0 03/23/2019 0421   GLUCOSEU NEGATIVE  03/23/2019 0421   HGBUR SMALL (A) 03/23/2019 0421   BILIRUBINUR NEGATIVE 03/23/2019 0421   KETONESUR NEGATIVE 03/23/2019 0421   PROTEINUR 30 (A) 03/23/2019 0421   UROBILINOGEN 0.2 04/22/2014 1515   NITRITE POSITIVE (A) 03/23/2019 0421   LEUKOCYTESUR LARGE (A) 03/23/2019 0421    Radiological Exams on Admission: No results found.  EKG: No EKG ordered.  Please review EKG  Assessment/Plan Principal Problem:   Gross hematuria Active Problems:   Dyslipidemia, goal LDL below 70   Type 2 diabetes mellitus (HCC)   Vitamin D deficiency   Glaucoma   History of bladder cancer   Chronic anticoagulation  Gross Hematuria: Patient with history of bladder cancer, developed gross hematuria for last 1 week. Patient underwent bladder irrigation with clot removal by urologist today. Continuous bladder irrigation. Hold Plavix or other anticoagulants. Continue IV fluid resuscitation. Continue empiric antibiotics ceftriaxone 1 g daily. Monitor H&H.  Urology  follow-up in the morning.  History of bladder cancer: Patient follows up with Dr. Pete Glatter.  Had multiple TURBTs Patient was also treated with intravesical BCG.  History of CAD, s/p CABG: Hold Plavix, other anticoagulants in the setting of hematuria. Continue rosuvastatin, metoprolol.  Hyperlipidemia Continue rosuvastatin 40 mg daily  Type 2 diabetes Hold p.o. diabetic medication Start regular insulin sliding scale,  History of paroxysmal tachycardia: History of postoperative A-fib. Patient has postoperative A-fib after his CABG but has not had any breakthrough episodes of A-fib. Continue Metoprolol.  Vitamin D deficiency: Continue vitamin D supplementation  Vitamin B12 deficiency: Continue B12 supplementation  DVT prophylaxis: SCDs Code Status: Full code Family Communication: Family at bed side. Disposition Plan:    Consults called: Urology Larina Bras king Admission status: Inpatient   Willeen Niece MD [facility]   If 7PM-7AM, please contact night-coverage   01/29/2023, 7:52 PM

## 2023-01-29 NOTE — ED Provider Notes (Signed)
[place] EMERGENCY DEPARTMENT AT [facility] Provider Note   CSN: [id number] Arrival date & time: 01/29/23  1514     History  Chief Complaint  Patient presents with   Hematuria    Tanner [name] is a 89 y.o. male with past medical history significant for bladder cancer, CAD, CABG x 2, hyperlipidemia, osteoporosis, TIA, atrial fibrillation anticoagulated with Plavix sent to the ED by EMS from [facility] urology where patient was being evaluated for hematuria.  A 22 F. Foley was placed in their office as they were unable to place a three-way hematuria catheter even with coud due to a narrow meatus.  Patient was irrigated with 2L and had significant clot evacuation which continues to persist.  Patient has been holding his Plavix since yesterday but has been bleeding for the past week.  Urology requested admission to hospital, stat labs, and continuous bladder irrigation.  Patient has no subjective complaint and states he is not in pain.         Home Medications Prior to Admission medications   Medication Sig Start Date End Date Taking? Authorizing Provider  acetaminophen (TYLENOL) 500 MG tablet Take 1,000 mg by mouth every 6 (six) hours as needed for mild pain.    [provider]  Cholecalciferol (VITAMIN D3) 50 MCG (2000 UT) CAPS Take 2,000 Units by mouth daily.    [provider]  clopidogrel (PLAVIX) 75 MG tablet Take 1 tablet (75 mg total) by mouth daily. Schedule an appointment for further refills 06/14/22   [name], MD  Cyanocobalamin (VITAMIN B12 PO) Place 5,000 mcg under the tongue daily.    [provider]  denosumab (PROLIA) 60 MG/ML SOSY injection Inject 60 mg into the skin every 6 (six) months. 11/10/19   [provider]  ezetimibe (ZETIA) 10 MG tablet Take 10 mg by mouth daily.    [provider]  fish oil-omega-3 fatty acids 1000 MG capsule Take 1 g by mouth daily.     [provider]  gabapentin  (NEURONTIN) 100 MG capsule Take 100 mg by mouth 2 (two) times daily.    [provider]  JARDIANCE 10 MG TABS tablet Take 10 mg by mouth daily. 04/18/21   [provider]  latanoprost (XALATAN) 0.005 % ophthalmic solution Place 1 drop into both eyes at bedtime. 04/02/22   [provider]  metoprolol succinate (TOPROL-XL) 25 MG 24 hr tablet Take 12.5 mg by mouth daily.    [provider]  Multiple Vitamin (MULTIVITAMIN) capsule Take 1 capsule by mouth at bedtime.     [provider]  rosuvastatin (CRESTOR) 40 MG tablet TAKE 1 TABLET BY MOUTH DAILY 09/12/21   Rollene Rotunda, MD  tamsulosin (FLOMAX) 0.4 MG CAPS capsule Take 0.4 mg by mouth at bedtime.    [provider]  timolol (TIMOPTIC) 0.5 % ophthalmic solution Place 1 drop into both eyes every morning. 04/02/22   [provider]      Allergies    Patient has no known allergies.    Review of Systems   Review of Systems  Genitourinary:  Positive for hematuria.  Neurological:  Positive for dizziness and weakness (generalized).    Physical Exam Updated Vital Signs BP 126/85 (BP Location: Left Arm)   Pulse 70   Temp (!) 97.4 F (36.3 C) (Oral)   Resp 19   SpO2 100%  Physical Exam Vitals and nursing note reviewed.  Constitutional:      General: He is  not in acute distress.    Appearance: Normal appearance. He is not ill-appearing or diaphoretic.  Cardiovascular:     Rate and Rhythm: Normal rate and regular rhythm.  Pulmonary:     Effort: Pulmonary effort is normal.  Abdominal:     General: Abdomen is flat.     Palpations: Abdomen is soft.     Tenderness: There is no abdominal tenderness.  Skin:    General: Skin is warm and dry.     Capillary Refill: Capillary refill takes less than 2 seconds.  Neurological:     Mental Status: He is alert. Mental status is at baseline.  Psychiatric:        Mood and Affect: Mood normal.        Behavior: Behavior normal.     ED  Results / Procedures / Treatments   Labs (all labs ordered are listed, but only abnormal results are displayed) Labs Reviewed  COMPREHENSIVE METABOLIC PANEL - Abnormal; Notable for the following components:      Result Value   Sodium 134 (*)    Glucose, Bld 211 (*)    BUN 33 (*)    Total Bilirubin 1.3 (*)    All other components within normal limits  CBC WITH DIFFERENTIAL/PLATELET  PROTIME-INR  COMPREHENSIVE METABOLIC PANEL  CBC  MAGNESIUM  PHOSPHORUS  HEMOGLOBIN A1C  TYPE AND SCREEN    EKG None  Radiology No results found.  Procedures Procedures    Medications Ordered in ED Medications  sodium chloride irrigation 0.9 % 3,000 mL (3,000 mLs Irrigation New Bag/Given 01/29/23 1655)  ezetimibe (ZETIA) tablet 10 mg (has no administration in time range)  metoprolol succinate (TOPROL-XL) 24 hr tablet 12.5 mg (has no administration in time range)  rosuvastatin (CRESTOR) tablet 40 mg (has no administration in time range)  tamsulosin (FLOMAX) capsule 0.4 mg (has no administration in time range)  0.9 %  sodium chloride infusion (has no administration in time range)  acetaminophen (TYLENOL) tablet 650 mg (has no administration in time range)    Or  acetaminophen (TYLENOL) suppository 650 mg (has no administration in time range)  docusate sodium (COLACE) capsule 100 mg (has no administration in time range)  ondansetron (ZOFRAN) tablet 4 mg (has no administration in time range)    Or  ondansetron (ZOFRAN) injection 4 mg (has no administration in time range)  cefTRIAXone (ROCEPHIN) 1 g in sodium chloride 0.9 % 100 mL IVPB (has no administration in time range)  insulin aspart (novoLOG) injection 0-6 Units (has no administration in time range)    ED Course/ Medical Decision Making/ A&P                             Medical Decision Making Amount and/or Complexity of Data Reviewed Labs: ordered.  Risk Prescription drug management.   This patient presents to the ED with chief  complaint(s) of gross hematuria with pertinent past medical history of Afib anticoagulated with Plavix, bladder cancer.  The complaint involves an extensive differential diagnosis and also carries with it a high risk of complications and morbidity.    The differential diagnosis includes bladder neoplasm, obstruction, infection, GU trauma   The initial plan is to obtain baseline labs  Additional history obtained: Records reviewed  - note from internal medicine provider at nursing home, patient was seen for bright red blood in urine x4 days with slight suprapubic discomfort.  Initial Assessment:   Exam significant for 89 year old male  patient who does not appear to be in acute distress.  Abdomen is soft, non tender, non distended.  There is gross hematuria present in Foley catheter placed by urology.  Independent ECG/labs interpretation:  The following labs were independently interpreted:  CBC without leukocytosis or anemia.  Metabolic panel without major electrolyte disturbance.  Renal and hepatic function are both normal.  PT/INR normal.  Consults Obtained:   Patient was evaluated by Dr. Di Kindle with urology while in the ED.  He was able to place a 3-way catheter and begin CBI.  Dr. Pete Glatter recommending holding Plavix and hospital admission for observation and CBI.  Dr. Pete Glatter also recommends empiric antibiotic therapy.    I requested consultation with on-call hospitalist and spoke with Dr. Willeen Niece who agrees with hospital admission.    Disposition:   Patient to be [facility] with urology following.          Final Clinical Impression(s) / ED Diagnoses Final diagnoses:  Gross hematuria    Rx / DC Orders ED Discharge Orders     None         Lenard Simmer, PA-C 01/29/23 1820    Charlynne Pander, MD 01/29/23 [phone]

## 2023-01-29 NOTE — Assessment & Plan Note (Signed)
GABG x2, post Op afib, hold Plavix 2/2 to hematuria, continue Metoprolol, Jardiance, and statin

## 2023-01-29 NOTE — Consult Note (Signed)
Urology Consult   Physician requesting consult: Melton Alar, PA-C  Reason for consult: Gross hematuria with clot retention  History of Present Illness: Tanner [name] is a 89 y.o. male seen in consultation for gross hematuria with clot retention.  He is followed by Dr. Mena Goes for a history of bladder cancer requiring multiple TURBTs.  He was last treated with a TURBT in October 2023.  Pathology showed noninvasive high-grade papillary urothelial carcinoma.  He was subsequently treated with BCG intravesically.  His last cystoscopy in February 2024 showed no evidence of recurrence.  He noted onset of gross hematuria approximately 1 week ago.  He denies any dysuria or flank pain.  He was seen today at [facility] for continued gross hematuria and lower abdominal pain.  A 22 French Foley catheter was placed and a significant amount of clot was irrigated from his bladder.  A three-way Foley catheter was unable to be placed in the office.  The patient had relief of his symptoms following catheter placement.  He did report some dizziness.  He was sent to the emergency room for further evaluation. He is on Plavix and his last dose was taken yesterday.   Past Medical History:  Diagnosis Date   Anticoagulant long-term use    plavix  (for hx TIA)   Arthritis    right shoulder    Benign essential tremor    hands -- occasional   CAD S/P percutaneous coronary angioplasty cardiologist-- dr harding   a) PCI and BMS to LAD in 1991; b) CATH 10/2010: Severe 2 Vessel disease = diffuse RCA ( 75% prox, 90% mid & 70 + 75% distal) & LAD (80-90% pre-stent, 75% post-stent) with occluded D1 (filled via OM-D1 collaterals)  -- CABG x 2; c) Myoview 4/'15: INTERMEDIATE RISK - small sized reversible apical/anterolateral defect -- most likely c/w known occlusion of D1 with collaterals.(med Rx)   History of basal cell carcinoma (BCC) excision    History of bladder cancer urologist-- dr eskridge   dx 11/ 2015-- s/p TURBT    History of kidney stones    History of TIA (transient ischemic attack) 12/09/2013   History of urethral stricture    Hyperlipidemia with target LDL less than 70    controlled.   Neuropathy of lower extremity    Nocturia    Osteoporosis    Peripheral neuropathy    lower extremity   Postoperative atrial fibrillation (HCC) 10/2010   after bypass-"shocked back in and no problems since":   per cardiologist note, [name], no recurrence   Pre-diabetes    S/P CABG x 2 10/18/2010   LIMA-LAD, SVG to RPDA (Dr. Cornelius Moras)   TGA (transient global amnesia)    "x3 for hours then goes away"   Transient Dilated idiopathic cardiomyopathy April 2015; 04/2014   a) in setting of TIA: EF 35-40% (reduced from 55-65 percent) mild AI, moderate and dilated aorta.;; b) Recheck Echo 04/2014: EF 55-60%, Gr 1 DD, mild-mod AI.     Wears glasses    Wears partial dentures    upper    Past Surgical History:  Procedure Laterality Date   APPENDECTOMY  as teen   BASAL CELL CARCINOMA EXCISION  07/2014   CARDIAC CATHETERIZATION  10/17/2010   80%prox w/mild in-stent restenosis w/75% mid-LADstenosis aft the stent,multi severe stenoses RCAw/heavily calcified vessel,norm LV   CARDIOVERSION  2012   after bypass surgery   CATARACT EXTRACTION W/ INTRAOCULAR LENS  IMPLANT, BILATERAL  2009  approx.   COCCYX REMOVAL  CORONARY ANGIOPLASTY WITH STENT PLACEMENT  1991   PCI and BMS to LAD   CORONARY ARTERY BYPASS GRAFT  10-18-2010  [name] @[facility]    LIMA-LAD, SVG-rPDA   CYSTOSCOPY W/ RETROGRADES N/A 07/12/2014   Procedure: CYSTOSCOPY WITH RETROGRADE PYELOGRAM;  Surgeon: [name], MD;  Location: [facility];  Service: Urology;  Laterality: N/A;   CYSTOSCOPY WITH BIOPSY N/A 09/09/2014   Procedure: CYSTO WITH BIOPSY AND FULGERATION, DILATION OF URETHRAL STRICTURE;  Surgeon: [name], MD;  Location: [facility];  Service: Urology;  Laterality: N/A;  BLADDER BIOPSY   CYSTOSCOPY WITH BIOPSY N/A 01/03/2015   Procedure: CYSTOSCOPY  WITH BIOPSY;  Surgeon: [name], MD;  Location: [facility];  Service: Urology;  Laterality: N/A;   CYSTOSCOPY WITH BIOPSY N/A 01/26/2019   Procedure: CYSTOSCOPY WITH BIOPSY, BILATERAL RETROGRADE PYELOGRAM;  Surgeon: [name], MD;  Location: [facility];  Service: Urology;  Laterality: N/A;   CYSTOSCOPY WITH FULGERATION N/A 01/26/2019   Procedure: CYSTOSCOPY WITH FULGERATION;  Surgeon: [name], MD;  Location: [facility];  Service: Urology;  Laterality: N/A;   CYSTOSCOPY WITH URETHRAL DILATATION N/A 01/03/2015   Procedure: CYSTOSCOPY WITH URETHRAL DILATATION;  Surgeon: [name], MD;  Location: [facility];  Service: Urology;  Laterality: N/A;   DOPPLER ECHOCARDIOGRAPHY  10/18/2004   EF 55-65%,BORDERLINE -enlarged aortic root,mild aortic insuff.,trace tricus.insuff.   FINGER SURGERY Left 03-17-2001   dr [name]  @[facility]    repair complex laceration injury 5th digit   KIDNEY STONE SURGERY  1989   NM MYOVIEW LTD  12/16/2013   INTERMEDIATE RISK test with small sized reversible perfusion defect in the apical anterolateral wall -- most likely consistent with known occlusion of D1 with collaterals.   shoulder surgery Right 1988   TONSILLECTOMY  as teen   TRANSTHORACIC ECHOCARDIOGRAM  April 2015   EF 35-40%, mild AI, moderately dilated ascending aorta   TRANSURETHRAL RESECTION OF BLADDER TUMOR Right 06/11/2022   Procedure: TRANSURETHRAL RESECTION OF BLADDER TUMOR (TURBT)  >5cm;  Surgeon: [name], MD;  Location: [facility];  Service: Urology;  Laterality: Right;   TRANSURETHRAL RESECTION OF PROSTATE N/A 07/12/2014   Procedure: TRANSURETHRAL RESECTION OF THE PROSTATE (TURP) WITH MITOMYCIN -C;  Surgeon: [name], MD;  Location: [facility];  Service: Urology;  Laterality: N/A;   TRANSURETHRAL RESECTION OF PROSTATE N/A 06/11/2022   Procedure: TRANSURETHRAL RESECTION OF THE PROSTATE (TURP);  Surgeon: [name], MD;  Location: [facility];  Service:  Urology;  Laterality: N/A;  REQUESTING 2 HRS    Medications:  Scheduled Meds: Continuous Infusions:  sodium chloride irrigation     PRN Meds:.  Allergies: No Known Allergies  Family History  Problem Relation Age of Onset   Heart Problems Mother    Parkinson's disease Father    Heart Problems Father     Social History:  reports that he quit smoking about 65 years ago. His smoking use included cigarettes. He has a 5.00 pack-year smoking history. He has never used smokeless tobacco. He reports that he does not drink alcohol and does not use drugs.  ROS: A complete review of systems was performed.  All systems are negative except for pertinent findings as noted.  Physical Exam:  Vital signs in last 24 hours: Temp:  [97.2 F (36.2 C)-97.4 F (36.3 C)] 97.4 F (36.3 C) (05/29 1535) Pulse Rate:  [70] 70 (05/29 1535) Resp:  [16-19] 19 (05/29 1535) BP: (112-126)/(70-85) 126/85 (05/29 1535) SpO2:  [95 %-100 %] 100 % (05/29 1535)  GENERAL APPEARANCE:  Well appearing, well developed, well nourished, NAD HEENT:  Atraumatic, normocephalic, oropharynx clear NECK:  Supple without lymphadenopathy or thyromegaly ABDOMEN:  Soft, non-tender, no masses EXTREMITIES:  Moves all extremities well, without clubbing, cyanosis, or edema NEUROLOGIC:  Alert and oriented x 3, CN II-XII grossly intact MENTAL STATUS:  appropriate BACK:  Non-tender to palpation, No CVAT SKIN:  Warm, dry, and intact GU:  2 way foley in place draining bloody urine; uncircumcised  Laboratory Data:  Recent Labs    01/29/23 1606  WBC 8.7  HGB 13.3  HCT 41.1  PLT 161    Recent Labs    01/29/23 1606  NA 134*  K 4.7  CL 102  GLUCOSE 211*  BUN 33*  CALCIUM 9.5  CREATININE 0.98     Results for orders placed or performed during the hospital encounter of 01/29/23 (from the past 24 hour(s))  Comprehensive metabolic panel     Status: Abnormal   Collection Time: 01/29/23  4:06 PM  Result Value Ref Range    Sodium 134 (L) 135 - 145 mmol/L   Potassium 4.7 3.5 - 5.1 mmol/L   Chloride 102 98 - 111 mmol/L   CO2 22 22 - 32 mmol/L   Glucose, Bld 211 (H) 70 - 99 mg/dL   BUN 33 (H) 8 - 23 mg/dL   Creatinine, Ser 1.61 0.61 - 1.24 mg/dL   Calcium 9.5 8.9 - 09.6 mg/dL   Total Protein 7.4 6.5 - 8.1 g/dL   Albumin 3.9 3.5 - 5.0 g/dL   AST 30 15 - 41 U/L   ALT 33 0 - 44 U/L   Alkaline Phosphatase 42 38 - 126 U/L   Total Bilirubin 1.3 (H) 0.3 - 1.2 mg/dL   GFR, Estimated >04 >54 mL/min   Anion gap 10 5 - 15  CBC with Differential     Status: None   Collection Time: 01/29/23  4:06 PM  Result Value Ref Range   WBC 8.7 4.0 - 10.5 K/uL   RBC 4.22 4.22 - 5.81 MIL/uL   Hemoglobin 13.3 13.0 - 17.0 g/dL   HCT 09.8 11.9 - 14.7 %   MCV 97.4 80.0 - 100.0 fL   MCH 31.5 26.0 - 34.0 pg   MCHC 32.4 30.0 - 36.0 g/dL   RDW 82.9 56.2 - 13.0 %   Platelets 161 150 - 400 K/uL   nRBC 0.0 0.0 - 0.2 %   Neutrophils Relative % 81 %   Neutro Abs 7.2 1.7 - 7.7 K/uL   Lymphocytes Relative 12 %   Lymphs Abs 1.0 0.7 - 4.0 K/uL   Monocytes Relative 5 %   Monocytes Absolute 0.5 0.1 - 1.0 K/uL   Eosinophils Relative 1 %   Eosinophils Absolute 0.1 0.0 - 0.5 K/uL   Basophils Relative 0 %   Basophils Absolute 0.0 0.0 - 0.1 K/uL   Immature Granulocytes 1 %   Abs Immature Granulocytes 0.04 0.00 - 0.07 K/uL  Protime-INR     Status: None   Collection Time: 01/29/23  4:06 PM  Result Value Ref Range   Prothrombin Time 14.1 11.4 - 15.2 seconds   INR 1.1 0.8 - 1.2  Type and screen [facility]     Status: None   Collection Time: 01/29/23  4:06 PM  Result Value Ref Range   ABO/RH(D) A POS    Antibody Screen NEG    Sample Expiration      02/01/2023,2359 Performed at [facility], [address] [place], [place] [address]    No results found for this or any previous visit (from the past 240 hour(s)).  Renal Function: Recent Labs    01/29/23 1606  CREATININE 0.98   CrCl cannot be  calculated (Unknown ideal weight.).  Radiologic Imaging: No results found.  Procedure: Foley insertion and bladder irrigation  Due to the patient's ongoing gross hematuria, I recommended placement of a three-way Foley catheter and continuous bladder irrigation.  The patient's 22 French Foley catheter was removed.  Under sterile conditions, a 24 French hematuria catheter was placed without difficulty.  The catheter was irrigated with return of a small amount of clot.  Continuous bladder irrigation was then started.  The urine was light pink with a slow CBI rate.  He tolerated the procedure well.  Impression/Recommendation Gross hematuria with clot retention History of bladder cancer Chronic anticoagulation with Plavix  Continue three-way Foley catheter with bladder irrigation to keep urine clear. May hand irrigate as needed. Recommend empiric antibiotic therapy  Hold Plavix Will follow He has outpatient cystoscopy already scheduled for next week with Dr. Jaclyn Shaggy [facility] 01/29/2023, 5:38 PM

## 2023-01-29 NOTE — ED Triage Notes (Addendum)
Pt was sent by ems from [facility] where he was seen for hematuria.  A 67F foley was placed in their office as they were unable to place a three way hematuria catheter with coude due to narrow meatus.  He was irrigated with 2L and had significatn clot evacuation which persists.  The pt has been holding his anticoagulation since yesterday but has been blooding for the past week and feels weak and dizzy.  Urology is requesting admission and stat labs including H&H and CBI.

## 2023-01-29 NOTE — Assessment & Plan Note (Signed)
on Gabapentin, ambulates with walker.  

## 2023-01-29 NOTE — Assessment & Plan Note (Signed)
R+L buttocks, pressure reduction, barrier ointment for now

## 2023-01-29 NOTE — Assessment & Plan Note (Addendum)
bright blood in urine x4, may a day or two, slightly suprapubic discomfort, but denied dysuria, increased urinary frequency, urgency, or incontinence. He is afebrile, in his usual state of health. The patient has history of bladder cancer, will see urology later today. The patient declined ED eval, desires staying [facility], will obtain UA C/S, CBC/diff, CMP/eGFR, hold Plavix for now.  01/29/23 wbc 6.6, Hgb 13.0, plt 151, neutrophils 76.8%, Na 137, K 4.5, Bun 28, creat 1.15

## 2023-01-29 NOTE — Assessment & Plan Note (Signed)
Heart rate is in control, on Metoprolol, TSH 2.91 01/02/23

## 2023-01-29 NOTE — Assessment & Plan Note (Signed)
Hgb A1c 8.2 01/02/23, started Meformin

## 2023-01-30 DIAGNOSIS — R310 Gross hematuria: Secondary | ICD-10-CM | POA: Diagnosis not present

## 2023-01-30 LAB — COMPREHENSIVE METABOLIC PANEL
ALT: 24 U/L (ref 0–44)
AST: 22 U/L (ref 15–41)
Albumin: 2.9 g/dL — ABNORMAL LOW (ref 3.5–5.0)
Alkaline Phosphatase: 33 U/L — ABNORMAL LOW (ref 38–126)
Anion gap: 8 (ref 5–15)
BUN: 26 mg/dL — ABNORMAL HIGH (ref 8–23)
CO2: 20 mmol/L — ABNORMAL LOW (ref 22–32)
Calcium: 8.5 mg/dL — ABNORMAL LOW (ref 8.9–10.3)
Chloride: 108 mmol/L (ref 98–111)
Creatinine, Ser: 0.88 mg/dL (ref 0.61–1.24)
GFR, Estimated: >60 mL/min (ref >60)
Glucose, Bld: 135 mg/dL — ABNORMAL HIGH (ref 70–99)
Potassium: 3.6 mmol/L (ref 3.5–5.1)
Sodium: 136 mmol/L (ref 135–145)
Total Bilirubin: 1.2 mg/dL (ref 0.3–1.2)
Total Protein: 5.6 g/dL — ABNORMAL LOW (ref 6.5–8.1)

## 2023-01-30 LAB — HEMOGLOBIN A1C
Hgb A1c MFr Bld: 7.8 % — ABNORMAL HIGH (ref 4.8–5.6)
Mean Plasma Glucose: 177 mg/dL

## 2023-01-30 LAB — CBC
HCT: 31.1 % — ABNORMAL LOW (ref 39.0–52.0)
Hemoglobin: 10.2 g/dL — ABNORMAL LOW (ref 13.0–17.0)
MCH: 31.8 pg (ref 26.0–34.0)
MCHC: 32.8 g/dL (ref 30.0–36.0)
MCV: 96.9 fL (ref 80.0–100.0)
Platelets: 109 10*3/uL — ABNORMAL LOW (ref 150–400)
RBC: 3.21 MIL/uL — ABNORMAL LOW (ref 4.22–5.81)
RDW: 12.6 % (ref 11.5–15.5)
WBC: 5.9 10*3/uL (ref 4.0–10.5)
nRBC: 0 % (ref 0.0–0.2)

## 2023-01-30 LAB — GLUCOSE, CAPILLARY
Glucose-Capillary: 129 mg/dL — ABNORMAL HIGH (ref 70–99)
Glucose-Capillary: 288 mg/dL — ABNORMAL HIGH (ref 70–99)
Glucose-Capillary: 127 mg/dL — ABNORMAL HIGH (ref 70–99)
Glucose-Capillary: 133 mg/dL — ABNORMAL HIGH (ref 70–99)

## 2023-01-30 LAB — PHOSPHORUS: Phosphorus: 3.4 mg/dL (ref 2.5–4.6)

## 2023-01-30 LAB — MAGNESIUM: Magnesium: 2.1 mg/dL (ref 1.7–2.4)

## 2023-01-30 MED ORDER — INSULIN ASPART 100 UNIT/ML IJ SOLN
3.0000 [IU] | Freq: Three times a day (TID) | INTRAMUSCULAR | Status: DC
  Administered 2023-01-30 – 2023-02-01 (×7): 3 [IU] via SUBCUTANEOUS

## 2023-01-30 MED ORDER — INSULIN ASPART 100 UNIT/ML IJ SOLN: 0.0000 [IU] | Freq: Every day | INTRAMUSCULAR | Status: DC

## 2023-01-30 MED ORDER — INSULIN ASPART 100 UNIT/ML IJ SOLN
0.0000 [IU] | Freq: Three times a day (TID) | INTRAMUSCULAR | Status: DC
  Administered 2023-01-30: 5 [IU] via SUBCUTANEOUS
  Administered 2023-01-30 (×2): 1 [IU] via SUBCUTANEOUS
  Administered 2023-01-31 – 2023-02-01 (×3): 2 [IU] via SUBCUTANEOUS

## 2023-01-30 MED ORDER — GABAPENTIN 100 MG PO CAPS
100.0000 mg | ORAL_CAPSULE | Freq: Two times a day (BID) | ORAL | Status: DC
  Administered 2023-01-30 – 2023-02-01 (×6): 100 mg via ORAL
  Filled 2023-01-30 (×6): qty 1

## 2023-01-30 NOTE — Progress Notes (Signed)
Urology Inpatient Progress Note  Subjective: No acute events overnight. Continues on CBI.  Urine light pink. Nursing staff has irrigated catheter 3 times with return of small clots. Patient comfortable.  Anti-infectives: Anti-infectives (From admission, onward)    Start     Dose/Rate Route Frequency Ordered Stop   01/29/23 2000  cefTRIAXone (ROCEPHIN) 1 g in sodium chloride 0.9 % 100 mL IVPB        1 g 200 mL/hr over 30 Minutes Intravenous Every 24 hours 01/29/23 1813         Current Facility-Administered Medications  Medication Dose Route Frequency Provider Last Rate Last Admin   0.9 %  sodium chloride infusion   Intravenous Continuous Willeen Niece, MD 50 mL/hr at 01/29/23 2217 New Bag at 01/29/23 2217   acetaminophen (TYLENOL) tablet 650 mg  650 mg Oral Q6H PRN Willeen Niece, MD   650 mg at 01/30/23 0031   Or   acetaminophen (TYLENOL) suppository 650 mg  650 mg Rectal Q6H PRN Willeen Niece, MD       cefTRIAXone (ROCEPHIN) 1 g in sodium chloride 0.9 % 100 mL IVPB  1 g Intravenous Q24H Willeen Niece, MD 200 mL/hr at 01/29/23 2259 1 g at 01/29/23 2259   cholecalciferol (VITAMIN D3) 25 MCG (1000 UNIT) tablet 2,000 Units  2,000 Units Oral Daily Willeen Niece, MD       docusate sodium (COLACE) capsule 100 mg  100 mg Oral BID Willeen Niece, MD   100 mg at 01/29/23 2212   ezetimibe (ZETIA) tablet 10 mg  10 mg Oral Daily Willeen Niece, MD       gabapentin (NEURONTIN) capsule 100 mg  100 mg Oral BID Anthoney Harada, NP   100 mg at 01/30/23 0030   insulin aspart (novoLOG) injection 0-6 Units  0-6 Units Subcutaneous TID WC Willeen Niece, MD       metoprolol succinate (TOPROL-XL) 24 hr tablet 12.5 mg  12.5 mg Oral Daily Willeen Niece, MD       ondansetron (ZOFRAN) tablet 4 mg  4 mg Oral Q6H PRN Willeen Niece, MD       Or   ondansetron (ZOFRAN) injection 4 mg  4 mg Intravenous Q6H PRN Willeen Niece, MD       rosuvastatin (CRESTOR) tablet 40 mg  40 mg Oral Daily Idelle Leech,  Pardeep, MD       sodium chloride irrigation 0.9 % 3,000 mL  3,000 mL Irrigation Continuous Clark, Meghan R, PA-C   3,000 mL at 01/30/23 0257   tamsulosin (FLOMAX) capsule 0.4 mg  0.4 mg Oral Daily Willeen Niece, MD   0.4 mg at 01/29/23 2212   vitamin B-12 (CYANOCOBALAMIN) tablet 100 mcg  100 mcg Oral Daily Willeen Niece, MD       Facility-Administered Medications Ordered in Other Encounters  Medication Dose Route Frequency Provider Last Rate Last Admin   gemcitabine (GEMZAR) chemo syringe for bladder instillation 2,000 mg  2,000 mg Bladder Instillation Once [name], MD         Objective: Vital signs in last 24 hours: Temp:  [97.2 F (36.2 C)-98.4 F (36.9 C)] 97.8 F (36.6 C) (05/30 0106) Pulse Rate:  [70-95] 71 (05/30 0546) Resp:  [16-19] 18 (05/30 0546) BP: (109-135)/(63-89) 116/71 (05/30 0546) SpO2:  [93 %-100 %] 97 % (05/30 0546) Weight:  [67.7 kg] 67.7 kg (05/29 2109)  Intake/Output from previous day: 05/29 0701 - 05/30 0700 In: 15623.3 [P.O.:240; I.V.:272.8; IV Piggyback:110.5] Out: 91478 [Urine:17400] Intake/Output this shift: Total I/O In:  15623.3 [P.O.:240; I.V.:272.8; Other:15000; IV Piggyback:110.5] Out: [address] [Urine:17400] GENERAL APPEARANCE:  Well appearing, well developed, well nourished, NAD HEENT:  Atraumatic, normocephalic, oropharynx clear NECK:  Supple without lymphadenopathy or thyromegaly ABDOMEN:  Soft, non-tender, no masses EXTREMITIES:  Moves all extremities well, without clubbing, cyanosis, or edema NEUROLOGIC:  Alert and oriented x 3, CN II-XII grossly intact MENTAL STATUS:  appropriate BACK:  Non-tender to palpation, No CVAT SKIN:  Warm, dry, and intact GU:  foley draining light pink urine   Lab Results:  Recent Labs    01/29/23 1606 01/30/23 0508  WBC 8.7 5.9  HGB 13.3 10.2*  HCT 41.1 31.1*  PLT 161 109*   BMET Recent Labs    01/29/23 1606  NA 134*  K 4.7  CL 102  CO2 22  GLUCOSE 211*  BUN 33*  CREATININE 0.98   CALCIUM 9.5   PT/INR Recent Labs    01/29/23 1606  LABPROT 14.1  INR 1.1   ABG No results for input(s): "PHART", "HCO3" in the last 72 hours.  Invalid input(s): "PCO2", "PO2"  Studies/Results: No results found.   Assessment & Plan: Gross hematuria with clot retention History of bladder cancer Chronic anticoagulation with Plavix  Foley irrigated without return of clots Continue CBI at rate to keep urine light pink Hold Plavix Will follow   Di Kindle, MD 01/30/2023

## 2023-01-30 NOTE — TOC Initial Note (Signed)
Transition of Care [facility]) - Initial/Assessment Note    Patient Details  Name: Tanner [name] MRN: [id number] Date of Birth: 05-13-1924  Transition of [facility]) CM/SW Contact:    Howell Rucks, RN Phone Number: 01/30/2023, 10:30 AM  Clinical Narrative:  Met with pt at bedside to introduce role of TOC/NCM and review for dc needs, pt hard of hearing, requested I call his nephew, Ree Kida. NCM outreached to Ree Kida, introduce of TOC/NCM and to review for [employer], Ree Kida confirmed pt resides in [facility], has nurse to assist with ADL's, has rw. Plan to return at discharge. TOC will continue to follow.   -10:32 NCM called to Miquel Dunn at [facility], [phone], Florentina Addison confirmed pt is a resident in ALF. Katie requesting PT eval for possible short term rehab recommendation. NCM will request PT eval order from attending.                  Expected Discharge Plan: Assisted Living Barriers to Discharge: Continued Medical Work up   Patient Goals and CMS Choice            Expected Discharge Plan and Services   Discharge Planning Services: CM Consult   Living arrangements for the past 2 months: Assisted Living Facility                                      Prior Living Arrangements/Services Living arrangements for the past 2 months: Assisted Living Facility Lives with:: Self Patient language and need for interpreter reviewed:: Yes Do you feel safe going back to the place where you live?: Yes      Need for Family Participation in Patient Care: Yes (Comment) Care giver support system in place?: Yes (comment) Current home services: DME (walker) Criminal Activity/Legal Involvement Pertinent to Current Situation/Hospitalization: No - Comment as needed  Activities of Daily Living Home Assistive Devices/Equipment: Eyeglasses, Walker (specify type) ADL Screening (condition at time of admission) Patient's cognitive ability adequate to safely complete daily activities?: No Is  the patient deaf or have difficulty hearing?: Yes Does the patient have difficulty seeing, even when wearing glasses/contacts?: No Does the patient have difficulty concentrating, remembering, or making decisions?: Yes Patient able to express need for assistance with ADLs?: Yes Does the patient have difficulty dressing or bathing?: Yes Independently performs ADLs?: No Communication: Independent Dressing (OT): Needs assistance Grooming: Needs assistance Feeding: Independent Bathing: Needs assistance Toileting: Needs assistance In/Out Bed: Needs assistance Walks in Home: Needs assistance Does the patient have difficulty walking or climbing stairs?: Yes Weakness of Legs: Both Weakness of Arms/Hands: None  Permission Sought/Granted Permission sought to share information with : Case Manager Permission granted to share information with : Yes, Verbal Permission Granted  Share Information with NAME: Fannie Knee, RN           Emotional Assessment Appearance:: Appears stated age Attitude/Demeanor/Rapport: Gracious Affect (typically observed): Accepting Orientation: : Oriented to Self, Oriented to Place, Oriented to  Time, Oriented to Situation Alcohol / Substance Use: Not Applicable Psych Involvement: No (comment)  Admission diagnosis:  Gross hematuria [R31.0] Patient Active Problem List   Diagnosis Date Noted   Hematuria 01/29/2023   Pressure ulcer, stage 2 (HCC) 01/29/2023   History of bladder cancer 01/29/2023   Chronic anticoagulation 01/29/2023   Gross hematuria 01/29/2023   Sore gums 01/10/2023   Urinary frequency 01/01/2023   Vitamin D deficiency 01/01/2023  Vitamin B12 deficiency 01/01/2023   Glaucoma 01/01/2023   Paroxysmal tachycardia (HCC) 06/14/2021   Sepsis secondary to UTI (HCC) 03/23/2019   Hypokalemia 03/23/2019   Type 2 diabetes mellitus (HCC) 03/23/2019   Two-vessel CAD status post CABG x2;     Bladder neoplasm 07/12/2014   Sinus bradycardia 07/12/2014    Hereditary and idiopathic peripheral neuropathy 01/20/2014   Transient Congestive dilated cardiomyopathy 12/11/2013   TIA (transient ischemic attack) 12/09/2013   Thrombocytopenia (HCC) 12/09/2013   Dyslipidemia, goal LDL below 70 03/16/2013   Mild aortic insufficiency 02/08/2013   AF (atrial fibrillation) -- Post Operative    Presence of bare metal stent in LAD coronary artery: With significant stenosis on either end of the stent 03/17/2011   PCP:  [name], MD Pharmacy:   [facility] [phone] - Closed [place] Keys, [place] - [phone] AT [facility] [address] [place] [address] Phone: [phone] Fax: [phone]  [facility] [address] [place], [place] - [address] [place] [address] Phone: [phone] Fax: [phone]     Social Determinants of Health (SDOH) Social History: SDOH Screenings   Tobacco Use: Medium Risk (01/29/2023)   SDOH Interventions:     Readmission Risk Interventions    01/30/2023   10:20 AM  Readmission Risk Prevention Plan  Transportation Screening Complete  PCP or Specialist Appt within 5-7 Days Complete  Home Care Screening Complete  Medication Review (RN CM) Complete

## 2023-01-30 NOTE — Progress Notes (Addendum)
Urology Inpatient Progress Note  Subjective: No acute events overnight.  Was sleeping on rounds, I did not wake him.  Anti-infectives: Anti-infectives (From admission, onward)    Start     Dose/Rate Route Frequency Ordered Stop   01/29/23 2000  cefTRIAXone (ROCEPHIN) 1 g in sodium chloride 0.9 % 100 mL IVPB        1 g 200 mL/hr over 30 Minutes Intravenous Every 24 hours 01/29/23 1813         Current Facility-Administered Medications  Medication Dose Route Frequency Provider Last Rate Last Admin   0.9 %  sodium chloride infusion   Intravenous Continuous Willeen Niece, MD 50 mL/hr at 01/29/23 2217 New Bag at 01/29/23 2217   acetaminophen (TYLENOL) tablet 650 mg  650 mg Oral Q6H PRN Willeen Niece, MD   650 mg at 01/30/23 9147   Or   acetaminophen (TYLENOL) suppository 650 mg  650 mg Rectal Q6H PRN Willeen Niece, MD       cefTRIAXone (ROCEPHIN) 1 g in sodium chloride 0.9 % 100 mL IVPB  1 g Intravenous Q24H Willeen Niece, MD 200 mL/hr at 01/29/23 2259 1 g at 01/29/23 2259   cholecalciferol (VITAMIN D3) 25 MCG (1000 UNIT) tablet 2,000 Units  2,000 Units Oral Daily Willeen Niece, MD   2,000 Units at 01/30/23 0936   docusate sodium (COLACE) capsule 100 mg  100 mg Oral BID Willeen Niece, MD   100 mg at 01/30/23 0936   ezetimibe (ZETIA) tablet 10 mg  10 mg Oral Daily Willeen Niece, MD   10 mg at 01/30/23 0935   gabapentin (NEURONTIN) capsule 100 mg  100 mg Oral BID Anthoney Harada, NP   100 mg at 01/30/23 0935   insulin aspart (novoLOG) injection 0-5 Units  0-5 Units Subcutaneous QHS Marinda Elk, MD       insulin aspart (novoLOG) injection 0-9 Units  0-9 Units Subcutaneous TID WC Marinda Elk, MD   1 Units at 01/30/23 0853   insulin aspart (novoLOG) injection 3 Units  3 Units Subcutaneous TID WC Marinda Elk, MD   3 Units at 01/30/23 0854   metoprolol succinate (TOPROL-XL) 24 hr tablet 12.5 mg  12.5 mg Oral Daily Willeen Niece, MD   12.5 mg at 01/30/23  0937   ondansetron (ZOFRAN) tablet 4 mg  4 mg Oral Q6H PRN Willeen Niece, MD       Or   ondansetron (ZOFRAN) injection 4 mg  4 mg Intravenous Q6H PRN Willeen Niece, MD       rosuvastatin (CRESTOR) tablet 40 mg  40 mg Oral Daily Willeen Niece, MD   40 mg at 01/30/23 0935   sodium chloride irrigation 0.9 % 3,000 mL  3,000 mL Irrigation Continuous Clark, Meghan R, PA-C   3,000 mL at 01/30/23 8295   tamsulosin (FLOMAX) capsule 0.4 mg  0.4 mg Oral Daily Willeen Niece, MD   0.4 mg at 01/30/23 6213   vitamin B-12 (CYANOCOBALAMIN) tablet 100 mcg  100 mcg Oral Daily Willeen Niece, MD   100 mcg at 01/30/23 0865   Facility-Administered Medications Ordered in Other Encounters  Medication Dose Route Frequency Provider Last Rate Last Admin   gemcitabine (GEMZAR) chemo syringe for bladder instillation 2,000 mg  2,000 mg Bladder Instillation Once [name], MD         Objective: Vital signs in last 24 hours: Temp:  [97.2 F (36.2 C)-98.4 F (36.9 C)] 97.6 F (36.4 C) (05/30 0934) Pulse Rate:  [70-95] 83 (05/30  9629) Resp:  [16-20] 20 (05/30 0934) BP: (105-135)/(63-89) 105/76 (05/30 0934) SpO2:  [93 %-100 %] 98 % (05/30 0934) Weight:  [67.7 kg] 67.7 kg (05/29 2109)  Intake/Output from previous day: 05/29 0701 - 05/30 0700 In: 15623.3 [P.O.:240; I.V.:272.8; IV Piggyback:110.5] Out: 52841 [Urine:20700] Intake/Output this shift: Total I/O In: 360 [P.O.:360] Out: 2600 [Urine:2600] GENERAL APPEARANCE:  Well appearing, well developed, well nourished, NAD HEENT:  Atraumatic, normocephalic, oropharynx clear NECK:  Supple without lymphadenopathy or thyromegaly ABDOMEN:  Soft, non-tender, no masses EXTREMITIES:  Moves all extremities well, without clubbing, cyanosis, or edema NEUROLOGIC:  Alert and oriented x 3, CN II-XII grossly intact MENTAL STATUS:  appropriate BACK:  Non-tender to palpation, No CVAT SKIN:  Warm, dry, and intact GU: CBI on medium gtt., foley draining light pink  urine   Lab Results:  Recent Labs    01/29/23 1606 01/30/23 0508  WBC 8.7 5.9  HGB 13.3 10.2*  HCT 41.1 31.1*  PLT 161 109*   BMET Recent Labs    01/29/23 1606 01/30/23 0508  NA 134* 136  K 4.7 3.6  CL 102 108  CO2 22 20*  GLUCOSE 211* 135*  BUN 33* 26*  CREATININE 0.98 0.88  CALCIUM 9.5 8.5*   PT/INR Recent Labs    01/29/23 1606  LABPROT 14.1  INR 1.1   ABG No results for input(s): "PHART", "HCO3" in the last 72 hours.  Invalid input(s): "PCO2", "PO2"  Studies/Results: No results found.   Assessment & Plan: #Gross hematuria with clot retention #History of bladder cancer #Chronic anticoagulation with Plavix  Hold Plavix CBI clear irrigant in tubing.  Slowed from medium to slow gtt on rounds this morning. Has made a fair amount of improvement overnight.  Expect at this rate he will resolve without intervention after his Plavix washes out. Empiric ABX.  No urinalysis available Interval drop in hemoglobin from 13.3-->10.2 Will follow along.    Scherrie Bateman Arnesia Vincelette, NP 01/30/2023

## 2023-01-30 NOTE — Progress Notes (Signed)
[facility] PROGRESS NOTE    Progress Note  Tanner [name]  PNT:[id number] DOB: 1924/03/10 DOA: 01/29/2023 PCP: [name], MD     Brief Narrative:   Tanner [name] is an 89 y.o. male past medical history significant for bladder cancer requiring multiple TURBT's  history of of CAD, hyperlipidemia, diabetes mellitus type 2 presented to the urology's office with gross hematuria and lower abdominal pain that started 1 week prior to admission, they were not able to place a three-way Foley in the office presented to the ED start empirically on antibiotics CBI was started   Assessment/Plan:   Gross hematuria in the setting of a history of bladder cancer: Underwent bladder irrigation with clot removal by urology on the day of admission. Plavix was held started on IV fluids and empiric antibiotics. Urology recommended to continue CBI and hold Plavix. Continue current management. Continue Flomax  History of bladder cancer: Treated with intravesical BCG follow-up with urology as an outpatient.  History of CAD status post CABG: Holding Plavix in setting of hematuria. Continue Ruba statin and metoprolol.  Hyperlipidemia: Continue statins.  Diabetes mellitus type 2: Holding oral hypoglycemic agents. A1c of 7.8 continue sliding scale insulin he is currently on a diet.  History of paroxysmal tachycardia and postoperative A-fib: Continue metoprolol currently in sinus rhythm.  Vitamin D deficiency: Continue supplementation.  B12 deficiency: Continue supplementation.   Dyslipidemia, goal LDL below 70  Stage II sacral decubitus ulcer present on admission RN Pressure Injury Documentation: Pressure Injury 01/29/23 Buttocks Right;Left Stage 2 -  Partial thickness loss of dermis presenting as a shallow open injury with a red, pink wound bed without slough. (Active)  01/29/23 2130  Location: Buttocks  Location Orientation: Right;Left  Staging: Stage 2 -  Partial thickness loss  of dermis presenting as a shallow open injury with a red, pink wound bed without slough.  Wound Description (Comments):   Present on Admission: Yes  Dressing Type Foam - Lift dressing to assess site every shift 01/29/23 2140     Pressure Injury 01/29/23 Ischial tuberosity Right Stage 1 -  Intact skin with non-blanchable redness of a localized area usually over a bony prominence. (Active)  01/29/23 2130  Location: Ischial tuberosity  Location Orientation: Right  Staging: Stage 1 -  Intact skin with non-blanchable redness of a localized area usually over a bony prominence.  Wound Description (Comments):   Present on Admission: Yes  Dressing Type Foam - Lift dressing to assess site every shift 01/29/23 2140    Estimated body mass index is 22.04 kg/m as calculated from the following:   Height as of this encounter: 5\' 9"  (1.753 m).   Weight as of this encounter: 67.7 kg.   DVT prophylaxis: SCD Family Communication:none Status is: Inpatient Remains inpatient appropriate because: Hematuria    Code Status:     Code Status Orders  (From admission, onward)           Start     Ordered   01/29/23 1811  Full code  Continuous       Question:  By:  Answer:  Consent: discussion documented in EHR   01/29/23 1812           Code Status History     Date Active Date Inactive Code Status Order ID Comments User Context   03/23/2019 1705 03/28/2019 1911 Full Code [id number]  Hughie Closs, MD Inpatient   07/12/2014 1645 07/13/2014 1426 Full Code [id number]  [name], MD Inpatient  12/09/2013 1821 12/11/2013 1452 Full Code [id number]  Leatha Gilding, MD ED      Advance Directive Documentation    Flowsheet Row Most Recent Value  Type of Advance Directive Out of facility DNR (pink MOST or yellow form)  Pre-existing out of facility DNR order (yellow form or pink MOST form) --  "MOST" Form in Place? --         IV Access:   Peripheral IV   Procedures and diagnostic  studies:   No results found.   Medical Consultants:   None.   Subjective:    Tanner [name] has no complaint he is hungry  Objective:    Vitals:   01/29/23 2030 01/29/23 2109 01/30/23 0106 01/30/23 0546  BP: 135/88 133/80 109/63 116/71  Pulse: 71 80 73 71  Resp: 17 18 17 18   Temp:  98 F (36.7 C) 97.8 F (36.6 C) 98.2 F (36.8 C)  TempSrc:  Oral Oral Oral  SpO2: 93% 98% 98% 97%  Weight:  67.7 kg    Height:  5\' 9"  (1.753 m)     SpO2: 97 %   Intake/Output Summary (Last 24 hours) at 01/30/2023 0725 Last data filed at 01/30/2023 0655 Gross per 24 hour  Intake 15623.31 ml  Output 40981 ml  Net -5076.69 ml   Filed Weights   01/29/23 2109  Weight: 67.7 kg    Exam: General exam: In no acute distress. Respiratory system: Good air movement and clear to auscultation. Cardiovascular system: S1 & S2 heard, RRR. No JVD. Gastrointestinal system: Abdomen is nondistended, soft and nontender.  Extremities: No pedal edema. Skin: No rashes, lesions or ulcers Psychiatry: Judgement and insight appear normal. Mood & affect appropriate.    Data Reviewed:    Labs: Basic Metabolic Panel: Recent Labs  Lab 01/29/23 1606 01/30/23 0508  NA 134* 136  K 4.7 3.6  CL 102 108  CO2 22 20*  GLUCOSE 211* 135*  BUN 33* 26*  CREATININE 0.98 0.88  CALCIUM 9.5 8.5*  MG  --  2.1  PHOS  --  3.4   GFR Estimated Creatinine Clearance: 45.9 mL/min (by C-G formula based on SCr of 0.88 mg/dL). Liver Function Tests: Recent Labs  Lab 01/29/23 1606 01/30/23 0508  AST 30 22  ALT 33 24  ALKPHOS 42 33*  BILITOT 1.3* 1.2  PROT 7.4 5.6*  ALBUMIN 3.9 2.9*   No results for input(s): "LIPASE", "AMYLASE" in the last 168 hours. No results for input(s): "AMMONIA" in the last 168 hours. Coagulation profile Recent Labs  Lab 01/29/23 1606  INR 1.1   COVID-19 Labs  No results for input(s): "DDIMER", "FERRITIN", "LDH", "CRP" in the last 72 hours.  Lab Results  Component Value Date    SARSCOV2NAA NEGATIVE 03/23/2019   SARSCOV2NAA NOT DETECTED 01/22/2019    CBC: Recent Labs  Lab 01/29/23 1606 01/30/23 0508  WBC 8.7 5.9  NEUTROABS 7.2  --   HGB 13.3 10.2*  HCT 41.1 31.1*  MCV 97.4 96.9  PLT 161 109*   Cardiac Enzymes: No results for input(s): "CKTOTAL", "CKMB", "CKMBINDEX", "TROPONINI" in the last 168 hours. BNP (last 3 results) No results for input(s): "PROBNP" in the last 8760 hours. CBG: Recent Labs  Lab 01/29/23 2200  GLUCAP 141*   D-Dimer: No results for input(s): "DDIMER" in the last 72 hours. Hgb A1c: Recent Labs    01/29/23 1606  HGBA1C 7.8*   Lipid Profile: No results for input(s): "CHOL", "HDL", "LDLCALC", "TRIG", "CHOLHDL", "LDLDIRECT" in  the last 72 hours. Thyroid function studies: No results for input(s): "TSH", "T4TOTAL", "T3FREE", "THYROIDAB" in the last 72 hours.  Invalid input(s): "FREET3" Anemia work up: No results for input(s): "VITAMINB12", "FOLATE", "FERRITIN", "TIBC", "IRON", "RETICCTPCT" in the last 72 hours. Sepsis Labs: Recent Labs  Lab 01/29/23 1606 01/30/23 0508  WBC 8.7 5.9   Microbiology No results found for this or any previous visit (from the past 240 hour(s)).   Medications:    cholecalciferol  2,000 Units Oral Daily   docusate sodium  100 mg Oral BID   ezetimibe  10 mg Oral Daily   gabapentin  100 mg Oral BID   insulin aspart  0-6 Units Subcutaneous TID WC   metoprolol succinate  12.5 mg Oral Daily   rosuvastatin  40 mg Oral Daily   tamsulosin  0.4 mg Oral Daily   cyanocobalamin  100 mcg Oral Daily   Continuous Infusions:  sodium chloride 50 mL/hr at 01/29/23 2217   cefTRIAXone (ROCEPHIN)  IV 1 g (01/29/23 2259)   sodium chloride irrigation        LOS: 1 day   Marinda Elk  [facility]  01/30/2023, 7:25 AM

## 2023-01-31 DIAGNOSIS — R310 Gross hematuria: Secondary | ICD-10-CM | POA: Diagnosis not present

## 2023-01-31 LAB — GLUCOSE, CAPILLARY
Glucose-Capillary: 114 mg/dL — ABNORMAL HIGH (ref 70–99)
Glucose-Capillary: 177 mg/dL — ABNORMAL HIGH (ref 70–99)
Glucose-Capillary: 165 mg/dL — ABNORMAL HIGH (ref 70–99)
Glucose-Capillary: 171 mg/dL — ABNORMAL HIGH (ref 70–99)

## 2023-01-31 NOTE — TOC Progression Note (Signed)
Transition of Care [facility]) - Progression Note    Patient Details  Name: DUAYNE HERNANDEZGARCI MRN: [id number] Date of Birth: 1923/12/25  Transition of Care [facility]) CM/SW Contact  Howell Rucks, RN Phone Number: 01/31/2023, 9:48 AM  Clinical Narrative:  PT recommendation for [facility] PT. Call to Miquel Dunn at [facility], per Florentina Addison, facility has contract with [employer] to return to ALF at [facility], facility will arrange PT. NCM will notify Florentina Addison of discharge date. TOC will continue to follow.      Expected Discharge Plan: Assisted Living Barriers to Discharge: Continued Medical Work up  Expected Discharge Plan and Services   Discharge Planning Services: CM Consult   Living arrangements for the past 2 months: Assisted Living Facility                                       Social Determinants of Health (SDOH) Interventions SDOH Screenings   Tobacco Use: Medium Risk (01/29/2023)    Readmission Risk Interventions    01/30/2023   10:20 AM  Readmission Risk Prevention Plan  Transportation Screening Complete  PCP or Specialist Appt within 5-7 Days Complete  Home Care Screening Complete  Medication Review (RN CM) Complete

## 2023-01-31 NOTE — Evaluation (Signed)
Physical Therapy Evaluation Patient Details Name: Tanner [name] MRN: [id number] DOB: 1923/10/19 Today's Date: 01/31/2023  History of Present Illness  89 y.o. male past medical history significant for bladder cancer requiring multiple TURBT's  history of of CAD, hyperlipidemia, diabetes mellitus type 2 presented to the urology's office with gross hematuria and lower abdominal pain that started 1 week prior to admission, they were not able to place a three-way Foley in the office presented to the ED start empirically on antibiotics  Clinical Impression  Pt admitted with above diagnosis. Pt ambulated 71' with RW, min A for mild unsteadiness when he let go of RW, distance limited by fatigue/dizziness. Pt's home situation and prior level of function could not be determined as he is extremely HOH (he had hearing aides on but unclear if they're working). He did state he resides at [facility], unclear which venue he's in there.  Pt currently with functional limitations due to the deficits listed below (see PT Problem List). Pt will benefit from acute skilled PT to increase their independence and safety with mobility to allow discharge.          Recommendations for follow up therapy are one component of a multi-disciplinary discharge planning process, led by the attending physician.  Recommendations may be updated based on patient status, additional functional criteria and insurance authorization.  Follow Up Recommendations       Assistance Recommended at Discharge    Patient can return home with the following  A little help with bathing/dressing/bathroom;A little help with walking and/or transfers;Assist for transportation;Assistance with cooking/housework;Help with stairs or ramp for entrance    Equipment Recommendations None recommended by PT  Recommendations for Other Services       Functional Status Assessment Patient has had a recent decline in their functional status and demonstrates the  ability to make significant improvements in function in a reasonable and predictable amount of time.     Precautions / Restrictions Precautions Precautions: Fall Precaution Comments: pt very HOH, difficult to obtain fall hx Restrictions Weight Bearing Restrictions: No      Mobility  Bed Mobility Overal bed mobility: Needs Assistance Bed Mobility: Supine to Sit     Supine to sit: Min assist     General bed mobility comments: assist to raise trunk    Transfers Overall transfer level: Needs assistance Equipment used: Rolling walker (2 wheels) Transfers: Sit to/from Stand Sit to Stand: From elevated surface, Min assist           General transfer comment: min A to steady    Ambulation/Gait Ambulation/Gait assistance: Min assist Gait Distance (Feet): 70 Feet Assistive device: Rolling walker (2 wheels) Gait Pattern/deviations: Step-through pattern, Decreased stride length, Trunk flexed Gait velocity: WFL     General Gait Details: pt became unsteady when he let go of RW with 1 hand, min A to steady, VCs to maintain hands on RW, pt reported mild dizziness at end of walk  Stairs            Wheelchair Mobility    Modified Rankin (Stroke Patients Only)       Balance Overall balance assessment: Needs assistance Sitting-balance support: Feet supported, No upper extremity supported Sitting balance-[name] Scale: Good     Standing balance support: During functional activity, Reliant on assistive device for balance, Bilateral upper extremity supported Standing balance-[name] Scale: Poor  Pertinent Vitals/Pain Pain Assessment Pain Assessment: Faces Faces Pain Scale: No hurt    Home Living                     Additional Comments: pt very HOH, unable to obtain home/PLOF info    Prior Function Prior Level of Function : Patient poor historian/Family not available             Mobility Comments: pt stated he  has a walker, unclear if he uses it       Hand Dominance        Extremity/Trunk Assessment   Upper Extremity Assessment Upper Extremity Assessment: Overall WFL for tasks assessed    Lower Extremity Assessment Lower Extremity Assessment: Overall WFL for tasks assessed    Cervical / Trunk Assessment Cervical / Trunk Assessment: Kyphotic  Communication   Communication: HOH  Cognition Arousal/Alertness: Awake/alert Behavior During Therapy: WFL for tasks assessed/performed Overall Cognitive Status: Difficult to assess                                          General Comments      Exercises     Assessment/Plan    PT Assessment Patient needs continued PT services  PT Problem List Decreased mobility;Decreased activity tolerance;Decreased balance;Decreased knowledge of use of DME       PT Treatment Interventions DME instruction;Therapeutic activities;Therapeutic exercise;Gait training;Functional mobility training;Patient/family education    PT Goals (Current goals can be found in the Care Plan section)  Acute Rehab PT Goals Patient Stated Goal: pt very HOH, not able to hear me PT Goal Formulation: Patient unable to participate in goal setting Time For Goal Achievement: 02/14/23    Frequency Min 1X/week     Co-evaluation               AM-PAC PT "6 Clicks" Mobility  Outcome Measure Help needed turning from your back to your side while in a flat bed without using bedrails?: A Little Help needed moving from lying on your back to sitting on the side of a flat bed without using bedrails?: A Little Help needed moving to and from a bed to a chair (including a wheelchair)?: A Little Help needed standing up from a chair using your arms (e.g., wheelchair or bedside chair)?: A Little Help needed to walk in hospital room?: A Little Help needed climbing 3-5 steps with a railing? : A Lot 6 Click Score: 17    End of Session Equipment Utilized During  Treatment: Gait belt Activity Tolerance: Patient tolerated treatment well Patient left: in chair;with chair alarm set;with call bell/phone within reach Nurse Communication: Mobility status PT Visit Diagnosis: Difficulty in walking, not elsewhere classified (R26.2)    Time: 9604-5409 PT Time Calculation (min) (ACUTE ONLY): 17 min   Charges:   PT Evaluation $PT Eval Moderate Complexity: 1 Mod         Tamala Ser PT 01/31/2023  Acute Rehabilitation Services  Office [phone]

## 2023-01-31 NOTE — Progress Notes (Addendum)
Urology Inpatient Progress Note  Subjective: No acute events overnight.  Was sleeping on rounds, I did not wake him.  Anti-infectives: Anti-infectives (From admission, onward)    Start     Dose/Rate Route Frequency Ordered Stop   01/29/23 2000  cefTRIAXone (ROCEPHIN) 1 g in sodium chloride 0.9 % 100 mL IVPB        1 g 200 mL/hr over 30 Minutes Intravenous Every 24 hours 01/29/23 1813         Current Facility-Administered Medications  Medication Dose Route Frequency Provider Last Rate Last Admin   0.9 %  sodium chloride infusion   Intravenous Continuous Willeen Niece, MD 50 mL/hr at 01/29/23 2217 New Bag at 01/29/23 2217   acetaminophen (TYLENOL) tablet 650 mg  650 mg Oral Q6H PRN Willeen Niece, MD   650 mg at 01/30/23 1610   Or   acetaminophen (TYLENOL) suppository 650 mg  650 mg Rectal Q6H PRN Willeen Niece, MD       cefTRIAXone (ROCEPHIN) 1 g in sodium chloride 0.9 % 100 mL IVPB  1 g Intravenous Q24H Willeen Niece, MD 200 mL/hr at 01/30/23 2129 1 g at 01/30/23 2129   cholecalciferol (VITAMIN D3) 25 MCG (1000 UNIT) tablet 2,000 Units  2,000 Units Oral Daily Willeen Niece, MD   2,000 Units at 01/30/23 0936   docusate sodium (COLACE) capsule 100 mg  100 mg Oral BID Willeen Niece, MD   100 mg at 01/30/23 2126   ezetimibe (ZETIA) tablet 10 mg  10 mg Oral Daily Willeen Niece, MD   10 mg at 01/30/23 0935   gabapentin (NEURONTIN) capsule 100 mg  100 mg Oral BID Anthoney Harada, NP   100 mg at 01/30/23 2126   insulin aspart (novoLOG) injection 0-5 Units  0-5 Units Subcutaneous QHS Marinda Elk, MD       insulin aspart (novoLOG) injection 0-9 Units  0-9 Units Subcutaneous TID WC Marinda Elk, MD   1 Units at 01/30/23 1733   insulin aspart (novoLOG) injection 3 Units  3 Units Subcutaneous TID WC Marinda Elk, MD   3 Units at 01/30/23 1733   metoprolol succinate (TOPROL-XL) 24 hr tablet 12.5 mg  12.5 mg Oral Daily Willeen Niece, MD   12.5 mg at 01/30/23  0937   ondansetron (ZOFRAN) tablet 4 mg  4 mg Oral Q6H PRN Willeen Niece, MD       Or   ondansetron (ZOFRAN) injection 4 mg  4 mg Intravenous Q6H PRN Willeen Niece, MD       rosuvastatin (CRESTOR) tablet 40 mg  40 mg Oral Daily Willeen Niece, MD   40 mg at 01/30/23 0935   sodium chloride irrigation 0.9 % 3,000 mL  3,000 mL Irrigation Continuous Clark, Meghan R, PA-C   3,000 mL at 01/30/23 9604   tamsulosin (FLOMAX) capsule 0.4 mg  0.4 mg Oral Daily Willeen Niece, MD   0.4 mg at 01/30/23 5409   vitamin B-12 (CYANOCOBALAMIN) tablet 100 mcg  100 mcg Oral Daily Willeen Niece, MD   100 mcg at 01/30/23 8119   Facility-Administered Medications Ordered in Other Encounters  Medication Dose Route Frequency Provider Last Rate Last Admin   gemcitabine (GEMZAR) chemo syringe for bladder instillation 2,000 mg  2,000 mg Bladder Instillation Once [name], MD         Objective: Vital signs in last 24 hours: Temp:  [97.6 F (36.4 C)-98.2 F (36.8 C)] 97.9 F (36.6 C) (05/31 0437) Pulse Rate:  [70-83] 70 (05/31  1610) Resp:  [18-20] 18 (05/31 0437) BP: (100-130)/(68-77) 130/74 (05/31 0437) SpO2:  [95 %-98 %] 95 % (05/31 0437)  Intake/Output from previous day: 05/30 0701 - 05/31 0700 In: 1840 [P.O.:840] Out: 96045 [Urine:12025] Intake/Output this shift: No intake/output data recorded. GENERAL APPEARANCE:  Well appearing, well developed, well nourished, NAD HEENT:  Atraumatic, normocephalic, oropharynx clear NECK:  Supple without lymphadenopathy or thyromegaly ABDOMEN:  Soft, non-tender, no masses EXTREMITIES:  Moves all extremities well, without clubbing, cyanosis, or edema NEUROLOGIC:  Alert and oriented x 3, CN II-XII grossly intact MENTAL STATUS:  appropriate BACK:  Non-tender to palpation, No CVAT SKIN:  Warm, dry, and intact GU: CBI on medium gtt., foley draining light pink urine   Lab Results:  Recent Labs    01/29/23 1606 01/30/23 0508  WBC 8.7 5.9  HGB 13.3  10.2*  HCT 41.1 31.1*  PLT 161 109*   BMET Recent Labs    01/29/23 1606 01/30/23 0508  NA 134* 136  K 4.7 3.6  CL 102 108  CO2 22 20*  GLUCOSE 211* 135*  BUN 33* 26*  CREATININE 0.98 0.88  CALCIUM 9.5 8.5*   PT/INR Recent Labs    01/29/23 1606  LABPROT 14.1  INR 1.1   ABG No results for input(s): "PHART", "HCO3" in the last 72 hours.  Invalid input(s): "PCO2", "PO2"  Studies/Results: No results found.   Assessment & Plan: #Gross hematuria with clot retention #History of bladder cancer #Chronic anticoagulation with Plavix  Hold Plavix through follow up if possible CBI off with clear yellow urine. Will D/C today. Voiding trial this am. Is successful, ok to discharge from urologic perspective.  Will discuss voiding trial with Dr. Pete Glatter.    Scherrie Bateman Norah Devin, NP 01/31/2023

## 2023-01-31 NOTE — Evaluation (Signed)
Occupational Therapy Evaluation Patient Details Name: Tanner [name] MRN: [id number] DOB: [birth year] Today's Date: 01/31/2023   History of Present Illness Mr. Carranco is a 89 y.o. male past medical history significant for bladder cancer requiring multiple TURBT's  history of of CAD, hyperlipidemia, diabetes mellitus type 2 presented to the urology's office with gross hematuria and lower abdominal pain that started 1 week prior to admission, they were not able to place a three-way Foley in the office presented to the ED start empirically on antibiotics   Clinical Impression   The pt reported being relatively independent with basic self care at the ALF, other than requiring assist for bathing from the staff. He also reported using a rollator for ambulation. He appears to be presenting slightly below his baseline level of functioning for ADL management. He will benefit from further OT services in the acute setting to maximize his safety and independence with self-care tasks, and to facilitate his safe return to the ALF.      Recommendations for follow up therapy are one component of a multi-disciplinary discharge planning process, led by the attending physician.  Recommendations may be updated based on patient status, additional functional criteria and insurance authorization.   Assistance Recommended at Discharge Intermittent Supervision/Assistance  Patient can return home with the following Direct supervision/assist for medications management;Assist for transportation;A little help with walking and/or transfers;A little help with bathing/dressing/bathroom    Functional Status Assessment  Patient has had a recent decline in their functional status and demonstrates the ability to make significant improvements in function in a reasonable and predictable amount of time.  Equipment Recommendations  None recommended by OT       Precautions / Restrictions Precautions Precautions:  Fall Restrictions Weight Bearing Restrictions: No Other Position/Activity Restrictions: hearing impaired      Mobility Bed Mobility Overal bed mobility: Needs Assistance Bed Mobility: Supine to Sit, Sit to Supine     Supine to sit: Min guard Sit to supine: Min guard        Transfers Overall transfer level: Needs assistance Equipment used: Rolling walker (2 wheels) Transfers: Sit to/from Stand Sit to Stand: From elevated surface, Min assist                  Balance Overall balance assessment: Needs assistance   Sitting balance-[name] Scale: Good         Standing balance comment: min assist                   ADL either performed or assessed with clinical judgement   ADL Overall ADL's : Needs assistance/impaired Eating/Feeding: Set up;Sitting   Grooming: Set up;Sitting Grooming Details (indicate cue type and reason): simulated         Upper Body Dressing : Set up;Sitting Upper Body Dressing Details (indicate cue type and reason): simulated seated EOB Lower Body Dressing: Minimal assistance;Sit to/from stand                       Vision Baseline Vision/History: 1 Wears glasses              Pertinent Vitals/Pain Pain Assessment Pain Assessment: No/denies pain     Hand Dominance Right   Extremity/Trunk Assessment Upper Extremity Assessment Upper Extremity Assessment: Overall WFL for tasks assessed   Lower Extremity Assessment Lower Extremity Assessment: Overall WFL for tasks assessed       Communication Communication Communication: HOH   Cognition Arousal/Alertness: Awake/alert Behavior During Therapy: [facility]  for tasks assessed/performed Overall Cognitive Status: Difficult to assess          General Comments: Oriented to person, place, and month; disoriented to year. Able to follow simple commands with occasional repetition, though the need for repetition of prompts appeared to be at least partially due to his hearing  impairment                Home Living Family/patient expects to be discharged to:: Assisted living          Home Equipment: Rollator (4 wheels)          Prior Functioning/Environment      Mobility Comments:  (He used a rollator for ambulation.) ADLs Comments: He reported being independent with dressing, feeding, and toileting. The ALF staff assisted him with bathing. He is a former Art gallery manager.        OT Problem List: Decreased strength;Impaired balance (sitting and/or standing);Decreased safety awareness;Decreased knowledge of use of DME or AE      OT Treatment/Interventions: Self-care/ADL training;Therapeutic exercise;Energy conservation;DME and/or AE instruction;Therapeutic activities;Patient/family education;Balance training    OT Goals(Current goals can be found in the care plan section) Acute Rehab OT Goals Patient Stated Goal: he did not specifically state OT Goal Formulation: With patient Time For Goal Achievement: 02/14/23 Potential to Achieve Goals: Good ADL Goals Pt Will Perform Grooming: with supervision;standing Pt Will Perform Lower Body Dressing: with supervision;sit to/from stand Pt Will Transfer to Toilet: with supervision;ambulating Pt Will Perform Toileting - Clothing Manipulation and hygiene: with supervision;sit to/from stand  OT Frequency: Min 1X/week       AM-PAC OT "6 Clicks" Daily Activity     Outcome Measure Help from another person eating meals?: None Help from another person taking care of personal grooming?: A Little Help from another person toileting, which includes using toliet, bedpan, or urinal?: A Little Help from another person bathing (including washing, rinsing, drying)?: A Little Help from another person to put on and taking off regular upper body clothing?: None Help from another person to put on and taking off regular lower body clothing?: A Little 6 Click Score: 20   End of Session Equipment Utilized During Treatment: Gait  belt Nurse Communication: Mobility status  Activity Tolerance: Patient tolerated treatment well Patient left: in bed;with call bell/phone within reach;with bed alarm set  OT Visit Diagnosis: Unsteadiness on feet (R26.81)                Time: 1610-9604 OT Time Calculation (min): 16 min Charges:  OT General Charges $OT Visit: 1 Visit OT Evaluation $OT Eval Low Complexity: 1 Low    Santiago Stenzel L Tyrion Glaude, OTR/L 01/31/2023, 4:34 PM

## 2023-01-31 NOTE — Progress Notes (Addendum)
[facility] PROGRESS NOTE    Progress Note  [name]  ZOX:[id number] DOB: 1924-07-14 DOA: 01/29/2023 PCP: [name], MD     Brief Narrative:   Tanner [name] is an 89 y.o. male past medical history significant for bladder cancer requiring multiple TURBT's  history of of CAD/TIA on Plavix since 2015, hyperlipidemia, diabetes mellitus type 2 presented to the urology's office with gross hematuria and lower abdominal pain that started 1 week prior to admission, they were not able to place a three-way Foley in the office presented to the ED start empirically on antibiotics CBI was started   Assessment/Plan:   Gross hematuria in the setting of a history of bladder cancer: Urology was consulted, CBI has been clamped Plavix was held started on IV fluids and empiric antibiotics. Urology recommended to continue CBI and hold Plavix. Continue current management. Continue Flomax. Lives with nephew.  History of bladder cancer: Treated with intravesical BCG follow-up with urology as an outpatient.  History of CAD status post CABG: Holding Plavix in setting of hematuria. Continue statin and metoprolol.  Hyperlipidemia: Continue statins.  Diabetes mellitus type 2: Holding oral hypoglycemic agents. A1c of 7.8 continue sliding scale insulin he is currently on a diet.  History of paroxysmal tachycardia and postoperative A-fib: Continue metoprolol currently in sinus rhythm.  Vitamin D deficiency: Continue supplementation.  B12 deficiency: Continue supplementation.     Stage II sacral decubitus ulcer present on admission RN Pressure Injury Documentation: Pressure Injury 01/29/23 Buttocks Right;Left Stage 2 -  Partial thickness loss of dermis presenting as a shallow open injury with a red, pink wound bed without slough. (Active)  01/29/23 2130  Location: Buttocks  Location Orientation: Right;Left  Staging: Stage 2 -  Partial thickness loss of dermis presenting as a  shallow open injury with a red, pink wound bed without slough.  Wound Description (Comments):   Present on Admission: Yes  Dressing Type Foam - Lift dressing to assess site every shift 01/30/23 2100     Pressure Injury 01/29/23 Ischial tuberosity Right Stage 1 -  Intact skin with non-blanchable redness of a localized area usually over a bony prominence. (Active)  01/29/23 2130  Location: Ischial tuberosity  Location Orientation: Right  Staging: Stage 1 -  Intact skin with non-blanchable redness of a localized area usually over a bony prominence.  Wound Description (Comments):   Present on Admission: Yes  Dressing Type Foam - Lift dressing to assess site every shift 01/30/23 2100    Estimated body mass index is 22.04 kg/m as calculated from the following:   Height as of this encounter: 5\' 9"  (1.753 m).   Weight as of this encounter: 67.7 kg.   DVT prophylaxis: SCD Family Communication:none Status is: Inpatient Remains inpatient appropriate because: Hematuria    Code Status:     Code Status Orders  (From admission, onward)           Start     Ordered   01/29/23 1811  Full code  Continuous       Question:  By:  Answer:  Consent: discussion documented in EHR   01/29/23 1812           Code Status History     Date Active Date Inactive Code Status Order ID Comments User Context   03/23/2019 1705 03/28/2019 1911 Full Code [id number]  Hughie Closs, MD Inpatient   07/12/2014 1645 07/13/2014 1426 Full Code [id number]  [name], MD Inpatient   12/09/2013 1821 12/11/2013 1452  Full Code [id number]  Leatha Gilding, MD ED      Advance Directive Documentation    Flowsheet Row Most Recent Value  Type of Advance Directive Out of facility DNR (pink MOST or yellow form)  Pre-existing out of facility DNR order (yellow form or pink MOST form) --  "MOST" Form in Place? --         IV Access:   Peripheral IV   Procedures and diagnostic studies:   No results  found.   Medical Consultants:   None.   Subjective:    Tanner [name] in a good mood this morning.  Objective:    Vitals:   01/30/23 0934 01/30/23 1315 01/30/23 2133 01/31/23 0437  BP: 105/76 100/68 106/77 130/74  Pulse: 83 79 83 70  Resp: 20 20 18 18   Temp: 97.6 F (36.4 C) 98.2 F (36.8 C) 98 F (36.7 C) 97.9 F (36.6 C)  TempSrc: Oral Oral Oral Oral  SpO2: 98% 97% 95% 95%  Weight:      Height:       SpO2: 95 %   Intake/Output Summary (Last 24 hours) at 01/31/2023 0802 Last data filed at 01/31/2023 0300 Gross per 24 hour  Intake 840 ml  Output 5600 ml  Net -4760 ml    Filed Weights   01/29/23 2109  Weight: 67.7 kg    Exam: General exam: In no acute distress. Respiratory system: Good air movement and clear to auscultation. Cardiovascular system: S1 & S2 heard, RRR. No JVD. Gastrointestinal system: Abdomen is nondistended, soft and nontender.  Extremities: No pedal edema. Skin: No rashes, lesions or ulcers Psychiatry: Judgement and insight appear normal. Mood & affect appropriate. Data Reviewed:    Labs: Basic Metabolic Panel: Recent Labs  Lab 01/29/23 1606 01/30/23 0508  NA 134* 136  K 4.7 3.6  CL 102 108  CO2 22 20*  GLUCOSE 211* 135*  BUN 33* 26*  CREATININE 0.98 0.88  CALCIUM 9.5 8.5*  MG  --  2.1  PHOS  --  3.4    GFR Estimated Creatinine Clearance: 45.9 mL/min (by C-G formula based on SCr of 0.88 mg/dL). Liver Function Tests: Recent Labs  Lab 01/29/23 1606 01/30/23 0508  AST 30 22  ALT 33 24  ALKPHOS 42 33*  BILITOT 1.3* 1.2  PROT 7.4 5.6*  ALBUMIN 3.9 2.9*    No results for input(s): "LIPASE", "AMYLASE" in the last 168 hours. No results for input(s): "AMMONIA" in the last 168 hours. Coagulation profile Recent Labs  Lab 01/29/23 1606  INR 1.1    COVID-19 Labs  No results for input(s): "DDIMER", "FERRITIN", "LDH", "CRP" in the last 72 hours.  Lab Results  Component Value Date   SARSCOV2NAA NEGATIVE  03/23/2019   SARSCOV2NAA NOT DETECTED 01/22/2019    CBC: Recent Labs  Lab 01/29/23 1606 01/30/23 0508  WBC 8.7 5.9  NEUTROABS 7.2  --   HGB 13.3 10.2*  HCT 41.1 31.1*  MCV 97.4 96.9  PLT 161 109*    Cardiac Enzymes: No results for input(s): "CKTOTAL", "CKMB", "CKMBINDEX", "TROPONINI" in the last 168 hours. BNP (last 3 results) No results for input(s): "PROBNP" in the last 8760 hours. CBG: Recent Labs  Lab 01/30/23 0738 01/30/23 1130 01/30/23 1604 01/30/23 2130 01/31/23 0743  GLUCAP 129* 288* 127* 133* 114*    D-Dimer: No results for input(s): "DDIMER" in the last 72 hours. Hgb A1c: Recent Labs    01/29/23 1606  HGBA1C 7.8*    Lipid Profile:  No results for input(s): "CHOL", "HDL", "LDLCALC", "TRIG", "CHOLHDL", "LDLDIRECT" in the last 72 hours. Thyroid function studies: No results for input(s): "TSH", "T4TOTAL", "T3FREE", "THYROIDAB" in the last 72 hours.  Invalid input(s): "FREET3" Anemia work up: No results for input(s): "VITAMINB12", "FOLATE", "FERRITIN", "TIBC", "IRON", "RETICCTPCT" in the last 72 hours. Sepsis Labs: Recent Labs  Lab 01/29/23 1606 01/30/23 0508  WBC 8.7 5.9    Microbiology No results found for this or any previous visit (from the past 240 hour(s)).   Medications:    cholecalciferol  2,000 Units Oral Daily   docusate sodium  100 mg Oral BID   ezetimibe  10 mg Oral Daily   gabapentin  100 mg Oral BID   insulin aspart  0-5 Units Subcutaneous QHS   insulin aspart  0-9 Units Subcutaneous TID WC   insulin aspart  3 Units Subcutaneous TID WC   metoprolol succinate  12.5 mg Oral Daily   rosuvastatin  40 mg Oral Daily   tamsulosin  0.4 mg Oral Daily   cyanocobalamin  100 mcg Oral Daily   Continuous Infusions:  sodium chloride 50 mL/hr at 01/29/23 2217   cefTRIAXone (ROCEPHIN)  IV 1 g (01/30/23 2129)   sodium chloride irrigation        LOS: 2 days   Marinda Elk  [facility]  01/31/2023, 8:02 AM

## 2023-02-01 DIAGNOSIS — Z7901 Long term (current) use of anticoagulants: Secondary | ICD-10-CM

## 2023-02-01 DIAGNOSIS — E1165 Type 2 diabetes mellitus with hyperglycemia: Secondary | ICD-10-CM | POA: Diagnosis not present

## 2023-02-01 DIAGNOSIS — R310 Gross hematuria: Secondary | ICD-10-CM | POA: Diagnosis not present

## 2023-02-01 LAB — GLUCOSE, CAPILLARY
Glucose-Capillary: 162 mg/dL — ABNORMAL HIGH (ref 70–99)
Glucose-Capillary: 187 mg/dL — ABNORMAL HIGH (ref 70–99)

## 2023-02-01 NOTE — Progress Notes (Signed)
Mobility Specialist - Progress Note   02/01/23 1219  Mobility  Activity Transferred from bed to chair  Level of Assistance Minimal assist, patient does 75% or more  Assistive Device Front wheel walker  Distance Ambulated (ft) 5 ft  Range of Motion/Exercises Active  Activity Response Tolerated well  $Mobility charge 1 Mobility  Mobility Specialist Start Time (ACUTE ONLY) 1128  Mobility Specialist Stop Time (ACUTE ONLY) 1145  Mobility Specialist Time Calculation (min) (ACUTE ONLY) 17 min   Pt agreeable to transfer from bed to chair to eat lunch. No A needed for bed mobility, Min A needed for standing and guiding pt to chair. No complaints made during activity. Pt left in recliner with call bell at side, lunch on tray, and NT in room.   Arliss Journey Mobility Specialist Acute Rehabilitation Services Phone: [phone] 02/01/23, 12:22 PM

## 2023-02-01 NOTE — Discharge Summary (Signed)
Physician Discharge Summary  Tanner [name] GNF:[id number] DOB: 1924/01/31 DOA: 01/29/2023  PCP: [name], MD  Admit date: 01/29/2023 Discharge date: 02/01/2023  Admitted From: Home Disposition:  Home  Recommendations for Outpatient Follow-up:  Follow up with urology in 1-2 weeks Please obtain BMP/CBC in one week The patient will go off Plavix urology to dictate when to restart.  Home Health:Yes Equipment/Devices:None  Discharge Condition:Stable CODE STATUS:Full Diet recommendation: Heart Healthy  Brief/Interim Summary: 89 y.o. male past medical history significant for bladder cancer requiring multiple TURBT's  history of of CAD/TIA on Plavix since 2015, hyperlipidemia, diabetes mellitus type 2 presented to the urology's office with gross hematuria and lower abdominal pain that started 1 week prior to admission, they were not able to place a three-way Foley in the office presented to the ED start empirically on antibiotics CBI was started   Discharge Diagnoses:  Principal Problem:   Gross hematuria Active Problems:   Dyslipidemia, goal LDL below 70   Type 2 diabetes mellitus (HCC)   Vitamin D deficiency   Glaucoma   History of bladder cancer   Chronic anticoagulation  Gross hematuria in the setting of history of bladder cancer: Plavix was held. Urology was consulted started on CBI.  Resolved. Urology recommended to continue to hold Plavix continue Flomax and follow-up with them as an outpatient.  History of CAD status post CABG in 2015: Holding Plavix in the setting of hematuria. Continue statins and metoprolol. Urology to dictate when to restart Plavix as an outpatient.  Diabetes mellitus type 2: No change made to his medication resume oral hypoglycemic agent as an outpatient.  History of paroxysmal A-fib postoperatively: In sinus rhythm continue metoprolol.  Discharge Instructions  Discharge Instructions     Diet - low sodium heart healthy   Complete by: As  directed    Increase activity slowly   Complete by: As directed    No wound care   Complete by: As directed       Allergies as of 02/01/2023   No Known Allergies      Medication List     STOP taking these medications    clopidogrel 75 MG tablet Commonly known as: PLAVIX       TAKE these medications    acetaminophen 500 MG tablet Commonly known as: TYLENOL Take 1,000 mg by mouth every 6 (six) hours as needed for mild pain.   ezetimibe 10 MG tablet Commonly known as: ZETIA Take 10 mg by mouth daily.   fish oil-omega-3 fatty acids 1000 MG capsule Take 1 g by mouth daily.   fluorouracil 5 % cream Commonly known as: EFUDEX Apply 1 Application topically 2 (two) times daily.   gabapentin 100 MG capsule Commonly known as: NEURONTIN Take 100 mg by mouth 2 (two) times daily.   Jardiance 10 MG Tabs tablet Generic drug: empagliflozin Take 10 mg by mouth daily.   latanoprost 0.005 % ophthalmic solution Commonly known as: XALATAN Place 1 drop into both eyes at bedtime.   metFORMIN 500 MG tablet Commonly known as: GLUCOPHAGE Take 500 mg by mouth daily.   metoprolol succinate 25 MG 24 hr tablet Commonly known as: TOPROL-XL Take 12.5 mg by mouth daily.   multivitamin capsule Take 1 capsule by mouth at bedtime.   Prolia 60 MG/ML Sosy injection Generic drug: denosumab Inject 60 mg into the skin every 6 (six) months.   QC TUMERIC COMPLEX PO Take 30 mLs by mouth daily.   rosuvastatin 40 MG tablet Commonly known  as: CRESTOR TAKE 1 TABLET BY MOUTH DAILY   tamsulosin 0.4 MG Caps capsule Commonly known as: FLOMAX Take 0.4 mg by mouth at bedtime.   timolol 0.5 % ophthalmic solution Commonly known as: TIMOPTIC Place 1 drop into both eyes every morning.   VITAMIN B12 PO Place 5,000 mcg under the tongue daily.   vitamin D3 50 MCG (2000 UT) Caps Take 2,000 Units by mouth daily.        Follow-up Information     Stoneking, Danford Bad., MD Follow up in 2  week(s).   Specialty: Urology Contact information: [address] [place] [address] [phone]                No Known Allergies  Consultations: Urology   Procedures/Studies: No results found. (Echo, Carotid, EGD, Colonoscopy, ERCP)    Subjective: No complaints  Discharge Exam: Vitals:   01/31/23 1957 02/01/23 0525  BP: 138/84 (!) 145/97  Pulse: 69 79  Resp: 17 20  Temp: 98.1 F (36.7 C) 97.6 F (36.4 C)  SpO2: 99% 97%   Vitals:   01/31/23 1029 01/31/23 1305 01/31/23 1957 02/01/23 0525  BP: 128/72 106/69 138/84 (!) 145/97  Pulse: 72 86 69 79  Resp:  18 17 20   Temp:  97.9 F (36.6 C) 98.1 F (36.7 C) 97.6 F (36.4 C)  TempSrc:  Oral Oral Oral  SpO2:  93% 99% 97%  Weight:      Height:        General: Pt is alert, awake, not in acute distress Cardiovascular: RRR, S1/S2 +, no rubs, no gallops Respiratory: CTA bilaterally, no wheezing, no rhonchi Abdominal: Soft, NT, ND, bowel sounds + Extremities: no edema, no cyanosis    The results of significant diagnostics from this hospitalization (including imaging, microbiology, ancillary and laboratory) are listed below for reference.     Microbiology: No results found for this or any previous visit (from the past 240 hour(s)).   Labs: BNP (last 3 results) No results for input(s): "BNP" in the last 8760 hours. Basic Metabolic Panel: Recent Labs  Lab 01/29/23 1606 01/30/23 0508  NA 134* 136  K 4.7 3.6  CL 102 108  CO2 22 20*  GLUCOSE 211* 135*  BUN 33* 26*  CREATININE 0.98 0.88  CALCIUM 9.5 8.5*  MG  --  2.1  PHOS  --  3.4   Liver Function Tests: Recent Labs  Lab 01/29/23 1606 01/30/23 0508  AST 30 22  ALT 33 24  ALKPHOS 42 33*  BILITOT 1.3* 1.2  PROT 7.4 5.6*  ALBUMIN 3.9 2.9*   No results for input(s): "LIPASE", "AMYLASE" in the last 168 hours. No results for input(s): "AMMONIA" in the last 168 hours. CBC: Recent Labs  Lab 01/29/23 1606 01/30/23 0508   WBC 8.7 5.9  NEUTROABS 7.2  --   HGB 13.3 10.2*  HCT 41.1 31.1*  MCV 97.4 96.9  PLT 161 109*   Cardiac Enzymes: No results for input(s): "CKTOTAL", "CKMB", "CKMBINDEX", "TROPONINI" in the last 168 hours. BNP: Invalid input(s): "POCBNP" CBG: Recent Labs  Lab 01/31/23 0743 01/31/23 1113 01/31/23 1626 01/31/23 2017 02/01/23 0737  GLUCAP 114* 177* 165* 171* 162*   D-Dimer No results for input(s): "DDIMER" in the last 72 hours. Hgb A1c Recent Labs    01/29/23 1606  HGBA1C 7.8*   Lipid Profile No results for input(s): "CHOL", "HDL", "LDLCALC", "TRIG", "CHOLHDL", "LDLDIRECT" in the last 72 hours. Thyroid function studies No results for input(s): "  TSH", "T4TOTAL", "T3FREE", "THYROIDAB" in the last 72 hours.  Invalid input(s): "FREET3" Anemia work up No results for input(s): "VITAMINB12", "FOLATE", "FERRITIN", "TIBC", "IRON", "RETICCTPCT" in the last 72 hours. Urinalysis    Component Value Date/Time   COLORURINE YELLOW 03/23/2019 0421   APPEARANCEUR CLOUDY (A) 03/23/2019 0421   LABSPEC 1.015 03/23/2019 0421   PHURINE 5.0 03/23/2019 0421   GLUCOSEU NEGATIVE 03/23/2019 0421   HGBUR SMALL (A) 03/23/2019 0421   BILIRUBINUR NEGATIVE 03/23/2019 0421   KETONESUR NEGATIVE 03/23/2019 0421   PROTEINUR 30 (A) 03/23/2019 0421   UROBILINOGEN 0.2 04/22/2014 1515   NITRITE POSITIVE (A) 03/23/2019 0421   LEUKOCYTESUR LARGE (A) 03/23/2019 0421   Sepsis Labs Recent Labs  Lab 01/29/23 1606 01/30/23 0508  WBC 8.7 5.9   Microbiology No results found for this or any previous visit (from the past 240 hour(s)).   SIGNED:   Marinda Elk, MD  [facility] 02/01/2023, 9:14 AM Pager   If 7PM-7AM, please contact night-coverage [email or web address] Password [email or web address]

## 2023-02-01 NOTE — TOC Transition Note (Addendum)
Transition of Care [facility]) - CM/SW Discharge Note   Patient Details  Name: Tanner [name] MRN: [id number] Date of Birth: 1923-09-24  Transition of Care [facility]) CM/SW Contact:  Adrian Prows, RN Phone Number: 02/01/2023, 9:29 AM   Clinical Narrative:    D/C orders received; pt will return to [facility]; LVM for Elsie Lincoln at facility; attempted to contact pt's nephew Philis Nettle, LVM at [phone]; contacted pt's niece Wynn Maudlin ([phone]); she will call back to let [facility] know if family will transport pt back to facility; awaiting return call.  [phone]- call back by pt's niece Wynn Maudlin; she says her brother will transport the pt back to facility; called [facility] and spoke w/Chris; he transferred this CM to nurses's station; LVM; awaiting return call from nursing station; no TOC needs.  Final next level of care: Other (comment) ([facility]) Barriers to Discharge: No Barriers Identified   Patient Goals and CMS Choice      Discharge Placement                  Patient to be transferred to facility by: family Name of family member notified: Wynn Maudlin (niece) [phone] Patient and family notified of of transfer: 02/01/23  Discharge Plan and Services Additional resources added to the After Visit Summary for     Discharge Planning Services: CM Consult                                 Social Determinants of Health (SDOH) Interventions SDOH Screenings   Tobacco Use: Medium Risk (01/29/2023)     Readmission Risk Interventions    01/30/2023   10:20 AM  Readmission Risk Prevention Plan  Transportation Screening Complete  PCP or Specialist Appt within 5-7 Days Complete  Home Care Screening Complete  Medication Review (RN CM) Complete

## 2023-02-01 NOTE — Progress Notes (Signed)
Patient ID: Tanner [name], male   DOB: November 13, 1923, 89 y.o.   MRN: [id number]    Subjective: No complaints.   Foley removed yesterday.  Objective: Vital signs in last 24 hours: Temp:  [97.6 F (36.4 C)-98.1 F (36.7 C)] 97.6 F (36.4 C) (06/01 0525) Pulse Rate:  [69-86] 79 (06/01 0525) Resp:  [17-20] 20 (06/01 0525) BP: (106-145)/(69-97) 145/97 (06/01 0525) SpO2:  [93 %-99 %] 97 % (06/01 0525)  Intake/Output from previous day: 05/31 0701 - 06/01 0700 In: 600 [P.O.:600] Out: 3475 [Urine:3475] Intake/Output this shift: No intake/output data recorded.  Physical Exam:  General: Alert and oriented GU: Condom catheter in place.  Urine is clear.  Lab Results: Recent Labs    01/29/23 1606 01/30/23 0508  HGB 13.3 10.2*  HCT 41.1 31.1*   BMET Recent Labs    01/29/23 1606 01/30/23 0508  NA 134* 136  K 4.7 3.6  CL 102 108  CO2 22 20*  GLUCOSE 211* 135*  BUN 33* 26*  CREATININE 0.98 0.88  CALCIUM 9.5 8.5*     Studies/Results: No results found.  Assessment/Plan: Hematuria: Resolved.  He should follow up with Dr. Pete Glatter at [facility] Urology office in about 2 weeks.   LOS: 3 days   Crecencio Mc 02/01/2023, 7:41 AM

## 2023-02-03 ENCOUNTER — Non-Acute Institutional Stay: Payer: Medicare Other | Admitting: Nurse Practitioner

## 2023-02-03 ENCOUNTER — Encounter: Payer: Self-pay | Admitting: Nurse Practitioner

## 2023-02-03 DIAGNOSIS — E1165 Type 2 diabetes mellitus with hyperglycemia: Secondary | ICD-10-CM | POA: Diagnosis not present

## 2023-02-03 DIAGNOSIS — I479 Paroxysmal tachycardia, unspecified: Secondary | ICD-10-CM

## 2023-02-03 DIAGNOSIS — R350 Frequency of micturition: Secondary | ICD-10-CM | POA: Diagnosis not present

## 2023-02-03 DIAGNOSIS — R310 Gross hematuria: Secondary | ICD-10-CM

## 2023-02-03 DIAGNOSIS — E538 Deficiency of other specified B group vitamins: Secondary | ICD-10-CM

## 2023-02-03 DIAGNOSIS — E559 Vitamin D deficiency, unspecified: Secondary | ICD-10-CM

## 2023-02-03 DIAGNOSIS — E785 Hyperlipidemia, unspecified: Secondary | ICD-10-CM

## 2023-02-03 DIAGNOSIS — G609 Hereditary and idiopathic neuropathy, unspecified: Secondary | ICD-10-CM

## 2023-02-03 DIAGNOSIS — H409 Unspecified glaucoma: Secondary | ICD-10-CM

## 2023-02-03 DIAGNOSIS — I2510 Atherosclerotic heart disease of native coronary artery without angina pectoris: Secondary | ICD-10-CM

## 2023-02-03 DIAGNOSIS — D500 Iron deficiency anemia secondary to blood loss (chronic): Secondary | ICD-10-CM

## 2023-02-03 NOTE — Assessment & Plan Note (Signed)
Resolved, continue to be off Plavix, f/u Urology

## 2023-02-03 NOTE — Assessment & Plan Note (Signed)
on Zetia, Fish oil, Rosuvastatin, LDL 57 01/02/23 

## 2023-02-03 NOTE — Assessment & Plan Note (Signed)
GABG x2, post Op afib, takes Plavix, Metoprolol, Jardiance, and statin 

## 2023-02-03 NOTE — Assessment & Plan Note (Signed)
wear glasses, on Timolol, Latanoprost 

## 2023-02-03 NOTE — Assessment & Plan Note (Signed)
blood loss, Hgb 10.2 01/30/23, repeat CBC/BMP next lab.

## 2023-02-03 NOTE — Progress Notes (Signed)
Location:   [facility] Room Number: 926 Place of Service:  SNF (31) Provider: Arna Snipe Kaisey Huseby NP  [name], MD  Patient Care Team: [name], MD as PCP - General (Internal Medicine) [name], MD as PCP - Cardiology (Cardiology)  Extended Emergency Contact Information Primary Emergency Contact: [name],[name] Mobile Phone: [phone] Relation: Nephew Preferred language: English Interpreter needed? No Secondary Emergency Contact: wright,[name] Mobile Phone: [phone] Relation: Niece  Code Status: DNR Goals of care: Advanced Directive information    01/29/2023    3:35 PM  Advanced Directives  Does Patient Have a Medical Advance Directive? Yes  Type of Advance Directive Out of facility DNR (pink MOST or yellow form)     Chief Complaint  Patient presents with   Acute Visit    F/u ED and urology evaluation for hematuria.     HPI:  Pt is a 89 y.o. male seen today for an acute visit for f/u Urology and hospital evaluation for hematuria, resolved, Plavix was discontinued at onset of the event @ [facility]  Hospitalized 01/29/23-02/01/23 for gross hematuria, f/u urology 1-2 weeks, repeat CBC/BMP one week, treated with Foley, antibiotics, resolved  Anemia, blood loss, Hgb 10.2 01/30/23 T2DM, Hgb A1c 8.2 01/02/23>>7.8 01/29/23 since started Meformin, Jardiance             Urinary frequency, Hx of bladder neoplasm,  2-3x nocturnal urination, no difficulty returning asleep, on Tamsulosin.              CAD, GABG x2, post Op afib, takes Plavix, Metoprolol, Jardiance, and statin             Vitamin D deficiency, on Vit D3, Vit D 51 01/02/23             Vit B12 deficiency, on Vit B12, Vit B12 877, Hgb 14.1 01/02/23             Peripheral neuropathy, on Gabapentin, ambulates with walker.  Glaucoma, wear glasses, on Timolol, Latanoprost HLD on Zetia, Fish oil, Rosuvastatin, LDL 57 01/02/23 Paroxysmal tachycardia, on Metoprolol, TSH 2.91 01/02/23                              Past Medical History:  Diagnosis Date   Anticoagulant long-term use    plavix  (for hx TIA)   Arthritis    right shoulder    Benign essential tremor    hands -- occasional   CAD S/P percutaneous coronary angioplasty cardiologist-- dr harding   a) PCI and BMS to LAD in 1991; b) CATH 10/2010: Severe 2 Vessel disease = diffuse RCA ( 75% prox, 90% mid & 70 + 75% distal) & LAD (80-90% pre-stent, 75% post-stent) with occluded D1 (filled via OM-D1 collaterals)  -- CABG x 2; c) Myoview 4/'15: INTERMEDIATE RISK - small sized reversible apical/anterolateral defect -- most likely c/w known occlusion of D1 with collaterals.(med Rx)   History of basal cell carcinoma (BCC) excision    History of bladder cancer urologist-- dr eskridge   dx 11/ 2015-- s/p TURBT   History of kidney stones    History of TIA (transient ischemic attack) 12/09/2013   History of urethral stricture    Hyperlipidemia with target LDL less than 70    controlled.   Neuropathy of lower extremity    Nocturia    Osteoporosis    Peripheral neuropathy    lower extremity   Postoperative atrial fibrillation (HCC) 10/2010  after bypass-"shocked back in and no problems since":   per cardiologist note, dr harding, no recurrence   Pre-diabetes    S/P CABG x 2 10/18/2010   LIMA-LAD, SVG to RPDA (Dr. Cornelius Moras)   TGA (transient global amnesia)    "x3 for hours then goes away"   Transient Dilated idiopathic cardiomyopathy April 2015; 04/2014   a) in setting of TIA: EF 35-40% (reduced from 55-65 percent) mild AI, moderate and dilated aorta.;; b) Recheck Echo 04/2014: EF 55-60%, Gr 1 DD, mild-mod AI.     Wears glasses    Wears partial dentures    upper   Past Surgical History:  Procedure Laterality Date   APPENDECTOMY  as teen   BASAL CELL CARCINOMA EXCISION  07/2014   CARDIAC CATHETERIZATION  10/17/2010   80%prox w/mild in-stent restenosis w/75% mid-LADstenosis aft the stent,multi severe stenoses RCAw/heavily calcified vessel,norm LV    CARDIOVERSION  2012   after bypass surgery   CATARACT EXTRACTION W/ INTRAOCULAR LENS  IMPLANT, BILATERAL  2009  approx.   COCCYX REMOVAL     CORONARY ANGIOPLASTY WITH STENT PLACEMENT  1991   PCI and BMS to LAD   CORONARY ARTERY BYPASS GRAFT  10-18-2010  [name] @[facility]    LIMA-LAD, SVG-rPDA   CYSTOSCOPY W/ RETROGRADES N/A 07/12/2014   Procedure: CYSTOSCOPY WITH RETROGRADE PYELOGRAM;  Surgeon: [name], MD;  Location: [facility];  Service: Urology;  Laterality: N/A;   CYSTOSCOPY WITH BIOPSY N/A 09/09/2014   Procedure: CYSTO WITH BIOPSY AND FULGERATION, DILATION OF URETHRAL STRICTURE;  Surgeon: [name], MD;  Location: [facility];  Service: Urology;  Laterality: N/A;  BLADDER BIOPSY   CYSTOSCOPY WITH BIOPSY N/A 01/03/2015   Procedure: CYSTOSCOPY WITH BIOPSY;  Surgeon: [name], MD;  Location: [facility];  Service: Urology;  Laterality: N/A;   CYSTOSCOPY WITH BIOPSY N/A 01/26/2019   Procedure: CYSTOSCOPY WITH BIOPSY, BILATERAL RETROGRADE PYELOGRAM;  Surgeon: [name], MD;  Location[facility];  Service: Urology;  Laterality: N/A;   CYSTOSCOPY WITH FULGERATION N/A 01/26/2019   Procedure: CYSTOSCOPY WITH FULGERATION;  Surgeon: [name], MD;  Location: [facility];  Service: Urology;  Laterality: N/A;   CYSTOSCOPY WITH URETHRAL DILATATION N/A 01/03/2015   Procedure: CYSTOSCOPY WITH URETHRAL DILATATION;  Surgeon: [name], MD;  Location: [facility];  Service: Urology;  Laterality: N/A;   DOPPLER ECHOCARDIOGRAPHY  10/18/2004   EF 55-65%,BORDERLINE -enlarged aortic root,mild aortic insuff.,trace tricus.insuff.   FINGER SURGERY Left 03-17-2001   dr [name]  @[facility]    repair complex laceration injury 5th digit   KIDNEY STONE SURGERY  1989   NM MYOVIEW LTD  12/16/2013   INTERMEDIATE RISK test with small sized reversible perfusion defect in the apical anterolateral wall -- most likely consistent with known occlusion of D1 with collaterals.    shoulder surgery Right 1988   TONSILLECTOMY  as teen   TRANSTHORACIC ECHOCARDIOGRAM  April 2015   EF 35-40%, mild AI, moderately dilated ascending aorta   TRANSURETHRAL RESECTION OF BLADDER TUMOR Right 06/11/2022   Procedure: TRANSURETHRAL RESECTION OF BLADDER TUMOR (TURBT)  >5cm;  Surgeon: [name], MD;  Location: [facility];  Service: Urology;  Laterality: Right;   TRANSURETHRAL RESECTION OF PROSTATE N/A 07/12/2014   Procedure: TRANSURETHRAL RESECTION OF THE PROSTATE (TURP) WITH MITOMYCIN -C;  Surgeon: [name], MD;  Location: [facility];  Service: Urology;  Laterality: N/A;   TRANSURETHRAL RESECTION OF PROSTATE N/A 06/11/2022   Procedure: TRANSURETHRAL RESECTION OF THE PROSTATE (TURP);  Surgeon: [name],  MD;  Location: [facility];  Service: Urology;  Laterality: N/A;  REQUESTING 2 HRS    No Known Allergies  Allergies as of 02/03/2023   No Known Allergies      Medication List        Accurate as of February 03, 2023  1:20 PM. If you have any questions, ask your nurse or doctor.          acetaminophen 500 MG tablet Commonly known as: TYLENOL Take 1,000 mg by mouth every 6 (six) hours as needed for mild pain.   ezetimibe 10 MG tablet Commonly known as: ZETIA Take 10 mg by mouth daily.   fish oil-omega-3 fatty acids 1000 MG capsule Take 1 g by mouth daily.   fluorouracil 5 % cream Commonly known as: EFUDEX Apply 1 Application topically 2 (two) times daily.   gabapentin 100 MG capsule Commonly known as: NEURONTIN Take 100 mg by mouth 2 (two) times daily.   Jardiance 10 MG Tabs tablet Generic drug: empagliflozin Take 10 mg by mouth daily.   latanoprost 0.005 % ophthalmic solution Commonly known as: XALATAN Place 1 drop into both eyes at bedtime.   metFORMIN 500 MG tablet Commonly known as: GLUCOPHAGE Take 500 mg by mouth daily.   metoprolol succinate 25 MG 24 hr tablet Commonly known as: TOPROL-XL Take 12.5 mg by mouth daily.   multivitamin  capsule Take 1 capsule by mouth at bedtime.   Prolia 60 MG/ML Sosy injection Generic drug: denosumab Inject 60 mg into the skin every 6 (six) months.   QC TUMERIC COMPLEX PO Take 30 mLs by mouth daily.   rosuvastatin 40 MG tablet Commonly known as: CRESTOR TAKE 1 TABLET BY MOUTH DAILY   tamsulosin 0.4 MG Caps capsule Commonly known as: FLOMAX Take 0.4 mg by mouth at bedtime.   timolol 0.5 % ophthalmic solution Commonly known as: TIMOPTIC Place 1 drop into both eyes every morning.   VITAMIN B12 PO Place 5,000 mcg under the tongue daily.   vitamin D3 50 MCG (2000 UT) Caps Take 2,000 Units by mouth daily.        Review of Systems  Constitutional:  Negative for appetite change, fatigue and fever.  HENT:  Positive for hearing loss. Negative for congestion and trouble swallowing.   Eyes:  Positive for visual disturbance.       Low vision  Respiratory:  Negative for cough and wheezing.   Cardiovascular:  Positive for leg swelling.  Gastrointestinal:  Negative for abdominal pain and constipation.       Suprapubic region discomfort.   Genitourinary:  Positive for frequency. Negative for difficulty urinating, dysuria and hematuria.  Musculoskeletal:  Positive for arthralgias and gait problem.  Skin:  Negative for color change.  Neurological:  Positive for numbness. Negative for weakness and headaches.       Hx of numbness in legs.   Psychiatric/Behavioral:  Negative for behavioral problems and sleep disturbance. The patient is not nervous/anxious.     Immunization History  Administered Date(s) Administered   Influenza-Unspecified 06/02/2014   Tdap 08/25/2022   Zoster Recombinat (Shingrix) 06/18/2018, 08/28/2018   Pertinent  Health Maintenance Due  Topic Date Due   FOOT EXAM  Never done   OPHTHALMOLOGY EXAM  Never done   INFLUENZA VACCINE  04/03/2023   HEMOGLOBIN A1C  08/01/2023      03/27/2019    9:21 PM 03/28/2019    8:25 AM 03/22/2022   11:09 AM 08/06/2022     9:18 PM 08/25/2022  6:51 PM  Fall Risk  (RETIRED) Patient Fall Risk Level High fall risk High fall risk Moderate fall risk Low fall risk Low fall risk   Functional Status Survey:    Vitals:   02/03/23 1249  BP: 103/61  Pulse: 62  Resp: 18  Temp: (!) 97.1 F (36.2 C)  SpO2: 98%  Weight: 154 lb 6.4 oz (70 kg)   Body mass index is 22.8 kg/m. Physical Exam Vitals and nursing note reviewed.  Constitutional:      Appearance: Normal appearance.  HENT:     Head: Normocephalic and atraumatic.     Nose: Nose normal.     Mouth/Throat:     Mouth: Mucous membranes are moist.     Comments:   Eyes:     Extraocular Movements: Extraocular movements intact.     Conjunctiva/sclera: Conjunctivae normal.     Pupils: Pupils are equal, round, and reactive to light.  Cardiovascular:     Rate and Rhythm: Normal rate and regular rhythm.     Heart sounds: No murmur heard. Pulmonary:     Effort: Pulmonary effort is normal.     Breath sounds: No rales.  Abdominal:     General: Bowel sounds are normal.     Palpations: Abdomen is soft.     Tenderness: There is no abdominal tenderness.  Musculoskeletal:     Cervical back: Normal range of motion and neck supple.     Right lower leg: Edema present.     Left lower leg: Edema present.     Comments: Trace edema in ankles.   Skin:    General: Skin is warm and dry.  Neurological:     General: No focal deficit present.     Mental Status: He is alert and oriented to person, place, and time. Mental status is at baseline.     Gait: Gait abnormal.  Psychiatric:        Mood and Affect: Mood normal.        Behavior: Behavior normal.        Thought Content: Thought content normal.        Judgment: Judgment normal.     Labs reviewed: Recent Labs    08/25/22 1848 01/29/23 1606 01/30/23 0508  NA 137 134* 136  K 4.3 4.7 3.6  CL 104 102 108  CO2 25 22 20*  GLUCOSE 171* 211* 135*  BUN 25* 33* 26*  CREATININE 0.86 0.98 0.88  CALCIUM 9.7 9.5  8.5*  MG  --   --  2.1  PHOS  --   --  3.4   Recent Labs    08/06/22 1933 01/29/23 1606 01/30/23 0508  AST 69* 30 22  ALT 66* 33 24  ALKPHOS 59 42 33*  BILITOT 1.1 1.3* 1.2  PROT 6.5 7.4 5.6*  ALBUMIN 3.6 3.9 2.9*   Recent Labs    08/25/22 1848 01/29/23 1606 01/30/23 0508  WBC 5.2 8.7 5.9  NEUTROABS  --  7.2  --   HGB 13.0 13.3 10.2*  HCT 40.8 41.1 31.1*  MCV 100.2* 97.4 96.9  PLT 161 161 109*   Lab Results  Component Value Date   TSH 0.885 03/23/2019   Lab Results  Component Value Date   HGBA1C 7.8 (H) 01/29/2023   Lab Results  Component Value Date   CHOL 131 12/10/2013   HDL 51 12/10/2013   LDLCALC 60 12/10/2013   TRIG 53 03/23/2019   CHOLHDL 2.6 12/10/2013    Significant Diagnostic Results  in last 30 days:  No results found.  Assessment/Plan: Gross hematuria Resolved, continue to be off Plavix, f/u Urology  Blood loss anemia  blood loss, Hgb 10.2 01/30/23, repeat CBC/BMP next lab.   Type 2 diabetes mellitus (HCC)  Hgb A1c 8.2 01/02/23>>7.8 01/29/23 since started Meformin, Jardiance  Urinary frequency Hx of bladder neoplasm,  2-3x nocturnal urination, no difficulty returning asleep, on Tamsulosin.   Two-vessel CAD status post CABG x2;  GABG x2, post Op afib, takes Plavix, Metoprolol, Jardiance, and statin  Vitamin D deficiency  on Vit D3, Vit D 51 01/02/23  Vitamin B12 deficiency  on Vit B12, Vit B12 877, Hgb 14.1 01/02/23  Hereditary and idiopathic peripheral neuropathy on Gabapentin, ambulates with walker.   Glaucoma  wear glasses, on Timolol, Latanoprost  Dyslipidemia, goal LDL below 70  on Zetia, Fish oil, Rosuvastatin, LDL 57 01/02/23  Paroxysmal tachycardia (HCC) on Metoprolol, TSH 2.91 01/02/23    Family/ staff Communication: plan of care reviewed with the patient and charge nurse.   Labs/tests ordered:  CBC, BMP one week  Time spend 40 minutes.

## 2023-02-03 NOTE — Assessment & Plan Note (Signed)
on Metoprolol, TSH 2.91 01/02/23

## 2023-02-03 NOTE — Assessment & Plan Note (Signed)
Hx of bladder neoplasm,  2-3x nocturnal urination, no difficulty returning asleep, on Tamsulosin.

## 2023-02-03 NOTE — Assessment & Plan Note (Signed)
on Vit B12, Vit B12 877, Hgb 14.1 01/02/23 

## 2023-02-03 NOTE — Assessment & Plan Note (Signed)
on Gabapentin, ambulates with walker.  

## 2023-02-03 NOTE — Assessment & Plan Note (Signed)
on Vit D3, Vit D 51 01/02/23 

## 2023-02-03 NOTE — Assessment & Plan Note (Signed)
Hgb A1c 8.2 01/02/23>>7.8 01/29/23 since started Meformin, Jardiance

## 2023-02-04 ENCOUNTER — Encounter: Payer: Self-pay | Admitting: Nurse Practitioner

## 2023-02-06 ENCOUNTER — Encounter: Payer: Self-pay | Admitting: Nurse Practitioner

## 2023-02-06 ENCOUNTER — Non-Acute Institutional Stay (INDEPENDENT_AMBULATORY_CARE_PROVIDER_SITE_OTHER): Payer: Medicare Other | Admitting: Nurse Practitioner

## 2023-02-06 DIAGNOSIS — Z0000 Encounter for general adult medical examination without abnormal findings: Secondary | ICD-10-CM | POA: Diagnosis not present

## 2023-02-06 NOTE — Progress Notes (Signed)
This encounter was created in error - please disregard.

## 2023-02-06 NOTE — Progress Notes (Signed)
Subjective:   Tanner [name] is a 89 y.o. male who presents for Medicare Annual/Subsequent preventive examination at [facility].        Objective:    Today's Vitals   02/06/23 1410  BP: 123/70  Pulse: 81  Resp: 18  Temp: (!) 97.2 F (36.2 C)  Weight: 149 lb 6.4 oz (67.8 kg)  Height: 5\' 9"  (1.753 m)  PainSc: 0-No pain   Body mass index is 22.06 kg/m.     02/04/2023    4:09 PM 01/29/2023    3:35 PM 12/30/2022   10:13 AM 08/25/2022    6:50 PM 08/06/2022    9:18 PM 06/11/2022    8:38 AM 06/05/2022   11:51 AM  Advanced Directives  Does Patient Have a Medical Advance Directive? Yes Yes Yes No No Yes Yes  Type of Advance Directive Out of facility DNR (pink MOST or yellow form) Out of facility DNR (pink MOST or yellow form) Out of facility DNR (pink MOST or yellow form)   Healthcare Power of Attorney   Does patient want to make changes to medical advance directive? No - Patient declined     No - Patient declined   Copy of Healthcare Power of Attorney in Chart?      No - copy requested   Would patient like information on creating a medical advance directive?   No - Patient declined  No - Patient declined      Current Medications (verified) Outpatient Encounter Medications as of 02/06/2023  Medication Sig   acetaminophen (TYLENOL) 500 MG tablet Take 1,000 mg by mouth every 6 (six) hours as needed for mild pain.   Cholecalciferol (VITAMIN D3) 50 MCG (2000 UT) CAPS Take 2,000 Units by mouth daily.   Cyanocobalamin (VITAMIN B12 PO) Place 5,000 mcg under the tongue daily.   denosumab (PROLIA) 60 MG/ML SOSY injection Inject 60 mg into the skin every 6 (six) months.   ezetimibe (ZETIA) 10 MG tablet Take 10 mg by mouth daily.   fish oil-omega-3 fatty acids 1000 MG capsule Take 1 g by mouth daily.    fluorouracil (EFUDEX) 5 % cream Apply 1 Application topically 2 (two) times daily.   gabapentin (NEURONTIN) 100 MG capsule Take 100 mg by mouth 2 (two) times daily.   JARDIANCE  10 MG TABS tablet Take 10 mg by mouth daily.   latanoprost (XALATAN) 0.005 % ophthalmic solution Place 1 drop into both eyes at bedtime.   metFORMIN (GLUCOPHAGE) 500 MG tablet Take 500 mg by mouth daily.   metoprolol succinate (TOPROL-XL) 25 MG 24 hr tablet Take 12.5 mg by mouth daily.   Multiple Vitamin (MULTIVITAMIN) capsule Take 1 capsule by mouth at bedtime.    rosuvastatin (CRESTOR) 40 MG tablet TAKE 1 TABLET BY MOUTH DAILY (Patient taking differently: Take 40 mg by mouth daily.)   tamsulosin (FLOMAX) 0.4 MG CAPS capsule Take 0.4 mg by mouth at bedtime.   timolol (TIMOPTIC) 0.5 % ophthalmic solution Place 1 drop into both eyes every morning.   Turmeric (QC TUMERIC COMPLEX PO) Take 30 mLs by mouth daily.   Facility-Administered Encounter Medications as of 02/06/2023  Medication   gemcitabine (GEMZAR) chemo syringe for bladder instillation 2,000 mg    Allergies (verified) Patient has no known allergies.   History: Past Medical History:  Diagnosis Date   Anticoagulant long-term use    plavix  (for hx TIA)   Arthritis    right shoulder    Benign essential tremor  hands -- occasional   CAD S/P percutaneous coronary angioplasty cardiologist-- dr harding   a) PCI and BMS to LAD in 1991; b) CATH 10/2010: Severe 2 Vessel disease = diffuse RCA ( 75% prox, 90% mid & 70 + 75% distal) & LAD (80-90% pre-stent, 75% post-stent) with occluded D1 (filled via OM-D1 collaterals)  -- CABG x 2; c) Myoview 4/'15: INTERMEDIATE RISK - small sized reversible apical/anterolateral defect -- most likely c/w known occlusion of D1 with collaterals.(med Rx)   History of basal cell carcinoma (BCC) excision    History of bladder cancer urologist-- dr eskridge   dx 11/ 2015-- s/p TURBT   History of kidney stones    History of TIA (transient ischemic attack) 12/09/2013   History of urethral stricture    Hyperlipidemia with target LDL less than 70    controlled.   Neuropathy of lower extremity    Nocturia     Osteoporosis    Peripheral neuropathy    lower extremity   Postoperative atrial fibrillation (HCC) 10/2010   after bypass-"shocked back in and no problems since":   per cardiologist note, [name], no recurrence   Pre-diabetes    S/P CABG x 2 10/18/2010   LIMA-LAD, SVG to RPDA (Dr. Cornelius Moras)   TGA (transient global amnesia)    "x3 for hours then goes away"   Transient Dilated idiopathic cardiomyopathy April 2015; 04/2014   a) in setting of TIA: EF 35-40% (reduced from 55-65 percent) mild AI, moderate and dilated aorta.;; b) Recheck Echo 04/2014: EF 55-60%, Gr 1 DD, mild-mod AI.     Wears glasses    Wears partial dentures    upper   Past Surgical History:  Procedure Laterality Date   APPENDECTOMY  as teen   BASAL CELL CARCINOMA EXCISION  07/2014   CARDIAC CATHETERIZATION  10/17/2010   80%prox w/mild in-stent restenosis w/75% mid-LADstenosis aft the stent,multi severe stenoses RCAw/heavily calcified vessel,norm LV   CARDIOVERSION  2012   after bypass surgery   CATARACT EXTRACTION W/ INTRAOCULAR LENS  IMPLANT, BILATERAL  2009  approx.   COCCYX REMOVAL     CORONARY ANGIOPLASTY WITH STENT PLACEMENT  1991   PCI and BMS to LAD   CORONARY ARTERY BYPASS GRAFT  10-18-2010  [name] @[facility]    LIMA-LAD, SVG-rPDA   CYSTOSCOPY W/ RETROGRADES N/A 07/12/2014   Procedure: CYSTOSCOPY WITH RETROGRADE PYELOGRAM;  Surgeon: [name], MD;  Location: [facility];  Service: Urology;  Laterality: N/A;   CYSTOSCOPY WITH BIOPSY N/A 09/09/2014   Procedure: CYSTO WITH BIOPSY AND FULGERATION, DILATION OF URETHRAL STRICTURE;  Surgeon: [name], MD;  Location: [facility];  Service: Urology;  Laterality: N/A;  BLADDER BIOPSY   CYSTOSCOPY WITH BIOPSY N/A 01/03/2015   Procedure: CYSTOSCOPY WITH BIOPSY;  Surgeon: [name], MD;  Location: [facility];  Service: Urology;  Laterality: N/A;   CYSTOSCOPY WITH BIOPSY N/A 01/26/2019   Procedure: CYSTOSCOPY WITH BIOPSY, BILATERAL RETROGRADE PYELOGRAM;  Surgeon: [name], MD;  Location: [facility];  Service: Urology;  Laterality: N/A;   CYSTOSCOPY WITH FULGERATION N/A 01/26/2019   Procedure: CYSTOSCOPY WITH FULGERATION;  Surgeon: [name], MD;  Location: [facility];  Service: Urology;  Laterality: N/A;   CYSTOSCOPY WITH URETHRAL DILATATION N/A 01/03/2015   Procedure: CYSTOSCOPY WITH URETHRAL DILATATION;  Surgeon: [name], MD;  Location: [facility];  Service: Urology;  Laterality: N/A;   DOPPLER ECHOCARDIOGRAPHY  10/18/2004   EF 55-65%,BORDERLINE -enlarged aortic root,mild aortic insuff.,trace tricus.insuff.   FINGER  SURGERY Left 03-17-2001   dr [name]  @[facility]    repair complex laceration injury 5th digit   KIDNEY STONE SURGERY  1989   NM MYOVIEW LTD  12/16/2013   INTERMEDIATE RISK test with small sized reversible perfusion defect in the apical anterolateral wall -- most likely consistent with known occlusion of D1 with collaterals.   shoulder surgery Right 1988   TONSILLECTOMY  as teen   TRANSTHORACIC ECHOCARDIOGRAM  April 2015   EF 35-40%, mild AI, moderately dilated ascending aorta   TRANSURETHRAL RESECTION OF BLADDER TUMOR Right 06/11/2022   Procedure: TRANSURETHRAL RESECTION OF BLADDER TUMOR (TURBT)  >5cm;  Surgeon: [name], MD;  Location: [facility];  Service: Urology;  Laterality: Right;   TRANSURETHRAL RESECTION OF PROSTATE N/A 07/12/2014   Procedure: TRANSURETHRAL RESECTION OF THE PROSTATE (TURP) WITH MITOMYCIN -C;  Surgeon: [name], MD;  Location: [facility];  Service: Urology;  Laterality: N/A;   TRANSURETHRAL RESECTION OF PROSTATE N/A 06/11/2022   Procedure: TRANSURETHRAL RESECTION OF THE PROSTATE (TURP);  Surgeon: [name], MD;  Location: [facility];  Service: Urology;  Laterality: N/A;  REQUESTING 2 HRS   Family History  Problem Relation Age of Onset   Heart Problems Mother    Parkinson's disease Father    Heart Problems Father    Social History   Socioeconomic History    Marital status: Married    Spouse name: Mary   Number of children: 0   Years of education: BS, MS   Highest education level: Not on file  Occupational History   Occupation: Retired  Tobacco Use   Smoking status: Former    Packs/day: 1.00    Years: 5.00    Additional pack years: 0.00    Total pack years: 5.00    Types: Cigarettes    Quit date: 09/02/1957    Years since quitting: 65.4   Smokeless tobacco: Never  Vaping Use   Vaping Use: Never used  Substance and Sexual Activity   Alcohol use: Never    Alcohol/week: 0.0 standard drinks of alcohol   Drug use: Never   Sexual activity: Not Currently  Other Topics Concern   Not on file  Social History Narrative   Pt lives at home with spouse.   Caffeine Use: very little      He stopped doing the [employer] building about 6 months ago, indicating that he just has not had the same out of energy.  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  He says that now he can only do about 2 and half hours worth of work before you stop and rest.   Social Determinants of Health   Financial Resource Strain: Not on file  Food Insecurity: Not on file  Transportation Needs: Not on file  Physical Activity: Not on file  Stress: Not on file  Social Connections: Not on file    Tobacco Counseling Counseling given: Not Answered   Clinical Intake:     Pain : No/denies pain Pain Score: 0-No pain Faces Pain Scale: No hurt  Faces Pain Scale: No hurt  BMI - recorded: 22.8 Nutritional Status: BMI of 19-24  Normal Nutritional Risks: None  How often do you need to have someone help you when you read instructions, pamphlets, or other written materials from your doctor or pharmacy?: 4 - Often What is the last grade level you completed in school?: college  Diabetic?yes  Interpreter Needed?: No  Information entered by :: Juli Odom Link Snuffer  Cherly Erno NP   Activities of Daily Living    01/29/2023    9:40 PM 06/05/2022    11:49 AM  In your present state of health, do you have any difficulty performing the following activities:  Hearing? 1 1  Comment  hearing aids in both ears  Vision? 0 1  Difficulty concentrating or making decisions? 1 0  Comment  patient is above average for a patient 89 yo  Walking or climbing stairs? 1 1  Dressing or bathing? 1 0  Doing errands, shopping? 0     Patient Care Team: [name], MD as PCP - General (Internal Medicine) [name], MD as PCP - Cardiology (Cardiology)  Indicate any recent Medical Services you may have received from other than [facility] providers in the past year (date may be approximate).     Assessment:   This is a routine wellness examination for [place].  Hearing/Vision screen No results found.  Dietary issues and exercise activities discussed:     Goals Addressed             This Visit's Progress    Maintain Mobility and Function       Evidence-based guidance:  Acknowledge and validate impact of pain, loss of strength and potential disfigurement (hand osteoarthritis) on mental health and daily life, such as social isolation, anxiety, depression, impaired sexual relationship and   injury from falls.  Anticipate referral to physical or occupational therapy for assessment, therapeutic exercise and recommendation for adaptive equipment or assistive devices; encourage participation.  Assess impact on ability to perform activities of daily living, as well as engage in sports and leisure events or requirements of work or school.  Provide anticipatory guidance and reassurance about the benefit of exercise to maintain function; acknowledge and normalize fear that exercise may worsen symptoms.  Encourage regular exercise, at least 10 minutes at a time for 45 minutes per week; consider yoga, water exercise and proprioceptive exercises; encourage use of wearable activity tracker to increase motivation and adherence.  Encourage maintenance or  resumption of daily activities, including employment, as pain allows and with minimal exposure to trauma.  Assist patient to advocate for adaptations to the work environment.  Consider level of pain and function, gender, age, lifestyle, patient preference, quality of life, readiness and ?ocapacity to benefit? when recommending patients for orthopaedic surgery consultation.  Explore strategies, such as changes to medication regimen or activity that enables patient to anticipate and manage flare-ups that increase deconditioning and disability.  Explore patient preferences; encourage exposure to a broader range of activities that have been avoided for fear of experiencing pain.  Identify barriers to participation in therapy or exercise, such as pain with activity, anticipated or imagined pain.  Monitor postoperative joint replacement or any preexisting joint replacement for ongoing pain and loss of function; provide social support and encouragement throughout recovery.   Notes:        Depression Screen     No data to display          Fall Risk    02/04/2023    4:10 PM 05/08/2016    8:22 AM 10/16/2015    1:11 PM  Fall Risk   Falls in the past year? 1 No No  Number falls in past yr: 1    Injury with Fall? 1    Risk for fall due to : History of fall(s);Impaired balance/gait;Impaired mobility    Follow up Falls evaluation completed      FALL RISK PREVENTION  PERTAINING TO THE HOME:  Any stairs in or around the home? Yes  If so, are there any without handrails? No  Home free of loose throw rugs in walkways, pet beds, electrical cords, etc? Yes  Adequate lighting in your home to reduce risk of falls? Yes   ASSISTIVE DEVICES UTILIZED TO PREVENT FALLS:  Life alert? No  Use of a cane, walker or w/c? Yes  Grab bars in the bathroom? Yes  Shower chair or bench in shower? Yes  Elevated toilet seat or a handicapped toilet? Yes   TIMED UP AND GO:  Was the test performed? Yes .  Length  of time to ambulate 10 feet: 8 sec.   Gait slow and steady with assistive device  Cognitive Function:    02/06/2023    2:13 PM  MMSE - Mini Mental State Exam  Copy design-comments MMSE 25/30 01/02/23        Immunizations Immunization History  Administered Date(s) Administered   Influenza-Unspecified 06/02/2014   Tdap 08/25/2022   Zoster Recombinat (Shingrix) 06/18/2018, 08/28/2018    TDAP status: Up to date  Flu Vaccine status: Up to date  Pneumococcal vaccine status: Due, Education has been provided regarding the importance of this vaccine. Advised may receive this vaccine at local pharmacy or Health Dept. Aware to provide a copy of the vaccination record if obtained from local pharmacy or Health Dept. Verbalized acceptance and understanding.  Covid-19 vaccine status: Completed vaccines  Qualifies for Shingles Vaccine? Yes   Zostavax completed Yes   Shingrix Completed?: Yes  Screening Tests Health Maintenance  Topic Date Due   COVID-19 Vaccine (1) Never done   OPHTHALMOLOGY EXAM  Never done   Pneumonia Vaccine 53+ Years old (1 of 1 - PCV) Never done   INFLUENZA VACCINE  04/03/2023   HEMOGLOBIN A1C  08/01/2023   FOOT EXAM  02/06/2024   Medicare Annual Wellness (AWV)  02/06/2024   DTaP/Tdap/Td (2 - Td or Tdap) 08/25/2032   Zoster Vaccines- Shingrix  Completed   HPV VACCINES  Aged Out    Health Maintenance  Health Maintenance Due  Topic Date Due   COVID-19 Vaccine (1) Never done   OPHTHALMOLOGY EXAM  Never done   Pneumonia Vaccine 8+ Years old (1 of 1 - PCV) Never done    Colorectal cancer screening: No longer required.   Lung Cancer Screening: (Low Dose CT Chest recommended if Age 27-80 years, 30 pack-year currently smoking OR have quit w/in 15years.) does not qualify.     Additional Screening:  Hepatitis C Screening: does not qualify  Vision Screening: Recommended annual ophthalmology exams for early detection of glaucoma and other disorders of the  eye. Is the patient up to date with their annual eye exam?  No  Who is the provider or what is the name of the office in which the patient attends annual eye exams? [facility] will provide If pt is not established with a provider, would they like to be referred to a provider to establish care? No .   Dental Screening: Recommended annual dental exams for proper oral hygiene  Community Resource Referral / Chronic Care Management: CRR required this visit?  No   CCM required this visit?  No      Plan:    PNA 20 ordered.  I have personally reviewed and noted the following in the patient's chart:   Medical and social history Use of alcohol, tobacco or illicit drugs  Current medications and supplements including opioid prescriptions. Patient is  not currently taking opioid prescriptions. Functional ability and status Nutritional status Physical activity Advanced directives List of other physicians Hospitalizations, surgeries, and ER visits in previous 12 months Vitals Screenings to include cognitive, depression, and falls Referrals and appointments  In addition, I have reviewed and discussed with patient certain preventive protocols, quality metrics, and best practice recommendations. A written personalized care plan for preventive services as well as general preventive health recommendations were provided to patient.     Lynnda Wiersma X Nolan Lasser, NP   02/07/2023

## 2023-02-07 ENCOUNTER — Encounter: Payer: Self-pay | Admitting: Nurse Practitioner

## 2023-02-11 LAB — BASIC METABOLIC PANEL
BUN: 18 (ref 4–21)
CO2: 25 — AB (ref 13–22)
Chloride: 107 (ref 99–108)
Creatinine: 1 (ref 0.6–1.3)
Glucose: 127
Potassium: 4.6 mEq/L (ref 3.5–5.1)
Sodium: 139 (ref 137–147)

## 2023-02-11 LAB — CBC AND DIFFERENTIAL
HCT: 32 — AB (ref 41–53)
Hemoglobin: 10.6 — AB (ref 13.5–17.5)
Platelets: 192 10*3/uL (ref 150–400)
WBC: 3.8

## 2023-02-11 LAB — CBC: RBC: 3.28 — AB (ref 3.87–5.11)

## 2023-02-11 LAB — COMPREHENSIVE METABOLIC PANEL
Calcium: 8.8 (ref 8.7–10.7)
eGFR: 71

## 2023-02-12 ENCOUNTER — Non-Acute Institutional Stay: Payer: Medicare Other | Admitting: Nurse Practitioner

## 2023-02-12 ENCOUNTER — Encounter: Payer: Self-pay | Admitting: Nurse Practitioner

## 2023-02-12 DIAGNOSIS — I479 Paroxysmal tachycardia, unspecified: Secondary | ICD-10-CM | POA: Diagnosis not present

## 2023-02-12 DIAGNOSIS — I959 Hypotension, unspecified: Secondary | ICD-10-CM | POA: Diagnosis not present

## 2023-02-12 DIAGNOSIS — M150 Primary generalized (osteo)arthritis: Secondary | ICD-10-CM

## 2023-02-12 DIAGNOSIS — E1165 Type 2 diabetes mellitus with hyperglycemia: Secondary | ICD-10-CM

## 2023-02-12 DIAGNOSIS — R310 Gross hematuria: Secondary | ICD-10-CM

## 2023-02-12 DIAGNOSIS — D500 Iron deficiency anemia secondary to blood loss (chronic): Secondary | ICD-10-CM

## 2023-02-12 DIAGNOSIS — E559 Vitamin D deficiency, unspecified: Secondary | ICD-10-CM

## 2023-02-12 DIAGNOSIS — I2510 Atherosclerotic heart disease of native coronary artery without angina pectoris: Secondary | ICD-10-CM

## 2023-02-12 DIAGNOSIS — R350 Frequency of micturition: Secondary | ICD-10-CM

## 2023-02-12 DIAGNOSIS — E785 Hyperlipidemia, unspecified: Secondary | ICD-10-CM

## 2023-02-12 DIAGNOSIS — M159 Polyosteoarthritis, unspecified: Secondary | ICD-10-CM | POA: Diagnosis not present

## 2023-02-12 DIAGNOSIS — E538 Deficiency of other specified B group vitamins: Secondary | ICD-10-CM

## 2023-02-12 DIAGNOSIS — G609 Hereditary and idiopathic neuropathy, unspecified: Secondary | ICD-10-CM

## 2023-02-12 NOTE — Assessment & Plan Note (Signed)
on Zetia, Fish oil, Rosuvastatin, LDL 57 01/02/23 

## 2023-02-12 NOTE — Assessment & Plan Note (Signed)
Hx of bladder neoplasm,  2-3x nocturnal urination, no difficulty returning asleep, on Tamsulosin.  

## 2023-02-12 NOTE — Assessment & Plan Note (Signed)
GABG x2, post Op afib, takes Plavix, Metoprolol, Jardiance, and statin 

## 2023-02-12 NOTE — Assessment & Plan Note (Signed)
Bp measurements low, dc Metoprolol 12.5mg  qd. Obtain Ortho Bp x3 sets.

## 2023-02-12 NOTE — Assessment & Plan Note (Signed)
Hgb A1c 8.2 01/02/23>>7.8 01/29/23 since started Meformin, Jardiance, Bun/creat 18/0.97 02/11/23

## 2023-02-12 NOTE — Assessment & Plan Note (Addendum)
c/o left arm pain with movement, no decreased ROM, denied injury, no apparent bruise, deformity, or redness. The patient stated Tylenol is not effective, will try Norco 5/325mg  1/2 po daily for now, apply Voltaren gel to the deltoid muscle under the acromial process. Observe.

## 2023-02-12 NOTE — Assessment & Plan Note (Signed)
on Gabapentin, ambulates with walker.  

## 2023-02-12 NOTE — Assessment & Plan Note (Signed)
02/12/23 off Metoprolol 2/2 low Bp,  TSH 2.91 01/02/23

## 2023-02-12 NOTE — Assessment & Plan Note (Signed)
blood loss, Hgb 10.4 02/11/23

## 2023-02-12 NOTE — Assessment & Plan Note (Signed)
f/u Urology and hospital evaluation for hematuria, resolved, Plavix was resumed by Urology.

## 2023-02-12 NOTE — Progress Notes (Addendum)
Location:   [facility] Room Number: 926 Place of Service:  ALF (13) Provider: Arna Snipe Betul Brisky NP  [name], MD  Patient Care Team: [name], MD as PCP - General (Internal Medicine) [name], MD as PCP - Cardiology (Cardiology)  Extended Emergency Contact Information Primary Emergency Contact: [name],[name] Mobile Phone: [phone] Relation: Nephew Preferred language: English Interpreter needed? No Secondary Emergency Contact: wright,[name] Mobile Phone: [phone] Relation: Niece  Code Status: DNR Goals of care: Advanced Directive information    02/04/2023    4:09 PM  Advanced Directives  Does Patient Have a Medical Advance Directive? Yes  Type of Advance Directive Out of facility DNR (pink MOST or yellow form)  Does patient want to make changes to medical advance directive? No - Patient declined     Chief Complaint  Patient presents with   Acute Visit    Left arm pain, low blood pressure.     HPI:  Pt is a 89 y.o. male seen today for an acute visit for c/o left arm pain with movement, no decreased ROM, denied injury, no apparent bruise, deformity, or redness.   Bp measurements low, only takes Metoprolol 12.5mg  qd.  f/u Urology and hospital evaluation for hematuria, resolved, Plavix was resumed by Urology 02/12/23             Hospitalized 01/29/23-02/01/23 for gross hematuria, f/u urology              Anemia, blood loss, Hgb 10.4 02/11/23 T2DM, Hgb A1c 8.2 01/02/23>>7.8 01/29/23 since started Meformin, Jardiance, Bun/creat 18/0.97 02/11/23             Urinary frequency, Hx of bladder neoplasm,  2-3x nocturnal urination, no difficulty returning asleep, on Tamsulosin.              CAD, GABG x2, post Op afib, takes Plavix, Metoprolol, Jardiance, and statin             Vitamin D deficiency, on Vit D3, Vit D 51 01/02/23             Vit B12 deficiency, on Vit B12, Vit B12 877, Hgb 10.6 02/11/23             Peripheral neuropathy, on Gabapentin, ambulates with  walker.  Glaucoma, wear glasses, on Timolol, Latanoprost HLD on Zetia, Fish oil, Rosuvastatin, LDL 57 01/02/23 Paroxysmal tachycardia, 02/12/23 off Metoprolol 2/2 low Bp,  TSH 2.91 01/02/23   Past Medical History:  Diagnosis Date   Anticoagulant long-term use    plavix  (for hx TIA)   Arthritis    right shoulder    Benign essential tremor    hands -- occasional   CAD S/P percutaneous coronary angioplasty cardiologist-- dr harding   a) PCI and BMS to LAD in 1991; b) CATH 10/2010: Severe 2 Vessel disease = diffuse RCA ( 75% prox, 90% mid & 70 + 75% distal) & LAD (80-90% pre-stent, 75% post-stent) with occluded D1 (filled via OM-D1 collaterals)  -- CABG x 2; c) Myoview 4/'15: INTERMEDIATE RISK - small sized reversible apical/anterolateral defect -- most likely c/w known occlusion of D1 with collaterals.(med Rx)   History of basal cell carcinoma (BCC) excision    History of bladder cancer urologist-- dr eskridge   dx 11/ 2015-- s/p TURBT   History of kidney stones    History of TIA (transient ischemic attack) 12/09/2013   History of urethral stricture    Hyperlipidemia with target LDL less than 70    controlled.  Neuropathy of lower extremity    Nocturia    Osteoporosis    Peripheral neuropathy    lower extremity   Postoperative atrial fibrillation (HCC) 10/2010   after bypass-"shocked back in and no problems since":   per cardiologist note, [name], no recurrence   Pre-diabetes    S/P CABG x 2 10/18/2010   LIMA-LAD, SVG to RPDA (Dr. Cornelius Moras)   TGA (transient global amnesia)    "x3 for hours then goes away"   Transient Dilated idiopathic cardiomyopathy April 2015; 04/2014   a) in setting of TIA: EF 35-40% (reduced from 55-65 percent) mild AI, moderate and dilated aorta.;; b) Recheck Echo 04/2014: EF 55-60%, Gr 1 DD, mild-mod AI.     Wears glasses    Wears partial dentures    upper   Past Surgical History:  Procedure Laterality Date   APPENDECTOMY  as teen   BASAL CELL CARCINOMA  EXCISION  07/2014   CARDIAC CATHETERIZATION  10/17/2010   80%prox w/mild in-stent restenosis w/75% mid-LADstenosis aft the stent,multi severe stenoses RCAw/heavily calcified vessel,norm LV   CARDIOVERSION  2012   after bypass surgery   CATARACT EXTRACTION W/ INTRAOCULAR LENS  IMPLANT, BILATERAL  2009  approx.   COCCYX REMOVAL     CORONARY ANGIOPLASTY WITH STENT PLACEMENT  1991   PCI and BMS to LAD   CORONARY ARTERY BYPASS GRAFT  10-18-2010  [name] @[facility]    LIMA-LAD, SVG-rPDA   CYSTOSCOPY W/ RETROGRADES N/A 07/12/2014   Procedure: CYSTOSCOPY WITH RETROGRADE PYELOGRAM;  Surgeon: [name], MD;  Location: [facility];  Service: Urology;  Laterality: N/A;   CYSTOSCOPY WITH BIOPSY N/A 09/09/2014   Procedure: CYSTO WITH BIOPSY AND FULGERATION, DILATION OF URETHRAL STRICTURE;  Surgeon: [name], MD;  Location: [facility];  Service: Urology;  Laterality: N/A;  BLADDER BIOPSY   CYSTOSCOPY WITH BIOPSY N/A 01/03/2015   Procedure: CYSTOSCOPY WITH BIOPSY;  Surgeon: [name], MD;  Location: [facility];  Service: Urology;  Laterality: N/A;   CYSTOSCOPY WITH BIOPSY N/A 01/26/2019   Procedure: CYSTOSCOPY WITH BIOPSY, BILATERAL RETROGRADE PYELOGRAM;  Surgeon: [name], MD;  Location: [facility];  Service: Urology;  Laterality: N/A;   CYSTOSCOPY WITH FULGERATION N/A 01/26/2019   Procedure: CYSTOSCOPY WITH FULGERATION;  Surgeon: [name], MD;  Location: [facility];  Service: Urology;  Laterality: N/A;   CYSTOSCOPY WITH URETHRAL DILATATION N/A 01/03/2015   Procedure: CYSTOSCOPY WITH URETHRAL DILATATION;  Surgeon: [name], MD;  Location: [facility];  Service: Urology;  Laterality: N/A;   DOPPLER ECHOCARDIOGRAPHY  10/18/2004   EF 55-65%,BORDERLINE -enlarged aortic root,mild aortic insuff.,trace tricus.insuff.   FINGER SURGERY Left 03-17-2001   dr [name]  @[facility]    repair complex laceration injury 5th digit   KIDNEY STONE SURGERY  1989   NM MYOVIEW LTD   12/16/2013   INTERMEDIATE RISK test with small sized reversible perfusion defect in the apical anterolateral wall -- most likely consistent with known occlusion of D1 with collaterals.   shoulder surgery Right 1988   TONSILLECTOMY  as teen   TRANSTHORACIC ECHOCARDIOGRAM  April 2015   EF 35-40%, mild AI, moderately dilated ascending aorta   TRANSURETHRAL RESECTION OF BLADDER TUMOR Right 06/11/2022   Procedure: TRANSURETHRAL RESECTION OF BLADDER TUMOR (TURBT)  >5cm;  Surgeon: [name], MD;  Location: [facility];  Service: Urology;  Laterality: Right;   TRANSURETHRAL RESECTION OF PROSTATE N/A 07/12/2014   Procedure: TRANSURETHRAL RESECTION OF THE PROSTATE (TURP) WITH MITOMYCIN -C;  Surgeon: [name], MD;  Location: [facility];  Service: Urology;  Laterality: N/A;   TRANSURETHRAL RESECTION OF PROSTATE N/A 06/11/2022   Procedure: TRANSURETHRAL RESECTION OF THE PROSTATE (TURP);  Surgeon: [name], MD;  Location: [facility];  Service: Urology;  Laterality: N/A;  REQUESTING 2 HRS    No Known Allergies  Allergies as of 02/12/2023   No Known Allergies      Medication List        Accurate as of February 12, 2023 11:59 PM. If you have any questions, ask your nurse or doctor.          acetaminophen 500 MG tablet Commonly known as: TYLENOL Take 1,000 mg by mouth every 6 (six) hours as needed for mild pain.   ezetimibe 10 MG tablet Commonly known as: ZETIA Take 10 mg by mouth daily.   fish oil-omega-3 fatty acids 1000 MG capsule Take 1 g by mouth daily.   fluorouracil 5 % cream Commonly known as: EFUDEX Apply 1 Application topically 2 (two) times daily.   gabapentin 100 MG capsule Commonly known as: NEURONTIN Take 100 mg by mouth 2 (two) times daily.   Jardiance 10 MG Tabs tablet Generic drug: empagliflozin Take 10 mg by mouth daily.   latanoprost 0.005 % ophthalmic solution Commonly known as: XALATAN Place 1 drop into both eyes at bedtime.   metFORMIN 500 MG  tablet Commonly known as: GLUCOPHAGE Take 500 mg by mouth daily.   metoprolol succinate 25 MG 24 hr tablet Commonly known as: TOPROL-XL Take 12.5 mg by mouth daily.   multivitamin capsule Take 1 capsule by mouth at bedtime.   Prolia 60 MG/ML Sosy injection Generic drug: denosumab Inject 60 mg into the skin every 6 (six) months.   QC TUMERIC COMPLEX PO Take 30 mLs by mouth daily.   rosuvastatin 40 MG tablet Commonly known as: CRESTOR TAKE 1 TABLET BY MOUTH DAILY   tamsulosin 0.4 MG Caps capsule Commonly known as: FLOMAX Take 0.4 mg by mouth at bedtime.   timolol 0.5 % ophthalmic solution Commonly known as: TIMOPTIC Place 1 drop into both eyes every morning.   VITAMIN B12 PO Place 5,000 mcg under the tongue daily.   vitamin D3 50 MCG (2000 UT) Caps Take 2,000 Units by mouth daily.        Review of Systems  Constitutional:  Negative for appetite change, fatigue and fever.  HENT:  Positive for hearing loss. Negative for congestion and trouble swallowing.   Eyes:  Positive for visual disturbance.       Low vision  Respiratory:  Negative for cough and wheezing.   Cardiovascular:  Positive for leg swelling.  Gastrointestinal:  Negative for abdominal pain and constipation.       Suprapubic region discomfort.   Genitourinary:  Positive for frequency. Negative for dysuria and hematuria.  Musculoskeletal:  Positive for arthralgias and gait problem.  Skin:  Negative for color change.  Neurological:  Positive for numbness. Negative for weakness and headaches.       Hx of numbness in legs.   Psychiatric/Behavioral:  Positive for self-injury. Negative for behavioral problems and sleep disturbance. The patient is not nervous/anxious.     Immunization History  Administered Date(s) Administered   Influenza-Unspecified 06/02/2014   Tdap 08/25/2022   Zoster Recombinat (Shingrix) 06/18/2018, 08/28/2018   Pertinent  Health Maintenance Due  Topic Date Due   OPHTHALMOLOGY  EXAM  Never done   INFLUENZA VACCINE  04/03/2023   HEMOGLOBIN A1C  08/01/2023   FOOT EXAM  02/06/2024  03/28/2019    8:25 AM 03/22/2022   11:09 AM 08/06/2022    9:18 PM 08/25/2022    6:51 PM 02/04/2023    4:10 PM  Fall Risk  Falls in the past year?     1  Was there an injury with Fall?     1  Fall Risk Category Calculator     3  (RETIRED) Patient Fall Risk Level High fall risk Moderate fall risk Low fall risk Low fall risk   Patient at Risk for Falls Due to     History of fall(s);Impaired balance/gait;Impaired mobility  Fall risk Follow up     Falls evaluation completed   Functional Status Survey:    Vitals:   02/12/23 1445 02/13/23 1158  BP: (!) 91/55 119/79  Pulse: 75   Resp: 20   Temp: 97.6 F (36.4 C)   SpO2: 91%   Weight: 151 lb 9.6 oz (68.8 kg)    Body mass index is 22.39 kg/m. Physical Exam Vitals and nursing note reviewed.  Constitutional:      Appearance: Normal appearance.  HENT:     Head: Normocephalic and atraumatic.     Nose: Nose normal.     Mouth/Throat:     Mouth: Mucous membranes are moist.     Comments:   Eyes:     Extraocular Movements: Extraocular movements intact.     Conjunctiva/sclera: Conjunctivae normal.     Pupils: Pupils are equal, round, and reactive to light.  Cardiovascular:     Rate and Rhythm: Normal rate and regular rhythm.     Heart sounds: No murmur heard. Pulmonary:     Effort: Pulmonary effort is normal.     Breath sounds: No rales.  Abdominal:     General: Bowel sounds are normal.     Palpations: Abdomen is soft.     Tenderness: There is no abdominal tenderness.  Musculoskeletal:        General: Tenderness present.     Cervical back: Normal range of motion and neck supple.     Right lower leg: Edema present.     Left lower leg: Edema present.     Comments: Trace edema in ankles. Left arm below the shoulder/acromial deltoid muscle pain with movement and palpated, but no decreased ROM or deformity.   Skin:     General: Skin is warm and dry.  Neurological:     General: No focal deficit present.     Mental Status: He is alert and oriented to person, place, and time. Mental status is at baseline.     Gait: Gait abnormal.  Psychiatric:        Mood and Affect: Mood normal.        Behavior: Behavior normal.        Thought Content: Thought content normal.        Judgment: Judgment normal.     Labs reviewed: Recent Labs    08/25/22 1848 01/29/23 1606 01/30/23 0508  NA 137 134* 136  K 4.3 4.7 3.6  CL 104 102 108  CO2 25 22 20*  GLUCOSE 171* 211* 135*  BUN 25* 33* 26*  CREATININE 0.86 0.98 0.88  CALCIUM 9.7 9.5 8.5*  MG  --   --  2.1  PHOS  --   --  3.4   Recent Labs    08/06/22 1933 01/29/23 1606 01/30/23 0508  AST 69* 30 22  ALT 66* 33 24  ALKPHOS 59 42 33*  BILITOT 1.1 1.3* 1.2  PROT 6.5 7.4 5.6*  ALBUMIN 3.6 3.9 2.9*   Recent Labs    08/25/22 1848 01/29/23 1606 01/30/23 0508  WBC 5.2 8.7 5.9  NEUTROABS  --  7.2  --   HGB 13.0 13.3 10.2*  HCT 40.8 41.1 31.1*  MCV 100.2* 97.4 96.9  PLT 161 161 109*   Lab Results  Component Value Date   TSH 0.885 03/23/2019   Lab Results  Component Value Date   HGBA1C 7.8 (H) 01/29/2023   Lab Results  Component Value Date   CHOL 131 12/10/2013   HDL 51 12/10/2013   LDLCALC 60 12/10/2013   TRIG 53 03/23/2019   CHOLHDL 2.6 12/10/2013    Significant Diagnostic Results in last 30 days:  No results found.  Assessment/Plan: Osteoarthritis, multiple sites c/o left arm pain with movement, no decreased ROM, denied injury, no apparent bruise, deformity, or redness. The patient stated Tylenol is not effective, will try Norco 5/325mg  1/2 po daily for now, apply Voltaren gel to the deltoid muscle under the acromial process. Observe.   Hypotension Bp measurements low, dc Metoprolol 12.5mg  qd. Obtain Ortho Bp x3 sets.   Gross hematuria f/u Urology and hospital evaluation for hematuria, resolved, Plavix was resumed by Urology.    Paroxysmal tachycardia (HCC)  02/12/23 off Metoprolol 2/2 low Bp,  TSH 2.91 01/02/23  Dyslipidemia, goal LDL below 70  on Zetia, Fish oil, Rosuvastatin, LDL 57 01/02/23  Hereditary and idiopathic peripheral neuropathy on Gabapentin, ambulates with walker.   Vitamin B12 deficiency on Vit B12, Vit B12 877, Hgb 10.6 02/11/23  Vitamin D deficiency  on Vit D3, Vit D 51 01/02/23  Two-vessel CAD status post CABG x2;   GABG x2, post Op afib, takes Plavix, Metoprolol, Jardiance, and statin  Urinary frequency Hx of bladder neoplasm,  2-3x nocturnal urination, no difficulty returning asleep, on Tamsulosin.   Type 2 diabetes mellitus (HCC) Hgb A1c 8.2 01/02/23>>7.8 01/29/23 since started Meformin, Jardiance, Bun/creat 18/0.97 02/11/23  Blood loss anemia blood loss, Hgb 10.4 02/11/23    Family/ staff Communication: plan of care reviewed with the patient and charge nurse.   Labs/tests ordered:  none  Time spend 40 minutes.

## 2023-02-12 NOTE — Assessment & Plan Note (Signed)
on Vit D3, Vit D 51 01/02/23 

## 2023-02-12 NOTE — Assessment & Plan Note (Signed)
on Vit B12, Vit B12 877, Hgb 10.6 02/11/23

## 2023-02-18 ENCOUNTER — Encounter: Payer: Self-pay | Admitting: Nurse Practitioner

## 2023-02-18 ENCOUNTER — Non-Acute Institutional Stay: Payer: Medicare Other | Admitting: Nurse Practitioner

## 2023-02-18 DIAGNOSIS — L740 Miliaria rubra: Secondary | ICD-10-CM | POA: Insufficient documentation

## 2023-02-18 DIAGNOSIS — R310 Gross hematuria: Secondary | ICD-10-CM

## 2023-02-18 DIAGNOSIS — I959 Hypotension, unspecified: Secondary | ICD-10-CM | POA: Diagnosis not present

## 2023-02-18 DIAGNOSIS — M159 Polyosteoarthritis, unspecified: Secondary | ICD-10-CM

## 2023-02-18 DIAGNOSIS — I2510 Atherosclerotic heart disease of native coronary artery without angina pectoris: Secondary | ICD-10-CM

## 2023-02-18 DIAGNOSIS — D500 Iron deficiency anemia secondary to blood loss (chronic): Secondary | ICD-10-CM

## 2023-02-18 DIAGNOSIS — R350 Frequency of micturition: Secondary | ICD-10-CM

## 2023-02-18 DIAGNOSIS — G609 Hereditary and idiopathic neuropathy, unspecified: Secondary | ICD-10-CM

## 2023-02-18 DIAGNOSIS — I479 Paroxysmal tachycardia, unspecified: Secondary | ICD-10-CM

## 2023-02-18 DIAGNOSIS — E1165 Type 2 diabetes mellitus with hyperglycemia: Secondary | ICD-10-CM

## 2023-02-18 NOTE — Assessment & Plan Note (Signed)
Hx of bladder neoplasm,  2-3x nocturnal urination, no difficulty returning asleep, on Tamsulosin.  

## 2023-02-18 NOTE — Progress Notes (Signed)
Location:   [facility] Room Number: 926 Place of Service:  ALF (13) Provider: Arna Snipe Abdimalik Mayorquin NP  [name], MD  Patient Care Team: [name], MD as PCP - General (Internal Medicine) [name], MD as PCP - Cardiology (Cardiology)  Extended Emergency Contact Information Primary Emergency Contact: [name],[name] Mobile Phone: [phone] Relation: Nephew Preferred language: English Interpreter needed? No Secondary Emergency Contact: wright,[name] Mobile Phone: [phone] Relation: Niece  Code Status: DNR Goals of care: Advanced Directive information    02/04/2023    4:09 PM  Advanced Directives  Does Patient Have a Medical Advance Directive? Yes  Type of Advance Directive Out of facility DNR (pink MOST or yellow form)  Does patient want to make changes to medical advance directive? No - Patient declined     Chief Complaint  Patient presents with   Acute Visit    The right buttock rash.     HPI:  Pt is a 89 y.o. male seen today for an acute visit for right buttock heat rash, itching, satellite pattern, small open area from scratch.     The left arm pain with movement, better pain controlled on Norco 5/325 1/2 tab every day, Voltaren gel.              Bp normalized, off Metoprolol.  Hematuria, resolved, Plavix was resumed by Urology 02/12/23             Hospitalized 01/29/23-02/01/23 for gross hematuria, f/u urology              Anemia, blood loss, Hgb 10.4 02/11/23 T2DM, Hgb A1c 8.2 01/02/23>>7.8 01/29/23 since started Meformin, Jardiance, Bun/creat 18/0.97 02/11/23             Urinary frequency, Hx of bladder neoplasm,  2-3x nocturnal urination, no difficulty returning asleep, on Tamsulosin.              CAD, GABG x2, post Op afib, takes Plavix, Metoprolol, Jardiance, and statin             Vitamin D deficiency, on Vit D3, Vit D 51 01/02/23             Vit B12 deficiency, on Vit B12, Vit B12 877, Hgb 10.6 02/11/23             Peripheral neuropathy, on  Gabapentin, ambulates with walker.  Glaucoma, wear glasses, on Timolol, Latanoprost HLD on Zetia, Fish oil, Rosuvastatin, LDL 57 01/02/23 Paroxysmal tachycardia, 02/12/23 off Metoprolol 2/2 low Bp,  TSH 2.91 01/02/23       Past Medical History:  Diagnosis Date   Anticoagulant long-term use    plavix  (for hx TIA)   Arthritis    right shoulder    Benign essential tremor    hands -- occasional   CAD S/P percutaneous coronary angioplasty cardiologist-- dr harding   a) PCI and BMS to LAD in 1991; b) CATH 10/2010: Severe 2 Vessel disease = diffuse RCA ( 75% prox, 90% mid & 70 + 75% distal) & LAD (80-90% pre-stent, 75% post-stent) with occluded D1 (filled via OM-D1 collaterals)  -- CABG x 2; c) Myoview 4/'15: INTERMEDIATE RISK - small sized reversible apical/anterolateral defect -- most likely c/w known occlusion of D1 with collaterals.(med Rx)   History of basal cell carcinoma (BCC) excision    History of bladder cancer urologist-- dr eskridge   dx 11/ 2015-- s/p TURBT   History of kidney stones    History of TIA (transient ischemic attack) 12/09/2013  History of urethral stricture    Hyperlipidemia with target LDL less than 70    controlled.   Neuropathy of lower extremity    Nocturia    Osteoporosis    Peripheral neuropathy    lower extremity   Postoperative atrial fibrillation (HCC) 10/2010   after bypass-"shocked back in and no problems since":   per cardiologist note, [name], no recurrence   Pre-diabetes    S/P CABG x 2 10/18/2010   LIMA-LAD, SVG to RPDA (Dr. Cornelius Moras)   TGA (transient global amnesia)    "x3 for hours then goes away"   Transient Dilated idiopathic cardiomyopathy April 2015; 04/2014   a) in setting of TIA: EF 35-40% (reduced from 55-65 percent) mild AI, moderate and dilated aorta.;; b) Recheck Echo 04/2014: EF 55-60%, Gr 1 DD, mild-mod AI.     Wears glasses    Wears partial dentures    upper   Past Surgical History:  Procedure Laterality Date   APPENDECTOMY  as  teen   BASAL CELL CARCINOMA EXCISION  07/2014   CARDIAC CATHETERIZATION  10/17/2010   80%prox w/mild in-stent restenosis w/75% mid-LADstenosis aft the stent,multi severe stenoses RCAw/heavily calcified vessel,norm LV   CARDIOVERSION  2012   after bypass surgery   CATARACT EXTRACTION W/ INTRAOCULAR LENS  IMPLANT, BILATERAL  2009  approx.   COCCYX REMOVAL     CORONARY ANGIOPLASTY WITH STENT PLACEMENT  1991   PCI and BMS to LAD   CORONARY ARTERY BYPASS GRAFT  10-18-2010  [name] @[facility]    LIMA-LAD, SVG-rPDA   CYSTOSCOPY W/ RETROGRADES N/A 07/12/2014   Procedure: CYSTOSCOPY WITH RETROGRADE PYELOGRAM;  Surgeon: [name], MD;  Location: [facility];  Service: Urology;  Laterality: N/A;   CYSTOSCOPY WITH BIOPSY N/A 09/09/2014   Procedure: CYSTO WITH BIOPSY AND FULGERATION, DILATION OF URETHRAL STRICTURE;  Surgeon: [name], MD;  Location: [facility];  Service: Urology;  Laterality: N/A;  BLADDER BIOPSY   CYSTOSCOPY WITH BIOPSY N/A 01/03/2015   Procedure: CYSTOSCOPY WITH BIOPSY;  Surgeon: [name], MD;  Location: [facility];  Service: Urology;  Laterality: N/A;   CYSTOSCOPY WITH BIOPSY N/A 01/26/2019   Procedure: CYSTOSCOPY WITH BIOPSY, BILATERAL RETROGRADE PYELOGRAM;  Surgeon: [name], MD;  Location: [facility];  Service: Urology;  Laterality: N/A;   CYSTOSCOPY WITH FULGERATION N/A 01/26/2019   Procedure: CYSTOSCOPY WITH FULGERATION;  Surgeon: [name], MD;  Location: [facility];  Service: Urology;  Laterality: N/A;   CYSTOSCOPY WITH URETHRAL DILATATION N/A 01/03/2015   Procedure: CYSTOSCOPY WITH URETHRAL DILATATION;  Surgeon: [name], MD;  Location: [facility];  Service: Urology;  Laterality: N/A;   DOPPLER ECHOCARDIOGRAPHY  10/18/2004   EF 55-65%,BORDERLINE -enlarged aortic root,mild aortic insuff.,trace tricus.insuff.   FINGER SURGERY Left 03-17-2001   dr [name]  @[facility]    repair complex laceration injury 5th digit   KIDNEY STONE SURGERY   1989   NM MYOVIEW LTD  12/16/2013   INTERMEDIATE RISK test with small sized reversible perfusion defect in the apical anterolateral wall -- most likely consistent with known occlusion of D1 with collaterals.   shoulder surgery Right 1988   TONSILLECTOMY  as teen   TRANSTHORACIC ECHOCARDIOGRAM  April 2015   EF 35-40%, mild AI, moderately dilated ascending aorta   TRANSURETHRAL RESECTION OF BLADDER TUMOR Right 06/11/2022   Procedure: TRANSURETHRAL RESECTION OF BLADDER TUMOR (TURBT)  >5cm;  Surgeon: [name], MD;  Location: [facility];  Service: Urology;  Laterality: Right;   TRANSURETHRAL RESECTION OF  PROSTATE N/A 07/12/2014   Procedure: TRANSURETHRAL RESECTION OF THE PROSTATE (TURP) WITH MITOMYCIN -C;  Surgeon: [name], MD;  Location: [facility];  Service: Urology;  Laterality: N/A;   TRANSURETHRAL RESECTION OF PROSTATE N/A 06/11/2022   Procedure: TRANSURETHRAL RESECTION OF THE PROSTATE (TURP);  Surgeon: [name], MD;  Location: [facility];  Service: Urology;  Laterality: N/A;  REQUESTING 2 HRS    No Known Allergies  Allergies as of 02/18/2023   No Known Allergies      Medication List        Accurate as of February 18, 2023 11:55 AM. If you have any questions, ask your nurse or doctor.          acetaminophen 500 MG tablet Commonly known as: TYLENOL Take 1,000 mg by mouth every 6 (six) hours as needed for mild pain.   ezetimibe 10 MG tablet Commonly known as: ZETIA Take 10 mg by mouth daily.   fish oil-omega-3 fatty acids 1000 MG capsule Take 1 g by mouth daily.   fluorouracil 5 % cream Commonly known as: EFUDEX Apply 1 Application topically 2 (two) times daily.   gabapentin 100 MG capsule Commonly known as: NEURONTIN Take 100 mg by mouth 2 (two) times daily.   Jardiance 10 MG Tabs tablet Generic drug: empagliflozin Take 10 mg by mouth daily.   latanoprost 0.005 % ophthalmic solution Commonly known as: XALATAN Place 1 drop into both eyes at bedtime.    metFORMIN 500 MG tablet Commonly known as: GLUCOPHAGE Take 500 mg by mouth daily.   metoprolol succinate 25 MG 24 hr tablet Commonly known as: TOPROL-XL Take 12.5 mg by mouth daily.   multivitamin capsule Take 1 capsule by mouth at bedtime.   Prolia 60 MG/ML Sosy injection Generic drug: denosumab Inject 60 mg into the skin every 6 (six) months.   QC TUMERIC COMPLEX PO Take 30 mLs by mouth daily.   rosuvastatin 40 MG tablet Commonly known as: CRESTOR TAKE 1 TABLET BY MOUTH DAILY   tamsulosin 0.4 MG Caps capsule Commonly known as: FLOMAX Take 0.4 mg by mouth at bedtime.   timolol 0.5 % ophthalmic solution Commonly known as: TIMOPTIC Place 1 drop into both eyes every morning.   VITAMIN B12 PO Place 5,000 mcg under the tongue daily.   vitamin D3 50 MCG (2000 UT) Caps Take 2,000 Units by mouth daily.        Review of Systems  Constitutional:  Negative for appetite change, fatigue and fever.  HENT:  Positive for hearing loss. Negative for congestion and trouble swallowing.   Eyes:  Positive for visual disturbance.       Low vision  Respiratory:  Negative for cough and wheezing.   Cardiovascular:  Positive for leg swelling.  Gastrointestinal:  Negative for abdominal pain and constipation.  Genitourinary:  Positive for frequency. Negative for dysuria and hematuria.  Musculoskeletal:  Positive for arthralgias and gait problem.  Skin:  Positive for rash.  Neurological:  Positive for numbness. Negative for weakness and headaches.       Hx of numbness in legs.   Psychiatric/Behavioral:  Positive for self-injury. Negative for behavioral problems and sleep disturbance. The patient is not nervous/anxious.     Immunization History  Administered Date(s) Administered   Influenza-Unspecified 06/02/2014   Tdap 08/25/2022   Zoster Recombinat (Shingrix) 06/18/2018, 08/28/2018   Pertinent  Health Maintenance Due  Topic Date Due   OPHTHALMOLOGY EXAM  Never done   INFLUENZA  VACCINE  04/03/2023  HEMOGLOBIN A1C  08/01/2023   FOOT EXAM  02/06/2024      03/28/2019    8:25 AM 03/22/2022   11:09 AM 08/06/2022    9:18 PM 08/25/2022    6:51 PM 02/04/2023    4:10 PM  Fall Risk  Falls in the past year?     1  Was there an injury with Fall?     1  Fall Risk Category Calculator     3  (RETIRED) Patient Fall Risk Level High fall risk Moderate fall risk Low fall risk Low fall risk   Patient at Risk for Falls Due to     History of fall(s);Impaired balance/gait;Impaired mobility  Fall risk Follow up     Falls evaluation completed   Functional Status Survey:    Vitals:   02/18/23 1141  BP: (!) 140/78  Pulse: 64  Resp: 20  Temp: 97.6 F (36.4 C)  SpO2: 91%  Weight: 151 lb 9.6 oz (68.8 kg)   Body mass index is 22.39 kg/m. Physical Exam Vitals and nursing note reviewed.  Constitutional:      Appearance: Normal appearance.  HENT:     Head: Normocephalic and atraumatic.     Nose: Nose normal.     Mouth/Throat:     Mouth: Mucous membranes are moist.     Comments:   Eyes:     Extraocular Movements: Extraocular movements intact.     Conjunctiva/sclera: Conjunctivae normal.     Pupils: Pupils are equal, round, and reactive to light.  Cardiovascular:     Rate and Rhythm: Normal rate and regular rhythm.     Heart sounds: No murmur heard. Pulmonary:     Effort: Pulmonary effort is normal.     Breath sounds: No rales.  Abdominal:     General: Bowel sounds are normal.     Palpations: Abdomen is soft.     Tenderness: There is no abdominal tenderness.  Musculoskeletal:        General: Tenderness present.     Cervical back: Normal range of motion and neck supple.     Right lower leg: Edema present.     Left lower leg: Edema present.     Comments: Trace edema in ankles. Left arm below the shoulder/acromial deltoid muscle pain with movement and palpated, but no decreased ROM or deformity, better pain controlled.   Skin:    General: Skin is warm and dry.      Findings: Rash present.     Comments: The right buttock satellite patterned rash, itching, a small open area from scratching.   Neurological:     General: No focal deficit present.     Mental Status: He is alert and oriented to person, place, and time. Mental status is at baseline.     Gait: Gait abnormal.  Psychiatric:        Mood and Affect: Mood normal.        Behavior: Behavior normal.        Thought Content: Thought content normal.        Judgment: Judgment normal.     Labs reviewed: Recent Labs    08/25/22 1848 01/29/23 1606 01/30/23 0508  NA 137 134* 136  K 4.3 4.7 3.6  CL 104 102 108  CO2 25 22 20*  GLUCOSE 171* 211* 135*  BUN 25* 33* 26*  CREATININE 0.86 0.98 0.88  CALCIUM 9.7 9.5 8.5*  MG  --   --  2.1  PHOS  --   --  3.4   Recent Labs    08/06/22 1933 01/29/23 1606 01/30/23 0508  AST 69* 30 22  ALT 66* 33 24  ALKPHOS 59 42 33*  BILITOT 1.1 1.3* 1.2  PROT 6.5 7.4 5.6*  ALBUMIN 3.6 3.9 2.9*   Recent Labs    08/25/22 1848 01/29/23 1606 01/30/23 0508  WBC 5.2 8.7 5.9  NEUTROABS  --  7.2  --   HGB 13.0 13.3 10.2*  HCT 40.8 41.1 31.1*  MCV 100.2* 97.4 96.9  PLT 161 161 109*   Lab Results  Component Value Date   TSH 0.885 03/23/2019   Lab Results  Component Value Date   HGBA1C 7.8 (H) 01/29/2023   Lab Results  Component Value Date   CHOL 131 12/10/2013   HDL 51 12/10/2013   LDLCALC 60 12/10/2013   TRIG 53 03/23/2019   CHOLHDL 2.6 12/10/2013    Significant Diagnostic Results in last 30 days:  No results found.  Assessment/Plan: Heat rash  The right buttock heat rash, itching, satellite pattern, small open area from scratch. Apply Nystatin crm, 2.5% Hydrocortisone crm bid until healed   Osteoarthritis, multiple sites  The left arm pain with movement, better pain controlled on Norco 5/325 1/2 tab every day, Voltaren gel.   Hypotension  Bp normalized, off Metoprolol.   Hematuria  resolved, Plavix was resumed by Urology  02/12/23  Blood loss anemia blood loss, Hgb 10.4 02/11/23  Type 2 diabetes mellitus (HCC)  Hgb A1c 8.2 01/02/23>>7.8 01/29/23 since started Meformin, Jardiance, Bun/creat 18/0.97 02/11/23  Urinary frequency Hx of bladder neoplasm,  2-3x nocturnal urination, no difficulty returning asleep, on Tamsulosin.   Two-vessel CAD status post CABG x2;  GABG x2, post Op afib, takes Plavix, Metoprolol, Jardiance, and statin  Hereditary and idiopathic peripheral neuropathy on Gabapentin, ambulates with walker.     Family/ staff Communication: plan of care reviewed with the patient and charge nurse.   Labs/tests ordered:  none  Time spend 40 minutes.

## 2023-02-18 NOTE — Assessment & Plan Note (Signed)
on Gabapentin, ambulates with walker.  

## 2023-02-18 NOTE — Assessment & Plan Note (Signed)
Heart rate is in control,  02/12/23 off Metoprolol 2/2 low Bp,  TSH 2.91 01/02/23

## 2023-02-18 NOTE — Assessment & Plan Note (Signed)
The left arm pain with movement, better pain controlled on Norco 5/325 1/2 tab every day, Voltaren gel.

## 2023-02-18 NOTE — Assessment & Plan Note (Signed)
Hgb A1c 8.2 01/02/23>>7.8 01/29/23 since started Meformin, Jardiance, Bun/creat 18/0.97 02/11/23 

## 2023-02-18 NOTE — Progress Notes (Signed)
This encounter was created in error - please disregard.

## 2023-02-18 NOTE — Assessment & Plan Note (Signed)
GABG x2, post Op afib, takes Plavix, Metoprolol, Jardiance, and statin 

## 2023-02-18 NOTE — Assessment & Plan Note (Signed)
blood loss, Hgb 10.4 02/11/23 

## 2023-02-18 NOTE — Assessment & Plan Note (Signed)
resolved, Plavix was resumed by Urology 02/12/23

## 2023-02-18 NOTE — Assessment & Plan Note (Signed)
Bp normalized, off Metoprolol.

## 2023-02-18 NOTE — Assessment & Plan Note (Signed)
The right buttock heat rash, itching, satellite pattern, small open area from scratch. Apply Nystatin crm, 2.5% Hydrocortisone crm bid until healed

## 2023-02-20 LAB — LIPID PANEL
Cholesterol: 121 (ref 0–200)
HDL: 43 (ref 35–70)
LDL Cholesterol: 56
Triglycerides: 134 (ref 40–160)

## 2023-02-25 ENCOUNTER — Telehealth: Payer: Self-pay | Admitting: *Deleted

## 2023-02-25 NOTE — Patient Outreach (Signed)
  Care Coordination   02/25/2023 Name: KOLBEY TEICHERT MRN: [id number] DOB: [birth year]   Care Coordination Outreach Attempts:  An unsuccessful telephone outreach was attempted today to offer the patient information about available care coordination services.  Follow Up Plan:  Additional outreach attempts will be made to offer the patient care coordination information and services.   Encounter Outcome:  No Answer   Care Coordination Interventions:  No, not indicated    Reece Levy, MSW, LCSW Clinical Social Worker [facility] [phone]

## 2023-03-03 ENCOUNTER — Telehealth: Payer: Self-pay | Admitting: *Deleted

## 2023-03-03 NOTE — Patient Outreach (Signed)
  Care Coordination   03/03/2023 Name: AVIEL GUTTER MRN: [id number] DOB: [birth year]   Care Coordination Outreach Attempts:  A second unsuccessful outreach was attempted today to offer the patient with information about available care coordination services.  Follow Up Plan:  Additional outreach attempts will be made to offer the patient care coordination information and services.   Encounter Outcome:  No Answer   Care Coordination Interventions:  No, not indicated   Reece Levy, MSW, LCSW Clinical Social Worker [facility] [phone]

## 2023-03-04 ENCOUNTER — Encounter: Payer: Self-pay | Admitting: Nurse Practitioner

## 2023-03-04 ENCOUNTER — Non-Acute Institutional Stay: Payer: Medicare Other | Admitting: Nurse Practitioner

## 2023-03-04 DIAGNOSIS — M150 Primary generalized (osteo)arthritis: Secondary | ICD-10-CM

## 2023-03-04 DIAGNOSIS — D500 Iron deficiency anemia secondary to blood loss (chronic): Secondary | ICD-10-CM

## 2023-03-04 DIAGNOSIS — M159 Polyosteoarthritis, unspecified: Secondary | ICD-10-CM | POA: Diagnosis not present

## 2023-03-04 DIAGNOSIS — E559 Vitamin D deficiency, unspecified: Secondary | ICD-10-CM

## 2023-03-04 DIAGNOSIS — R350 Frequency of micturition: Secondary | ICD-10-CM

## 2023-03-04 DIAGNOSIS — R310 Gross hematuria: Secondary | ICD-10-CM | POA: Diagnosis not present

## 2023-03-04 DIAGNOSIS — I959 Hypotension, unspecified: Secondary | ICD-10-CM | POA: Diagnosis not present

## 2023-03-04 DIAGNOSIS — E538 Deficiency of other specified B group vitamins: Secondary | ICD-10-CM

## 2023-03-04 DIAGNOSIS — E1165 Type 2 diabetes mellitus with hyperglycemia: Secondary | ICD-10-CM

## 2023-03-04 DIAGNOSIS — H409 Unspecified glaucoma: Secondary | ICD-10-CM

## 2023-03-04 DIAGNOSIS — I479 Paroxysmal tachycardia, unspecified: Secondary | ICD-10-CM

## 2023-03-04 DIAGNOSIS — L89302 Pressure ulcer of unspecified buttock, stage 2: Secondary | ICD-10-CM

## 2023-03-04 DIAGNOSIS — I2510 Atherosclerotic heart disease of native coronary artery without angina pectoris: Secondary | ICD-10-CM

## 2023-03-04 DIAGNOSIS — E785 Hyperlipidemia, unspecified: Secondary | ICD-10-CM

## 2023-03-04 DIAGNOSIS — G609 Hereditary and idiopathic neuropathy, unspecified: Secondary | ICD-10-CM

## 2023-03-04 NOTE — Progress Notes (Signed)
Location:  [facility] Room Number: AL/926/A Place of Service:  ALF (13) Provider:  Reef Achterberg X, NP   Patient Care Team: [name], MD as PCP - General (Internal Medicine) [name], MD as PCP - Cardiology (Cardiology)  Extended Emergency Contact Information Primary Emergency Contact: [name],[name] Mobile Phone: [phone] Relation: Nephew Preferred language: English Interpreter needed? No Secondary Emergency Contact: wright,[name] Mobile Phone: [phone] Relation: Niece  Code Status:  DNR Goals of care: Advanced Directive information    02/04/2023    4:09 PM  Advanced Directives  Does Patient Have a Medical Advance Directive? Yes  Type of Advance Directive Out of facility DNR (pink MOST or yellow form)  Does patient want to make changes to medical advance directive? No - Patient declined     Chief Complaint  Patient presents with   Acute Visit    Patient is being seen for a pressure ulcer on right buttocks    HPI:  Pt is a 89 y.o. male seen today for an acute visit for the right buttock pressure ulcer, about a pea sized, no s/s of infection, incontinent of urine, sitting more, no mild redness buttocks blanchable.    The left arm pain with movement, better pain controlled on Norco 5/325 1/2 tab every day, Voltaren gel.              Bp normalized, off Metoprolol.  Hematuria, resolved, Plavix was resumed by Urology 02/12/23             Hospitalized 01/29/23-02/01/23 for gross hematuria, f/u urology              Anemia, blood loss, Hgb 10.4 02/11/23 T2DM, Hgb A1c 8.2 01/02/23>>7.8 01/29/23 since started Meformin, Jardiance, Bun/creat 18/0.97 02/11/23             Urinary frequency, Hx of bladder neoplasm,  2-3x nocturnal urination, no difficulty returning asleep, on Tamsulosin.              CAD, GABG x2, post Op afib, takes Plavix, Metoprolol, Jardiance, and statin             Vitamin D deficiency, on Vit D3, Vit D 51 01/02/23             Vit B12  deficiency, on Vit B12, Vit B12 877, Hgb 10.6 02/11/23             Peripheral neuropathy, on Gabapentin, ambulates with walker.  Glaucoma, wear glasses, on Timolol, Latanoprost HLD on Zetia, Fish oil, Rosuvastatin, LDL 57 01/02/23 Paroxysmal tachycardia, 02/12/23 off Metoprolol 2/2 low Bp,  TSH 2.91 01/02/23     Past Medical History:  Diagnosis Date   Anticoagulant long-term use    plavix  (for hx TIA)   Arthritis    right shoulder    Benign essential tremor    hands -- occasional   CAD S/P percutaneous coronary angioplasty cardiologist-- dr harding   a) PCI and BMS to LAD in 1991; b) CATH 10/2010: Severe 2 Vessel disease = diffuse RCA ( 75% prox, 90% mid & 70 + 75% distal) & LAD (80-90% pre-stent, 75% post-stent) with occluded D1 (filled via OM-D1 collaterals)  -- CABG x 2; c) Myoview 4/'15: INTERMEDIATE RISK - small sized reversible apical/anterolateral defect -- most likely c/w known occlusion of D1 with collaterals.(med Rx)   History of basal cell carcinoma (BCC) excision    History of bladder cancer urologist-- dr eskridge   dx 11/ 2015-- s/p TURBT  History of kidney stones    History of TIA (transient ischemic attack) 12/09/2013   History of urethral stricture    Hyperlipidemia with target LDL less than 70    controlled.   Neuropathy of lower extremity    Nocturia    Osteoporosis    Peripheral neuropathy    lower extremity   Postoperative atrial fibrillation (HCC) 10/2010   after bypass-"shocked back in and no problems since":   per cardiologist note, [name], no recurrence   Pre-diabetes    S/P CABG x 2 10/18/2010   LIMA-LAD, SVG to RPDA (Dr. Cornelius Moras)   TGA (transient global amnesia)    "x3 for hours then goes away"   Transient Dilated idiopathic cardiomyopathy April 2015; 04/2014   a) in setting of TIA: EF 35-40% (reduced from 55-65 percent) mild AI, moderate and dilated aorta.;; b) Recheck Echo 04/2014: EF 55-60%, Gr 1 DD, mild-mod AI.     Wears glasses    Wears partial  dentures    upper   Past Surgical History:  Procedure Laterality Date   APPENDECTOMY  as teen   BASAL CELL CARCINOMA EXCISION  07/2014   CARDIAC CATHETERIZATION  10/17/2010   80%prox w/mild in-stent restenosis w/75% mid-LADstenosis aft the stent,multi severe stenoses RCAw/heavily calcified vessel,norm LV   CARDIOVERSION  2012   after bypass surgery   CATARACT EXTRACTION W/ INTRAOCULAR LENS  IMPLANT, BILATERAL  2009  approx.   COCCYX REMOVAL     CORONARY ANGIOPLASTY WITH STENT PLACEMENT  1991   PCI and BMS to LAD   CORONARY ARTERY BYPASS GRAFT  10-18-2010  [name] @[facility]    LIMA-LAD, SVG-rPDA   CYSTOSCOPY W/ RETROGRADES N/A 07/12/2014   Procedure: CYSTOSCOPY WITH RETROGRADE PYELOGRAM;  Surgeon: [name], MD;  Location: [facility];  Service: Urology;  Laterality: N/A;   CYSTOSCOPY WITH BIOPSY N/A 09/09/2014   Procedure: CYSTO WITH BIOPSY AND FULGERATION, DILATION OF URETHRAL STRICTURE;  Surgeon: [name], MD;  Location: [facility];  Service: Urology;  Laterality: N/A;  BLADDER BIOPSY   CYSTOSCOPY WITH BIOPSY N/A 01/03/2015   Procedure: CYSTOSCOPY WITH BIOPSY;  Surgeon: [name], MD;  Location: [facility];  Service: Urology;  Laterality: N/A;   CYSTOSCOPY WITH BIOPSY N/A 01/26/2019   Procedure: CYSTOSCOPY WITH BIOPSY, BILATERAL RETROGRADE PYELOGRAM;  Surgeon: [name], MD;  Location: [facility];  Service: Urology;  Laterality: N/A;   CYSTOSCOPY WITH FULGERATION N/A 01/26/2019   Procedure: CYSTOSCOPY WITH FULGERATION;  Surgeon: [name], MD;  Location: [facility];  Service: Urology;  Laterality: N/A;   CYSTOSCOPY WITH URETHRAL DILATATION N/A 01/03/2015   Procedure: CYSTOSCOPY WITH URETHRAL DILATATION;  Surgeon: [name], MD;  Location: [facility];  Service: Urology;  Laterality: N/A;   DOPPLER ECHOCARDIOGRAPHY  10/18/2004   EF 55-65%,BORDERLINE -enlarged aortic root,mild aortic insuff.,trace tricus.insuff.   FINGER SURGERY Left  03-17-2001   dr [name]  @[facility]    repair complex laceration injury 5th digit   KIDNEY STONE SURGERY  1989   NM MYOVIEW LTD  12/16/2013   INTERMEDIATE RISK test with small sized reversible perfusion defect in the apical anterolateral wall -- most likely consistent with known occlusion of D1 with collaterals.   shoulder surgery Right 1988   TONSILLECTOMY  as teen   TRANSTHORACIC ECHOCARDIOGRAM  April 2015   EF 35-40%, mild AI, moderately dilated ascending aorta   TRANSURETHRAL RESECTION OF BLADDER TUMOR Right 06/11/2022   Procedure: TRANSURETHRAL RESECTION OF BLADDER TUMOR (TURBT)  >5cm;  Surgeon: [name],  MD;  Location: [facility];  Service: Urology;  Laterality: Right;   TRANSURETHRAL RESECTION OF PROSTATE N/A 07/12/2014   Procedure: TRANSURETHRAL RESECTION OF THE PROSTATE (TURP) WITH MITOMYCIN -C;  Surgeon: [name], MD;  Location: [facility];  Service: Urology;  Laterality: N/A;   TRANSURETHRAL RESECTION OF PROSTATE N/A 06/11/2022   Procedure: TRANSURETHRAL RESECTION OF THE PROSTATE (TURP);  Surgeon: [name], MD;  Location: [facility];  Service: Urology;  Laterality: N/A;  REQUESTING 2 HRS    No Known Allergies  Outpatient Encounter Medications as of 03/04/2023  Medication Sig   acetaminophen (TYLENOL) 500 MG tablet Take 1,000 mg by mouth every 6 (six) hours as needed for mild pain.   Cholecalciferol (VITAMIN D3) 50 MCG (2000 UT) CAPS Take 2,000 Units by mouth daily.   Cyanocobalamin (VITAMIN B12 PO) Place 5,000 mcg under the tongue daily.   denosumab (PROLIA) 60 MG/ML SOSY injection Inject 60 mg into the skin every 6 (six) months.   ezetimibe (ZETIA) 10 MG tablet Take 10 mg by mouth daily.   fish oil-omega-3 fatty acids 1000 MG capsule Take 1 g by mouth daily.    fluorouracil (EFUDEX) 5 % cream Apply 1 Application topically 2 (two) times daily.   gabapentin (NEURONTIN) 100 MG capsule Take 100 mg by mouth 2 (two) times daily.   JARDIANCE 10 MG TABS tablet Take 10 mg by  mouth daily.   latanoprost (XALATAN) 0.005 % ophthalmic solution Place 1 drop into both eyes at bedtime.   metFORMIN (GLUCOPHAGE) 500 MG tablet Take 500 mg by mouth daily.   metoprolol succinate (TOPROL-XL) 25 MG 24 hr tablet Take 12.5 mg by mouth daily.   Multiple Vitamin (MULTIVITAMIN) capsule Take 1 capsule by mouth at bedtime.    rosuvastatin (CRESTOR) 40 MG tablet TAKE 1 TABLET BY MOUTH DAILY (Patient taking differently: Take 40 mg by mouth daily.)   tamsulosin (FLOMAX) 0.4 MG CAPS capsule Take 0.4 mg by mouth at bedtime.   timolol (TIMOPTIC) 0.5 % ophthalmic solution Place 1 drop into both eyes every morning.   Turmeric (QC TUMERIC COMPLEX PO) Take 30 mLs by mouth daily.   Facility-Administered Encounter Medications as of 03/04/2023  Medication   gemcitabine (GEMZAR) chemo syringe for bladder instillation 2,000 mg    Review of Systems  Constitutional:  Negative for appetite change, fatigue and fever.  HENT:  Positive for hearing loss. Negative for congestion and trouble swallowing.   Eyes:  Positive for visual disturbance.       Low vision  Respiratory:  Negative for cough and wheezing.   Cardiovascular:  Positive for leg swelling.  Gastrointestinal:  Negative for abdominal pain and constipation.  Genitourinary:  Positive for frequency. Negative for dysuria and hematuria.  Musculoskeletal:  Positive for arthralgias and gait problem.  Skin:  Positive for wound. Negative for rash.  Neurological:  Positive for numbness. Negative for weakness and headaches.       Hx of numbness in legs.   Psychiatric/Behavioral:  Positive for self-injury. Negative for behavioral problems and sleep disturbance. The patient is not nervous/anxious.     Immunization History  Administered Date(s) Administered   Influenza-Unspecified 06/02/2014   Tdap 08/25/2022   Zoster Recombinant(Shingrix) 06/18/2018, 08/28/2018   Pertinent  Health Maintenance Due  Topic Date Due   OPHTHALMOLOGY EXAM  Never done    INFLUENZA VACCINE  04/03/2023   HEMOGLOBIN A1C  08/01/2023   FOOT EXAM  02/06/2024      03/28/2019    8:25 AM 03/22/2022  11:09 AM 08/06/2022    9:18 PM 08/25/2022    6:51 PM 02/04/2023    4:10 PM  Fall Risk  Falls in the past year?     1  Was there an injury with Fall?     1  Fall Risk Category Calculator     3  (RETIRED) Patient Fall Risk Level High fall risk Moderate fall risk Low fall risk Low fall risk   Patient at Risk for Falls Due to     History of fall(s);Impaired balance/gait;Impaired mobility  Fall risk Follow up     Falls evaluation completed   Functional Status Survey:    Vitals:   03/04/23 1311 03/10/23 1626  BP: (!) 147/74 (!) 147/47  Pulse: 83   Resp: 20   Temp: 97.6 F (36.4 C)   SpO2: 91%   Weight: 150 lb (68 kg)   Height: 5\' 9"  (1.753 m)    Body mass index is 22.15 kg/m. Physical Exam Vitals and nursing note reviewed.  Constitutional:      Appearance: Normal appearance.  HENT:     Head: Normocephalic and atraumatic.     Nose: Nose normal.     Mouth/Throat:     Mouth: Mucous membranes are moist.     Comments:   Eyes:     Extraocular Movements: Extraocular movements intact.     Conjunctiva/sclera: Conjunctivae normal.     Pupils: Pupils are equal, round, and reactive to light.  Cardiovascular:     Rate and Rhythm: Normal rate and regular rhythm.     Heart sounds: No murmur heard. Pulmonary:     Effort: Pulmonary effort is normal.     Breath sounds: No rales.  Abdominal:     General: Bowel sounds are normal.     Palpations: Abdomen is soft.     Tenderness: There is no abdominal tenderness.  Musculoskeletal:        General: Tenderness present.     Cervical back: Normal range of motion and neck supple.     Right lower leg: Edema present.     Left lower leg: Edema present.     Comments: Trace edema in ankles. Left arm below the shoulder/acromial deltoid muscle pain with movement and palpated, but no decreased ROM or deformity, better pain  controlled.   Skin:    General: Skin is warm and dry.     Comments:  the right buttock pressure ulcer, about a pea sized, no s/s of infection, blanchable redness R+L buttocks  Neurological:     General: No focal deficit present.     Mental Status: He is alert and oriented to person, place, and time. Mental status is at baseline.     Gait: Gait abnormal.  Psychiatric:        Mood and Affect: Mood normal.        Behavior: Behavior normal.        Thought Content: Thought content normal.        Judgment: Judgment normal.     Labs reviewed: Recent Labs    08/25/22 1848 01/29/23 1606 01/30/23 0508  NA 137 134* 136  K 4.3 4.7 3.6  CL 104 102 108  CO2 25 22 20*  GLUCOSE 171* 211* 135*  BUN 25* 33* 26*  CREATININE 0.86 0.98 0.88  CALCIUM 9.7 9.5 8.5*  MG  --   --  2.1  PHOS  --   --  3.4   Recent Labs    08/06/22 1933 01/29/23  1606 01/30/23 0508  AST 69* 30 22  ALT 66* 33 24  ALKPHOS 59 42 33*  BILITOT 1.1 1.3* 1.2  PROT 6.5 7.4 5.6*  ALBUMIN 3.6 3.9 2.9*   Recent Labs    08/25/22 1848 01/29/23 1606 01/30/23 0508  WBC 5.2 8.7 5.9  NEUTROABS  --  7.2  --   HGB 13.0 13.3 10.2*  HCT 40.8 41.1 31.1*  MCV 100.2* 97.4 96.9  PLT 161 161 109*   Lab Results  Component Value Date   TSH 0.885 03/23/2019   Lab Results  Component Value Date   HGBA1C 7.8 (H) 01/29/2023   Lab Results  Component Value Date   CHOL 131 12/10/2013   HDL 51 12/10/2013   LDLCALC 60 12/10/2013   TRIG 53 03/23/2019   CHOLHDL 2.6 12/10/2013    Significant Diagnostic Results in last 30 days:  No results found.  Assessment/Plan Pressure ulcer, stage 2 (HCC)  the right buttock pressure ulcer, about a pea sized, no s/s of infection, incontinent of urine, sitting more, no mild redness buttocks blanchable. Apply hydrocolloid dressing q3days and prn, avoid pressure and moist.   Osteoarthritis, multiple sites The left arm pain with movement, better pain controlled on Norco 5/325 1/2 tab every  day, Voltaren gel.   Hypotension normalized, off Metoprolol.   Gross hematuria  resolved, Plavix was resumed by Urology 02/12/23  Blood loss anemia blood loss, Hgb 10.4 02/11/23  Type 2 diabetes mellitus (HCC) Hgb A1c 8.2 01/02/23>>7.8 01/29/23 since started Meformin, Jardiance, Bun/creat 18/0.97 02/11/23  Urinary frequency Hx of bladder neoplasm,  2-3x nocturnal urination, no difficulty returning asleep, on Tamsulosin  Two-vessel CAD status post CABG x2;  GABG x2, post Op afib, takes Plavix, Metoprolol, Jardiance, and statin  Vitamin D deficiency on Vit D3, Vit D 51 01/02/23  Vitamin B12 deficiency on Vit B12, Vit B12 877, Hgb 10.6 02/11/23  Hereditary and idiopathic peripheral neuropathy  on Gabapentin, ambulates with walker.   Glaucoma wear glasses, on Timolol, Latanoprost  Dyslipidemia, goal LDL below 70 on Zetia, Fish oil, Rosuvastatin, LDL 57 01/02/23     Family/ staff Communication: plan of care reviewed with the patient and charge nurse.   Labs/tests ordered:  none  Time spend 40 minutes

## 2023-03-10 NOTE — Assessment & Plan Note (Signed)
on Zetia, Fish oil, Rosuvastatin, LDL 57 01/02/23 

## 2023-03-10 NOTE — Assessment & Plan Note (Signed)
Heart rate is in control,  02/12/23 off Metoprolol 2/2 low Bp,  TSH 2.91 01/02/23 

## 2023-03-10 NOTE — Assessment & Plan Note (Signed)
Hx of bladder neoplasm,  2-3x nocturnal urination, no difficulty returning asleep, on Tamsulosin.  

## 2023-03-10 NOTE — Assessment & Plan Note (Signed)
wear glasses, on Timolol, Latanoprost 

## 2023-03-10 NOTE — Assessment & Plan Note (Signed)
the right buttock pressure ulcer, about a pea sized, no s/s of infection, incontinent of urine, sitting more, no mild redness buttocks blanchable. Apply hydrocolloid dressing q3days and prn, avoid pressure and moist.

## 2023-03-10 NOTE — Assessment & Plan Note (Signed)
on Vit D3, Vit D 51 01/02/23 

## 2023-03-10 NOTE — Assessment & Plan Note (Signed)
The left arm pain with movement, better pain controlled on Norco 5/325 1/2 tab every day, Voltaren gel.  

## 2023-03-10 NOTE — Assessment & Plan Note (Signed)
Hgb A1c 8.2 01/02/23>>7.8 01/29/23 since started Meformin, Jardiance, Bun/creat 18/0.97 02/11/23 

## 2023-03-10 NOTE — Assessment & Plan Note (Signed)
normalized, off Metoprolol.

## 2023-03-10 NOTE — Assessment & Plan Note (Signed)
on Gabapentin, ambulates with walker.  

## 2023-03-10 NOTE — Assessment & Plan Note (Signed)
on Vit B12, Vit B12 877, Hgb 10.6 02/11/23 

## 2023-03-10 NOTE — Assessment & Plan Note (Signed)
resolved, Plavix was resumed by Urology 02/12/23 

## 2023-03-10 NOTE — Assessment & Plan Note (Signed)
GABG x2, post Op afib, takes Plavix, Metoprolol, Jardiance, and statin 

## 2023-03-10 NOTE — Assessment & Plan Note (Signed)
blood loss, Hgb 10.4 02/11/23 

## 2023-03-14 ENCOUNTER — Other Ambulatory Visit: Payer: Self-pay | Admitting: Orthopedic Surgery

## 2023-03-14 DIAGNOSIS — M159 Polyosteoarthritis, unspecified: Secondary | ICD-10-CM

## 2023-03-14 MED ORDER — HYDROCODONE-ACETAMINOPHEN 5-325 MG PO TABS
0.5000 | ORAL_TABLET | Freq: Every day | ORAL | 0 refills | Status: DC
Start: 2023-03-14 — End: 2023-05-14

## 2023-03-19 ENCOUNTER — Encounter: Payer: Self-pay | Admitting: Nurse Practitioner

## 2023-03-19 ENCOUNTER — Non-Acute Institutional Stay: Payer: Medicare Other | Admitting: Nurse Practitioner

## 2023-03-19 DIAGNOSIS — W19XXXA Unspecified fall, initial encounter: Secondary | ICD-10-CM

## 2023-03-19 DIAGNOSIS — M159 Polyosteoarthritis, unspecified: Secondary | ICD-10-CM | POA: Diagnosis not present

## 2023-03-19 DIAGNOSIS — G609 Hereditary and idiopathic neuropathy, unspecified: Secondary | ICD-10-CM

## 2023-03-19 DIAGNOSIS — E1165 Type 2 diabetes mellitus with hyperglycemia: Secondary | ICD-10-CM

## 2023-03-19 DIAGNOSIS — D500 Iron deficiency anemia secondary to blood loss (chronic): Secondary | ICD-10-CM

## 2023-03-19 DIAGNOSIS — R269 Unspecified abnormalities of gait and mobility: Secondary | ICD-10-CM

## 2023-03-19 DIAGNOSIS — R310 Gross hematuria: Secondary | ICD-10-CM

## 2023-03-19 DIAGNOSIS — L309 Dermatitis, unspecified: Secondary | ICD-10-CM | POA: Diagnosis not present

## 2023-03-19 DIAGNOSIS — E538 Deficiency of other specified B group vitamins: Secondary | ICD-10-CM

## 2023-03-19 DIAGNOSIS — E785 Hyperlipidemia, unspecified: Secondary | ICD-10-CM

## 2023-03-19 DIAGNOSIS — H409 Unspecified glaucoma: Secondary | ICD-10-CM

## 2023-03-19 DIAGNOSIS — I959 Hypotension, unspecified: Secondary | ICD-10-CM

## 2023-03-19 DIAGNOSIS — I479 Paroxysmal tachycardia, unspecified: Secondary | ICD-10-CM

## 2023-03-19 DIAGNOSIS — I2510 Atherosclerotic heart disease of native coronary artery without angina pectoris: Secondary | ICD-10-CM

## 2023-03-19 DIAGNOSIS — E559 Vitamin D deficiency, unspecified: Secondary | ICD-10-CM

## 2023-03-19 DIAGNOSIS — R350 Frequency of micturition: Secondary | ICD-10-CM

## 2023-03-19 DIAGNOSIS — M150 Primary generalized (osteo)arthritis: Secondary | ICD-10-CM

## 2023-03-19 NOTE — Assessment & Plan Note (Signed)
resolved, Plavix was resumed by Urology 02/12/23 

## 2023-03-19 NOTE — Assessment & Plan Note (Signed)
wear glasses, on Timolol, Latanoprost 

## 2023-03-19 NOTE — Assessment & Plan Note (Signed)
Hgb A1c 8.2 01/02/23>>7.8 01/29/23 since started Meformin, Jardiance, Bun/creat 18/0.97 02/11/23 

## 2023-03-19 NOTE — Progress Notes (Signed)
Location:   [facility] Room Number: [phone] Place of Service:  ALF [phone]) Provider:  Elward Nocera X, NP  [name], MD  Patient Care Team: [name], MD as PCP - General (Internal Medicine) [name], MD as PCP - Cardiology (Cardiology)  Extended Emergency Contact Information Primary Emergency Contact: [name],[name] Mobile Phone: [phone] Relation: Nephew Preferred language: English Interpreter needed? No Secondary Emergency Contact: wright,[name] Mobile Phone: [phone] Relation: Niece  Code Status:  DNR Goals of care: Advanced Directive information    03/19/2023    8:52 AM  Advanced Directives  Does Patient Have a Medical Advance Directive? Yes  Type of Advance Directive Out of facility DNR (pink MOST or yellow form)  Does patient want to make changes to medical advance directive? No - Patient declined  Pre-existing out of facility DNR order (yellow form or pink MOST form) Yellow form placed in chart (order not valid for inpatient use)     Chief Complaint  Patient presents with   Acute Visit    Left groin nodule    HPI:  Pt is a 89 y.o. [name] seen today for an acute visit for reported reddened raised about a lemon sized reddened scaly area left inner thigh next to his scrotum, denied pain, slightly itching noted.     The left arm pain with movement, better pain controlled on Norco 5/325 1/2 tab every day, Voltaren gel.              Bp normalized, off Metoprolol.  Hematuria, resolved, Plavix was resumed by Urology 02/12/23             Hospitalized 01/29/23-02/01/23 for gross hematuria, f/u urology              Anemia, blood loss, Hgb 10.4 02/11/23 T2DM, Hgb A1c 8.2 01/02/23>>7.8 01/29/23 since started Meformin, Jardiance, Bun/creat 18/0.97 02/11/23             Urinary frequency, Hx of bladder neoplasm,  2-3x nocturnal urination, no difficulty returning asleep, on Tamsulosin. 02/05/23 Urology resume Plavix. Cysto 3 months. BCG #1of 3(immuno  therapy)             CAD, GABG x2, post Op afib, takes Plavix, Metoprolol, Jardiance, and statin             Vitamin D deficiency, on Vit D3, Vit D 51 01/02/23             Vit B12 deficiency, on Vit B12, Vit B12 877, Hgb 10.6 02/11/23             Peripheral neuropathy, on Gabapentin, ambulates with walker.  Glaucoma, wear glasses, on Timolol, Latanoprost HLD on Zetia, Fish oil, Rosuvastatin, LDL 56 02/20/23 Paroxysmal tachycardia, 02/12/23 off Metoprolol 2/2 low Bp,  TSH 2.91 01/02/23   Past Medical History:  Diagnosis Date   Anticoagulant long-term use    plavix  (for hx TIA)   Arthritis    right shoulder    Benign essential tremor    hands -- occasional   CAD S/P percutaneous coronary angioplasty cardiologist-- dr harding   a) PCI and BMS to LAD in 1991; b) CATH 10/2010: Severe 2 Vessel disease = diffuse RCA ( 75% prox, 90% mid & 70 + 75% distal) & LAD (80-90% pre-stent, 75% post-stent) with occluded D1 (filled via OM-D1 collaterals)  -- CABG x 2; c) Myoview 4/'15: INTERMEDIATE RISK - small sized reversible apical/anterolateral defect -- most likely c/w known occlusion of D1 with collaterals.(med Rx)  History of basal cell carcinoma (BCC) excision    History of bladder cancer urologist-- dr eskridge   dx 11/ 2015-- s/p TURBT   History of kidney stones    History of TIA (transient ischemic attack) 12/09/2013   History of urethral stricture    Hyperlipidemia with target LDL less than 70    controlled.   Neuropathy of lower extremity    Nocturia    Osteoporosis    Peripheral neuropathy    lower extremity   Postoperative atrial fibrillation (HCC) 10/2010   after bypass-"shocked back in and no problems since":   per cardiologist note, [name], no recurrence   Pre-diabetes    S/P CABG x 2 10/18/2010   LIMA-LAD, SVG to RPDA (Dr. Cornelius Moras)   TGA (transient global amnesia)    "x3 for hours then goes away"   Transient Dilated idiopathic cardiomyopathy April 2015; 04/2014   a) in setting of  TIA: EF 35-40% (reduced from 55-65 percent) mild AI, moderate and dilated aorta.;; b) Recheck Echo 04/2014: EF 55-Tanner%, Gr 1 DD, mild-mod AI.     Wears glasses    Wears partial dentures    upper   Past Surgical History:  Procedure Laterality Date   APPENDECTOMY  as teen   BASAL CELL CARCINOMA EXCISION  07/2014   CARDIAC CATHETERIZATION  10/17/2010   80%prox w/mild in-stent restenosis w/75% mid-LADstenosis aft the stent,multi severe stenoses RCAw/heavily calcified vessel,norm LV   CARDIOVERSION  2012   after bypass surgery   CATARACT EXTRACTION W/ INTRAOCULAR LENS  IMPLANT, BILATERAL  2009  approx.   COCCYX REMOVAL     CORONARY ANGIOPLASTY WITH STENT PLACEMENT  1991   PCI and BMS to LAD   CORONARY ARTERY BYPASS GRAFT  10-18-2010  [name] @[facility]    LIMA-LAD, SVG-rPDA   CYSTOSCOPY W/ RETROGRADES N/A 07/12/2014   Procedure: CYSTOSCOPY WITH RETROGRADE PYELOGRAM;  Surgeon: [name], MD;  Location: [facility];  Service: Urology;  Laterality: N/A;   CYSTOSCOPY WITH BIOPSY N/A 09/09/2014   Procedure: CYSTO WITH BIOPSY AND FULGERATION, DILATION OF URETHRAL STRICTURE;  Surgeon: [name], MD;  Location: [facility];  Service: Urology;  Laterality: N/A;  BLADDER BIOPSY   CYSTOSCOPY WITH BIOPSY N/A 01/03/2015   Procedure: CYSTOSCOPY WITH BIOPSY;  Surgeon: [name], MD;  Location: [facility];  Service: Urology;  Laterality: N/A;   CYSTOSCOPY WITH BIOPSY N/A 01/26/2019   Procedure: CYSTOSCOPY WITH BIOPSY, BILATERAL RETROGRADE PYELOGRAM;  Surgeon: [name], MD;  Location: [facility];  Service: Urology;  Laterality: N/A;   CYSTOSCOPY WITH FULGERATION N/A 01/26/2019   Procedure: CYSTOSCOPY WITH FULGERATION;  Surgeon: [name], MD;  Location: [facility];  Service: Urology;  Laterality: N/A;   CYSTOSCOPY WITH URETHRAL DILATATION N/A 01/03/2015   Procedure: CYSTOSCOPY WITH URETHRAL DILATATION;  Surgeon: [name], MD;  Location: [facility];  Service:  Urology;  Laterality: N/A;   DOPPLER ECHOCARDIOGRAPHY  10/18/2004   EF 55-65%,BORDERLINE -enlarged aortic root,mild aortic insuff.,trace tricus.insuff.   FINGER SURGERY Left 03-17-2001   dr [name]  @[facility]    repair complex laceration injury 5th digit   KIDNEY STONE SURGERY  1989   NM MYOVIEW LTD  12/16/2013   INTERMEDIATE RISK test with small sized reversible perfusion defect in the apical anterolateral wall -- most likely consistent with known occlusion of D1 with collaterals.   shoulder surgery Right 1988   TONSILLECTOMY  as teen   TRANSTHORACIC ECHOCARDIOGRAM  April 2015   EF 35-40%, mild AI, moderately dilated  ascending aorta   TRANSURETHRAL RESECTION OF BLADDER TUMOR Right 06/11/2022   Procedure: TRANSURETHRAL RESECTION OF BLADDER TUMOR (TURBT)  >5cm;  Surgeon: [name], MD;  Location: [facility];  Service: Urology;  Laterality: Right;   TRANSURETHRAL RESECTION OF PROSTATE N/A 07/12/2014   Procedure: TRANSURETHRAL RESECTION OF THE PROSTATE (TURP) WITH MITOMYCIN -C;  Surgeon: [name], MD;  Location: [facility];  Service: Urology;  Laterality: N/A;   TRANSURETHRAL RESECTION OF PROSTATE N/A 06/11/2022   Procedure: TRANSURETHRAL RESECTION OF THE PROSTATE (TURP);  Surgeon: [name], MD;  Location: [facility];  Service: Urology;  Laterality: N/A;  REQUESTING 2 HRS    No Known Allergies  Allergies as of 03/19/2023   No Known Allergies      Medication List        Accurate as of March 19, 2023  2:08 PM. If you have any questions, ask your nurse or doctor.          STOP taking these medications    fluorouracil 5 % cream Commonly known as: EFUDEX Stopped by: Judythe Postema X Erionna Strum   metoprolol succinate 25 MG 24 hr tablet Commonly known as: TOPROL-XL Stopped by: Khylen Riolo X Gerica Koble   QC TUMERIC COMPLEX PO Stopped by: Taylin Leder X Sandip Power       TAKE these medications    acetaminophen 500 MG tablet Commonly known as: TYLENOL Take 1,000 mg by mouth every 6 (six) hours as needed for mild  pain.   Calcium Antacid Ultra Strength 1000 MG chewable tablet Generic drug: calcium elemental as carbonate Chew 1,000 mg by mouth daily.   clopidogrel 75 MG tablet Commonly known as: PLAVIX Take 75 mg by mouth daily.   ezetimibe 10 MG tablet Commonly known as: ZETIA Take 10 mg by mouth daily.   fish oil-omega-3 fatty acids 1000 MG capsule Take 1 g by mouth daily.   gabapentin 100 MG capsule Commonly known as: NEURONTIN Take 100 mg by mouth 2 (two) times daily.   HYDROcodone-acetaminophen 5-325 MG tablet Commonly known as: Norco Take 0.5 tablets by mouth daily.   Jardiance 10 MG Tabs tablet Generic drug: empagliflozin Take 10 mg by mouth daily.   latanoprost 0.005 % ophthalmic solution Commonly known as: XALATAN Place 1 drop into both eyes at bedtime.   metFORMIN 500 MG tablet Commonly known as: GLUCOPHAGE Take 500 mg by mouth daily.   multivitamin capsule Take 1 capsule by mouth at bedtime.   Prolia Tanner MG/ML Sosy injection Generic drug: denosumab Inject Tanner mg into the skin every 6 (six) months.   rosuvastatin 40 MG tablet Commonly known as: CRESTOR Take 40 mg by mouth daily. What changed: Another medication with the same name was removed. Continue taking this medication, and follow the directions you see here. Changed by: Daylan Boggess X Carmelia Tiner   tamsulosin 0.4 MG Caps capsule Commonly known as: FLOMAX Take 0.4 mg by mouth at bedtime.   timolol 0.5 % ophthalmic solution Commonly known as: TIMOPTIC Place 1 drop into both eyes every morning.   VITAMIN B12 PO Place 5,000 mcg under the tongue daily.   vitamin D3 50 MCG (2000 UT) Caps Take 2,000 Units by mouth daily.   Voltaren 1 % Gel Generic drug: diclofenac Sodium Apply topically 4 (four) times daily.        Review of Systems  Constitutional:  Negative for appetite change, fatigue and fever.  HENT:  Positive for hearing loss. Negative for congestion and trouble swallowing.   Eyes:  Positive for visual  disturbance.  Low vision  Respiratory:  Negative for cough and wheezing.   Cardiovascular:  Positive for leg swelling.  Gastrointestinal:  Negative for abdominal pain and constipation.  Genitourinary:  Positive for frequency. Negative for dysuria and hematuria.  Musculoskeletal:  Positive for arthralgias and gait problem.  Skin:  Positive for rash and wound.  Neurological:  Positive for numbness. Negative for weakness and headaches.       Hx of numbness in legs.   Psychiatric/Behavioral:  Positive for self-injury. Negative for behavioral problems and sleep disturbance. The patient is not nervous/anxious.     Immunization History  Administered Date(s) Administered   Influenza-Unspecified 06/02/2014   Tdap 08/25/2022   Zoster Recombinant(Shingrix) 06/18/2018, 08/28/2018   Pertinent  Health Maintenance Due  Topic Date Due   OPHTHALMOLOGY EXAM  Never done   INFLUENZA VACCINE  04/03/2023   HEMOGLOBIN A1C  08/01/2023   FOOT EXAM  02/06/2024      03/28/2019    8:25 AM 03/22/2022   11:09 AM 08/06/2022    9:18 PM 08/25/2022    6:51 PM 02/04/2023    4:10 PM  Fall Risk  Falls in the past year?     1  Was there an injury with Fall?     1  Fall Risk Category Calculator     3  (RETIRED) Patient Fall Risk Level High fall risk Moderate fall risk Low fall risk Low fall risk   Patient at Risk for Falls Due to     History of fall(s);Impaired balance/gait;Impaired mobility  Fall risk Follow up     Falls evaluation completed   Functional Status Survey:    Vitals:   03/19/23 0847  BP: 112/65  Pulse: 86  Resp: 20  Temp: (!) 97.2 F (36.2 C)  SpO2: 97%  Weight: 150 lb 9.6 oz (68.3 kg)  Height: 5\' 9"  (1.753 m)   Body mass index is 22.24 kg/m. Physical Exam Vitals and nursing note reviewed.  Constitutional:      Appearance: Normal appearance.  HENT:     Head: Normocephalic and atraumatic.     Nose: Nose normal.     Mouth/Throat:     Mouth: Mucous membranes are moist.      Comments:   Eyes:     Extraocular Movements: Extraocular movements intact.     Conjunctiva/sclera: Conjunctivae normal.     Pupils: Pupils are equal, round, and reactive to light.  Cardiovascular:     Rate and Rhythm: Normal rate and regular rhythm.     Heart sounds: No murmur heard. Pulmonary:     Effort: Pulmonary effort is normal.     Breath sounds: No rales.  Abdominal:     General: Bowel sounds are normal.     Palpations: Abdomen is soft.     Tenderness: There is no abdominal tenderness.  Musculoskeletal:        General: Tenderness present.     Cervical back: Normal range of motion and neck supple.     Right lower leg: Edema present.     Left lower leg: Edema present.     Comments: Trace edema in ankles. Left arm below the shoulder/acromial deltoid muscle pain with movement and palpated, but no decreased ROM or deformity, better pain controlled.   Skin:    General: Skin is warm and dry.     Findings: Rash present.     Comments:  the right buttock pressure ulcer, about a pea sized, no s/s of infection, blanchable redness R+L buttocks reddened  raised about a lemon sized reddened scaly area left inner thigh next to his scrotum, denied pain, slightly itching noted.    Neurological:     General: No focal deficit present.     Mental Status: He is alert and oriented to person, place, and time. Mental status is at baseline.     Gait: Gait abnormal.  Psychiatric:        Mood and Affect: Mood normal.        Behavior: Behavior normal.        Thought Content: Thought content normal.        Judgment: Judgment normal.     Labs reviewed: Recent Labs    08/25/22 1848 01/29/23 1606 01/30/23 0508 02/11/23 0000  NA 137 134* 136 139  K 4.3 4.7 3.6 4.6  CL 104 102 108 107  CO2 25 22 20* 25*  GLUCOSE 171* 211* 135*  --   BUN 25* 33* 26* 18  CREATININE 0.86 0.98 0.88 1.0  CALCIUM 9.7 9.5 8.5* 8.8  MG  --   --  2.1  --   PHOS  --   --  3.4  --    Recent Labs    08/06/22 1933  01/29/23 1606 01/30/23 0508  AST 69* 30 22  ALT 66* 33 24  ALKPHOS 59 42 33*  BILITOT 1.1 1.3* 1.2  PROT 6.5 7.4 5.6*  ALBUMIN 3.6 3.9 2.9*   Recent Labs    08/25/22 1848 01/29/23 1606 01/30/23 0508 02/11/23 0000  WBC 5.2 8.7 5.9 3.8  NEUTROABS  --  7.2  --   --   HGB 13.0 13.3 10.2* 10.6*  HCT 40.8 41.1 31.1* 32*  MCV 100.2* 97.4 96.9  --   PLT 161 161 109* 192   Lab Results  Component Value Date   TSH 0.885 03/23/2019   Lab Results  Component Value Date   HGBA1C 7.8 (H) 01/29/2023   Lab Results  Component Value Date   CHOL 121 02/20/2023   HDL 43 02/20/2023   LDLCALC 56 02/20/2023   TRIG 134 02/20/2023   CHOLHDL 2.6 12/10/2013    Significant Diagnostic Results in last 30 days:  No results found.  Assessment/Plan Dermatitis reddened raised about a lemon sized reddened scaly area left inner thigh next to his scrotum, denied pain, slightly itching noted. Moist and heat are contributory, apply 0.5% Triamcinolone, Nystatin cream bid to the affected area until healed.   Hypotension  Bp normalized, off Metoprolol.   Osteoarthritis, multiple sites  The left arm pain with movement, better pain controlled on Norco 5/325 1/2 tab every day, Voltaren gel.   Hematuria resolved, Plavix was resumed by Urology 02/12/23  Blood loss anemia blood loss, Hgb 10.4 02/11/23  Type 2 diabetes mellitus (HCC) Hgb A1c 8.2 01/02/23>>7.8 01/29/23 since started Meformin, Jardiance, Bun/creat 18/0.97 02/11/23  Urinary frequency Hx of bladder neoplasm,  2-3x nocturnal urination, no difficulty returning asleep, on Tamsulosin.   Two-vessel CAD status post CABG x2;  GABG x2, post Op afib, takes Plavix, Metoprolol, Jardiance, and statin  Vitamin D deficiency  on Vit D3, Vit D 51 01/02/23  Vitamin B12 deficiency on Vit B12, Vit B12 877, Hgb 10.6 02/11/23  Hereditary and idiopathic peripheral neuropathy on Gabapentin, ambulates with walker.   Gait abnormality Uses walker, unsteady,  fall today w/o apparent injury  Glaucoma wear glasses, on Timolol, Latanoprost  Dyslipidemia, goal LDL below 70  on Zetia, Fish oil, Rosuvastatin, LDL 56 02/20/23  Paroxysmal tachycardia (HCC) Heart  rate is in control,  02/12/23 off Metoprolol 2/2 low Bp,  TSH 2.91 01/02/23     Family/ staff Communication: plan of care reviewed with the patient and charge nurse.   Labs/tests ordered:   none  Time spend 40 minutes.

## 2023-03-19 NOTE — Assessment & Plan Note (Signed)
reddened raised about a lemon sized reddened scaly area left inner thigh next to his scrotum, denied pain, slightly itching noted. Moist and heat are contributory, apply 0.5% Triamcinolone, Nystatin cream bid to the affected area until healed.

## 2023-03-19 NOTE — Assessment & Plan Note (Signed)
GABG x2, post Op afib, takes Plavix, Metoprolol, Jardiance, and statin 

## 2023-03-19 NOTE — Assessment & Plan Note (Signed)
on Vit B12, Vit B12 877, Hgb 10.6 02/11/23 

## 2023-03-19 NOTE — Assessment & Plan Note (Signed)
on Vit D3, Vit D 51 01/02/23

## 2023-03-19 NOTE — Assessment & Plan Note (Signed)
Bp normalized, off Metoprolol.

## 2023-03-19 NOTE — Assessment & Plan Note (Signed)
The left arm pain with movement, better pain controlled on Norco 5/325 1/2 tab every day, Voltaren gel.

## 2023-03-19 NOTE — Assessment & Plan Note (Signed)
on Zetia, Fish oil, Rosuvastatin, LDL 56 02/20/23

## 2023-03-19 NOTE — Assessment & Plan Note (Signed)
blood loss, Hgb 10.4 02/11/23 

## 2023-03-19 NOTE — Assessment & Plan Note (Signed)
Heart rate is in control,  02/12/23 off Metoprolol 2/2 low Bp,  TSH 2.91 01/02/23 

## 2023-03-19 NOTE — Assessment & Plan Note (Signed)
on Gabapentin, ambulates with walker.  

## 2023-03-19 NOTE — Assessment & Plan Note (Signed)
Uses walker, unsteady, fall today w/o apparent injury

## 2023-03-19 NOTE — Assessment & Plan Note (Signed)
Hx of bladder neoplasm,  2-3x nocturnal urination, no difficulty returning asleep, on Tamsulosin.

## 2023-03-24 ENCOUNTER — Non-Acute Institutional Stay: Payer: Medicare Other | Admitting: Orthopedic Surgery

## 2023-03-24 ENCOUNTER — Encounter: Payer: Self-pay | Admitting: Orthopedic Surgery

## 2023-03-24 DIAGNOSIS — R296 Repeated falls: Secondary | ICD-10-CM

## 2023-03-24 DIAGNOSIS — L03115 Cellulitis of right lower limb: Secondary | ICD-10-CM

## 2023-03-24 NOTE — Progress Notes (Unsigned)
Location:   [facility] Room Number: 926-A Place of Service:  ALF [phone]) Provider:  Hazle Nordmann, NP  PCP: Tanner Brunette, MD  Patient Care Team: Tanner Brunette, MD as PCP - General (Internal Medicine) Tanner Lex, MD as PCP - Cardiology (Cardiology)  Extended Emergency Contact Information Primary Emergency Contact: [name],Tanner Mobile Phone: [phone] Relation: Nephew Preferred language: English Interpreter needed? No Secondary Emergency Contact: [name],Tanner Mobile Phone: [phone] Relation: Niece  Code Status:  DNR Goals of care: Advanced Directive information    03/24/2023    1:45 PM  Advanced Directives  Does Patient Have a Medical Advance Directive? Yes  Type of Advance Directive Out of facility DNR (pink MOST or yellow form)  Does patient want to make changes to medical advance directive? No - Patient declined     Chief Complaint  Patient presents with   Acute Visit    Right foot wound.    HPI:  Pt is a 89 y.o. male seen today for an acute visit for    Past Medical History:  Diagnosis Date   Anticoagulant long-term use    plavix  (for hx TIA)   Arthritis    right shoulder    Benign essential tremor    hands -- occasional   CAD S/P percutaneous coronary angioplasty cardiologist-- dr harding   a) PCI and BMS to LAD in 1991; b) CATH 10/2010: Severe 2 Vessel disease = diffuse RCA ( 75% prox, 90% mid & 70 + 75% distal) & LAD (80-90% pre-stent, 75% post-stent) with occluded D1 (filled via OM-D1 collaterals)  -- CABG x 2; c) Myoview 4/'15: INTERMEDIATE RISK - small sized reversible apical/anterolateral defect -- most likely c/w known occlusion of D1 with collaterals.(med Rx)   History of basal cell carcinoma (BCC) excision    History of bladder cancer urologist-- dr eskridge   dx 11/ 2015-- s/p TURBT   History of kidney stones    History of TIA (transient ischemic attack) 12/09/2013   History of urethral stricture     Hyperlipidemia with target LDL less than 70    controlled.   Neuropathy of lower extremity    Nocturia    Osteoporosis    Peripheral neuropathy    lower extremity   Postoperative atrial fibrillation (HCC) 10/2010   after bypass-"shocked back in and no problems since":   per cardiologist note, [name], no recurrence   Pre-diabetes    S/P CABG x 2 10/18/2010   LIMA-LAD, SVG to RPDA (Dr. Cornelius Moras)   TGA (transient global amnesia)    "x3 for hours then goes away"   Transient Dilated idiopathic cardiomyopathy April 2015; 04/2014   a) in setting of TIA: EF 35-40% (reduced from 55-65 percent) mild AI, moderate and dilated aorta.;; b) Recheck Echo 04/2014: EF 55-60%, Gr 1 DD, mild-mod AI.     Wears glasses    Wears partial dentures    upper   Past Surgical History:  Procedure Laterality Date   APPENDECTOMY  as teen   BASAL CELL CARCINOMA EXCISION  07/2014   CARDIAC CATHETERIZATION  10/17/2010   80%prox w/mild in-stent restenosis w/75% mid-LADstenosis aft the stent,multi severe stenoses RCAw/heavily calcified vessel,norm LV   CARDIOVERSION  2012   after bypass surgery   CATARACT EXTRACTION W/ INTRAOCULAR LENS  IMPLANT, BILATERAL  2009  approx.   COCCYX REMOVAL     CORONARY ANGIOPLASTY WITH STENT PLACEMENT  1991   PCI and BMS to LAD   CORONARY ARTERY BYPASS GRAFT  10-18-2010  [name] @[facility]    LIMA-LAD, SVG-rPDA   CYSTOSCOPY W/ RETROGRADES N/A 07/12/2014   Procedure: CYSTOSCOPY WITH RETROGRADE PYELOGRAM;  Surgeon: [name], MD;  Location: [facility];  Service: Urology;  Laterality: N/A;   CYSTOSCOPY WITH BIOPSY N/A 09/09/2014   Procedure: CYSTO WITH BIOPSY AND FULGERATION, DILATION OF URETHRAL STRICTURE;  Surgeon: [name], MD;  Location: [facility];  Service: Urology;  Laterality: N/A;  BLADDER BIOPSY   CYSTOSCOPY WITH BIOPSY N/A 01/03/2015   Procedure: CYSTOSCOPY WITH BIOPSY;  Surgeon: [name], MD;  Location: [facility];  Service: Urology;  Laterality: N/A;   CYSTOSCOPY WITH BIOPSY  N/A 01/26/2019   Procedure: CYSTOSCOPY WITH BIOPSY, BILATERAL RETROGRADE PYELOGRAM;  Surgeon: [name], MD;  Location: [facility];  Service: Urology;  Laterality: N/A;   CYSTOSCOPY WITH FULGERATION N/A 01/26/2019   Procedure: CYSTOSCOPY WITH FULGERATION;  Surgeon: [name], MD;  Location: [facility];  Service: Urology;  Laterality: N/A;   CYSTOSCOPY WITH URETHRAL DILATATION N/A 01/03/2015   Procedure: CYSTOSCOPY WITH URETHRAL DILATATION;  Surgeon: [name], MD;  Location: [facility];  Service: Urology;  Laterality: N/A;   DOPPLER ECHOCARDIOGRAPHY  10/18/2004   EF 55-65%,BORDERLINE -enlarged aortic root,mild aortic insuff.,trace tricus.insuff.   FINGER SURGERY Left 03-17-2001   dr [name]  @[facility]    repair complex laceration injury 5th digit   KIDNEY STONE SURGERY  1989   NM MYOVIEW LTD  12/16/2013   INTERMEDIATE RISK test with small sized reversible perfusion defect in the apical anterolateral wall -- most likely consistent with known occlusion of D1 with collaterals.   shoulder surgery Right 1988   TONSILLECTOMY  as teen   TRANSTHORACIC ECHOCARDIOGRAM  April 2015   EF 35-40%, mild AI, moderately dilated ascending aorta   TRANSURETHRAL RESECTION OF BLADDER TUMOR Right 06/11/2022   Procedure: TRANSURETHRAL RESECTION OF BLADDER TUMOR (TURBT)  >5cm;  Surgeon: [name], MD;  Location: [facility];  Service: Urology;  Laterality: Right;   TRANSURETHRAL RESECTION OF PROSTATE N/A 07/12/2014   Procedure: TRANSURETHRAL RESECTION OF THE PROSTATE (TURP) WITH MITOMYCIN -C;  Surgeon: [name], MD;  Location: [facility];  Service: Urology;  Laterality: N/A;   TRANSURETHRAL RESECTION OF PROSTATE N/A 06/11/2022   Procedure: TRANSURETHRAL RESECTION OF THE PROSTATE (TURP);  Surgeon: [name], MD;  Location: [facility];  Service: Urology;  Laterality: N/A;  REQUESTING 2 HRS    No Known Allergies  Allergies as of 03/24/2023   No Known Allergies       Medication List        Accurate as of March 24, 2023  1:46 PM. If you have any questions, ask your nurse or doctor.          acetaminophen 500 MG tablet Commonly known as: TYLENOL Take 1,000 mg by mouth every 6 (six) hours as needed for mild pain.   Calcium Antacid Ultra Strength 1000 MG chewable tablet Generic drug: calcium elemental as carbonate Chew 1,000 mg by mouth daily.   clopidogrel 75 MG tablet Commonly known as: PLAVIX Take 75 mg by mouth daily.   ezetimibe 10 MG tablet Commonly known as: ZETIA Take 10 mg by mouth daily.   fish oil-omega-3 fatty acids 1000 MG capsule Take 1 g by mouth daily.   gabapentin 100 MG capsule Commonly known as: NEURONTIN Take 100 mg by mouth 2 (two) times daily.   HYDROcodone-acetaminophen 5-325 MG tablet Commonly known as: Norco Take 0.5 tablets by mouth daily.   Jardiance 10 MG Tabs  tablet Generic drug: empagliflozin Take 10 mg by mouth daily.   latanoprost 0.005 % ophthalmic solution Commonly known as: XALATAN Place 1 drop into both eyes at bedtime.   metFORMIN 500 MG tablet Commonly known as: GLUCOPHAGE Take 500 mg by mouth daily.   multivitamin capsule Take 1 capsule by mouth at bedtime.   nystatin cream Commonly known as: MYCOSTATIN Apply 1 Application topically 2 (two) times daily.   Prolia 60 MG/ML Sosy injection Generic drug: denosumab Inject 60 mg into the skin every 6 (six) months.   rosuvastatin 40 MG tablet Commonly known as: CRESTOR Take 40 mg by mouth daily.   tamsulosin 0.4 MG Caps capsule Commonly known as: FLOMAX Take 0.4 mg by mouth at bedtime.   timolol 0.5 % ophthalmic solution Commonly known as: TIMOPTIC Place 1 drop into both eyes every morning.   triamcinolone cream 0.5 % Commonly known as: KENALOG Apply 1 Application topically 2 (two) times daily.   VITAMIN B12 PO Place 5,000 mcg under the tongue daily.   vitamin D3 50 MCG (2000 UT) Caps Take 2,000 Units by mouth  daily.   Voltaren 1 % Gel Generic drug: diclofenac Sodium Apply topically 4 (four) times daily.        Review of Systems  Immunization History  Administered Date(s) Administered   Influenza-Unspecified 06/02/2014   Tdap 08/25/2022   Zoster Recombinant(Shingrix) 06/18/2018, 08/28/2018   Pertinent  Health Maintenance Due  Topic Date Due   OPHTHALMOLOGY EXAM  Never done   INFLUENZA VACCINE  04/03/2023   HEMOGLOBIN A1C  08/01/2023   FOOT EXAM  02/06/2024      03/28/2019    8:25 AM 03/22/2022   11:09 AM 08/06/2022    9:18 PM 08/25/2022    6:51 PM 02/04/2023    4:10 PM  Fall Risk  Falls in the past year?     1  Was there an injury with Fall?     1  Fall Risk Category Calculator     3  (RETIRED) Patient Fall Risk Level High fall risk Moderate fall risk Low fall risk Low fall risk   Patient at Risk for Falls Due to     History of fall(s);Impaired balance/gait;Impaired mobility  Fall risk Follow up     Falls evaluation completed   Functional Status Survey:    Vitals:   03/24/23 1341  BP: 127/73  Pulse: 91  Resp: 16  Temp: 98.5 F (36.9 C)  SpO2: 96%  Weight: 150 lb 9.6 oz (68.3 kg)  Height: 5\' 9"  (1.753 m)   Body mass index is 22.24 kg/m. Physical Exam  Labs reviewed: Recent Labs    08/25/22 1848 01/29/23 1606 01/30/23 0508 02/11/23 0000  NA 137 134* 136 139  K 4.3 4.7 3.6 4.6  CL 104 102 108 107  CO2 25 22 20* 25*  GLUCOSE 171* 211* 135*  --   BUN 25* 33* 26* 18  CREATININE 0.86 0.98 0.88 1.0  CALCIUM 9.7 9.5 8.5* 8.8  MG  --   --  2.1  --   PHOS  --   --  3.4  --    Recent Labs    08/06/22 1933 01/29/23 1606 01/30/23 0508  AST 69* 30 22  ALT 66* 33 24  ALKPHOS 59 42 33*  BILITOT 1.1 1.3* 1.2  PROT 6.5 7.4 5.6*  ALBUMIN 3.6 3.9 2.9*   Recent Labs    08/25/22 1848 01/29/23 1606 01/30/23 0508 02/11/23 0000  WBC 5.2 8.7 5.9  3.8  NEUTROABS  --  7.2  --   --   HGB 13.0 13.3 10.2* 10.6*  HCT 40.8 41.1 31.1* 32*  MCV 100.2* 97.4 96.9  --    PLT 161 161 109* 192   Lab Results  Component Value Date   TSH 0.885 03/23/2019   Lab Results  Component Value Date   HGBA1C 7.8 (H) 01/29/2023   Lab Results  Component Value Date   CHOL 121 02/20/2023   HDL 43 02/20/2023   LDLCALC 56 02/20/2023   TRIG 134 02/20/2023   CHOLHDL 2.6 12/10/2013    Significant Diagnostic Results in last 30 days:  No results found.  Assessment/Plan There are no diagnoses linked to this encounter.   Family/ staff Communication:   Labs/tests ordered:

## 2023-03-25 ENCOUNTER — Encounter: Payer: Self-pay | Admitting: Nurse Practitioner

## 2023-03-25 ENCOUNTER — Non-Acute Institutional Stay: Payer: Medicare Other | Admitting: Nurse Practitioner

## 2023-03-25 DIAGNOSIS — D500 Iron deficiency anemia secondary to blood loss (chronic): Secondary | ICD-10-CM | POA: Diagnosis not present

## 2023-03-25 DIAGNOSIS — E785 Hyperlipidemia, unspecified: Secondary | ICD-10-CM

## 2023-03-25 DIAGNOSIS — I2510 Atherosclerotic heart disease of native coronary artery without angina pectoris: Secondary | ICD-10-CM

## 2023-03-25 DIAGNOSIS — R350 Frequency of micturition: Secondary | ICD-10-CM | POA: Diagnosis not present

## 2023-03-25 DIAGNOSIS — E11621 Type 2 diabetes mellitus with foot ulcer: Secondary | ICD-10-CM

## 2023-03-25 DIAGNOSIS — E1165 Type 2 diabetes mellitus with hyperglycemia: Secondary | ICD-10-CM

## 2023-03-25 DIAGNOSIS — I479 Paroxysmal tachycardia, unspecified: Secondary | ICD-10-CM

## 2023-03-25 DIAGNOSIS — L97419 Non-pressure chronic ulcer of right heel and midfoot with unspecified severity: Secondary | ICD-10-CM

## 2023-03-25 DIAGNOSIS — G609 Hereditary and idiopathic neuropathy, unspecified: Secondary | ICD-10-CM

## 2023-03-25 MED ORDER — DOXYCYCLINE HYCLATE 100 MG PO TABS
100.0000 mg | ORAL_TABLET | Freq: Two times a day (BID) | ORAL | Status: AC
Start: 2023-03-24 — End: 2023-04-03

## 2023-03-25 NOTE — Assessment & Plan Note (Signed)
on Gabapentin, ambulates with walker.  

## 2023-03-25 NOTE — Assessment & Plan Note (Signed)
Heart rate is in control,  02/12/23 off Metoprolol 2/2 low Bp,  TSH 2.91 01/02/23 

## 2023-03-25 NOTE — Assessment & Plan Note (Signed)
blood loss, Hgb 10.4 02/11/23 

## 2023-03-25 NOTE — Assessment & Plan Note (Signed)
on Zetia, Fish oil, Rosuvastatin, LDL 56 02/20/23

## 2023-03-25 NOTE — Assessment & Plan Note (Signed)
GABG x2, post Op afib, takes Plavix, Metoprolol, Jardiance, and statin 

## 2023-03-25 NOTE — Assessment & Plan Note (Signed)
Hgb A1c 8.2 01/02/23>>7.8 01/29/23 since started Meformin, Jardiance, Bun/creat 18/0.97 02/11/23 

## 2023-03-25 NOTE — Progress Notes (Addendum)
Location:   [facility] Room Number: 926 A Place of Service:  AL[facility] (13) Provider: Arna Snipe Vennie Salsbury NP  [name], MD  Patient Care Team: [name], MD as PCP - General (Internal Medicine) [name], MD as PCP - Cardiology (Cardiology)  Extended Emergency Contact Information Primary Emergency Contact: [name],[name] Mobile Phone: [phone] Relation: Nephew Preferred language: English Interpreter needed? No Secondary Emergency Contact: wright,[name] Mobile Phone: [phone] Relation: Niece  Code Status: DNR Goals of care: Advanced Directive information    03/25/2023    2:54 PM  Advanced Directives  Does Patient Have a Medical Advance Directive? Yes  Type of Advance Directive Out of facility DNR (pink MOST or yellow form)  Does patient want to make changes to medical advance directive? No - Patient declined  Pre-existing out of facility DNR order (yellow form or pink MOST form) Yellow form placed in chart (order not valid for inpatient use)     Chief Complaint  Patient presents with   Acute Visit    Open wound of foot    HPI:  Pt is a 89 y.o. male seen today for an acute visit for the lateral right foot open wound about a dime sized, punched out appearance, gray wound bed, bordered with slightly reddened skin, no odorous drainage, skin color and temperature wnl, no dorsalis pedis pulse felt. Placed 10 day course of Doxy yesterday 03/24/23     Resolved reddened raised about a lemon sized reddened scaly area left inner thigh next to his scrotum              The left arm pain with movement, better pain controlled on Norco 5/325 1/2 tab every day, Voltaren gel.              Bp normalized, off Metoprolol.  Hematuria, resolved, Plavix was resumed by Urology 02/12/23             Hospitalized 01/29/23-02/01/23 for gross hematuria, f/u urology              Anemia, blood loss, Hgb 10.4 02/11/23 T2DM, Hgb A1c 8.2 01/02/23>>7.8 01/29/23 since started Meformin, Jardiance,  Bun/creat 18/0.97 02/11/23             Urinary frequency, Hx of bladder neoplasm,  2-3x nocturnal urination, no difficulty returning asleep, on Tamsulosin. 02/05/23 Urology resume Plavix. Cysto 3 months. BCG #1of 3(immuno therapy)             CAD, GABG x2, post Op afib, takes Plavix, Metoprolol, Jardiance, and statin             Vitamin D deficiency, on Vit D3, Vit D 51 01/02/23             Vit B12 deficiency, on Vit B12, Vit B12 877, Hgb 10.6 02/11/23             Peripheral neuropathy, on Gabapentin, ambulates with walker.  Glaucoma, wear glasses, on Timolol, Latanoprost HLD on Zetia, Fish oil, Rosuvastatin, LDL 56 02/20/23 Paroxysmal tachycardia, 02/12/23 off Metoprolol 2/2 low Bp,  TSH 2.91 01/02/23    Past Medical History:  Diagnosis Date   Anticoagulant long-term use    plavix  (for hx TIA)   Arthritis    right shoulder    Benign essential tremor    hands -- occasional   CAD S/P percutaneous coronary angioplasty cardiologist-- dr harding   a) PCI and BMS to LAD in 1991; b) CATH 10/2010: Severe 2 Vessel disease = diffuse RCA (  75% prox, 90% mid & 70 + 75% distal) & LAD (80-90% pre-stent, 75% post-stent) with occluded D1 (filled via OM-D1 collaterals)  -- CABG x 2; c) Myoview 4/'15: INTERMEDIATE RISK - small sized reversible apical/anterolateral defect -- most likely c/w known occlusion of D1 with collaterals.(med Rx)   History of basal cell carcinoma (BCC) excision    History of bladder cancer urologist-- dr eskridge   dx 11/ 2015-- s/p TURBT   History of kidney stones    History of TIA (transient ischemic attack) 12/09/2013   History of urethral stricture    Hyperlipidemia with target LDL less than 70    controlled.   Neuropathy of lower extremity    Nocturia    Osteoporosis    Peripheral neuropathy    lower extremity   Postoperative atrial fibrillation (HCC) 10/2010   after bypass-"shocked back in and no problems since":   per cardiologist note, [name], no recurrence   Pre-diabetes     S/P CABG x 2 10/18/2010   LIMA-LAD, SVG to RPDA (Dr. Cornelius Moras)   TGA (transient global amnesia)    "x3 for hours then goes away"   Transient Dilated idiopathic cardiomyopathy April 2015; 04/2014   a) in setting of TIA: EF 35-40% (reduced from 55-65 percent) mild AI, moderate and dilated aorta.;; b) Recheck Echo 04/2014: EF 55-60%, Gr 1 DD, mild-mod AI.     Wears glasses    Wears partial dentures    upper   Past Surgical History:  Procedure Laterality Date   APPENDECTOMY  as teen   BASAL CELL CARCINOMA EXCISION  07/2014   CARDIAC CATHETERIZATION  10/17/2010   80%prox w/mild in-stent restenosis w/75% mid-LADstenosis aft the stent,multi severe stenoses RCAw/heavily calcified vessel,norm LV   CARDIOVERSION  2012   after bypass surgery   CATARACT EXTRACTION W/ INTRAOCULAR LENS  IMPLANT, BILATERAL  2009  approx.   COCCYX REMOVAL     CORONARY ANGIOPLASTY WITH STENT PLACEMENT  1991   PCI and BMS to LAD   CORONARY ARTERY BYPASS GRAFT  10-18-2010  [name] @[facility]    LIMA-LAD, SVG-rPDA   CYSTOSCOPY W/ RETROGRADES N/A 07/12/2014   Procedure: CYSTOSCOPY WITH RETROGRADE PYELOGRAM;  Surgeon: [name], MD;  Location: [facility];  Service: Urology;  Laterality: N/A;   CYSTOSCOPY WITH BIOPSY N/A 09/09/2014   Procedure: CYSTO WITH BIOPSY AND FULGERATION, DILATION OF URETHRAL STRICTURE;  Surgeon: [name], MD;  Location: [facility];  Service: Urology;  Laterality: N/A;  BLADDER BIOPSY   CYSTOSCOPY WITH BIOPSY N/A 01/03/2015   Procedure: CYSTOSCOPY WITH BIOPSY;  Surgeon: [name], MD;  Location: [facility];  Service: Urology;  Laterality: N/A;   CYSTOSCOPY WITH BIOPSY N/A 01/26/2019   Procedure: CYSTOSCOPY WITH BIOPSY, BILATERAL RETROGRADE PYELOGRAM;  Surgeon: [name], MD;  Location: [facility];  Service: Urology;  Laterality: N/A;   CYSTOSCOPY WITH FULGERATION N/A 01/26/2019   Procedure: CYSTOSCOPY WITH FULGERATION;  Surgeon: [name], MD;  Location: [facility];  Service: Urology;  Laterality: N/A;   CYSTOSCOPY WITH URETHRAL DILATATION N/A 01/03/2015   Procedure: CYSTOSCOPY WITH URETHRAL DILATATION;  Surgeon: [name], MD;  Location: [facility];  Service: Urology;  Laterality: N/A;   DOPPLER ECHOCARDIOGRAPHY  10/18/2004   EF 55-65%,BORDERLINE -enlarged aortic root,mild aortic insuff.,trace tricus.insuff.   FINGER SURGERY Left 03-17-2001   dr [name]  @[facility]    repair complex laceration injury 5th digit   KIDNEY STONE SURGERY  1989   NM MYOVIEW LTD  12/16/2013   INTERMEDIATE  RISK test with small sized reversible perfusion defect in the apical anterolateral wall -- most likely consistent with known occlusion of D1 with collaterals.   shoulder surgery Right 1988   TONSILLECTOMY  as teen   TRANSTHORACIC ECHOCARDIOGRAM  April 2015   EF 35-40%, mild AI, moderately dilated ascending aorta   TRANSURETHRAL RESECTION OF BLADDER TUMOR Right 06/11/2022   Procedure: TRANSURETHRAL RESECTION OF BLADDER TUMOR (TURBT)  >5cm;  Surgeon: [name], MD;  Location: [facility];  Service: Urology;  Laterality: Right;   TRANSURETHRAL RESECTION OF PROSTATE N/A 07/12/2014   Procedure: TRANSURETHRAL RESECTION OF THE PROSTATE (TURP) WITH MITOMYCIN -C;  Surgeon: [name], MD;  Location: [facility];  Service: Urology;  Laterality: N/A;   TRANSURETHRAL RESECTION OF PROSTATE N/A 06/11/2022   Procedure: TRANSURETHRAL RESECTION OF THE PROSTATE (TURP);  Surgeon: [name], MD;  Location: [facility];  Service: Urology;  Laterality: N/A;  REQUESTING 2 HRS    No Known Allergies  Allergies as of 03/25/2023   No Known Allergies      Medication List        Accurate as of March 25, 2023 11:59 PM. If you have any questions, ask your nurse or doctor.          acetaminophen 500 MG tablet Commonly known as: TYLENOL Take 1,000 mg by mouth every 6 (six) hours as needed for mild pain.   Calcium Antacid Ultra Strength 1000 MG chewable tablet Generic drug:  calcium elemental as carbonate Chew 1,000 mg by mouth daily.   clopidogrel 75 MG tablet Commonly known as: PLAVIX Take 75 mg by mouth daily.   doxycycline 100 MG tablet Commonly known as: VIBRA-TABS Take 1 tablet (100 mg total) by mouth 2 (two) times daily for 10 days.   ezetimibe 10 MG tablet Commonly known as: ZETIA Take 10 mg by mouth daily.   fish oil-omega-3 fatty acids 1000 MG capsule Take 1 g by mouth daily.   gabapentin 100 MG capsule Commonly known as: NEURONTIN Take 100 mg by mouth 2 (two) times daily.   HYDROcodone-acetaminophen 5-325 MG tablet Commonly known as: Norco Take 0.5 tablets by mouth daily.   Jardiance 10 MG Tabs tablet Generic drug: empagliflozin Take 10 mg by mouth daily.   latanoprost 0.005 % ophthalmic solution Commonly known as: XALATAN Place 1 drop into both eyes at bedtime.   metFORMIN 500 MG tablet Commonly known as: GLUCOPHAGE Take 500 mg by mouth daily.   multivitamin capsule Take 1 capsule by mouth at bedtime.   mupirocin ointment 2 % Commonly known as: BACTROBAN 1 Application daily. Apply to Right lateral foot wound topically every evening shift for right lateral foot wound Clean with wound cleanser daily, dry, apply Mupirocin ointment and cover with Mepilex   nystatin cream Commonly known as: MYCOSTATIN Apply 1 Application topically 2 (two) times daily.   Prolia 60 MG/ML Sosy injection Generic drug: denosumab Inject 60 mg into the skin every 6 (six) months.   rosuvastatin 40 MG tablet Commonly known as: CRESTOR Take 40 mg by mouth daily.   tamsulosin 0.4 MG Caps capsule Commonly known as: FLOMAX Take 0.4 mg by mouth at bedtime.   timolol 0.5 % ophthalmic solution Commonly known as: TIMOPTIC Place 1 drop into both eyes every morning.   triamcinolone cream 0.5 % Commonly known as: KENALOG Apply 1 Application topically 2 (two) times daily.   VITAMIN B12 PO Place 5,000 mcg under the tongue daily.   vitamin D3 50  MCG (2000 UT) Caps  Take 2,000 Units by mouth daily.   Voltaren 1 % Gel Generic drug: diclofenac Sodium Apply topically 4 (four) times daily.        Review of Systems  Constitutional:  Negative for appetite change, fatigue and fever.  HENT:  Positive for hearing loss. Negative for congestion and trouble swallowing.   Eyes:  Positive for visual disturbance.       Low vision  Respiratory:  Negative for cough and wheezing.   Cardiovascular:  Positive for leg swelling.  Gastrointestinal:  Negative for abdominal pain and constipation.  Genitourinary:  Positive for frequency. Negative for dysuria and hematuria.  Musculoskeletal:  Positive for arthralgias and gait problem.  Skin:  Positive for wound. Negative for rash.  Neurological:  Positive for numbness. Negative for weakness and headaches.       Hx of numbness in legs.   Psychiatric/Behavioral:  Positive for self-injury. Negative for behavioral problems and sleep disturbance. The patient is not nervous/anxious.     Immunization History  Administered Date(s) Administered   Influenza-Unspecified 06/02/2014   Tdap 08/25/2022   Zoster Recombinant(Shingrix) 06/18/2018, 08/28/2018   Pertinent  Health Maintenance Due  Topic Date Due   OPHTHALMOLOGY EXAM  Never done   INFLUENZA VACCINE  04/03/2023   HEMOGLOBIN A1C  08/01/2023   FOOT EXAM  02/06/2024      03/28/2019    8:25 AM 03/22/2022   11:09 AM 08/06/2022    9:18 PM 08/25/2022    6:51 PM 02/04/2023    4:10 PM  Fall Risk  Falls in the past year?     1  Was there an injury with Fall?     1  Fall Risk Category Calculator     3  (RETIRED) Patient Fall Risk Level High fall risk Moderate fall risk Low fall risk Low fall risk   Patient at Risk for Falls Due to     History of fall(s);Impaired balance/gait;Impaired mobility  Fall risk Follow up     Falls evaluation completed   Functional Status Survey:    Vitals:   03/25/23 1445  BP: 127/73  Pulse: 91  Resp: 16  Temp: 98.5 F  (36.9 C)  SpO2: 96%  Weight: 150 lb 9.6 oz (68.3 kg)  Height: 5\' 9"  (1.753 m)   Body mass index is 22.24 kg/m. Physical Exam Vitals and nursing note reviewed.  Constitutional:      Appearance: Normal appearance.  HENT:     Head: Normocephalic and atraumatic.     Nose: Nose normal.     Mouth/Throat:     Mouth: Mucous membranes are moist.     Comments:   Eyes:     Extraocular Movements: Extraocular movements intact.     Conjunctiva/sclera: Conjunctivae normal.     Pupils: Pupils are equal, round, and reactive to light.  Cardiovascular:     Rate and Rhythm: Normal rate and regular rhythm.     Heart sounds: No murmur heard. Pulmonary:     Effort: Pulmonary effort is normal.     Breath sounds: No rales.  Abdominal:     General: Bowel sounds are normal.     Palpations: Abdomen is soft.     Tenderness: There is no abdominal tenderness.  Musculoskeletal:        General: Tenderness present.     Cervical back: Normal range of motion and neck supple.     Right lower leg: Edema present.     Left lower leg: Edema present.  Comments: Trace edema in ankles. Left arm below the shoulder/acromial deltoid muscle pain with movement and palpated, but no decreased ROM or deformity, better pain controlled.   Skin:    General: Skin is warm and dry.     Findings: No rash.     Comments:  the right buttock pressure ulcer, about a pea sized, no s/s of infection, blanchable redness R+L buttocks Resolved reddened raised about a lemon sized reddened scaly area left inner thigh next to his scrotum the lateral right foot open wound about a dime sized, punched out appearance, gray wound bed, bordered with slightly reddened skin, no odorous drainage, skin color and temperature wnl, no dorsalis pedis pulse felt.    Neurological:     General: No focal deficit present.     Mental Status: He is alert and oriented to person, place, and time. Mental status is at baseline.     Gait: Gait abnormal.   Psychiatric:        Mood and Affect: Mood normal.        Behavior: Behavior normal.        Thought Content: Thought content normal.        Judgment: Judgment normal.     Labs reviewed: Recent Labs    08/25/22 1848 01/29/23 1606 01/30/23 0508 02/11/23 0000  NA 137 134* 136 139  K 4.3 4.7 3.6 4.6  CL 104 102 108 107  CO2 25 22 20* 25*  GLUCOSE 171* 211* 135*  --   BUN 25* 33* 26* 18  CREATININE 0.86 0.98 0.88 1.0  CALCIUM 9.7 9.5 8.5* 8.8  MG  --   --  2.1  --   PHOS  --   --  3.4  --    Recent Labs    08/06/22 1933 01/29/23 1606 01/30/23 0508  AST 69* 30 22  ALT 66* 33 24  ALKPHOS 59 42 33*  BILITOT 1.1 1.3* 1.2  PROT 6.5 7.4 5.6*  ALBUMIN 3.6 3.9 2.9*   Recent Labs    08/25/22 1848 01/29/23 1606 01/30/23 0508 02/11/23 0000  WBC 5.2 8.7 5.9 3.8  NEUTROABS  --  7.2  --   --   HGB 13.0 13.3 10.2* 10.6*  HCT 40.8 41.1 31.1* 32*  MCV 100.2* 97.4 96.9  --   PLT 161 161 109* 192   Lab Results  Component Value Date   TSH 0.885 03/23/2019   Lab Results  Component Value Date   HGBA1C 7.8 (H) 01/29/2023   Lab Results  Component Value Date   CHOL 121 02/20/2023   HDL 43 02/20/2023   LDLCALC 56 02/20/2023   TRIG 134 02/20/2023   CHOLHDL 2.6 12/10/2013    Significant Diagnostic Results in last 30 days:  No results found.  Assessment/Plan: Diabetic foot ulcer (HCC)  the lateral right foot open wound about a dime sized, punched out appearance, gray wound bed, bordered with slightly reddened skin, no odorous drainage, skin color and temperature wnl, no dorsalis pedis pulse felt.  Will use wound cleanser to wash wound, air dry, apply Bactroban ointment, obtain ABI R+L.  Placed 10 day course of Doxy yesterday 03/24/23 03/26/23 ABI R 0.86-mild, L 1.00-normal  Blood loss anemia blood loss, Hgb 10.4 02/11/23  Type 2 diabetes mellitus (HCC)  Hgb A1c 8.2 01/02/23>>7.8 01/29/23 since started Meformin, Jardiance, Bun/creat 18/0.97 02/11/23  Urinary frequency , Hx  of bladder neoplasm,  2-3x nocturnal urination, no difficulty returning asleep, on Tamsulosin. 02/05/23 Urology resume Plavix. Cysto  3 months. BCG #1of 3(immuno therapy)  Two-vessel CAD status post CABG x2;  GABG x2, post Op afib, takes Plavix, Metoprolol, Jardiance, and statin  Hereditary and idiopathic peripheral neuropathy  on Gabapentin, ambulates with walker.   Dyslipidemia, goal LDL below 70  on Zetia, Fish oil, Rosuvastatin, LDL 56 02/20/23  Paroxysmal tachycardia (HCC) Heart rate is in control,  02/12/23 off Metoprolol 2/2 low Bp,  TSH 2.91 01/02/23    Family/ staff Communication: plan of care reviewed with the patient and charge nurse.   Labs/tests ordered:  ABI R and L  Time spend 40 minutes.

## 2023-03-25 NOTE — Assessment & Plan Note (Signed)
,   Hx of bladder neoplasm,  2-3x nocturnal urination, no difficulty returning asleep, on Tamsulosin. 02/05/23 Urology resume Plavix. Cysto 3 months. BCG #1of 3(immuno therapy)

## 2023-03-25 NOTE — Assessment & Plan Note (Addendum)
the lateral right foot open wound about a dime sized, punched out appearance, gray wound bed, bordered with slightly reddened skin, no odorous drainage, skin color and temperature wnl, no dorsalis pedis pulse felt.  Will use wound cleanser to wash wound, air dry, apply Bactroban ointment, obtain ABI R+L.  Placed 10 day course of Doxy yesterday 03/24/23 03/26/23 ABI R 0.86-mild, L 1.00-normal

## 2023-03-26 ENCOUNTER — Encounter: Payer: Self-pay | Admitting: Nurse Practitioner

## 2023-03-28 ENCOUNTER — Encounter: Payer: Self-pay | Admitting: Nurse Practitioner

## 2023-03-28 DIAGNOSIS — I739 Peripheral vascular disease, unspecified: Secondary | ICD-10-CM | POA: Insufficient documentation

## 2023-04-02 ENCOUNTER — Non-Acute Institutional Stay: Payer: Medicare Other | Admitting: Family Medicine

## 2023-04-02 DIAGNOSIS — R269 Unspecified abnormalities of gait and mobility: Secondary | ICD-10-CM

## 2023-04-02 DIAGNOSIS — I480 Paroxysmal atrial fibrillation: Secondary | ICD-10-CM | POA: Diagnosis not present

## 2023-04-02 DIAGNOSIS — D494 Neoplasm of unspecified behavior of bladder: Secondary | ICD-10-CM | POA: Diagnosis not present

## 2023-04-02 DIAGNOSIS — E785 Hyperlipidemia, unspecified: Secondary | ICD-10-CM | POA: Diagnosis not present

## 2023-04-02 NOTE — Progress Notes (Signed)
Provider:  Jacalyn Lefevre, MD Location:      Place of Service:     PCP: [name], MD Patient Care Team: [name], MD as PCP - General (Internal Medicine) [name], MD as PCP - Cardiology (Cardiology)  Extended Emergency Contact Information Primary Emergency Contact: [name],[name] Mobile Phone: [phone] Relation: Nephew Preferred language: English Interpreter needed? No Secondary Emergency Contact: wright,[name] Mobile Phone: [phone] Relation: Niece  Code Status:  Goals of Care: Advanced Directive information    03/25/2023    2:54 PM  Advanced Directives  Does Patient Have a Medical Advance Directive? Yes  Type of Advance Directive Out of facility DNR (pink MOST or yellow form)  Does patient want to make changes to medical advance directive? No - Patient declined  Pre-existing out of facility DNR order (yellow form or pink MOST form) Yellow form placed in chart (order not valid for inpatient use)      No chief complaint on file.   HPI: Patient is a 89 y.o. male seen today for admission service at [facility].  Patient lives in assisted living here at friend's home.  He has sitters, several shifts during the day.  Wife died almost 1 year ago and he moved from independent living to assisted living.  Care is largely overseen by his nephew. He has a history of bladder hernia plasm and in fact just returned from the urology office today where he has been BCG treatments. He has a history of coronary disease and CABG x 2.  Takes Plavix metoprolol Jardiance and statin.  There is also peripheral neuropathy takes gabapentin ambulates with a walker but is learning to drive a motorized scooter.  Other heart problems include paroxysmal tachycardia on metoprolol. He is diabetic controlled by diet and metformin  Past Medical History:  Diagnosis Date   Anticoagulant long-term use    plavix  (for hx TIA)   Arthritis    right shoulder    Benign  essential tremor    hands -- occasional   CAD S/P percutaneous coronary angioplasty cardiologist-- dr harding   a) PCI and BMS to LAD in 1991; b) CATH 10/2010: Severe 2 Vessel disease = diffuse RCA ( 75% prox, 90% mid & 70 + 75% distal) & LAD (80-90% pre-stent, 75% post-stent) with occluded D1 (filled via OM-D1 collaterals)  -- CABG x 2; c) Myoview 4/'15: INTERMEDIATE RISK - small sized reversible apical/anterolateral defect -- most likely c/w known occlusion of D1 with collaterals.(med Rx)   History of basal cell carcinoma (BCC) excision    History of bladder cancer urologist-- dr eskridge   dx 11/ 2015-- s/p TURBT   History of kidney stones    History of TIA (transient ischemic attack) 12/09/2013   History of urethral stricture    Hyperlipidemia with target LDL less than 70    controlled.   Neuropathy of lower extremity    Nocturia    Osteoporosis    Peripheral neuropathy    lower extremity   Postoperative atrial fibrillation (HCC) 10/2010   after bypass-"shocked back in and no problems since":   per cardiologist note, [name], no recurrence   Pre-diabetes    S/P CABG x 2 10/18/2010   LIMA-LAD, SVG to RPDA (Dr. Cornelius Moras)   TGA (transient global amnesia)    "x3 for hours then goes away"   Transient Dilated idiopathic cardiomyopathy April 2015; 04/2014   a) in setting of TIA: EF 35-40% (reduced from 55-65 percent) mild AI, moderate and dilated  aorta.;; b) Recheck Echo 04/2014: EF 55-60%, Gr 1 DD, mild-mod AI.     Wears glasses    Wears partial dentures    upper   Past Surgical History:  Procedure Laterality Date   APPENDECTOMY  as teen   BASAL CELL CARCINOMA EXCISION  07/2014   CARDIAC CATHETERIZATION  10/17/2010   80%prox w/mild in-stent restenosis w/75% mid-LADstenosis aft the stent,multi severe stenoses RCAw/heavily calcified vessel,norm LV   CARDIOVERSION  2012   after bypass surgery   CATARACT EXTRACTION W/ INTRAOCULAR LENS  IMPLANT, BILATERAL  2009  approx.   COCCYX REMOVAL      CORONARY ANGIOPLASTY WITH STENT PLACEMENT  1991   PCI and BMS to LAD   CORONARY ARTERY BYPASS GRAFT  10-18-2010  [name] @[facility]    LIMA-LAD, SVG-rPDA   CYSTOSCOPY W/ RETROGRADES N/A 07/12/2014   Procedure: CYSTOSCOPY WITH RETROGRADE PYELOGRAM;  Surgeon: [name], MD;  Location: [facility];  Service: Urology;  Laterality: N/A;   CYSTOSCOPY WITH BIOPSY N/A 09/09/2014   Procedure: CYSTO WITH BIOPSY AND FULGERATION, DILATION OF URETHRAL STRICTURE;  Surgeon: [name], MD;  Location: [facility];  Service: Urology;  Laterality: N/A;  BLADDER BIOPSY   CYSTOSCOPY WITH BIOPSY N/A 01/03/2015   Procedure: CYSTOSCOPY WITH BIOPSY;  Surgeon: [name], MD;  Location: [facility];  Service: Urology;  Laterality: N/A;   CYSTOSCOPY WITH BIOPSY N/A 01/26/2019   Procedure: CYSTOSCOPY WITH BIOPSY, BILATERAL RETROGRADE PYELOGRAM;  Surgeon: [name], MD;  Location: [facility];  Service: Urology;  Laterality: N/A;   CYSTOSCOPY WITH FULGERATION N/A 01/26/2019   Procedure: CYSTOSCOPY WITH FULGERATION;  Surgeon: [name], MD;  Location: [facility];  Service: Urology;  Laterality: N/A;   CYSTOSCOPY WITH URETHRAL DILATATION N/A 01/03/2015   Procedure: CYSTOSCOPY WITH URETHRAL DILATATION;  Surgeon: [name], MD;  Location: [facility];  Service: Urology;  Laterality: N/A;   DOPPLER ECHOCARDIOGRAPHY  10/18/2004   EF 55-65%,BORDERLINE -enlarged aortic root,mild aortic insuff.,trace tricus.insuff.   FINGER SURGERY Left 03-17-2001   dr [name]  @[facility]    repair complex laceration injury 5th digit   KIDNEY STONE SURGERY  1989   NM MYOVIEW LTD  12/16/2013   INTERMEDIATE RISK test with small sized reversible perfusion defect in the apical anterolateral wall -- most likely consistent with known occlusion of D1 with collaterals.   shoulder surgery Right 1988   TONSILLECTOMY  as teen   TRANSTHORACIC ECHOCARDIOGRAM  April 2015   EF 35-40%, mild AI, moderately dilated ascending  aorta   TRANSURETHRAL RESECTION OF BLADDER TUMOR Right 06/11/2022   Procedure: TRANSURETHRAL RESECTION OF BLADDER TUMOR (TURBT)  >5cm;  Surgeon: [name], MD;  Location: [facility];  Service: Urology;  Laterality: Right;   TRANSURETHRAL RESECTION OF PROSTATE N/A 07/12/2014   Procedure: TRANSURETHRAL RESECTION OF THE PROSTATE (TURP) WITH MITOMYCIN -C;  Surgeon: [name], MD;  Location: [facility];  Service: Urology;  Laterality: N/A;   TRANSURETHRAL RESECTION OF PROSTATE N/A 06/11/2022   Procedure: TRANSURETHRAL RESECTION OF THE PROSTATE (TURP);  Surgeon: [name], MD;  Location: [facility];  Service: Urology;  Laterality: N/A;  REQUESTING 2 HRS    reports that he quit smoking about 65 years ago. His smoking use included cigarettes. He started smoking about 70 years ago. He has a 5 pack-year smoking history. He has never used smokeless tobacco. He reports that he does not drink alcohol and does not use drugs. Social History   Socioeconomic History   Marital status: Married  Spouse name: Mary   Number of children: 0   Years of education: BS, MS   Highest education level: Not on file  Occupational History   Occupation: Retired  Tobacco Use   Smoking status: Former    Current packs/day: 0.00    Average packs/day: 1 pack/day for 5.0 years (5.0 ttl pk-yrs)    Types: Cigarettes    Start date: 09/02/1952    Quit date: 09/02/1957    Years since quitting: 65.6   Smokeless tobacco: Never  Vaping Use   Vaping status: Never Used  Substance and Sexual Activity   Alcohol use: Never    Alcohol/week: 0.0 standard drinks of alcohol   Drug use: Never   Sexual activity: Not Currently  Other Topics Concern   Not on file  Social History Narrative   Pt lives at home with spouse.   Caffeine Use: very little      He stopped doing the [employer] building about 6 months ago, indicating that he just has not had the same out of energy.  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  He says that now he can only do about 2 and half hours worth of work before you stop and rest.   Social Determinants of Health   Financial Resource Strain: Not on file  Food Insecurity: Not on file  Transportation Needs: Not on file  Physical Activity: Not on file  Stress: Not on file  Social Connections: Not on file  Intimate Partner Violence: Not on file    Functional Status Survey:    Family History  Problem Relation Age of Onset   Heart Problems Mother    Parkinson's disease Father    Heart Problems Father     Health Maintenance  Topic Date Due   COVID-19 Vaccine (1) Never done   OPHTHALMOLOGY EXAM  Never done   Pneumonia Vaccine 31+ Years old (1 of 1 - PCV) Never done   INFLUENZA VACCINE  04/03/2023   HEMOGLOBIN A1C  08/01/2023   FOOT EXAM  02/06/2024   Medicare Annual Wellness (AWV)  02/06/2024   DTaP/Tdap/Td (2 - Td or Tdap) 08/25/2032   Zoster Vaccines- Shingrix  Completed   HPV VACCINES  Aged Out    No Known Allergies  Outpatient Encounter Medications as of 04/02/2023  Medication Sig   acetaminophen (TYLENOL) 500 MG tablet Take 1,000 mg by mouth every 6 (six) hours as needed for mild pain.   calcium elemental as carbonate (CALCIUM ANTACID ULTRA STRENGTH) 400 MG chewable tablet Chew 1,000 mg by mouth daily.   Cholecalciferol (VITAMIN D3) 50 MCG (2000 UT) CAPS Take 2,000 Units by mouth daily.   clopidogrel (PLAVIX) 75 MG tablet Take 75 mg by mouth daily.   Cyanocobalamin (VITAMIN B12 PO) Place 5,000 mcg under the tongue daily.   denosumab (PROLIA) 60 MG/ML SOSY injection Inject 60 mg into the skin every 6 (six) months.   diclofenac Sodium (VOLTAREN) 1 % GEL Apply topically 4 (four) times daily.   doxycycline (VIBRA-TABS) 100 MG tablet Take 1 tablet (100 mg total) by mouth 2 (two) times daily for 10 days.   ezetimibe (ZETIA) 10 MG tablet Take 10 mg by mouth daily.   fish oil-omega-3 fatty acids 1000 MG capsule Take 1 g by mouth daily.     gabapentin (NEURONTIN) 100 MG capsule Take 100 mg by mouth 2 (two) times daily.   HYDROcodone-acetaminophen (NORCO) 5-325 MG tablet Take 0.5 tablets by mouth  daily.   JARDIANCE 10 MG TABS tablet Take 10 mg by mouth daily.   latanoprost (XALATAN) 0.005 % ophthalmic solution Place 1 drop into both eyes at bedtime.   metFORMIN (GLUCOPHAGE) 500 MG tablet Take 500 mg by mouth daily.   Multiple Vitamin (MULTIVITAMIN) capsule Take 1 capsule by mouth at bedtime.    mupirocin ointment (BACTROBAN) 2 % 1 Application daily. Apply to Right lateral foot wound topically every evening shift for right lateral foot wound Clean with wound cleanser daily, dry, apply Mupirocin ointment and cover with Mepilex   nystatin cream (MYCOSTATIN) Apply 1 Application topically 2 (two) times daily.   rosuvastatin (CRESTOR) 40 MG tablet Take 40 mg by mouth daily.   tamsulosin (FLOMAX) 0.4 MG CAPS capsule Take 0.4 mg by mouth at bedtime.   timolol (TIMOPTIC) 0.5 % ophthalmic solution Place 1 drop into both eyes every morning.   triamcinolone cream (KENALOG) 0.5 % Apply 1 Application topically 2 (two) times daily.   Facility-Administered Encounter Medications as of 04/02/2023  Medication   gemcitabine (GEMZAR) chemo syringe for bladder instillation 2,000 mg    Review of Systems  Constitutional: Negative.   HENT:  Positive for hearing loss.   Respiratory: Negative.    Cardiovascular: Negative.   Gastrointestinal: Negative.   Endocrine: Positive for polyuria.  Psychiatric/Behavioral: Negative.    All other systems reviewed and are negative.   There were no vitals filed for this visit. There is no height or weight on file to calculate BMI. Physical Exam Vitals and nursing note reviewed.  Constitutional:      Appearance: Normal appearance.  HENT:     Nose: Nose normal.     Mouth/Throat:     Mouth: Mucous membranes are moist.  Eyes:     Extraocular Movements: Extraocular movements intact.  Cardiovascular:      Rate and Rhythm: Normal rate and regular rhythm.  Pulmonary:     Effort: Pulmonary effort is normal.     Breath sounds: Normal breath sounds.  Abdominal:     General: Bowel sounds are normal.     Palpations: Abdomen is soft.  Neurological:     General: No focal deficit present.     Mental Status: He is alert and oriented to person, place, and time.  Psychiatric:        Mood and Affect: Mood normal.        Behavior: Behavior normal.     Labs reviewed: Basic Metabolic Panel: Recent Labs    08/25/22 1848 01/29/23 1606 01/30/23 0508 02/11/23 0000  NA 137 134* 136 139  K 4.3 4.7 3.6 4.6  CL 104 102 108 107  CO2 25 22 20* 25*  GLUCOSE 171* 211* 135*  --   BUN 25* 33* 26* 18  CREATININE 0.86 0.98 0.88 1.0  CALCIUM 9.7 9.5 8.5* 8.8  MG  --   --  2.1  --   PHOS  --   --  3.4  --    Liver Function Tests: Recent Labs    08/06/22 1933 01/29/23 1606 01/30/23 0508  AST 69* 30 22  ALT 66* 33 24  ALKPHOS 59 42 33*  BILITOT 1.1 1.3* 1.2  PROT 6.5 7.4 5.6*  ALBUMIN 3.6 3.9 2.9*   No results for input(s): "LIPASE", "AMYLASE" in the last 8760 hours. No results for input(s): "AMMONIA" in the last 8760 hours. CBC: Recent Labs    08/25/22 1848 01/29/23 1606 01/30/23 0508 02/11/23 0000  WBC 5.2 8.7 5.9 3.8  NEUTROABS  --  7.2  --   --   HGB 13.0 13.3 10.2* 10.6*  HCT 40.8 41.1 31.1* 32*  MCV 100.2* 97.4 96.9  --   PLT 161 161 109* 192   Cardiac Enzymes: No results for input(s): "CKTOTAL", "CKMB", "CKMBINDEX", "TROPONINI" in the last 8760 hours. BNP: Invalid input(s): "POCBNP" Lab Results  Component Value Date   HGBA1C 7.8 (H) 01/29/2023   Lab Results  Component Value Date   TSH 0.885 03/23/2019   Lab Results  Component Value Date   VITAMINB12 591 03/11/2013   Lab Results  Component Value Date   FOLATE >19.9 03/11/2013   Lab Results  Component Value Date   FERRITIN 283 03/23/2019    Imaging and Procedures obtained prior to SNF admission: No results  found.  Assessment/Plan 1. Paroxysmal atrial fibrillation (HCC) Patient seems to be in sinus rhythm today with regular beats.  2. Bladder neoplasm Per his nephew this is fourth recurrence.  Being treated with BCG infusion  3. Dyslipidemia, goal LDL below 70 LDL at 56 checked earlier this summer  4. Gait abnormality Has neuropathy and history of falls uses a walker usually but tires easily and uses wheelchair for distance also learning to drive a motorized scooter    Family/ staff Communication:   Labs/tests ordered:  Bertram Millard. Hyacinth Meeker, MD [facility] [address] [place], [place] [address] Office [phone]

## 2023-04-04 NOTE — Addendum Note (Signed)
Addended by: Frederica Kuster on: 04/04/2023 11:26 AM   Modules accepted: Level of Service

## 2023-04-11 ENCOUNTER — Encounter: Payer: Self-pay | Admitting: Family Medicine

## 2023-04-14 ENCOUNTER — Encounter: Payer: Self-pay | Admitting: Family Medicine

## 2023-05-14 ENCOUNTER — Other Ambulatory Visit: Payer: Self-pay | Admitting: Adult Health

## 2023-05-14 DIAGNOSIS — M159 Polyosteoarthritis, unspecified: Secondary | ICD-10-CM

## 2023-05-14 MED ORDER — HYDROCODONE-ACETAMINOPHEN 5-325 MG PO TABS
0.5000 | ORAL_TABLET | Freq: Every day | ORAL | 0 refills | Status: DC
Start: 2023-05-14 — End: 2024-06-10

## 2023-06-03 ENCOUNTER — Non-Acute Institutional Stay: Payer: Medicare Other | Admitting: Nurse Practitioner

## 2023-06-03 ENCOUNTER — Encounter: Payer: Self-pay | Admitting: Nurse Practitioner

## 2023-06-03 DIAGNOSIS — I479 Paroxysmal tachycardia, unspecified: Secondary | ICD-10-CM

## 2023-06-03 DIAGNOSIS — I2510 Atherosclerotic heart disease of native coronary artery without angina pectoris: Secondary | ICD-10-CM

## 2023-06-03 DIAGNOSIS — L97419 Non-pressure chronic ulcer of right heel and midfoot with unspecified severity: Secondary | ICD-10-CM

## 2023-06-03 DIAGNOSIS — E1165 Type 2 diabetes mellitus with hyperglycemia: Secondary | ICD-10-CM | POA: Diagnosis not present

## 2023-06-03 DIAGNOSIS — I959 Hypotension, unspecified: Secondary | ICD-10-CM

## 2023-06-03 DIAGNOSIS — R350 Frequency of micturition: Secondary | ICD-10-CM | POA: Diagnosis not present

## 2023-06-03 DIAGNOSIS — E559 Vitamin D deficiency, unspecified: Secondary | ICD-10-CM

## 2023-06-03 DIAGNOSIS — E538 Deficiency of other specified B group vitamins: Secondary | ICD-10-CM

## 2023-06-03 DIAGNOSIS — D500 Iron deficiency anemia secondary to blood loss (chronic): Secondary | ICD-10-CM | POA: Diagnosis not present

## 2023-06-03 DIAGNOSIS — G609 Hereditary and idiopathic neuropathy, unspecified: Secondary | ICD-10-CM

## 2023-06-03 DIAGNOSIS — E785 Hyperlipidemia, unspecified: Secondary | ICD-10-CM

## 2023-06-03 DIAGNOSIS — E11621 Type 2 diabetes mellitus with foot ulcer: Secondary | ICD-10-CM

## 2023-06-03 NOTE — Assessment & Plan Note (Signed)
on Zetia, Fish oil, Rosuvastatin, LDL 56 02/20/23

## 2023-06-03 NOTE — Assessment & Plan Note (Signed)
Hx of bladder neoplasm,  2-3x nocturnal urination, no difficulty returning asleep, on Tamsulosin. 02/05/23 Urology resume Plavix. Cysto 3 months. BCG #1of 3(immuno therapy)

## 2023-06-03 NOTE — Progress Notes (Unsigned)
Location:  [facility] Room Number: 926-A Place of Service:  ALF [phone]) Provider:  Skylor Hughson X, NP    Patient Care Team: [name], MD as PCP - General (Internal Medicine) [name], MD as PCP - Cardiology (Cardiology)  Extended Emergency Contact Information Primary Emergency Contact: [name],[name] Mobile Phone: [phone] Relation: Nephew Preferred language: English Interpreter needed? No Secondary Emergency Contact: wright,[name] Mobile Phone: [phone] Relation: Niece  Code Status:  DNR Goals of care: Advanced Directive information    06/03/2023    2:26 PM  Advanced Directives  Does Patient Have a Medical Advance Directive? Yes  Type of Advance Directive Out of facility DNR (pink MOST or yellow form)  Does patient want to make changes to medical advance directive? No - Patient declined  Pre-existing out of facility DNR order (yellow form or pink MOST form) Yellow form placed in chart (order not valid for inpatient use)     Chief Complaint  Patient presents with  . Medical Management of Chronic Issues    Routine Visit   . Immunizations    Influenza and Covid  . Quality Metric Gaps    Eye exam    HPI:  Tanner [name] is a 89 y.o. male seen today for medical management of chronic diseases.    The lateral right foot open wound about a dime sized, punched out appearance, gray wound bed, healing slowly                        Bp controlled, off Metoprolol.  Hematuria, resolved, Plavix was resumed by Urology 02/12/23             Anemia, blood loss, Hgb 10.4 02/11/23 T2DM, Hgb A1c 8.2 01/02/23>>7.8 01/29/23 since started Meformin, Jardiance, Bun/creat 18/0.97 02/11/23             Urinary frequency, Hx of bladder neoplasm,  2-3x nocturnal urination, no difficulty returning asleep, on Tamsulosin. 02/05/23 Urology resume Plavix. Cysto 3 months. BCG #1of 3(immuno therapy)             CAD, GABG x2, post Op afib, takes Plavix, Metoprolol, Jardiance, and statin              Vitamin D deficiency, on Vit D3, Vit D 51 01/02/23             Vit B12 deficiency, on Vit B12, Vit B12 877, Hgb 10.6 02/11/23             Peripheral neuropathy, on Gabapentin, ambulates with walker.  Glaucoma, wear glasses, on Timolol, Latanoprost HLD on Zetia, Fish oil, Rosuvastatin, LDL 56 02/20/23 Paroxysmal tachycardia, 02/12/23 off Metoprolol 2/2 low Bp,  TSH 2.91 01/02/23     Past Medical History:  Diagnosis Date  . Anticoagulant long-term use    plavix  (for hx TIA)  . Arthritis    right shoulder   . Benign essential tremor    hands -- occasional  . CAD S/P percutaneous coronary angioplasty cardiologist-- dr harding   a) PCI and BMS to LAD in 1991; b) CATH 10/2010: Severe 2 Vessel disease = diffuse RCA ( 75% prox, 90% mid & 70 + 75% distal) & LAD (80-90% pre-stent, 75% post-stent) with occluded D1 (filled via OM-D1 collaterals)  -- CABG x 2; c) Myoview 4/'15: INTERMEDIATE RISK - small sized reversible apical/anterolateral defect -- most likely c/w known occlusion of D1 with collaterals.(med Rx)  . History of basal cell carcinoma (BCC) excision   .  History of bladder cancer urologist-- dr eskridge   dx 11/ 2015-- s/p TURBT  . History of kidney stones   . History of TIA (transient ischemic attack) 12/09/2013  . History of urethral stricture   . Hyperlipidemia with target LDL less than 70    controlled.  . Neuropathy of lower extremity   . Nocturia   . Osteoporosis   . Peripheral neuropathy    lower extremity  . Postoperative atrial fibrillation (HCC) 10/2010   after bypass-"shocked back in and no problems since":   per cardiologist note, dr harding, no recurrence  . Pre-diabetes   . S/P CABG x 2 10/18/2010   LIMA-LAD, SVG to RPDA (Dr. Cornelius Moras)  . TGA (transient global amnesia)    "x3 for hours then goes away"  . Transient Dilated idiopathic cardiomyopathy April 2015; 04/2014   a) in setting of TIA: EF 35-40% (reduced from 55-65 percent) mild AI, moderate and dilated  aorta.;; b) Recheck Echo 04/2014: EF 55-60%, Gr 1 DD, mild-mod AI.    [name] Kitchen Wears glasses   . Wears partial dentures    upper   Past Surgical History:  Procedure Laterality Date  . APPENDECTOMY  as teen  . BASAL CELL CARCINOMA EXCISION  07/2014  . CARDIAC CATHETERIZATION  10/17/2010   80%prox w/mild in-stent restenosis w/75% mid-LADstenosis aft the stent,multi severe stenoses RCAw/heavily calcified vessel,norm LV  . CARDIOVERSION  2012   after bypass surgery  . CATARACT EXTRACTION W/ INTRAOCULAR LENS  IMPLANT, BILATERAL  2009  approx.  . COCCYX REMOVAL    . CORONARY ANGIOPLASTY WITH STENT PLACEMENT  1991   PCI and BMS to LAD  . CORONARY ARTERY BYPASS GRAFT  10-18-2010  [name] @[facility]    LIMA-LAD, SVG-rPDA  . CYSTOSCOPY W/ RETROGRADES N/A 07/12/2014   Procedure: CYSTOSCOPY WITH RETROGRADE PYELOGRAM;  Surgeon: [name], MD;  Location: [facility];  Service: Urology;  Laterality: N/A;  . CYSTOSCOPY WITH BIOPSY N/A 09/09/2014   Procedure: CYSTO WITH BIOPSY AND FULGERATION, DILATION OF URETHRAL STRICTURE;  Surgeon: [name], MD;  Location: [facility];  Service: Urology;  Laterality: N/A;  BLADDER BIOPSY  . CYSTOSCOPY WITH BIOPSY N/A 01/03/2015   Procedure: CYSTOSCOPY WITH BIOPSY;  Surgeon: [name], MD;  Location: [facility];  Service: Urology;  Laterality: N/A;  . CYSTOSCOPY WITH BIOPSY N/A 01/26/2019   Procedure: CYSTOSCOPY WITH BIOPSY, BILATERAL RETROGRADE PYELOGRAM;  Surgeon: [name], MD;  Location: [facility];  Service: Urology;  Laterality: N/A;  . CYSTOSCOPY WITH FULGERATION N/A 01/26/2019   Procedure: CYSTOSCOPY WITH FULGERATION;  Surgeon: [name], MD;  Location: [facility];  Service: Urology;  Laterality: N/A;  . CYSTOSCOPY WITH URETHRAL DILATATION N/A 01/03/2015   Procedure: CYSTOSCOPY WITH URETHRAL DILATATION;  Surgeon: [name], MD;  Location: [facility];  Service: Urology;  Laterality: N/A;  . DOPPLER ECHOCARDIOGRAPHY   10/18/2004   EF 55-65%,BORDERLINE -enlarged aortic root,mild aortic insuff.,trace tricus.insuff.  [name] Kitchen FINGER SURGERY Left 03-17-2001   dr [name]  @[facility]    repair complex laceration injury 5th digit  . KIDNEY STONE SURGERY  1989  . NM MYOVIEW LTD  12/16/2013   INTERMEDIATE RISK test with small sized reversible perfusion defect in the apical anterolateral wall -- most likely consistent with known occlusion of D1 with collaterals.  . shoulder surgery Right 1988  . TONSILLECTOMY  as teen  . TRANSTHORACIC ECHOCARDIOGRAM  April 2015   EF 35-40%, mild AI, moderately dilated ascending aorta  . TRANSURETHRAL RESECTION OF BLADDER TUMOR Right  06/11/2022   Procedure: TRANSURETHRAL RESECTION OF BLADDER TUMOR (TURBT)  >5cm;  Surgeon: [name], MD;  Location: [facility];  Service: Urology;  Laterality: Right;  . TRANSURETHRAL RESECTION OF PROSTATE N/A 07/12/2014   Procedure: TRANSURETHRAL RESECTION OF THE PROSTATE (TURP) WITH MITOMYCIN -C;  Surgeon: [name], MD;  Location: [facility];  Service: Urology;  Laterality: N/A;  . TRANSURETHRAL RESECTION OF PROSTATE N/A 06/11/2022   Procedure: TRANSURETHRAL RESECTION OF THE PROSTATE (TURP);  Surgeon: [name], MD;  Location: [facility];  Service: Urology;  Laterality: N/A;  REQUESTING 2 HRS    No Known Allergies  Outpatient Encounter Medications as of 06/03/2023  Medication Sig  . acetaminophen (TYLENOL) 500 MG tablet Take 1,000 mg by mouth every 6 (six) hours as needed for mild pain.  . calcium elemental as carbonate (CALCIUM ANTACID ULTRA STRENGTH) 400 MG chewable tablet Chew 1,000 mg by mouth daily.  . Cholecalciferol (VITAMIN D3) 50 MCG (2000 UT) CAPS Take 2,000 Units by mouth daily.  . clopidogrel (PLAVIX) 75 MG tablet Take 75 mg by mouth daily.  . Cyanocobalamin (VITAMIN B12 PO) Place 5,000 mcg under the tongue daily.  [name] Kitchen denosumab (PROLIA) 60 MG/ML SOSY injection Inject 60 mg into the skin every 6 (six) months.  . diclofenac Sodium (VOLTAREN)  1 % GEL Apply topically 4 (four) times daily.  [name] Kitchen ezetimibe (ZETIA) 10 MG tablet Take 10 mg by mouth daily.  . fish oil-omega-3 fatty acids 1000 MG capsule Take 1 g by mouth daily.   [name] Kitchen gabapentin (NEURONTIN) 100 MG capsule Take 100 mg by mouth 2 (two) times daily.  [name] Kitchen HYDROcodone-acetaminophen (NORCO) 5-325 MG tablet Take 0.5 tablets by mouth daily.  [name] Kitchen JARDIANCE 10 MG TABS tablet Take 10 mg by mouth daily.  [name] Kitchen latanoprost (XALATAN) 0.005 % ophthalmic solution Place 1 drop into both eyes at bedtime.  . metFORMIN (GLUCOPHAGE) 500 MG tablet Take 500 mg by mouth daily.  . Multiple Vitamin (MULTIVITAMIN) capsule Take 1 capsule by mouth at bedtime.   . mupirocin ointment (BACTROBAN) 2 % 1 Application daily. Apply to Right lateral foot wound topically every evening shift for right lateral foot wound Clean with wound cleanser daily, dry, apply Mupirocin ointment and cover with Mepilex  . nystatin cream (MYCOSTATIN) Apply 1 Application topically 2 (two) times daily.  . rosuvastatin (CRESTOR) 40 MG tablet Take 40 mg by mouth daily.  . tamsulosin (FLOMAX) 0.4 MG CAPS capsule Take 0.4 mg by mouth at bedtime.  . timolol (TIMOPTIC) 0.5 % ophthalmic solution Place 1 drop into both eyes every morning.  . triamcinolone cream (KENALOG) 0.5 % Apply 1 Application topically 2 (two) times daily.   Facility-Administered Encounter Medications as of 06/03/2023  Medication  . gemcitabine (GEMZAR) chemo syringe for bladder instillation 2,000 mg    Review of Systems  Constitutional:  Negative for appetite change, fatigue and fever.  HENT:  Positive for hearing loss. Negative for congestion and trouble swallowing.   Eyes:  Positive for visual disturbance.       Low vision  Respiratory:  Negative for cough.   Cardiovascular:  Positive for leg swelling.  Gastrointestinal:  Negative for abdominal pain and constipation.  Genitourinary:  Positive for frequency. Negative for dysuria and hematuria.  Musculoskeletal:  Positive  for arthralgias and gait problem.  Skin:  Positive for wound. Negative for rash.  Neurological:  Positive for numbness. Negative for weakness and headaches.       Hx of numbness in legs.   Psychiatric/Behavioral:  Positive  for self-injury. Negative for behavioral problems and sleep disturbance. The patient is not nervous/anxious.     Immunization History  Administered Date(s) Administered  . Influenza-Unspecified 06/02/2014  . PNEUMOCOCCAL CONJUGATE-20 02/11/2023  . Tdap 08/25/2022  . Zoster Recombinant(Shingrix) 06/18/2018, 08/28/2018   Pertinent  Health Maintenance Due  Topic Date Due  . OPHTHALMOLOGY EXAM  Never done  . INFLUENZA VACCINE  04/03/2023  . HEMOGLOBIN A1C  08/01/2023  . FOOT EXAM  02/06/2024      03/28/2019    8:25 AM 03/22/2022   11:09 AM 08/06/2022    9:18 PM 08/25/2022    6:51 PM 02/04/2023    4:10 PM  Fall Risk  Falls in the past year?     1  Was there an injury with Fall?     1  Fall Risk Category Calculator     3  (RETIRED) Patient Fall Risk Level High fall risk Moderate fall risk Low fall risk Low fall risk   Patient at Risk for Falls Due to     History of fall(s);Impaired balance/gait;Impaired mobility  Fall risk Follow up     Falls evaluation completed   Functional Status Survey:    Vitals:   06/03/23 1417  BP: (!) 150/87  Pulse: 91  Resp: 18  Temp: (!) 97.5 F (36.4 C)  SpO2: 97%  Weight: 150 lb 12.8 oz (68.4 kg)  Height: 5\' 9"  (1.753 m)   Body mass index is 22.27 kg/m. Physical Exam Vitals and nursing note reviewed.  Constitutional:      Appearance: Normal appearance.  HENT:     Head: Normocephalic and atraumatic.     Nose: Nose normal.     Mouth/Throat:     Mouth: Mucous membranes are moist.     Comments:   Eyes:     Extraocular Movements: Extraocular movements intact.     Conjunctiva/sclera: Conjunctivae normal.     Pupils: Pupils are equal, round, and reactive to light.  Cardiovascular:     Rate and Rhythm: Normal rate and  regular rhythm.     Heart sounds: No murmur heard. Pulmonary:     Effort: Pulmonary effort is normal.     Breath sounds: No rales.  Abdominal:     General: Bowel sounds are normal.     Palpations: Abdomen is soft.     Tenderness: There is no abdominal tenderness.  Musculoskeletal:        General: No tenderness.     Cervical back: Normal range of motion and neck supple.     Right lower leg: Edema present.     Left lower leg: Edema present.     Comments: Trace edema in ankles.   Skin:    General: Skin is warm and dry.     Findings: No rash.     Comments: the lateral right foot open wound about a dime sized, punched out appearance, gray wound bed, slow healing, no s/s of infection.    Neurological:     General: No focal deficit present.     Mental Status: He is alert and oriented to person, place, and time. Mental status is at baseline.     Gait: Gait abnormal.  Psychiatric:        Mood and Affect: Mood normal.        Behavior: Behavior normal.        Thought Content: Thought content normal.        Judgment: Judgment normal.    Labs reviewed: Recent Labs  08/25/22 1848 01/29/23 1606 01/30/23 0508 02/11/23 0000  NA 137 134* 136 139  K 4.3 4.7 3.6 4.6  CL 104 102 108 107  CO2 25 22 20* 25*  GLUCOSE 171* 211* 135*  --   BUN 25* 33* 26* 18  CREATININE 0.86 0.98 0.88 1.0  CALCIUM 9.7 9.5 8.5* 8.8  MG  --   --  2.1  --   PHOS  --   --  3.4  --    Recent Labs    08/06/22 1933 01/29/23 1606 01/30/23 0508  AST 69* 30 22  ALT 66* 33 24  ALKPHOS 59 42 33*  BILITOT 1.1 1.3* 1.2  PROT 6.5 7.4 5.6*  ALBUMIN 3.6 3.9 2.9*   Recent Labs    08/25/22 1848 01/29/23 1606 01/30/23 0508 02/11/23 0000  WBC 5.2 8.7 5.9 3.8  NEUTROABS  --  7.2  --   --   HGB 13.0 13.3 10.2* 10.6*  HCT 40.8 41.1 31.1* 32*  MCV 100.2* 97.4 96.9  --   PLT 161 161 109* 192   Lab Results  Component Value Date   TSH 0.885 03/23/2019   Lab Results  Component Value Date   HGBA1C 7.8 (H)  01/29/2023   Lab Results  Component Value Date   CHOL 121 02/20/2023   HDL 43 02/20/2023   LDLCALC 56 02/20/2023   TRIG 134 02/20/2023   CHOLHDL 2.6 12/10/2013    Significant Diagnostic Results in last 30 days:  No results found.  Assessment/Plan Blood loss anemia blood loss, Hgb 10.4 02/11/23  Type 2 diabetes mellitus (HCC) Hgb A1c 8.2 01/02/23>>7.8 01/29/23 since started Meformin, Jardiance, Bun/creat 18/0.97 02/11/23  Urinary frequency Hx of bladder neoplasm,  2-3x nocturnal urination, no difficulty returning asleep, on Tamsulosin. 02/05/23 Urology resume Plavix. Cysto 3 months. BCG #1of 3(immuno therapy)  Two-vessel CAD status post CABG x2;  GABG x2, post Op afib, takes Plavix, Metoprolol, Jardiance, and statin  Vitamin D deficiency on Vit D3, Vit D 51 01/02/23  Vitamin B12 deficiency on Vit B12, Vit B12 877, Hgb 10.6 02/11/23  Hereditary and idiopathic peripheral neuropathy on Gabapentin, ambulates with walker.   Dyslipidemia, goal LDL below 70 on Zetia, Fish oil, Rosuvastatin, LDL 56 02/20/23  Paroxysmal tachycardia (HCC) Heart rate is in control, 02/12/23 off Metoprolol 2/2 low Bp,  TSH 2.91 01/02/23  Hypotension Normalized, off Metoprolol   Diabetic foot ulcer (HCC) The lateral right foot open wound about a dime sized, punched out appearance, gray wound bed, healing slowly        Family/ staff Communication: plan of care reviewed with the patient and charge nurse.   Labs/tests ordered:  none  Time spend 40 minutes.

## 2023-06-03 NOTE — Assessment & Plan Note (Signed)
on Vit B12, Vit B12 877, Hgb 10.6 02/11/23 

## 2023-06-03 NOTE — Assessment & Plan Note (Signed)
Hgb A1c 8.2 01/02/23>>7.8 01/29/23 since started Meformin, Jardiance, Bun/creat 18/0.97 02/11/23 

## 2023-06-03 NOTE — Assessment & Plan Note (Signed)
GABG x2, post Op afib, takes Plavix, Metoprolol, Jardiance, and statin 

## 2023-06-03 NOTE — Assessment & Plan Note (Signed)
on Vit D3, Vit D 51 01/02/23

## 2023-06-03 NOTE — Assessment & Plan Note (Signed)
on Gabapentin, ambulates with walker.  

## 2023-06-03 NOTE — Assessment & Plan Note (Signed)
Normalized, off Metoprolol.  

## 2023-06-03 NOTE — Assessment & Plan Note (Signed)
blood loss, Hgb 10.4 02/11/23 

## 2023-06-03 NOTE — Assessment & Plan Note (Signed)
Heart rate is in control,  02/12/23 off Metoprolol 2/2 low Bp,  TSH 2.91 01/02/23 

## 2023-06-03 NOTE — Assessment & Plan Note (Signed)
The lateral right foot open wound about a dime sized, punched out appearance, gray wound bed, healing slowly

## 2023-06-16 ENCOUNTER — Encounter: Payer: Self-pay | Admitting: Orthopedic Surgery

## 2023-06-16 ENCOUNTER — Non-Acute Institutional Stay: Payer: Medicare Other | Admitting: Orthopedic Surgery

## 2023-06-16 DIAGNOSIS — R310 Gross hematuria: Secondary | ICD-10-CM

## 2023-06-16 NOTE — Progress Notes (Addendum)
Location:   [facility] Home Room Number: 926-A Place of Service:  ALF [phone]) Provider:  Hazle Nordmann, NP  PCP: [name], MD  Patient Care Team: [name], MD as PCP - General (Internal Medicine) [name], MD as PCP - Cardiology (Cardiology)  Extended Emergency Contact Information Primary Emergency Contact: [name],[name] Mobile Phone: [phone] Relation: Nephew Preferred language: English Interpreter needed? No Secondary Emergency Contact: wright,[name] Mobile Phone: [phone] Relation: Niece  Code Status:  DNR Goals of care: Advanced Directive information    06/16/2023    1:29 PM  Advanced Directives  Does Patient Have a Medical Advance Directive? Yes  Type of Advance Directive Out of facility DNR (pink MOST or yellow form)  Does patient want to make changes to medical advance directive? No - Patient declined     Chief Complaint  Patient presents with   Acute Visit    Hematuria    HPI:  Pt is a 89 y.o. male seen today for acute visit due to hematuria.   He currently resides on the assisted living unit at [facility] PMH: atrial fibrillation, mild aortic insufficiency, PVD, TIA, dilated cardiomyopathy, CABG x 2, T2DM, OA, bladder neoplasm with gross hematuria, thrombocytopenia, and unstable gait.   H/o bladder neoplasm with hematuria. S/p TURP 06/2022. He is followed by [facility]. Plavix was restarted earlier this year per urology 03/2023. 10/11 nursing reports pink tinges urine with small blood clots. No other episodes. He denies dysuria, frequency or flank pain. Afebrile. Vitals stable.   H/o blood loss anemia. Hgb/hct  10.2/ 31.1 02/11/2023.    Past Medical History:  Diagnosis Date   Anticoagulant long-term use    plavix  (for hx TIA)   Arthritis    right shoulder    Benign essential tremor    hands -- occasional   CAD S/P percutaneous coronary angioplasty cardiologist-- dr harding   a) PCI and BMS to  LAD in 1991; b) CATH 10/2010: Severe 2 Vessel disease = diffuse RCA ( 75% prox, 90% mid & 70 + 75% distal) & LAD (80-90% pre-stent, 75% post-stent) with occluded D1 (filled via OM-D1 collaterals)  -- CABG x 2; c) Myoview 4/'15: INTERMEDIATE RISK - small sized reversible apical/anterolateral defect -- most likely c/w known occlusion of D1 with collaterals.(med Rx)   History of basal cell carcinoma (BCC) excision    History of bladder cancer urologist-- dr eskridge   dx 11/ 2015-- s/p TURBT   History of kidney stones    History of TIA (transient ischemic attack) 12/09/2013   History of urethral stricture    Hyperlipidemia with target LDL less than 70    controlled.   Neuropathy of lower extremity    Nocturia    Osteoporosis    Peripheral neuropathy    lower extremity   Postoperative atrial fibrillation (HCC) 10/2010   after bypass-"shocked back in and no problems since":   per cardiologist note, [name], no recurrence   Pre-diabetes    S/P CABG x 2 10/18/2010   LIMA-LAD, SVG to RPDA (Dr. Cornelius Moras)   TGA (transient global amnesia)    "x3 for hours then goes away"   Transient Dilated idiopathic cardiomyopathy April 2015; 04/2014   a) in setting of TIA: EF 35-40% (reduced from 55-65 percent) mild AI, moderate and dilated aorta.;; b) Recheck Echo 04/2014: EF 55-60%, Gr 1 DD, mild-mod AI.     Wears glasses    Wears partial dentures    upper  Past Surgical History:  Procedure Laterality Date   APPENDECTOMY  as teen   BASAL CELL CARCINOMA EXCISION  07/2014   CARDIAC CATHETERIZATION  10/17/2010   80%prox w/mild in-stent restenosis w/75% mid-LADstenosis aft the stent,multi severe stenoses RCAw/heavily calcified vessel,norm LV   CARDIOVERSION  2012   after bypass surgery   CATARACT EXTRACTION W/ INTRAOCULAR LENS  IMPLANT, BILATERAL  2009  approx.   COCCYX REMOVAL     CORONARY ANGIOPLASTY WITH STENT PLACEMENT  1991   PCI and BMS to LAD   CORONARY ARTERY BYPASS GRAFT  10-18-2010  [name] @[facility]     LIMA-LAD, SVG-rPDA   CYSTOSCOPY W/ RETROGRADES N/A 07/12/2014   Procedure: CYSTOSCOPY WITH RETROGRADE PYELOGRAM;  Surgeon: [name], MD;  Location: [facility];  Service: Urology;  Laterality: N/A;   CYSTOSCOPY WITH BIOPSY N/A 09/09/2014   Procedure: CYSTO WITH BIOPSY AND FULGERATION, DILATION OF URETHRAL STRICTURE;  Surgeon: [name], MD;  Location: [facility];  Service: Urology;  Laterality: N/A;  BLADDER BIOPSY   CYSTOSCOPY WITH BIOPSY N/A 01/03/2015   Procedure: CYSTOSCOPY WITH BIOPSY;  Surgeon: [name], MD;  Location: [facility];  Service: Urology;  Laterality: N/A;   CYSTOSCOPY WITH BIOPSY N/A 01/26/2019   Procedure: CYSTOSCOPY WITH BIOPSY, BILATERAL RETROGRADE PYELOGRAM;  Surgeon: [name], MD;  Location[facility];  Service: Urology;  Laterality: N/A;   CYSTOSCOPY WITH FULGERATION N/A 01/26/2019   Procedure: CYSTOSCOPY WITH FULGERATION;  Surgeon: [name], MD;  Location: [facility];  Service: Urology;  Laterality: N/A;   CYSTOSCOPY WITH URETHRAL DILATATION N/A 01/03/2015   Procedure: CYSTOSCOPY WITH URETHRAL DILATATION;  Surgeon: [name], MD;  Location: [facility];  Service: Urology;  Laterality: N/A;   DOPPLER ECHOCARDIOGRAPHY  10/18/2004   EF 55-65%,BORDERLINE -enlarged aortic root,mild aortic insuff.,trace tricus.insuff.   FINGER SURGERY Left 03-17-2001   dr [name]  @[facility]    repair complex laceration injury 5th digit   KIDNEY STONE SURGERY  1989   NM MYOVIEW LTD  12/16/2013   INTERMEDIATE RISK test with small sized reversible perfusion defect in the apical anterolateral wall -- most likely consistent with known occlusion of D1 with collaterals.   shoulder surgery Right 1988   TONSILLECTOMY  as teen   TRANSTHORACIC ECHOCARDIOGRAM  April 2015   EF 35-40%, mild AI, moderately dilated ascending aorta   TRANSURETHRAL RESECTION OF BLADDER TUMOR Right 06/11/2022   Procedure: TRANSURETHRAL RESECTION OF BLADDER TUMOR (TURBT)   >5cm;  Surgeon: [name], MD;  Location: [facility];  Service: Urology;  Laterality: Right;   TRANSURETHRAL RESECTION OF PROSTATE N/A 07/12/2014   Procedure: TRANSURETHRAL RESECTION OF THE PROSTATE (TURP) WITH MITOMYCIN -C;  Surgeon: [name], MD;  Location: [facility];  Service: Urology;  Laterality: N/A;   TRANSURETHRAL RESECTION OF PROSTATE N/A 06/11/2022   Procedure: TRANSURETHRAL RESECTION OF THE PROSTATE (TURP);  Surgeon: [name], MD;  Location: [facility];  Service: Urology;  Laterality: N/A;  REQUESTING 2 HRS    No Known Allergies  Allergies as of 06/16/2023   No Known Allergies      Medication List        Accurate as of June 16, 2023  1:30 PM. If you have any questions, ask your nurse or doctor.          STOP taking these medications    nystatin cream Commonly known as: MYCOSTATIN Stopped by: Luberta Robertson Johnta Couts   triamcinolone cream 0.5 % Commonly known as: KENALOG Stopped by: Octavia Heir  TAKE these medications    acetaminophen 500 MG tablet Commonly known as: TYLENOL Take 1,000 mg by mouth every 6 (six) hours as needed for mild pain.   Calcium Antacid Ultra Strength 1000 MG chewable tablet Generic drug: calcium elemental as carbonate Chew 1,000 mg by mouth daily.   clopidogrel 75 MG tablet Commonly known as: PLAVIX Take 75 mg by mouth daily.   ezetimibe 10 MG tablet Commonly known as: ZETIA Take 10 mg by mouth daily.   fish oil-omega-3 fatty acids 1000 MG capsule Take 1 g by mouth daily.   gabapentin 100 MG capsule Commonly known as: NEURONTIN Take 100 mg by mouth 2 (two) times daily.   HYDROcodone-acetaminophen 5-325 MG tablet Commonly known as: Norco Take 0.5 tablets by mouth daily.   Jardiance 10 MG Tabs tablet Generic drug: empagliflozin Take 10 mg by mouth daily.   latanoprost 0.005 % ophthalmic solution Commonly known as: XALATAN Place 1 drop into both eyes at bedtime.   metFORMIN 500 MG tablet Commonly known  as: GLUCOPHAGE Take 500 mg by mouth daily.   multivitamin capsule Take 1 capsule by mouth at bedtime.   mupirocin ointment 2 % Commonly known as: BACTROBAN 1 Application daily. Apply to Right lateral foot wound topically every evening shift for right lateral foot wound Clean with wound cleanser daily, dry, apply Mupirocin ointment and cover with Mepilex   Prolia 60 MG/ML Sosy injection Generic drug: denosumab Inject 60 mg into the skin every 6 (six) months.   rosuvastatin 40 MG tablet Commonly known as: CRESTOR Take 40 mg by mouth daily.   tamsulosin 0.4 MG Caps capsule Commonly known as: FLOMAX Take 0.4 mg by mouth at bedtime.   timolol 0.5 % ophthalmic solution Commonly known as: TIMOPTIC Place 1 drop into both eyes every morning.   VITAMIN B12 PO Place 5,000 mcg under the tongue daily.   vitamin D3 50 MCG (2000 UT) Caps Take 2,000 Units by mouth daily.   Voltaren 1 % Gel Generic drug: diclofenac Sodium Apply topically 4 (four) times daily.   Zinc Oxide 10 % Oint Apply 1 Application topically as needed (Apply to right buttocks.).        Review of Systems  Constitutional:  Negative for activity change and appetite change.  Respiratory:  Negative for cough, shortness of breath and wheezing.   Cardiovascular:  Negative for chest pain and leg swelling.  Genitourinary:  Positive for hematuria. Negative for decreased urine volume, dysuria, flank pain and frequency.  Psychiatric/Behavioral:  Negative for dysphoric mood. The patient is not nervous/anxious.     Immunization History  Administered Date(s) Administered   Influenza-Unspecified 06/02/2014   PNEUMOCOCCAL CONJUGATE-20 02/11/2023   Tdap 08/25/2022   Zoster Recombinant(Shingrix) 06/18/2018, 08/28/2018   Pertinent  Health Maintenance Due  Topic Date Due   OPHTHALMOLOGY EXAM  Never done   INFLUENZA VACCINE  04/03/2023   HEMOGLOBIN A1C  08/01/2023   FOOT EXAM  02/06/2024      03/28/2019    8:25 AM  03/22/2022   11:09 AM 08/06/2022    9:18 PM 08/25/2022    6:51 PM 02/04/2023    4:10 PM  Fall Risk  Falls in the past year?     1  Was there an injury with Fall?     1  Fall Risk Category Calculator     3  (RETIRED) Patient Fall Risk Level High fall risk Moderate fall risk Low fall risk Low fall risk   Patient at Risk for Falls  Due to     History of fall(s);Impaired balance/gait;Impaired mobility  Fall risk Follow up     Falls evaluation completed   Functional Status Survey:    Vitals:   06/16/23 1324  BP: 116/64  Pulse: 74  Resp: 18  Temp: 97.8 F (36.6 C)  SpO2: 95%  Weight: 147 lb 3.2 oz (66.8 kg)  Height: 5\' 9"  (1.753 m)   Body mass index is 21.74 kg/m. Physical Exam Vitals reviewed.  Constitutional:      General: He is not in acute distress. HENT:     Head: Normocephalic.  Eyes:     General:        Right eye: No discharge.        Left eye: No discharge.  Cardiovascular:     Rate and Rhythm: Normal rate and regular rhythm.     Pulses: Normal pulses.     Heart sounds: Normal heart sounds.  Pulmonary:     Effort: Pulmonary effort is normal. No respiratory distress.     Breath sounds: Normal breath sounds. No wheezing.  Abdominal:     General: Bowel sounds are normal.     Palpations: Abdomen is soft.     Tenderness: There is no right CVA tenderness or left CVA tenderness.  Musculoskeletal:        General: Normal range of motion.     Cervical back: Neck supple.  Skin:    General: Skin is warm.     Capillary Refill: Capillary refill takes less than 2 seconds.  Neurological:     General: No focal deficit present.     Mental Status: He is alert and oriented to person, place, and time.     Motor: Weakness present.     Gait: Gait abnormal.     Comments: walker  Psychiatric:        Mood and Affect: Mood normal.     Labs reviewed: Recent Labs    08/25/22 1848 01/29/23 1606 01/30/23 0508 02/11/23 0000  NA 137 134* 136 139  K 4.3 4.7 3.6 4.6  CL 104 102  108 107  CO2 25 22 20* 25*  GLUCOSE 171* 211* 135*  --   BUN 25* 33* 26* 18  CREATININE 0.86 0.98 0.88 1.0  CALCIUM 9.7 9.5 8.5* 8.8  MG  --   --  2.1  --   PHOS  --   --  3.4  --    Recent Labs    08/06/22 1933 01/29/23 1606 01/30/23 0508  AST 69* 30 22  ALT 66* 33 24  ALKPHOS 59 42 33*  BILITOT 1.1 1.3* 1.2  PROT 6.5 7.4 5.6*  ALBUMIN 3.6 3.9 2.9*   Recent Labs    08/25/22 1848 01/29/23 1606 01/30/23 0508 02/11/23 0000  WBC 5.2 8.7 5.9 3.8  NEUTROABS  --  7.2  --   --   HGB 13.0 13.3 10.2* 10.6*  HCT 40.8 41.1 31.1* 32*  MCV 100.2* 97.4 96.9  --   PLT 161 161 109* 192   Lab Results  Component Value Date   TSH 0.885 03/23/2019   Lab Results  Component Value Date   HGBA1C 7.8 (H) 01/29/2023   Lab Results  Component Value Date   CHOL 121 02/20/2023   HDL 43 02/20/2023   LDLCALC 56 02/20/2023   TRIG 134 02/20/2023   CHOLHDL 2.6 12/10/2013    Significant Diagnostic Results in last 30 days:  No results found.  Assessment/Plan 1. Gross hematuria -  ongoing, one episode 10/11 - followed by urology - h/o bladder neoplasm s/p TURP 06/2022 - will continue Plavix since one episode, recommend holding if drop in hgb - exam unremarkable - cbc/diff, UA/culture     Family/ staff Communication: plan discussed with patient and nurse  Labs/tests ordered:   cbc/diff, UA/culture

## 2023-06-17 LAB — CBC AND DIFFERENTIAL
HCT: 42 (ref 41–53)
Hemoglobin: 13.3 — AB (ref 13.5–17.5)
Neutrophils Absolute: 2378
Platelets: 159 10*3/uL (ref 150–400)
WBC: 4.3

## 2023-06-17 LAB — CBC: RBC: 4.62 (ref 3.87–5.11)

## 2023-07-25 ENCOUNTER — Encounter: Payer: Self-pay | Admitting: Nurse Practitioner

## 2023-07-25 ENCOUNTER — Non-Acute Institutional Stay (INDEPENDENT_AMBULATORY_CARE_PROVIDER_SITE_OTHER): Payer: Medicare Other | Admitting: Nurse Practitioner

## 2023-07-25 DIAGNOSIS — Z66 Do not resuscitate: Secondary | ICD-10-CM

## 2023-07-25 DIAGNOSIS — Z0000 Encounter for general adult medical examination without abnormal findings: Secondary | ICD-10-CM | POA: Diagnosis not present

## 2023-07-29 ENCOUNTER — Encounter: Payer: Self-pay | Admitting: Nurse Practitioner

## 2023-07-29 NOTE — Progress Notes (Signed)
Subjective:   Tanner [name] is a 89 y.o. male who presents for Medicare Annual/Subsequent preventive examination.  Visit Complete: In person  Patient Medicare AWV questionnaire was completed by the patient on 07/25/23; I have confirmed that all information answered by patient is correct and no changes since this date.  Cardiac Risk Factors include: advanced age (>78men, >23 women);diabetes mellitus;dyslipidemia;hypertension;male gender     Objective:    Today's Vitals   07/25/23 1008  BP: 106/61  Pulse: 78  Resp: 18  Temp: 97.8 F (36.6 C)  SpO2: 98%  Weight: 148 lb 3.2 oz (67.2 kg)  Height: 5\' 9"  (1.753 m)   Body mass index is 21.89 kg/m.     07/25/2023   10:10 AM 06/16/2023    1:29 PM 06/03/2023    2:26 PM 03/25/2023    2:54 PM 03/24/2023    1:45 PM 03/19/2023    8:52 AM 02/04/2023    4:09 PM  Advanced Directives  Does Patient Have a Medical Advance Directive? Yes Yes Yes Yes Yes Yes Yes  Type of Advance Directive Out of facility DNR (pink MOST or yellow form) Out of facility DNR (pink MOST or yellow form) Out of facility DNR (pink MOST or yellow form) Out of facility DNR (pink MOST or yellow form) Out of facility DNR (pink MOST or yellow form) Out of facility DNR (pink MOST or yellow form) Out of facility DNR (pink MOST or yellow form)  Does patient want to make changes to medical advance directive? No - Patient declined No - Patient declined No - Patient declined No - Patient declined No - Patient declined No - Patient declined No - Patient declined  Pre-existing out of facility DNR order (yellow form or pink MOST form)   Yellow form placed in chart (order not valid for inpatient use) Yellow form placed in chart (order not valid for inpatient use)  Yellow form placed in chart (order not valid for inpatient use)     Current Medications (verified) Outpatient Encounter Medications as of 07/25/2023  Medication Sig   acetaminophen (TYLENOL) 500 MG tablet Take 1,000 mg by  mouth every 6 (six) hours as needed for mild pain.   calcium elemental as carbonate (CALCIUM ANTACID ULTRA STRENGTH) 400 MG chewable tablet Chew 1,000 mg by mouth daily.   Cholecalciferol (VITAMIN D3) 50 MCG (2000 UT) CAPS Take 2,000 Units by mouth daily.   clopidogrel (PLAVIX) 75 MG tablet Take 75 mg by mouth daily.   Cyanocobalamin (VITAMIN B12 PO) Place 5,000 mcg under the tongue daily.   denosumab (PROLIA) 60 MG/ML SOSY injection Inject 60 mg into the skin every 6 (six) months.   diclofenac Sodium (VOLTAREN) 1 % GEL Apply topically 4 (four) times daily.   ezetimibe (ZETIA) 10 MG tablet Take 10 mg by mouth daily.   fish oil-omega-3 fatty acids 1000 MG capsule Take 1 g by mouth daily.    gabapentin (NEURONTIN) 100 MG capsule Take 100 mg by mouth 2 (two) times daily.   HYDROcodone-acetaminophen (NORCO) 5-325 MG tablet Take 0.5 tablets by mouth daily.   JARDIANCE 10 MG TABS tablet Take 10 mg by mouth daily.   latanoprost (XALATAN) 0.005 % ophthalmic solution Place 1 drop into both eyes at bedtime.   metFORMIN (GLUCOPHAGE) 500 MG tablet Take 500 mg by mouth daily.   Multiple Vitamin (MULTIVITAMIN) capsule Take 1 capsule by mouth at bedtime.    mupirocin ointment (BACTROBAN) 2 % 1 Application daily. Apply to Right lateral foot wound  topically every evening shift for right lateral foot wound Clean with wound cleanser daily, dry, apply Mupirocin ointment and cover with Mepilex   rosuvastatin (CRESTOR) 40 MG tablet Take 40 mg by mouth daily.   tamsulosin (FLOMAX) 0.4 MG CAPS capsule Take 0.4 mg by mouth at bedtime.   timolol (TIMOPTIC) 0.5 % ophthalmic solution Place 1 drop into both eyes every morning.   Zinc Oxide 10 % OINT Apply 1 Application topically as needed (Apply to right buttocks.).   Facility-Administered Encounter Medications as of 07/25/2023  Medication   gemcitabine (GEMZAR) chemo syringe for bladder instillation 2,000 mg    Allergies (verified) Patient has no known allergies.    History: Past Medical History:  Diagnosis Date   Anticoagulant long-term use    plavix  (for hx TIA)   Arthritis    right shoulder    Benign essential tremor    hands -- occasional   CAD S/P percutaneous coronary angioplasty cardiologist-- dr harding   a) PCI and BMS to LAD in 1991; b) CATH 10/2010: Severe 2 Vessel disease = diffuse RCA ( 75% prox, 90% mid & 70 + 75% distal) & LAD (80-90% pre-stent, 75% post-stent) with occluded D1 (filled via OM-D1 collaterals)  -- CABG x 2; c) Myoview 4/'15: INTERMEDIATE RISK - small sized reversible apical/anterolateral defect -- most likely c/w known occlusion of D1 with collaterals.(med Rx)   History of basal cell carcinoma (BCC) excision    History of bladder cancer urologist-- dr eskridge   dx 11/ 2015-- s/p TURBT   History of kidney stones    History of TIA (transient ischemic attack) 12/09/2013   History of urethral stricture    Hyperlipidemia with target LDL less than 70    controlled.   Neuropathy of lower extremity    Nocturia    Osteoporosis    Peripheral neuropathy    lower extremity   Postoperative atrial fibrillation (HCC) 10/2010   after bypass-"shocked back in and no problems since":   per cardiologist note, [name], no recurrence   Pre-diabetes    S/P CABG x 2 10/18/2010   LIMA-LAD, SVG to RPDA (Dr. Cornelius Moras)   TGA (transient global amnesia)    "x3 for hours then goes away"   Transient Dilated idiopathic cardiomyopathy April 2015; 04/2014   a) in setting of TIA: EF 35-40% (reduced from 55-65 percent) mild AI, moderate and dilated aorta.;; b) Recheck Echo 04/2014: EF 55-60%, Gr 1 DD, mild-mod AI.     Wears glasses    Wears partial dentures    upper   Past Surgical History:  Procedure Laterality Date   APPENDECTOMY  as teen   BASAL CELL CARCINOMA EXCISION  07/2014   CARDIAC CATHETERIZATION  10/17/2010   80%prox w/mild in-stent restenosis w/75% mid-LADstenosis aft the stent,multi severe stenoses RCAw/heavily calcified  vessel,norm LV   CARDIOVERSION  2012   after bypass surgery   CATARACT EXTRACTION W/ INTRAOCULAR LENS  IMPLANT, BILATERAL  2009  approx.   COCCYX REMOVAL     CORONARY ANGIOPLASTY WITH STENT PLACEMENT  1991   PCI and BMS to LAD   CORONARY ARTERY BYPASS GRAFT  10-18-2010  [name] @[facility]    LIMA-LAD, SVG-rPDA   CYSTOSCOPY W/ RETROGRADES N/A 07/12/2014   Procedure: CYSTOSCOPY WITH RETROGRADE PYELOGRAM;  Surgeon: [name], MD;  Location: [facility];  Service: Urology;  Laterality: N/A;   CYSTOSCOPY WITH BIOPSY N/A 09/09/2014   Procedure: CYSTO WITH BIOPSY AND FULGERATION, DILATION OF URETHRAL STRICTURE;  Surgeon: [name], MD;  Location: [facility];  Service: Urology;  Laterality: N/A;  BLADDER BIOPSY   CYSTOSCOPY WITH BIOPSY N/A 01/03/2015   Procedure: CYSTOSCOPY WITH BIOPSY;  Surgeon: [name], MD;  Location: [facility];  Service: Urology;  Laterality: N/A;   CYSTOSCOPY WITH BIOPSY N/A 01/26/2019   Procedure: CYSTOSCOPY WITH BIOPSY, BILATERAL RETROGRADE PYELOGRAM;  Surgeon: [name], MD;  Location[facility];  Service: Urology;  Laterality: N/A;   CYSTOSCOPY WITH FULGERATION N/A 01/26/2019   Procedure: CYSTOSCOPY WITH FULGERATION;  Surgeon: [name], MD;  Location: [facility];  Service: Urology;  Laterality: N/A;   CYSTOSCOPY WITH URETHRAL DILATATION N/A 01/03/2015   Procedure: CYSTOSCOPY WITH URETHRAL DILATATION;  Surgeon: [name], MD;  Location: [facility];  Service: Urology;  Laterality: N/A;   DOPPLER ECHOCARDIOGRAPHY  10/18/2004   EF 55-65%,BORDERLINE -enlarged aortic root,mild aortic insuff.,trace tricus.insuff.   FINGER SURGERY Left 03-17-2001   dr [name]  @[facility]    repair complex laceration injury 5th digit   KIDNEY STONE SURGERY  1989   NM MYOVIEW LTD  12/16/2013   INTERMEDIATE RISK test with small sized reversible perfusion defect in the apical anterolateral wall -- most likely consistent with known occlusion of D1 with  collaterals.   shoulder surgery Right 1988   TONSILLECTOMY  as teen   TRANSTHORACIC ECHOCARDIOGRAM  April 2015   EF 35-40%, mild AI, moderately dilated ascending aorta   TRANSURETHRAL RESECTION OF BLADDER TUMOR Right 06/11/2022   Procedure: TRANSURETHRAL RESECTION OF BLADDER TUMOR (TURBT)  >5cm;  Surgeon: [name], MD;  Location: [facility];  Service: Urology;  Laterality: Right;   TRANSURETHRAL RESECTION OF PROSTATE N/A 07/12/2014   Procedure: TRANSURETHRAL RESECTION OF THE PROSTATE (TURP) WITH MITOMYCIN -C;  Surgeon: [name], MD;  Location: [facility];  Service: Urology;  Laterality: N/A;   TRANSURETHRAL RESECTION OF PROSTATE N/A 06/11/2022   Procedure: TRANSURETHRAL RESECTION OF THE PROSTATE (TURP);  Surgeon: [name], MD;  Location: [facility];  Service: Urology;  Laterality: N/A;  REQUESTING 2 HRS   Family History  Problem Relation Age of Onset   Heart Problems Mother    Parkinson's disease Father    Heart Problems Father    Social History   Socioeconomic History   Marital status: Married    Spouse name: Mary   Number of children: 0   Years of education: BS, MS   Highest education level: Not on file  Occupational History   Occupation: Retired  Tobacco Use   Smoking status: Former    Current packs/day: 0.00    Average packs/day: 1 pack/day for 5.0 years (5.0 ttl pk-yrs)    Types: Cigarettes    Start date: 09/02/1952    Quit date: 09/02/1957    Years since quitting: 65.9   Smokeless tobacco: Never  Vaping Use   Vaping status: Never Used  Substance and Sexual Activity   Alcohol use: Never    Alcohol/week: 0.0 standard drinks of alcohol   Drug use: Never   Sexual activity: Not Currently  Other Topics Concern   Not on file  Social History Narrative   Pt lives at home with spouse.   Caffeine Use: very little      He stopped doing the [employer] building about 6 months ago, indicating that he just has not had the same out of energy.  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  He says that now he can only do about 2 and half hours  worth of work before you stop and rest.   Social Determinants of Health   Financial Resource Strain: Not on file  Food Insecurity: Not on file  Transportation Needs: Not on file  Physical Activity: Not on file  Stress: Not on file  Social Connections: Not on file    Tobacco Counseling Counseling given: Not Answered   Clinical Intake:  Pre-visit preparation completed: Yes  Pain : No/denies pain     BMI - recorded: 21.89 Nutritional Status: BMI of 19-24  Normal Nutritional Risks: None Diabetes: Yes CBG done?: No Did pt. bring in CBG monitor from home?: No  How often do you need to have someone help you when you read instructions, pamphlets, or other written materials from your doctor or pharmacy?: 4 - Often What is the last grade level you completed in school?: college  Interpreter Needed?: No  Information entered by :: Parag Dorton Nedra Hai NP   Activities of Daily Living    07/29/2023    1:43 PM 01/29/2023    9:40 PM  In your present state of health, do you have any difficulty performing the following activities:  Hearing? 1 1  Comment hearing aids, read lips, speaker adjust tone of voice.   Vision? 1 0  Comment glasses   Difficulty concentrating or making decisions? 1 1  Walking or climbing stairs? 1 1  Dressing or bathing? 1 1  Doing errands, shopping? 1 0  Preparing Food and eating ? N   Using the Toilet? N   In the past six months, have you accidently leaked urine? Y   Do you have problems with loss of bowel control? N   Managing your Medications? Y   Managing your Finances? Y   Housekeeping or managing your Housekeeping? Y     Patient Care Team: [name], MD as PCP - General (Internal Medicine) [name], MD as PCP - Cardiology (Cardiology)  Indicate any recent Medical Services you may have received from other than [facility] providers  in the past year (date may be approximate).     Assessment:   This is a routine wellness examination for Tanner [name].  Hearing/Vision screen No results found.   Goals Addressed             This Visit's Progress    Maintain Mobility and Function       Evidence-based guidance:  Emphasize the importance of physical activity and aerobic exercise as included in treatment plan; assess barriers to adherence; consider patient's abilities and preferences.  Encourage gradual increase in activity or exercise instead of stopping if pain occurs.  Reinforce individual therapy exercise prescription, such as strengthening, stabilization and stretching programs.  Promote optimal body mechanics to stabilize the spine with lifting and functional activity.  Encourage activity and mobility modifications to facilitate optimal function, such as using a log roll for bed mobility or dressing from a seated position.  Reinforce individual adaptive equipment recommendations to limit excessive spinal movements, such as a Event organiser.  Assess adequacy of sleep; encourage use of sleep hygiene techniques, such as bedtime routine; use of white noise; dark, cool bedroom; avoiding daytime naps, heavy meals or exercise before bedtime.  Promote positions and modification to optimize sleep and sexual activity; consider pillows or positioning devices to assist in maintaining neutral spine.  Explore options for applying ergonomic principles at work and home, such as frequent position changes, using ergonomically designed equipment and working at optimal height.  Promote modifications to increase comfort with driving  such as lumbar support, optimizing seat and steering wheel position, using cruise control and taking frequent rest stops to stretch and walk.   Notes:        Depression Screen    07/29/2023    1:46 PM  PHQ 2/9 Scores  PHQ - 2 Score 0    Fall Risk    02/04/2023    4:10 PM 05/08/2016    8:22 AM  10/16/2015    1:11 PM  Fall Risk   Falls in the past year? 1 No No  Number falls in past yr: 1    Injury with Fall? 1    Risk for fall due to : History of fall(s);Impaired balance/gait;Impaired mobility    Follow up Falls evaluation completed      MEDICARE RISK AT HOME: Medicare Risk at Home Any stairs in or around the home?: Yes If so, are there any without handrails?: No Home free of loose throw rugs in walkways, pet beds, electrical cords, etc?: Yes Adequate lighting in your home to reduce risk of falls?: Yes Life alert?: No Use of a cane, walker or w/c?: Yes Shower chair or bench in shower?: Yes Elevated toilet seat or a handicapped toilet?: Yes  TIMED UP AND GO:  Was the test performed?  Yes  Length of time to ambulate 10 feet: 15 sec Gait slow and steady with assistive device    Cognitive Function:    02/06/2023    2:13 PM  MMSE - Mini Mental State Exam  Copy design --  Copy design-comments MMSE 25/30 01/02/23        Immunizations Immunization History  Administered Date(s) Administered   Influenza, High Dose Seasonal PF 06/04/2023   Influenza-Unspecified 06/02/2014   Moderna Covid-19 Vaccine Bivalent Booster 59yrs & up 06/18/2023   PNEUMOCOCCAL CONJUGATE-20 02/11/2023   Tdap 08/25/2022   Zoster Recombinant(Shingrix) 06/18/2018, 08/28/2018    TDAP status: Up to date  Flu Vaccine status: Up to date  Pneumococcal vaccine status: Up to date  Covid-19 vaccine status: Information provided on how to obtain vaccines.   Qualifies for Shingles Vaccine? Yes   Zostavax completed Yes   Shingrix Completed?: Yes  Screening Tests Health Maintenance  Topic Date Due   OPHTHALMOLOGY EXAM  Never done   COVID-19 Vaccine (2 - Moderna risk series) 07/16/2023   HEMOGLOBIN A1C  08/01/2023   FOOT EXAM  07/24/2024   Medicare Annual Wellness (AWV)  07/24/2024   DTaP/Tdap/Td (2 - Td or Tdap) 08/25/2032   Pneumonia Vaccine 61+ Years old  Completed   INFLUENZA VACCINE   Completed   Zoster Vaccines- Shingrix  Completed   HPV VACCINES  Aged Out    Health Maintenance  Health Maintenance Due  Topic Date Due   OPHTHALMOLOGY EXAM  Never done   COVID-19 Vaccine (2 - Moderna risk series) 07/16/2023    Colorectal cancer screening: No longer required.   Lung Cancer Screening: (Low Dose CT Chest recommended if Age 69-80 years, 20 pack-year currently smoking OR have quit w/in 15years.) does not qualify.   Lung Cancer Screening Referral: NA  Additional Screening:  Hepatitis C Screening: does not qualify;   Vision Screening: Recommended annual ophthalmology exams for early detection of glaucoma and other disorders of the eye. Is the patient up to date with their annual eye exam?  No  Who is the provider or what is the name of the office in which the patient attends annual eye exams? [facility] will provide.  If pt is not established  with a provider, would they like to be referred to a provider to establish care? No .   Dental Screening: Recommended annual dental exams for proper oral hygiene  Diabetic Foot Exam: Diabetic Foot Exam: Completed 07/29/23  Community Resource Referral / Chronic Care Management: CRR required this visit?  No   CCM required this visit?  No     Plan:     I have personally reviewed and noted the following in the patient's chart:   Medical and social history Use of alcohol, tobacco or illicit drugs  Current medications and supplements including opioid prescriptions. Patient is currently taking opioid prescriptions. Information provided to patient regarding non-opioid alternatives. Patient advised to discuss non-opioid treatment plan with their provider. Functional ability and status Nutritional status Physical activity Advanced directives List of other physicians Hospitalizations, surgeries, and ER visits in previous 12 months Vitals Screenings to include cognitive, depression, and falls Referrals and appointments  In  addition, I have reviewed and discussed with patient certain preventive protocols, quality metrics, and best practice recommendations. A written personalized care plan for preventive services as well as general preventive health recommendations were provided to patient.     Diana Armijo X Sumeya Yontz, NP   07/29/2023   After Visit Summary: (In Person-Declined) Patient declined AVS at this time.

## 2023-08-04 ENCOUNTER — Encounter: Payer: Self-pay | Admitting: Nurse Practitioner

## 2023-08-04 ENCOUNTER — Non-Acute Institutional Stay: Payer: Medicare Other | Admitting: Nurse Practitioner

## 2023-08-04 DIAGNOSIS — F5101 Primary insomnia: Secondary | ICD-10-CM

## 2023-08-04 DIAGNOSIS — R350 Frequency of micturition: Secondary | ICD-10-CM

## 2023-08-04 DIAGNOSIS — D500 Iron deficiency anemia secondary to blood loss (chronic): Secondary | ICD-10-CM

## 2023-08-04 DIAGNOSIS — I479 Paroxysmal tachycardia, unspecified: Secondary | ICD-10-CM

## 2023-08-04 DIAGNOSIS — I959 Hypotension, unspecified: Secondary | ICD-10-CM

## 2023-08-04 DIAGNOSIS — M150 Primary generalized (osteo)arthritis: Secondary | ICD-10-CM | POA: Diagnosis not present

## 2023-08-04 DIAGNOSIS — E1165 Type 2 diabetes mellitus with hyperglycemia: Secondary | ICD-10-CM

## 2023-08-04 DIAGNOSIS — G4700 Insomnia, unspecified: Secondary | ICD-10-CM | POA: Insufficient documentation

## 2023-08-04 DIAGNOSIS — I2510 Atherosclerotic heart disease of native coronary artery without angina pectoris: Secondary | ICD-10-CM

## 2023-08-04 NOTE — Assessment & Plan Note (Signed)
Hgb 13.3 06/17/23

## 2023-08-04 NOTE — Assessment & Plan Note (Signed)
Normalized

## 2023-08-04 NOTE — Assessment & Plan Note (Signed)
Hgb A1c 8.2 01/02/23>>7.8 01/29/23 since started Meformin, Jardiance, Bun/creat 18/0.97 02/11/23 

## 2023-08-04 NOTE — Assessment & Plan Note (Signed)
post Op afib, takes Plavix, Jardiance, and statin

## 2023-08-04 NOTE — Assessment & Plan Note (Signed)
Hx of bladder neoplasm,  2-3x nocturnal urination, on Tamsulosin. 02/05/23 Urology resume Plavix. Cysto 3 months. BCG #1of 3(immuno therapy)

## 2023-08-04 NOTE — Assessment & Plan Note (Signed)
Chronic left arm pain, up Norco 5/325mg  1/2 bid 07/25/23 for better pain control, ? Contributory to restless night, no pain complained today. Change Norco 5/325mg  1/2 tab bid prn.

## 2023-08-04 NOTE — Assessment & Plan Note (Signed)
Heart rate is in control,  02/12/23 off Metoprolol 2/2 low Bp

## 2023-08-04 NOTE — Progress Notes (Signed)
Location:   [facility] Room Number: 926 Place of Service:  ALF (13) Provider: Arna Snipe Chanley Mcenery NP  [name], MD  Patient Care Team: [name], MD as PCP - General (Internal Medicine) [name], MD as PCP - Cardiology (Cardiology)  Extended Emergency Contact Information Primary Emergency Contact: [name],[name] Mobile Phone: [phone] Relation: Nephew Preferred language: English Interpreter needed? No Secondary Emergency Contact: wright,[name] Mobile Phone: [phone] Relation: Niece  Code Status: DNR Goals of care: Advanced Directive information    07/25/2023   10:10 AM  Advanced Directives  Does Patient Have a Medical Advance Directive? Yes  Type of Advance Directive Out of facility DNR (pink MOST or yellow form)  Does patient want to make changes to medical advance directive? No - Patient declined     Chief Complaint  Patient presents with   Acute Visit    Restless at night, worsened recently, reported yelling at night, Lorazepam x1 was effective.     HPI:  Pt is a 89 y.o. male seen today for an acute visit for restless at night, worsened recently, reported yelling at night, Lorazepam x1 was effective.     Chronic left arm pain, up Norco 5/325mg  1/2 bid 07/25/23 for better pain control, ? Contributory to restless night, no pain complained today.    The lateral right foot open wound about a dime sized, punched out appearance, gray wound bed, near healed.                    Bp controlled, off Metoprolol.  Hematuria, resolved, Plavix was resumed by Urology 02/12/23             Anemia, blood loss, Hgb 13.3 06/17/23 T2DM, Hgb A1c 8.2 01/02/23>>7.8 01/29/23 since started Meformin, Jardiance, Bun/creat 18/0.97 02/11/23             Urinary frequency, Hx of bladder neoplasm,  2-3x nocturnal urination, on Tamsulosin. 02/05/23 Urology resume Plavix. Cysto 3 months. BCG #1of 3(immuno therapy)             CAD, GABG x2, post Op afib, takes Plavix, Jardiance, and  statin             Vitamin D deficiency, on Vit D3, Vit D 51 01/02/23             Vit B12 deficiency, on Vit B12, Vit B12 877             Peripheral neuropathy, on Gabapentin, ambulates with walker.  Glaucoma, wear glasses, on Timolol, Latanoprost HLD on Zetia, Fish oil, Rosuvastatin, LDL 56 02/20/23 Paroxysmal tachycardia, 02/12/23 off Metoprolol 2/2 low Bp,  TSH 2.91 01/02/23 Past Medical History:  Diagnosis Date   Anticoagulant long-term use    plavix  (for hx TIA)   Arthritis    right shoulder    Benign essential tremor    hands -- occasional   CAD S/P percutaneous coronary angioplasty cardiologist-- dr harding   a) PCI and BMS to LAD in 1991; b) CATH 10/2010: Severe 2 Vessel disease = diffuse RCA ( 75% prox, 90% mid & 70 + 75% distal) & LAD (80-90% pre-stent, 75% post-stent) with occluded D1 (filled via OM-D1 collaterals)  -- CABG x 2; c) Myoview 4/'15: INTERMEDIATE RISK - small sized reversible apical/anterolateral defect -- most likely c/w known occlusion of D1 with collaterals.(med Rx)   History of basal cell carcinoma (BCC) excision    History of bladder cancer urologist-- dr eskridge   dx 11/ 2015-- s/p  TURBT   History of kidney stones    History of TIA (transient ischemic attack) 12/09/2013   History of urethral stricture    Hyperlipidemia with target LDL less than 70    controlled.   Neuropathy of lower extremity    Nocturia    Osteoporosis    Peripheral neuropathy    lower extremity   Postoperative atrial fibrillation (HCC) 10/2010   after bypass-"shocked back in and no problems since":   per cardiologist note, [name], no recurrence   Pre-diabetes    S/P CABG x 2 10/18/2010   LIMA-LAD, SVG to RPDA (Dr. Cornelius Moras)   TGA (transient global amnesia)    "x3 for hours then goes away"   Transient Dilated idiopathic cardiomyopathy April 2015; 04/2014   a) in setting of TIA: EF 35-40% (reduced from 55-65 percent) mild AI, moderate and dilated aorta.;; b) Recheck Echo 04/2014: EF  55-60%, Gr 1 DD, mild-mod AI.     Wears glasses    Wears partial dentures    upper   Past Surgical History:  Procedure Laterality Date   APPENDECTOMY  as teen   BASAL CELL CARCINOMA EXCISION  07/2014   CARDIAC CATHETERIZATION  10/17/2010   80%prox w/mild in-stent restenosis w/75% mid-LADstenosis aft the stent,multi severe stenoses RCAw/heavily calcified vessel,norm LV   CARDIOVERSION  2012   after bypass surgery   CATARACT EXTRACTION W/ INTRAOCULAR LENS  IMPLANT, BILATERAL  2009  approx.   COCCYX REMOVAL     CORONARY ANGIOPLASTY WITH STENT PLACEMENT  1991   PCI and BMS to LAD   CORONARY ARTERY BYPASS GRAFT  10-18-2010  [name] @[facility]    LIMA-LAD, SVG-rPDA   CYSTOSCOPY W/ RETROGRADES N/A 07/12/2014   Procedure: CYSTOSCOPY WITH RETROGRADE PYELOGRAM;  Surgeon: [name], MD;  Location: [facility];  Service: Urology;  Laterality: N/A;   CYSTOSCOPY WITH BIOPSY N/A 09/09/2014   Procedure: CYSTO WITH BIOPSY AND FULGERATION, DILATION OF URETHRAL STRICTURE;  Surgeon: [name], MD;  Location: [facility];  Service: Urology;  Laterality: N/A;  BLADDER BIOPSY   CYSTOSCOPY WITH BIOPSY N/A 01/03/2015   Procedure: CYSTOSCOPY WITH BIOPSY;  Surgeon: [name], MD;  Location: [facility];  Service: Urology;  Laterality: N/A;   CYSTOSCOPY WITH BIOPSY N/A 01/26/2019   Procedure: CYSTOSCOPY WITH BIOPSY, BILATERAL RETROGRADE PYELOGRAM;  Surgeon: [name], MD;  Location: [facility];  Service: Urology;  Laterality: N/A;   CYSTOSCOPY WITH FULGERATION N/A 01/26/2019   Procedure: CYSTOSCOPY WITH FULGERATION;  Surgeon: [name], MD;  Location: [facility];  Service: Urology;  Laterality: N/A;   CYSTOSCOPY WITH URETHRAL DILATATION N/A 01/03/2015   Procedure: CYSTOSCOPY WITH URETHRAL DILATATION;  Surgeon: [name], MD;  Location: [facility];  Service: Urology;  Laterality: N/A;   DOPPLER ECHOCARDIOGRAPHY  10/18/2004   EF 55-65%,BORDERLINE -enlarged aortic root,mild  aortic insuff.,trace tricus.insuff.   FINGER SURGERY Left 03-17-2001   dr [name]  @[facility]    repair complex laceration injury 5th digit   KIDNEY STONE SURGERY  1989   NM MYOVIEW LTD  12/16/2013   INTERMEDIATE RISK test with small sized reversible perfusion defect in the apical anterolateral wall -- most likely consistent with known occlusion of D1 with collaterals.   shoulder surgery Right 1988   TONSILLECTOMY  as teen   TRANSTHORACIC ECHOCARDIOGRAM  April 2015   EF 35-40%, mild AI, moderately dilated ascending aorta   TRANSURETHRAL RESECTION OF BLADDER TUMOR Right 06/11/2022   Procedure: TRANSURETHRAL RESECTION OF BLADDER TUMOR (TURBT)  >5cm;  Surgeon: [name], MD;  Location: [facility];  Service: Urology;  Laterality: Right;   TRANSURETHRAL RESECTION OF PROSTATE N/A 07/12/2014   Procedure: TRANSURETHRAL RESECTION OF THE PROSTATE (TURP) WITH MITOMYCIN -C;  Surgeon: [name], MD;  Location: [facility];  Service: Urology;  Laterality: N/A;   TRANSURETHRAL RESECTION OF PROSTATE N/A 06/11/2022   Procedure: TRANSURETHRAL RESECTION OF THE PROSTATE (TURP);  Surgeon: [name], MD;  Location: [facility];  Service: Urology;  Laterality: N/A;  REQUESTING 2 HRS    No Known Allergies  Allergies as of 08/04/2023   No Known Allergies      Medication List        Accurate as of August 04, 2023  1:57 PM. If you have any questions, ask your nurse or doctor.          acetaminophen 500 MG tablet Commonly known as: TYLENOL Take 1,000 mg by mouth every 6 (six) hours as needed for mild pain.   Calcium Antacid Ultra Strength 1000 MG chewable tablet Generic drug: calcium elemental as carbonate Chew 1,000 mg by mouth daily.   clopidogrel 75 MG tablet Commonly known as: PLAVIX Take 75 mg by mouth daily.   ezetimibe 10 MG tablet Commonly known as: ZETIA Take 10 mg by mouth daily.   fish oil-omega-3 fatty acids 1000 MG capsule Take 1 g by mouth daily.   gabapentin 100 MG  capsule Commonly known as: NEURONTIN Take 100 mg by mouth 2 (two) times daily.   HYDROcodone-acetaminophen 5-325 MG tablet Commonly known as: Norco Take 0.5 tablets by mouth daily.   Jardiance 10 MG Tabs tablet Generic drug: empagliflozin Take 10 mg by mouth daily.   latanoprost 0.005 % ophthalmic solution Commonly known as: XALATAN Place 1 drop into both eyes at bedtime.   metFORMIN 500 MG tablet Commonly known as: GLUCOPHAGE Take 500 mg by mouth daily.   multivitamin capsule Take 1 capsule by mouth at bedtime.   mupirocin ointment 2 % Commonly known as: BACTROBAN 1 Application daily. Apply to Right lateral foot wound topically every evening shift for right lateral foot wound Clean with wound cleanser daily, dry, apply Mupirocin ointment and cover with Mepilex   Prolia 60 MG/ML Sosy injection Generic drug: denosumab Inject 60 mg into the skin every 6 (six) months.   rosuvastatin 40 MG tablet Commonly known as: CRESTOR Take 40 mg by mouth daily.   tamsulosin 0.4 MG Caps capsule Commonly known as: FLOMAX Take 0.4 mg by mouth at bedtime.   timolol 0.5 % ophthalmic solution Commonly known as: TIMOPTIC Place 1 drop into both eyes every morning.   VITAMIN B12 PO Place 5,000 mcg under the tongue daily.   vitamin D3 50 MCG (2000 UT) Caps Take 2,000 Units by mouth daily.   Voltaren 1 % Gel Generic drug: diclofenac Sodium Apply topically 4 (four) times daily.   Zinc Oxide 10 % Oint Apply 1 Application topically as needed (Apply to right buttocks.).        Review of Systems  Constitutional:  Negative for appetite change, fatigue and fever.  HENT:  Positive for hearing loss. Negative for congestion and trouble swallowing.   Eyes:  Positive for visual disturbance.       Low vision  Respiratory:  Negative for cough.   Cardiovascular:  Positive for leg swelling.  Gastrointestinal:  Negative for abdominal pain and constipation.  Genitourinary:  Positive for  frequency. Negative for dysuria and hematuria.  Musculoskeletal:  Positive for arthralgias and gait problem.  Skin:  Positive for wound.  Neurological:  Positive for numbness. Negative for weakness and headaches.       Hx of numbness in legs.   Psychiatric/Behavioral:  Positive for agitation and sleep disturbance. The patient is not nervous/anxious.     Immunization History  Administered Date(s) Administered   Influenza, High Dose Seasonal PF 06/04/2023   Influenza-Unspecified 06/02/2014   Moderna Covid-19 Vaccine Bivalent Booster 34yrs & up 06/18/2023   PNEUMOCOCCAL CONJUGATE-20 02/11/2023   Tdap 08/25/2022   Zoster Recombinant(Shingrix) 06/18/2018, 08/28/2018   Pertinent  Health Maintenance Due  Topic Date Due   OPHTHALMOLOGY EXAM  Never done   HEMOGLOBIN A1C  08/01/2023   FOOT EXAM  07/24/2024   INFLUENZA VACCINE  Completed      03/28/2019    8:25 AM 03/22/2022   11:09 AM 08/06/2022    9:18 PM 08/25/2022    6:51 PM 02/04/2023    4:10 PM  Fall Risk  Falls in the past year?     1  Was there an injury with Fall?     1  Fall Risk Category Calculator     3  (RETIRED) Patient Fall Risk Level High fall risk Moderate fall risk Low fall risk Low fall risk   Patient at Risk for Falls Due to     History of fall(s);Impaired balance/gait;Impaired mobility  Fall risk Follow up     Falls evaluation completed   Functional Status Survey:    Vitals:   08/04/23 1337  BP: 102/67  Pulse: 80  Resp: 18  Temp: 97.7 F (36.5 C)  SpO2: 95%  Weight: 148 lb 3.2 oz (67.2 kg)   Body mass index is 21.89 kg/m. Physical Exam Vitals and nursing note reviewed.  Constitutional:      Appearance: Normal appearance.  HENT:     Head: Normocephalic and atraumatic.     Nose: Nose normal.     Mouth/Throat:     Mouth: Mucous membranes are moist.     Comments:   Eyes:     Extraocular Movements: Extraocular movements intact.     Conjunctiva/sclera: Conjunctivae normal.     Pupils: Pupils are  equal, round, and reactive to light.  Cardiovascular:     Rate and Rhythm: Normal rate and regular rhythm.     Heart sounds: No murmur heard. Pulmonary:     Effort: Pulmonary effort is normal.     Breath sounds: No rales.  Abdominal:     General: Bowel sounds are normal.     Palpations: Abdomen is soft.     Tenderness: There is no abdominal tenderness.  Musculoskeletal:        General: No tenderness.     Cervical back: Normal range of motion and neck supple.     Right lower leg: Edema present.     Left lower leg: Edema present.     Comments: Trace edema in ankles.   Skin:    General: Skin is warm and dry.     Comments: the lateral right foot open wound about a dime sized, punched out appearance, gray wound bed, near healed.    Neurological:     General: No focal deficit present.     Mental Status: He is alert and oriented to person, place, and time. Mental status is at baseline.     Gait: Gait abnormal.  Psychiatric:     Comments: Restless at night reportd Smiled, answered questions, followed directions during my examination today.  Labs reviewed: Recent Labs    08/25/22 1848 01/29/23 1606 01/30/23 0508 02/11/23 0000  NA 137 134* 136 139  K 4.3 4.7 3.6 4.6  CL 104 102 108 107  CO2 25 22 20* 25*  GLUCOSE 171* 211* 135*  --   BUN 25* 33* 26* 18  CREATININE 0.86 0.98 0.88 1.0  CALCIUM 9.7 9.5 8.5* 8.8  MG  --   --  2.1  --   PHOS  --   --  3.4  --    Recent Labs    08/06/22 1933 01/29/23 1606 01/30/23 0508  AST 69* 30 22  ALT 66* 33 24  ALKPHOS 59 42 33*  BILITOT 1.1 1.3* 1.2  PROT 6.5 7.4 5.6*  ALBUMIN 3.6 3.9 2.9*   Recent Labs    08/25/22 1848 01/29/23 1606 01/30/23 0508 02/11/23 0000 06/17/23 0000  WBC 5.2 8.7 5.9 3.8 4.3  NEUTROABS  --  7.2  --   --  2,378.00  HGB 13.0 13.3 10.2* 10.6* 13.3*  HCT 40.8 41.1 31.1* 32* 42  MCV 100.2* 97.4 96.9  --   --   PLT 161 161 109* 192 159   Lab Results  Component Value Date   TSH 0.885 03/23/2019    Lab Results  Component Value Date   HGBA1C 7.8 (H) 01/29/2023   Lab Results  Component Value Date   CHOL 121 02/20/2023   HDL 43 02/20/2023   LDLCALC 56 02/20/2023   TRIG 134 02/20/2023   CHOLHDL 2.6 12/10/2013    Significant Diagnostic Results in last 30 days:  No results found.  Assessment/Plan: Insomnia restless at night, worsened recently, reported yelling at night, Lorazepam x1 was effective. Start Mirtazapine 7.5mg  po qsh, prn Lorazepam 0.5mg  hs prn x 14 days, change Norco to 5/325mg  1/2 tab bid prn, update CBC/diff, CMP/eGFR, Hgb A1c, TSH.      Osteoarthritis, multiple sites Chronic left arm pain, up Norco 5/325mg  1/2 bid 07/25/23 for better pain control, ? Contributory to restless night, no pain complained today. Change Norco 5/325mg  1/2 tab bid prn.   Hypotension Normalized.   Blood loss anemia Hgb 13.3 06/17/23  Type 2 diabetes mellitus (HCC) Hgb A1c 8.2 01/02/23>>7.8 01/29/23 since started Meformin, Jardiance, Bun/creat 18/0.97 02/11/23  Urinary frequency Hx of bladder neoplasm,  2-3x nocturnal urination, on Tamsulosin. 02/05/23 Urology resume Plavix. Cysto 3 months. BCG #1of 3(immuno therapy)  Two-vessel CAD status post CABG x2;  post Op afib, takes Plavix, Jardiance, and statin  Paroxysmal tachycardia (HCC) Heart rate is in control,  02/12/23 off Metoprolol 2/2 low Bp    Family/ staff Communication: plan of care reviewed with the patient and charge nurse.   Labs/tests ordered:  CBC/diff, CMP/eGFR, Hgb A1c, TSH.    Time spend 40 minutes.

## 2023-08-04 NOTE — Assessment & Plan Note (Signed)
restless at night, worsened recently, reported yelling at night, Lorazepam x1 was effective. Start Mirtazapine 7.5mg  po qsh, prn Lorazepam 0.5mg  hs prn x 14 days, change Norco to 5/325mg  1/2 tab bid prn, update CBC/diff, CMP/eGFR, Hgb A1c, TSH.

## 2023-08-05 LAB — COMPREHENSIVE METABOLIC PANEL
Albumin: 3.8 (ref 3.5–5.0)
Calcium: 9.2 (ref 8.7–10.7)
Globulin: 2.5

## 2023-08-05 LAB — BASIC METABOLIC PANEL
BUN: 23 — AB (ref 4–21)
CO2: 27 — AB (ref 13–22)
Chloride: 105 (ref 99–108)
Creatinine: 0.8 (ref 0.6–1.3)
Glucose: 133
Potassium: 4.2 meq/L (ref 3.5–5.1)
Sodium: 139 (ref 137–147)

## 2023-08-05 LAB — HEPATIC FUNCTION PANEL
ALT: 43 U/L — AB (ref 10–40)
AST: 37 (ref 14–40)
Alkaline Phosphatase: 32 (ref 25–125)
Bilirubin, Total: 1.1

## 2023-08-05 LAB — CBC AND DIFFERENTIAL
HCT: 41 (ref 41–53)
Hemoglobin: 13.1 — AB (ref 13.5–17.5)
Neutrophils Absolute: 2408
Platelets: 145 10*3/uL — AB (ref 150–400)
WBC: 4.3

## 2023-08-05 LAB — TSH: TSH: 1.89 (ref 0.41–5.90)

## 2023-08-05 LAB — HEMOGLOBIN A1C: Hemoglobin A1C: 8

## 2023-08-05 LAB — CBC: RBC: 4.37 (ref 3.87–5.11)

## 2023-08-21 ENCOUNTER — Non-Acute Institutional Stay (SKILLED_NURSING_FACILITY): Payer: Medicare Other | Admitting: Sports Medicine

## 2023-08-21 ENCOUNTER — Encounter: Payer: Self-pay | Admitting: Sports Medicine

## 2023-08-21 DIAGNOSIS — R2689 Other abnormalities of gait and mobility: Secondary | ICD-10-CM

## 2023-08-21 DIAGNOSIS — W19XXXA Unspecified fall, initial encounter: Secondary | ICD-10-CM

## 2023-08-21 DIAGNOSIS — Z66 Do not resuscitate: Secondary | ICD-10-CM

## 2023-08-21 NOTE — Progress Notes (Addendum)
Location:  [facility] Room Number: [id number] Place of Service:  ALF [phone]) Provider:  Willey Blade, MD  Patient Care Team: [name], MD as PCP - General (Internal Medicine) [name], MD as PCP - Cardiology (Cardiology)  Extended Emergency Contact Information Primary Emergency Contact: [name],[name] Mobile Phone: [phone] Relation: Nephew Preferred language: English Interpreter needed? No Secondary Emergency Contact: wright,[name] Mobile Phone: [phone] Relation: Niece  Code Status:  DNR Goals of care: Advanced Directive information    08/21/2023   11:33 AM  Advanced Directives  Does Patient Have a Medical Advance Directive? Yes  Type of Advance Directive Out of facility DNR (pink MOST or yellow form)  Does patient want to make changes to medical advance directive? No - Patient declined  Pre-existing out of facility DNR order (yellow form or pink MOST form) Yellow form placed in chart (order not valid for inpatient use)     Chief Complaint  Patient presents with   Acute Visit    Fall    HPI:  Pt is a 89 y.o. male seen today for an acute visit for unwitnessed fall.As per staff patient was found on the floor next to the bed.  Patient seen and examined in his room.  He states that he got up from his bed to go to the bathroom slipped and fell on the floor denies hitting his bed.  Patient denies headache, nausea, vomiting, dizzy or lightheadedness.  He has history of unsteady gait and uses walker for ambulation and transferring. Patient denies pain and is able to move all his extremities.   Past Medical History:  Diagnosis Date   Anticoagulant long-term use    plavix  (for hx TIA)   Arthritis    right shoulder    Benign essential tremor    hands -- occasional   CAD S/P percutaneous coronary angioplasty cardiologist-- dr harding   a) PCI and BMS to LAD in 1991; b) CATH 10/2010: Severe 2 Vessel disease = diffuse RCA ( 75%  prox, 90% mid & 70 + 75% distal) & LAD (80-90% pre-stent, 75% post-stent) with occluded D1 (filled via OM-D1 collaterals)  -- CABG x 2; c) Myoview 4/'15: INTERMEDIATE RISK - small sized reversible apical/anterolateral defect -- most likely c/w known occlusion of D1 with collaterals.(med Rx)   History of basal cell carcinoma (BCC) excision    History of bladder cancer urologist-- dr eskridge   dx 11/ 2015-- s/p TURBT   History of kidney stones    History of TIA (transient ischemic attack) 12/09/2013   History of urethral stricture    Hyperlipidemia with target LDL less than 70    controlled.   Neuropathy of lower extremity    Nocturia    Osteoporosis    Peripheral neuropathy    lower extremity   Postoperative atrial fibrillation (HCC) 10/2010   after bypass-"shocked back in and no problems since":   per cardiologist note, [name], no recurrence   Pre-diabetes    S/P CABG x 2 10/18/2010   LIMA-LAD, SVG to RPDA (Dr. Cornelius Moras)   TGA (transient global amnesia)    "x3 for hours then goes away"   Transient Dilated idiopathic cardiomyopathy April 2015; 04/2014   a) in setting of TIA: EF 35-40% (reduced from 55-65 percent) mild AI, moderate and dilated aorta.;; b) Recheck Echo 04/2014: EF 55-60%, Gr 1 DD, mild-mod AI.     Wears glasses    Wears partial dentures    upper   Past Surgical  History:  Procedure Laterality Date   APPENDECTOMY  as teen   BASAL CELL CARCINOMA EXCISION  07/2014   CARDIAC CATHETERIZATION  10/17/2010   80%prox w/mild in-stent restenosis w/75% mid-LADstenosis aft the stent,multi severe stenoses RCAw/heavily calcified vessel,norm LV   CARDIOVERSION  2012   after bypass surgery   CATARACT EXTRACTION W/ INTRAOCULAR LENS  IMPLANT, BILATERAL  2009  approx.   COCCYX REMOVAL     CORONARY ANGIOPLASTY WITH STENT PLACEMENT  1991   PCI and BMS to LAD   CORONARY ARTERY BYPASS GRAFT  10-18-2010  [name] @[facility]    LIMA-LAD, SVG-rPDA   CYSTOSCOPY W/ RETROGRADES N/A 07/12/2014    Procedure: CYSTOSCOPY WITH RETROGRADE PYELOGRAM;  Surgeon: [name], MD;  Location: [facility];  Service: Urology;  Laterality: N/A;   CYSTOSCOPY WITH BIOPSY N/A 09/09/2014   Procedure: CYSTO WITH BIOPSY AND FULGERATION, DILATION OF URETHRAL STRICTURE;  Surgeon: [name], MD;  Location: [facility];  Service: Urology;  Laterality: N/A;  BLADDER BIOPSY   CYSTOSCOPY WITH BIOPSY N/A 01/03/2015   Procedure: CYSTOSCOPY WITH BIOPSY;  Surgeon: [name], MD;  Location: [facility];  Service: Urology;  Laterality: N/A;   CYSTOSCOPY WITH BIOPSY N/A 01/26/2019   Procedure: CYSTOSCOPY WITH BIOPSY, BILATERAL RETROGRADE PYELOGRAM;  Surgeon: [name], MD;  Location: [facility];  Service: Urology;  Laterality: N/A;   CYSTOSCOPY WITH FULGERATION N/A 01/26/2019   Procedure: CYSTOSCOPY WITH FULGERATION;  Surgeon: [name], MD;  Location: [facility];  Service: Urology;  Laterality: N/A;   CYSTOSCOPY WITH URETHRAL DILATATION N/A 01/03/2015   Procedure: CYSTOSCOPY WITH URETHRAL DILATATION;  Surgeon: [name], MD;  Location: [facility];  Service: Urology;  Laterality: N/A;   DOPPLER ECHOCARDIOGRAPHY  10/18/2004   EF 55-65%,BORDERLINE -enlarged aortic root,mild aortic insuff.,trace tricus.insuff.   FINGER SURGERY Left 03-17-2001   dr [name]  @[facility]    repair complex laceration injury 5th digit   KIDNEY STONE SURGERY  1989   NM MYOVIEW LTD  12/16/2013   INTERMEDIATE RISK test with small sized reversible perfusion defect in the apical anterolateral wall -- most likely consistent with known occlusion of D1 with collaterals.   shoulder surgery Right 1988   TONSILLECTOMY  as teen   TRANSTHORACIC ECHOCARDIOGRAM  April 2015   EF 35-40%, mild AI, moderately dilated ascending aorta   TRANSURETHRAL RESECTION OF BLADDER TUMOR Right 06/11/2022   Procedure: TRANSURETHRAL RESECTION OF BLADDER TUMOR (TURBT)  >5cm;  Surgeon: [name], MD;  Location: [facility];  Service:  Urology;  Laterality: Right;   TRANSURETHRAL RESECTION OF PROSTATE N/A 07/12/2014   Procedure: TRANSURETHRAL RESECTION OF THE PROSTATE (TURP) WITH MITOMYCIN -C;  Surgeon: [name], MD;  Location: [facility];  Service: Urology;  Laterality: N/A;   TRANSURETHRAL RESECTION OF PROSTATE N/A 06/11/2022   Procedure: TRANSURETHRAL RESECTION OF THE PROSTATE (TURP);  Surgeon: [name], MD;  Location: [facility];  Service: Urology;  Laterality: N/A;  REQUESTING 2 HRS    No Known Allergies  Outpatient Encounter Medications as of 08/21/2023  Medication Sig   acetaminophen (TYLENOL) 500 MG tablet Take 1,000 mg by mouth every 6 (six) hours as needed for mild pain.   calcium elemental as carbonate (CALCIUM ANTACID ULTRA STRENGTH) 400 MG chewable tablet Chew 1,000 mg by mouth daily.   ezetimibe (ZETIA) 10 MG tablet Take 10 mg by mouth daily.   fish oil-omega-3 fatty acids 1000 MG capsule Take 1 g by mouth daily.    gabapentin (NEURONTIN) 100 MG capsule Take 100  mg by mouth 2 (two) times daily.   HYDROcodone-acetaminophen (NORCO/VICODIN) 5-325 MG tablet Take 0.5 tablets by mouth every 12 (twelve) hours as needed for moderate pain (pain score 4-6).   JARDIANCE 10 MG TABS tablet Take 10 mg by mouth daily.   latanoprost (XALATAN) 0.005 % ophthalmic solution Place 1 drop into both eyes at bedtime.   metFORMIN (GLUCOPHAGE) 500 MG tablet Take 500 mg by mouth daily.   mirtazapine (REMERON) 7.5 MG tablet Take 7.5 mg by mouth at bedtime.   rosuvastatin (CRESTOR) 40 MG tablet Take 40 mg by mouth daily.   tamsulosin (FLOMAX) 0.4 MG CAPS capsule Take 0.4 mg by mouth at bedtime.   Cholecalciferol (VITAMIN D3) 50 MCG (2000 UT) CAPS Take 2,000 Units by mouth daily.   clopidogrel (PLAVIX) 75 MG tablet Take 75 mg by mouth daily.   Cyanocobalamin (VITAMIN B12 PO) Place 5,000 mcg under the tongue daily.   denosumab (PROLIA) 60 MG/ML SOSY injection Inject 60 mg into the skin every 6 (six) months.   diclofenac Sodium  (VOLTAREN) 1 % GEL Apply topically 4 (four) times daily.   HYDROcodone-acetaminophen (NORCO) 5-325 MG tablet Take 0.5 tablets by mouth daily. (Patient not taking: Reported on 08/21/2023)   Multiple Vitamin (MULTIVITAMIN) capsule Take 1 capsule by mouth at bedtime.    mupirocin ointment (BACTROBAN) 2 % 1 Application daily. Apply to Right lateral foot wound topically every evening shift for right lateral foot wound Clean with wound cleanser daily, dry, apply Mupirocin ointment and cover with Mepilex   timolol (TIMOPTIC) 0.5 % ophthalmic solution Place 1 drop into both eyes every morning.   Zinc Oxide 10 % OINT Apply 1 Application topically as needed (Apply to right buttocks.).   Facility-Administered Encounter Medications as of 08/21/2023  Medication   gemcitabine (GEMZAR) chemo syringe for bladder instillation 2,000 mg    Review of Systems  Constitutional:  Negative for fever.  HENT:  Negative for sore throat.   Respiratory:  Negative for cough, shortness of breath and wheezing.   Cardiovascular:  Negative for chest pain.  Gastrointestinal:  Negative for abdominal distention, abdominal pain, blood in stool, constipation and diarrhea.  Genitourinary:  Negative for dysuria.  Neurological:  Negative for dizziness.    Immunization History  Administered Date(s) Administered   Influenza, High Dose Seasonal PF 06/04/2023   Influenza-Unspecified 06/02/2014   Moderna Covid-19 Vaccine Bivalent Booster 71yrs & up 06/18/2023   PNEUMOCOCCAL CONJUGATE-20 02/11/2023   Tdap 08/25/2022   Zoster Recombinant(Shingrix) 06/18/2018, 08/28/2018   Pertinent  Health Maintenance Due  Topic Date Due   OPHTHALMOLOGY EXAM  Never done   HEMOGLOBIN A1C  02/03/2024   FOOT EXAM  07/24/2024   INFLUENZA VACCINE  Completed      03/28/2019    8:25 AM 03/22/2022   11:09 AM 08/06/2022    9:18 PM 08/25/2022    6:51 PM 02/04/2023    4:10 PM  Fall Risk  Falls in the past year?     1  Was there an injury with Fall?      1  Fall Risk Category Calculator     3  (RETIRED) Patient Fall Risk Level High fall risk Moderate fall risk Low fall risk Low fall risk   Patient at Risk for Falls Due to     History of fall(s);Impaired balance/gait;Impaired mobility  Fall risk Follow up     Falls evaluation completed   Functional Status Survey:    Vitals:   08/21/23 1131  BP: 135/88  Pulse: 87  Resp: 18  Temp: 97.7 F (36.5 C)  SpO2: 96%  Weight: 151 lb 12.8 oz (68.9 kg)  Height: 5\' 9"  (1.753 m)   Body mass index is 22.42 kg/m. Physical Exam Constitutional:      Appearance: Normal appearance.  HENT:     Head: Normocephalic and atraumatic.  Cardiovascular:     Rate and Rhythm: Normal rate and regular rhythm.     Pulses: Normal pulses.     Heart sounds: Normal heart sounds.  Pulmonary:     Effort: No respiratory distress.     Breath sounds: No stridor. No wheezing or rales.  Abdominal:     General: Bowel sounds are normal. There is no distension.     Palpations: Abdomen is soft.     Tenderness: There is no abdominal tenderness. There is no right CVA tenderness or guarding.  Neurological:     Mental Status: He is alert. Mental status is at baseline.     Motor: No weakness.     Labs reviewed: Recent Labs    08/25/22 1848 01/29/23 1606 01/30/23 0508 02/11/23 0000 08/05/23 0000  NA 137 134* 136 139 139  K 4.3 4.7 3.6 4.6 4.2  CL 104 102 108 107 105  CO2 25 22 20* 25* 27*  GLUCOSE 171* 211* 135*  --   --   BUN 25* 33* 26* 18 23*  CREATININE 0.86 0.98 0.88 1.0 0.8  CALCIUM 9.7 9.5 8.5* 8.8 9.2  MG  --   --  2.1  --   --   PHOS  --   --  3.4  --   --    Recent Labs    01/29/23 1606 01/30/23 0508 08/05/23 0000  AST 30 22 37  ALT 33 24 43*  ALKPHOS 42 33* 32  BILITOT 1.3* 1.2  --   PROT 7.4 5.6*  --   ALBUMIN 3.9 2.9* 3.8   Recent Labs    08/25/22 1848 01/29/23 1606 01/30/23 0508 02/11/23 0000 06/17/23 0000 08/05/23 0000  WBC 5.2 8.7 5.9 3.8 4.3 4.3  NEUTROABS  --  7.2  --    --  2,378.00 2,408.00  HGB 13.0 13.3 10.2* 10.6* 13.3* 13.1*  HCT 40.8 41.1 31.1* 32* 42 41  MCV 100.2* 97.4 96.9  --   --   --   PLT 161 161 109* 192 159 145*   Lab Results  Component Value Date   TSH 1.89 08/05/2023   Lab Results  Component Value Date   HGBA1C 8.0 08/05/2023   Lab Results  Component Value Date   CHOL 121 02/20/2023   HDL 43 02/20/2023   LDLCALC 56 02/20/2023   TRIG 134 02/20/2023   CHOLHDL 2.6 12/10/2013    Significant Diagnostic Results in last 30 days:  No results found.  Assessment/Plan Balance problems  Unwitnessed fall Patient is at baseline, no change in mentation Denies pain. Continue the neurochecks Denies pain Instructed staff to monitor frequently Fall precautions discussed    Family/ staff Communication: care plan discussed with the nursing staff   30 minTotal time spent for obtaining history,  performing a medically appropriate examination and evaluation, reviewing the tests,ordering  tests,  documenting clinical information in the electronic or other health record, independently interpreting results ,care coordination (not separately reported)

## 2023-08-22 ENCOUNTER — Encounter: Payer: Self-pay | Admitting: Sports Medicine

## 2023-09-05 ENCOUNTER — Encounter: Payer: Self-pay | Admitting: Sports Medicine

## 2023-09-05 ENCOUNTER — Non-Acute Institutional Stay: Payer: Medicare Other | Admitting: Sports Medicine

## 2023-09-05 DIAGNOSIS — I2510 Atherosclerotic heart disease of native coronary artery without angina pectoris: Secondary | ICD-10-CM

## 2023-09-05 DIAGNOSIS — R2689 Other abnormalities of gait and mobility: Secondary | ICD-10-CM

## 2023-09-05 DIAGNOSIS — D494 Neoplasm of unspecified behavior of bladder: Secondary | ICD-10-CM

## 2023-09-05 DIAGNOSIS — F411 Generalized anxiety disorder: Secondary | ICD-10-CM

## 2023-09-05 DIAGNOSIS — F5101 Primary insomnia: Secondary | ICD-10-CM

## 2023-09-05 DIAGNOSIS — E1169 Type 2 diabetes mellitus with other specified complication: Secondary | ICD-10-CM | POA: Diagnosis not present

## 2023-09-05 DIAGNOSIS — M150 Primary generalized (osteo)arthritis: Secondary | ICD-10-CM

## 2023-09-05 NOTE — Progress Notes (Signed)
 Location:  [facility] Room Number: 926-A Place of Service:  ALF [phone]) Provider:  Jackalyn Sherlynn ROLLA Clarice Ryan, MD  Patient Care Team: Clarice Ryan, MD as PCP - General (Internal Medicine) Anner Alm ORN, MD as PCP - Cardiology (Cardiology)  Extended Emergency Contact Information Primary Emergency Contact: [name],[name] Mobile Phone: [phone] Relation: Nephew Preferred language: English Interpreter needed? No Secondary Emergency Contact: wright,[name] Mobile Phone: [phone] Relation: Niece  Code Status:  DNR Goals of care: Advanced Directive information    09/05/2023    3:47 PM  Advanced Directives  Does Patient Have a Medical Advance Directive? Yes  Type of Advance Directive Out of facility DNR (pink MOST or yellow form)  Does patient want to make changes to medical advance directive? No - Patient declined  Pre-existing out of facility DNR order (yellow form or pink MOST form) Yellow form placed in chart (order not valid for inpatient use)     Chief Complaint  Patient presents with   Routine    HPI:  Pt is a 89 y.o. male seen today for medical management of chronic diseases.   Pt seen and examined in his room, care taker at bedside Pt has no acute concerns, as per nursing staff pt gets agitated occasionally at night time He is currently on ativan 0.25 mg prn  Pt ambulates with power scooter for long distances and uses walker in his room  Pt reports problems with his balance and is interested in trying physical therapy   Pt has tiny open wound on his Rt foot  Denies pain  No drainage or erythema noted   H/O Bladder neoplasm  Followed with urology in June 2024  Denies hematuria   DM  Lab Results  Component Value Date   HGBA1C 8.0 08/05/2023   HGBA1C 7.8 (H) 01/29/2023   HGBA1C 6.3 (H) 06/05/2022   On metformin, jardiance     Past Medical History:  Diagnosis Date   Anticoagulant long-term use    plavix   (for hx  TIA)   Arthritis    right shoulder    Benign essential tremor    hands -- occasional   CAD S/P percutaneous coronary angioplasty cardiologist-- dr harding   a) PCI and BMS to LAD in 1991; b) CATH 10/2010: Severe 2 Vessel disease = diffuse RCA ( 75% prox, 90% mid & 70 + 75% distal) & LAD (80-90% pre-stent, 75% post-stent) with occluded D1 (filled via OM-D1 collaterals)  -- CABG x 2; c) Myoview  4/'15: INTERMEDIATE RISK - small sized reversible apical/anterolateral defect -- most likely c/w known occlusion of D1 with collaterals.(med Rx)   History of basal cell carcinoma (BCC) excision    History of bladder cancer urologist-- dr eskridge   dx 11/ 2015-- s/p TURBT   History of kidney stones    History of TIA (transient ischemic attack) 12/09/2013   History of urethral stricture    Hyperlipidemia with target LDL less than 70    controlled.   Neuropathy of lower extremity    Nocturia    Osteoporosis    Peripheral neuropathy    lower extremity   Postoperative atrial fibrillation (HCC) 10/2010   after bypass-shocked back in and no problems since:   per cardiologist note, dr harding, no recurrence   Pre-diabetes    S/P CABG x 2 10/18/2010   LIMA-LAD, SVG to RPDA (Dr. Dusty)   TGA (transient global amnesia)    x3 for hours then goes away   Transient Dilated idiopathic  cardiomyopathy April 2015; 04/2014   a) in setting of TIA: EF 35-40% (reduced from 55-65 percent) mild AI, moderate and dilated aorta.;; b) Recheck Echo 04/2014: EF 55-60%, Gr 1 DD, mild-mod AI.     Wears glasses    Wears partial dentures    upper   Past Surgical History:  Procedure Laterality Date   APPENDECTOMY  as teen   BASAL CELL CARCINOMA EXCISION  07/2014   CARDIAC CATHETERIZATION  10/17/2010   80%prox w/mild in-stent restenosis w/75% mid-LADstenosis aft the stent,multi severe stenoses RCAw/heavily calcified vessel,norm LV   CARDIOVERSION  2012   after bypass surgery   CATARACT EXTRACTION W/ INTRAOCULAR LENS   IMPLANT, BILATERAL  2009  approx.   COCCYX REMOVAL     CORONARY ANGIOPLASTY WITH STENT PLACEMENT  1991   PCI and BMS to LAD   CORONARY ARTERY BYPASS GRAFT  10-18-2010  dr quin @[facility]    LIMA-LAD, SVG-rPDA   CYSTOSCOPY W/ RETROGRADES N/A 07/12/2014   Procedure: CYSTOSCOPY WITH RETROGRADE PYELOGRAM;  Surgeon: Donnice Brooks, MD;  Location: [facility];  Service: Urology;  Laterality: N/A;   CYSTOSCOPY WITH BIOPSY N/A 09/09/2014   Procedure: CYSTO WITH BIOPSY AND FULGERATION, DILATION OF URETHRAL STRICTURE;  Surgeon: Donnice Brooks, MD;  Location: [facility];  Service: Urology;  Laterality: N/A;  BLADDER BIOPSY   CYSTOSCOPY WITH BIOPSY N/A 01/03/2015   Procedure: CYSTOSCOPY WITH BIOPSY;  Surgeon: Donnice Brooks, MD;  Location: [facility];  Service: Urology;  Laterality: N/A;   CYSTOSCOPY WITH BIOPSY N/A 01/26/2019   Procedure: CYSTOSCOPY WITH BIOPSY, BILATERAL RETROGRADE PYELOGRAM;  Surgeon: Brooks Donnice, MD;  Location: [facility];  Service: Urology;  Laterality: N/A;   CYSTOSCOPY WITH FULGERATION N/A 01/26/2019   Procedure: CYSTOSCOPY WITH FULGERATION;  Surgeon: Brooks Donnice, MD;  Location: [facility];  Service: Urology;  Laterality: N/A;   CYSTOSCOPY WITH URETHRAL DILATATION N/A 01/03/2015   Procedure: CYSTOSCOPY WITH URETHRAL DILATATION;  Surgeon: Donnice Brooks, MD;  Location: [facility];  Service: Urology;  Laterality: N/A;   DOPPLER ECHOCARDIOGRAPHY  10/18/2004   EF 55-65%,BORDERLINE -enlarged aortic root,mild aortic insuff.,trace tricus.insuff.   FINGER SURGERY Left 03-17-2001   dr sissy  @[facility]    repair complex laceration injury 5th digit   KIDNEY STONE SURGERY  1989   NM MYOVIEW  LTD  12/16/2013   INTERMEDIATE RISK test with small sized reversible perfusion defect in the apical anterolateral wall -- most likely consistent with known occlusion of D1 with collaterals.   shoulder surgery Right 1988   TONSILLECTOMY  as teen   TRANSTHORACIC ECHOCARDIOGRAM  April 2015    EF 35-40%, mild AI, moderately dilated ascending aorta   TRANSURETHRAL RESECTION OF BLADDER TUMOR Right 06/11/2022   Procedure: TRANSURETHRAL RESECTION OF BLADDER TUMOR (TURBT)  >5cm;  Surgeon: Brooks Donnice, MD;  Location: [facility];  Service: Urology;  Laterality: Right;   TRANSURETHRAL RESECTION OF PROSTATE N/A 07/12/2014   Procedure: TRANSURETHRAL RESECTION OF THE PROSTATE (TURP) WITH MITOMYCIN  -C;  Surgeon: Donnice Brooks, MD;  Location: [facility];  Service: Urology;  Laterality: N/A;   TRANSURETHRAL RESECTION OF PROSTATE N/A 06/11/2022   Procedure: TRANSURETHRAL RESECTION OF THE PROSTATE (TURP);  Surgeon: Brooks Donnice, MD;  Location: [facility];  Service: Urology;  Laterality: N/A;  REQUESTING 2 HRS    No Known Allergies  Outpatient Encounter Medications as of 09/05/2023  Medication Sig   acetaminophen  (TYLENOL ) 500 MG tablet Take 1,000 mg by mouth every 6 (six) hours as needed for mild pain.   calcium  elemental  as carbonate (CALCIUM  ANTACID ULTRA STRENGTH) 400 MG chewable tablet Chew 1,000 mg by mouth daily.   Cholecalciferol  (VITAMIN D3) 50 MCG (2000 UT) CAPS Take 2,000 Units by mouth daily.   clopidogrel  (PLAVIX ) 75 MG tablet Take 75 mg by mouth daily.   Cyanocobalamin  (VITAMIN B12 PO) Place 5,000 mcg under the tongue daily.   denosumab (PROLIA) 60 MG/ML SOSY injection Inject 60 mg into the skin every 6 (six) months.   diclofenac Sodium (VOLTAREN) 1 % GEL Apply topically 4 (four) times daily.   ezetimibe  (ZETIA ) 10 MG tablet Take 10 mg by mouth daily.   fish oil-omega-3 fatty acids  1000 MG capsule Take 1 g by mouth daily.    gabapentin  (NEURONTIN ) 100 MG capsule Take 100 mg by mouth 2 (two) times daily.   HYDROcodone -acetaminophen  (NORCO/VICODIN) 5-325 MG tablet Take 0.5 tablets by mouth every 12 (twelve) hours as needed for moderate pain (pain score 4-6).   JARDIANCE 10 MG TABS tablet Take 10 mg by mouth daily.   latanoprost (XALATAN) 0.005 % ophthalmic solution Place 1 drop into  both eyes at bedtime.   LORazepam (ATIVAN) 0.5 MG tablet Take 0.25 mg by mouth as needed for anxiety.   metFORMIN (GLUCOPHAGE) 500 MG tablet Take 500 mg by mouth daily.   mirtazapine (REMERON) 7.5 MG tablet Take 7.5 mg by mouth at bedtime.   Multiple Vitamin (MULTIVITAMIN) capsule Take 1 capsule by mouth at bedtime.    mupirocin  ointment (BACTROBAN ) 2 % 1 Application daily. Apply to Right lateral foot wound topically every evening shift for right lateral foot wound Clean with wound cleanser daily, dry, apply Mupirocin  ointment and cover with Mepilex   rosuvastatin  (CRESTOR ) 40 MG tablet Take 40 mg by mouth daily.   tamsulosin  (FLOMAX ) 0.4 MG CAPS capsule Take 0.4 mg by mouth at bedtime.   timolol (TIMOPTIC) 0.5 % ophthalmic solution Place 1 drop into both eyes every morning.   Zinc Oxide 10 % OINT Apply 1 Application topically as needed (Apply to right buttocks.).   HYDROcodone -acetaminophen  (NORCO) 5-325 MG tablet Take 0.5 tablets by mouth daily. (Patient not taking: Reported on 09/05/2023)   Facility-Administered Encounter Medications as of 09/05/2023  Medication   gemcitabine  (GEMZAR ) chemo syringe for bladder instillation 2,000 mg    Review of Systems  Immunization History  Administered Date(s) Administered   Influenza, High Dose Seasonal PF 06/04/2023   Influenza-Unspecified 06/02/2014   Moderna Covid-19 Vaccine Bivalent Booster 58yrs & up 06/18/2023   PNEUMOCOCCAL CONJUGATE-20 02/11/2023   Tdap 08/25/2022   Zoster Recombinant(Shingrix) 06/18/2018, 08/28/2018   Pertinent  Health Maintenance Due  Topic Date Due   OPHTHALMOLOGY EXAM  Never done   HEMOGLOBIN A1C  02/03/2024   FOOT EXAM  07/24/2024   INFLUENZA VACCINE  Completed      03/28/2019    8:25 AM 03/22/2022   11:09 AM 08/06/2022    9:18 PM 08/25/2022    6:51 PM 02/04/2023    4:10 PM  Fall Risk  Falls in the past year?     1  Was there an injury with Fall?     1  Fall Risk Category Calculator     3  (RETIRED) Patient  Fall Risk Level High fall risk Moderate fall risk Low fall risk Low fall risk   Patient at Risk for Falls Due to     History of fall(s);Impaired balance/gait;Impaired mobility  Fall risk Follow up     Falls evaluation completed   Functional Status Survey:    Vitals:  09/05/23 1546  BP: 101/60  Pulse: 87  Resp: 20  Temp: (!) 97.5 F (36.4 C)  SpO2: 94%  Weight: 151 lb 12.8 oz (68.9 kg)  Height: 5' 9 (1.753 m)   Body mass index is 22.42 kg/m. Physical Exam  Labs reviewed: Recent Labs    01/29/23 1606 01/30/23 0508 02/11/23 0000 08/05/23 0000  NA 134* 136 139 139  K 4.7 3.6 4.6 4.2  CL 102 108 107 105  CO2 22 20* 25* 27*  GLUCOSE 211* 135*  --   --   BUN 33* 26* 18 23*  CREATININE 0.98 0.88 1.0 0.8  CALCIUM  9.5 8.5* 8.8 9.2  MG  --  2.1  --   --   PHOS  --  3.4  --   --    Recent Labs    01/29/23 1606 01/30/23 0508 08/05/23 0000  AST 30 22 37  ALT 33 24 43*  ALKPHOS 42 33* 32  BILITOT 1.3* 1.2  --   PROT 7.4 5.6*  --   ALBUMIN 3.9 2.9* 3.8   Recent Labs    01/29/23 1606 01/30/23 0508 02/11/23 0000 06/17/23 0000 08/05/23 0000  WBC 8.7 5.9 3.8 4.3 4.3  NEUTROABS 7.2  --   --  2,378.00 2,408.00  HGB 13.3 10.2* 10.6* 13.3* 13.1*  HCT 41.1 31.1* 32* 42 41  MCV 97.4 96.9  --   --   --   PLT 161 109* 192 159 145*   Lab Results  Component Value Date   TSH 1.89 08/05/2023   Lab Results  Component Value Date   HGBA1C 8.0 08/05/2023   Lab Results  Component Value Date   CHOL 121 02/20/2023   HDL 43 02/20/2023   LDLCALC 56 02/20/2023   TRIG 134 02/20/2023   CHOLHDL 2.6 12/10/2013    Significant Diagnostic Results in last 30 days:  No results found.  Assessment/Plan  1. Balance problems (Primary) Pt reports problems with balance Had a fall last month  Will refer to PT   2. Atherosclerosis of native coronary artery of native heart without angina pectoris Denies chest pain  Cont with plavix , statin   3. Type 2 diabetes mellitus with  other specified complication, without long-term current use of insulin  (HCC) Lab Results  Component Value Date   HGBA1C 8.0 08/05/2023   HGBA1C 7.8 (H) 01/29/2023   HGBA1C 6.3 (H) 06/05/2022   Will increase jardiance to 25mg  daily  Cont with metformin     Latest Ref Rng & Units 08/05/2023   12:00 AM 02/11/2023   12:00 AM 01/30/2023    5:08 AM  BMP  Glucose 70 - 99 mg/dL   864   BUN 4 - 21 23     18     26    Creatinine 0.6 - 1.3 0.8     1.0     0.88   Sodium 137 - 147 139     139     136   Potassium 3.5 - 5.1 mEq/L 4.2     4.6     3.6   Chloride 99 - 108 105     107     108   CO2 13 - 22 27     25     20    Calcium  8.7 - 10.7 9.2     8.8     8.5      This result is from an external source.     4. Primary osteoarthritis involving  multiple joints Pt denies pain during the visit today Cont with norco prn   5. Bladder neoplasm Pt missed follow up appt, will refer to urology  6. Primary insomnia Cont with rameron   7. GAD (generalized anxiety disorder) Cont with ativan prn   Other orders - LORazepam (ATIVAN) 0.5 MG tablet; Take 0.25 mg by mouth as needed for anxiety.    Care plan discussed with the nursing staff    30 min Total time spent for obtaining history,  performing a medically appropriate examination and evaluation, reviewing the tests, ,  documenting clinical information in the electronic or other health record, independently interpreting results ,care coordination (not separately reported)

## 2023-09-08 ENCOUNTER — Encounter: Payer: Self-pay | Admitting: Sports Medicine

## 2023-11-13 DIAGNOSIS — K5901 Slow transit constipation: Secondary | ICD-10-CM | POA: Insufficient documentation

## 2023-12-12 ENCOUNTER — Encounter: Payer: Self-pay | Admitting: Nurse Practitioner

## 2023-12-12 ENCOUNTER — Non-Acute Institutional Stay: Payer: Self-pay | Admitting: Nurse Practitioner

## 2023-12-12 DIAGNOSIS — M150 Primary generalized (osteo)arthritis: Secondary | ICD-10-CM

## 2023-12-12 DIAGNOSIS — E559 Vitamin D deficiency, unspecified: Secondary | ICD-10-CM

## 2023-12-12 DIAGNOSIS — D500 Iron deficiency anemia secondary to blood loss (chronic): Secondary | ICD-10-CM | POA: Diagnosis not present

## 2023-12-12 DIAGNOSIS — I2510 Atherosclerotic heart disease of native coronary artery without angina pectoris: Secondary | ICD-10-CM | POA: Diagnosis not present

## 2023-12-12 DIAGNOSIS — E538 Deficiency of other specified B group vitamins: Secondary | ICD-10-CM

## 2023-12-12 DIAGNOSIS — R350 Frequency of micturition: Secondary | ICD-10-CM

## 2023-12-12 DIAGNOSIS — I959 Hypotension, unspecified: Secondary | ICD-10-CM

## 2023-12-12 DIAGNOSIS — G609 Hereditary and idiopathic neuropathy, unspecified: Secondary | ICD-10-CM

## 2023-12-12 DIAGNOSIS — E1169 Type 2 diabetes mellitus with other specified complication: Secondary | ICD-10-CM | POA: Diagnosis not present

## 2023-12-12 NOTE — Progress Notes (Signed)
 Location:   [facility] Room Number: 920 Place of Service:  ALF (13) Provider: Abner Hoffman Ainhoa Rallo NP  Imelda Brady Schiller, MD  Patient Care Team: Imelda Ricardo Kayes, MD as PCP - General (Internal Medicine) Arleen Lacer, MD as PCP - Cardiology (Cardiology)  Extended Emergency Contact Information Primary Emergency Contact: [name],[name] Mobile Phone: [phone] Relation: Nephew Preferred language: English Interpreter needed? No Secondary Emergency Contact: wright,[name] Mobile Phone: [phone] Relation: Niece  Code Status:  DNR Goals of care: Advanced Directive information    09/05/2023    3:47 PM  Advanced Directives  Does Patient Have a Medical Advance Directive? Yes  Type of Advance Directive Out of facility DNR (pink MOST or yellow form)  Does patient want to make changes to medical advance directive? No - Patient declined  Pre-existing out of facility DNR order (yellow form or pink MOST form) Yellow form placed in chart (order not valid for inpatient use)     Chief Complaint  Patient presents with   Medical Management of Chronic Issues    HPI:  Pt is a 89 y.o. male seen today for medical management of chronic diseases.     Chronic left arm pain, up Norco 5/325mg  1/2 bid 07/25/23 for better pain control, ? Contributory to restless night, no pain complained today.               The lateral right foot open wound about a dime sized, punched out appearance, gray wound bed, near healed.                    Bp controlled, off Metoprolol.  Hematuria, resolved, Plavix was resumed by Urology 02/12/23             Anemia, blood loss, Hgb 13.1 08/05/23 T2DM, Hgb A1c 8.2 01/02/23>>7.8 01/29/23 since started Meformin, Jardiance, Bun/creat 23/0.8 08/05/23             Urinary frequency, Hx of bladder neoplasm,  2-3x nocturnal urination, on Tamsulosin. 02/05/23 Urology resume Plavix. Cysto followup. BCG (immuno therapy)             CAD, GABG x2, post Op afib, takes Plavix, Jardiance, and  statin             Vitamin D deficiency, on Vit D3, Vit D 51 01/02/23             Vit B12 deficiency, on Vit B12, Vit B12 877             Peripheral neuropathy, on Gabapentin, ambulates with walker.  Glaucoma, wear glasses, on Timolol, Latanoprost HLD on Zetia, Fish oil, Rosuvastatin, LDL 56 02/20/23 Paroxysmal tachycardia, 02/12/23 off Metoprolol 2/2 low Bp,  TSH 2.91 01/02/23   Past Medical History:  Diagnosis Date   Anticoagulant long-term use    plavix  (for hx TIA)   Arthritis    right shoulder    Benign essential tremor    hands -- occasional   CAD S/P percutaneous coronary angioplasty cardiologist-- dr harding   a) PCI and BMS to LAD in 1991; b) CATH 10/2010: Severe 2 Vessel disease = diffuse RCA ( 75% prox, 90% mid & 70 + 75% distal) & LAD (80-90% pre-stent, 75% post-stent) with occluded D1 (filled via OM-D1 collaterals)  -- CABG x 2; c) Myoview 4/'15: INTERMEDIATE RISK - small sized reversible apical/anterolateral defect -- most likely c/w known occlusion of D1 with collaterals.(med Rx)   History of basal cell carcinoma (BCC) excision    History of  bladder cancer urologist-- dr eskridge   dx 11/ 2015-- s/p TURBT   History of kidney stones    History of TIA (transient ischemic attack) 12/09/2013   History of urethral stricture    Hyperlipidemia with target LDL less than 70    controlled.   Neuropathy of lower extremity    Nocturia    Osteoporosis    Peripheral neuropathy    lower extremity   Postoperative atrial fibrillation (HCC) 10/2010   after bypass-"shocked back in and no problems since":   per cardiologist note, dr Addie Holstein, no recurrence   Pre-diabetes    S/P CABG x 2 10/18/2010   LIMA-LAD, SVG to RPDA (Dr. Alva Jewels)   TGA (transient global amnesia)    "x3 for hours then goes away"   Transient Dilated idiopathic cardiomyopathy April 2015; 04/2014   a) in setting of TIA: EF 35-40% (reduced from 55-65 percent) mild AI, moderate and dilated aorta.;; b) Recheck Echo 04/2014: EF  55-60%, Gr 1 DD, mild-mod AI.     Wears glasses    Wears partial dentures    upper   Past Surgical History:  Procedure Laterality Date   APPENDECTOMY  as teen   BASAL CELL CARCINOMA EXCISION  07/2014   CARDIAC CATHETERIZATION  10/17/2010   80%prox w/mild in-stent restenosis w/75% mid-LADstenosis aft the stent,multi severe stenoses RCAw/heavily calcified vessel,norm LV   CARDIOVERSION  2012   after bypass surgery   CATARACT EXTRACTION W/ INTRAOCULAR LENS  IMPLANT, BILATERAL  2009  approx.   COCCYX REMOVAL     CORONARY ANGIOPLASTY WITH STENT PLACEMENT  1991   PCI and BMS to LAD   CORONARY ARTERY BYPASS GRAFT  10-18-2010  dr Lander Pines @[facility]    LIMA-LAD, SVG-rPDA   CYSTOSCOPY W/ RETROGRADES N/A 07/12/2014   Procedure: CYSTOSCOPY WITH RETROGRADE PYELOGRAM;  Surgeon: Christina Coyer, MD;  Location: [facility];  Service: Urology;  Laterality: N/A;   CYSTOSCOPY WITH BIOPSY N/A 09/09/2014   Procedure: CYSTO WITH BIOPSY AND FULGERATION, DILATION OF URETHRAL STRICTURE;  Surgeon: Christina Coyer, MD;  Location: [facility];  Service: Urology;  Laterality: N/A;  BLADDER BIOPSY   CYSTOSCOPY WITH BIOPSY N/A 01/03/2015   Procedure: CYSTOSCOPY WITH BIOPSY;  Surgeon: Christina Coyer, MD;  Location: [facility];  Service: Urology;  Laterality: N/A;   CYSTOSCOPY WITH BIOPSY N/A 01/26/2019   Procedure: CYSTOSCOPY WITH BIOPSY, BILATERAL RETROGRADE PYELOGRAM;  Surgeon: Christina Coyer, MD;  Location: [facility];  Service: Urology;  Laterality: N/A;   CYSTOSCOPY WITH FULGERATION N/A 01/26/2019   Procedure: CYSTOSCOPY WITH FULGERATION;  Surgeon: Christina Coyer, MD;  Location: [facility];  Service: Urology;  Laterality: N/A;   CYSTOSCOPY WITH URETHRAL DILATATION N/A 01/03/2015   Procedure: CYSTOSCOPY WITH URETHRAL DILATATION;  Surgeon: Christina Coyer, MD;  Location: [facility];  Service: Urology;  Laterality: N/A;   DOPPLER ECHOCARDIOGRAPHY  10/18/2004   EF 55-65%,BORDERLINE -enlarged aortic root,mild  aortic insuff.,trace tricus.insuff.   FINGER SURGERY Left 03-17-2001   dr Donzella Galley  @[facility]    repair complex laceration injury 5th digit   KIDNEY STONE SURGERY  1989   NM MYOVIEW LTD  12/16/2013   INTERMEDIATE RISK test with small sized reversible perfusion defect in the apical anterolateral wall -- most likely consistent with known occlusion of D1 with collaterals.   shoulder surgery Right 1988   TONSILLECTOMY  as teen   TRANSTHORACIC ECHOCARDIOGRAM  April 2015   EF 35-40%, mild AI, moderately dilated ascending aorta   TRANSURETHRAL RESECTION OF BLADDER TUMOR Right 06/11/2022  Procedure: TRANSURETHRAL RESECTION OF BLADDER TUMOR (TURBT)  >5cm;  Surgeon: Christina Coyer, MD;  Location: [facility];  Service: Urology;  Laterality: Right;   TRANSURETHRAL RESECTION OF PROSTATE N/A 07/12/2014   Procedure: TRANSURETHRAL RESECTION OF THE PROSTATE (TURP) WITH MITOMYCIN -C;  Surgeon: Christina Coyer, MD;  Location: [facility];  Service: Urology;  Laterality: N/A;   TRANSURETHRAL RESECTION OF PROSTATE N/A 06/11/2022   Procedure: TRANSURETHRAL RESECTION OF THE PROSTATE (TURP);  Surgeon: Christina Coyer, MD;  Location: [facility];  Service: Urology;  Laterality: N/A;  REQUESTING 2 HRS    No Known Allergies  Allergies as of 12/12/2023   No Known Allergies      Medication List        Accurate as of December 12, 2023 11:59 PM. If you have any questions, ask your nurse or doctor.          acetaminophen 500 MG tablet Commonly known as: TYLENOL Take 1,000 mg by mouth every 6 (six) hours as needed for mild pain.   Calcium Antacid Ultra Strength 1000 MG chewable tablet Generic drug: calcium elemental as carbonate Chew 1,000 mg by mouth daily.   clopidogrel 75 MG tablet Commonly known as: PLAVIX Take 75 mg by mouth daily.   ezetimibe 10 MG tablet Commonly known as: ZETIA Take 10 mg by mouth daily.   fish oil-omega-3 fatty acids 1000 MG capsule Take 1 g by mouth daily.   gabapentin 100 MG  capsule Commonly known as: NEURONTIN Take 100 mg by mouth 2 (two) times daily.   HYDROcodone-acetaminophen 5-325 MG tablet Commonly known as: NORCO/VICODIN Take 0.5 tablets by mouth every 12 (twelve) hours as needed for moderate pain (pain score 4-6).   HYDROcodone-acetaminophen 5-325 MG tablet Commonly known as: Norco Take 0.5 tablets by mouth daily.   Jardiance 10 MG Tabs tablet Generic drug: empagliflozin Take 10 mg by mouth daily.   latanoprost 0.005 % ophthalmic solution Commonly known as: XALATAN Place 1 drop into both eyes at bedtime.   LORazepam 0.5 MG tablet Commonly known as: ATIVAN Take 0.25 mg by mouth as needed for anxiety.   metFORMIN 500 MG tablet Commonly known as: GLUCOPHAGE Take 500 mg by mouth daily.   mirtazapine 7.5 MG tablet Commonly known as: REMERON Take 7.5 mg by mouth at bedtime.   multivitamin capsule Take 1 capsule by mouth at bedtime.   mupirocin ointment 2 % Commonly known as: BACTROBAN 1 Application daily. Apply to Right lateral foot wound topically every evening shift for right lateral foot wound Clean with wound cleanser daily, dry, apply Mupirocin ointment and cover with Mepilex   Prolia 60 MG/ML Sosy injection Generic drug: denosumab Inject 60 mg into the skin every 6 (six) months.   rosuvastatin 40 MG tablet Commonly known as: CRESTOR Take 40 mg by mouth daily.   tamsulosin 0.4 MG Caps capsule Commonly known as: FLOMAX Take 0.4 mg by mouth at bedtime.   timolol 0.5 % ophthalmic solution Commonly known as: TIMOPTIC Place 1 drop into both eyes every morning.   VITAMIN B12 PO Place 5,000 mcg under the tongue daily.   vitamin D3 50 MCG (2000 UT) Caps Take 2,000 Units by mouth daily.   Voltaren 1 % Gel Generic drug: diclofenac Sodium Apply topically 4 (four) times daily.   Zinc Oxide 10 % Oint Apply 1 Application topically as needed (Apply to right buttocks.).        Review of Systems  Constitutional:  Negative  for appetite change, fatigue and fever.  HENT:  Positive for hearing loss. Negative for congestion and trouble swallowing.   Eyes:  Positive for visual disturbance.       Low vision  Respiratory:  Negative for cough.   Cardiovascular:  Positive for leg swelling.  Gastrointestinal:  Negative for abdominal pain and constipation.  Genitourinary:  Positive for frequency. Negative for dysuria and hematuria.  Musculoskeletal:  Positive for arthralgias and gait problem.  Skin:  Positive for wound.  Neurological:  Positive for numbness. Negative for weakness and headaches.       Hx of numbness in legs.   Psychiatric/Behavioral:  Positive for sleep disturbance. Negative for agitation. The patient is not nervous/anxious.     Immunization History  Administered Date(s) Administered   Influenza, High Dose Seasonal PF 06/04/2023   Influenza-Unspecified 06/02/2014   Moderna Covid-19 Vaccine Bivalent Booster 45yrs & up 06/18/2023   PNEUMOCOCCAL CONJUGATE-20 02/11/2023   Tdap 08/25/2022   Zoster Recombinant(Shingrix) 06/18/2018, 08/28/2018   Pertinent  Health Maintenance Due  Topic Date Due   OPHTHALMOLOGY EXAM  Never done   HEMOGLOBIN A1C  02/03/2024   INFLUENZA VACCINE  04/02/2024   FOOT EXAM  07/24/2024      03/28/2019    8:25 AM 03/22/2022   11:09 AM 08/06/2022    9:18 PM 08/25/2022    6:51 PM 02/04/2023    4:10 PM  Fall Risk  Falls in the past year?     1  Was there an injury with Fall?     1  Fall Risk Category Calculator     3  (RETIRED) Patient Fall Risk Level High fall risk Moderate fall risk Low fall risk Low fall risk   Patient at Risk for Falls Due to     History of fall(s);Impaired balance/gait;Impaired mobility  Fall risk Follow up     Falls evaluation completed   Functional Status Survey:    Vitals:   12/12/23 1637  BP: 104/60  Pulse: 85  Resp: 18  Temp: 97.7 F (36.5 C)  SpO2: 95%  Weight: 152 lb 6.4 oz (69.1 kg)   Body mass index is 22.51 kg/m. Physical  Exam Vitals and nursing note reviewed.  Constitutional:      Appearance: Normal appearance.  HENT:     Head: Normocephalic and atraumatic.     Nose: Nose normal.     Mouth/Throat:     Mouth: Mucous membranes are moist.     Comments:   Eyes:     Extraocular Movements: Extraocular movements intact.     Conjunctiva/sclera: Conjunctivae normal.     Pupils: Pupils are equal, round, and reactive to light.  Cardiovascular:     Rate and Rhythm: Normal rate and regular rhythm.     Heart sounds: No murmur heard. Pulmonary:     Effort: Pulmonary effort is normal.     Breath sounds: No rales.  Abdominal:     General: Bowel sounds are normal.     Palpations: Abdomen is soft.     Tenderness: There is no abdominal tenderness.  Musculoskeletal:        General: No tenderness.     Cervical back: Normal range of motion and neck supple.     Right lower leg: Edema present.     Left lower leg: Edema present.     Comments: Trace edema in ankles.   Skin:    General: Skin is warm and dry.     Comments: the lateral right foot open wound about a dime sized, punched  out appearance, gray wound bed, near healed.    Neurological:     General: No focal deficit present.     Mental Status: He is alert and oriented to person, place, and time. Mental status is at baseline.     Gait: Gait abnormal.  Psychiatric:     Comments: Restless at night reportd Smiled, answered questions, followed directions during my examination today.      Labs reviewed: Recent Labs    01/29/23 1606 01/30/23 0508 02/11/23 0000 08/05/23 0000  NA 134* 136 139 139  K 4.7 3.6 4.6 4.2  CL 102 108 107 105  CO2 22 20* 25* 27*  GLUCOSE 211* 135*  --   --   BUN 33* 26* 18 23*  CREATININE 0.98 0.88 1.0 0.8  CALCIUM 9.5 8.5* 8.8 9.2  MG  --  2.1  --   --   PHOS  --  3.4  --   --    Recent Labs    01/29/23 1606 01/30/23 0508 08/05/23 0000  AST 30 22 37  ALT 33 24 43*  ALKPHOS 42 33* 32  BILITOT 1.3* 1.2  --   PROT 7.4  5.6*  --   ALBUMIN 3.9 2.9* 3.8   Recent Labs    01/29/23 1606 01/30/23 0508 02/11/23 0000 06/17/23 0000 08/05/23 0000  WBC 8.7 5.9 3.8 4.3 4.3  NEUTROABS 7.2  --   --  2,378.00 2,408.00  HGB 13.3 10.2* 10.6* 13.3* 13.1*  HCT 41.1 31.1* 32* 42 41  MCV 97.4 96.9  --   --   --   PLT 161 109* 192 159 145*   Lab Results  Component Value Date   TSH 1.89 08/05/2023   Lab Results  Component Value Date   HGBA1C 8.0 08/05/2023   Lab Results  Component Value Date   CHOL 121 02/20/2023   HDL 43 02/20/2023   LDLCALC 56 02/20/2023   TRIG 134 02/20/2023   CHOLHDL 2.6 12/10/2013    Significant Diagnostic Results in last 30 days:  No results found.  Assessment/Plan  Blood loss anemia blood loss, Hgb 13.1 08/05/23  Type 2 diabetes mellitus (HCC) Hgb A1c 8.2 01/02/23>>7.8 01/29/23 since started Meformin, Jardiance, Bun/creat 23/0.8 08/05/23  Urinary frequency Hx of bladder neoplasm,  2-3x nocturnal urination, on Tamsulosin. 02/05/23 Urology resume Plavix. Cysto followup. BCG (immuno therapy)  Two-vessel CAD status post CABG x2;  No chest pain reported,  GABG x2, post Op afib, takes Plavix, Jardiance, and statin  Vitamin D deficiency on Vit D3, Vit D 51 01/02/23  Vitamin B12 deficiency  on Vit B12, Vit B12 877  Hereditary and idiopathic peripheral neuropathy on Gabapentin, ambulates with walker.  Wobbly, risk of falling.   Hypotension Resolved, off Metoprolol.    Family/ staff Communication: plan of care reviewed with the patient and charge nurse.   Labs/tests ordered:  none

## 2023-12-15 NOTE — Assessment & Plan Note (Signed)
 Mostly left arm, controlled pain on Norco

## 2023-12-15 NOTE — Assessment & Plan Note (Signed)
 Hgb A1c 8.2 01/02/23>>7.8 01/29/23 since started Meformin, Jardiance, Bun/creat 23/0.8 08/05/23

## 2023-12-15 NOTE — Assessment & Plan Note (Signed)
 Hx of bladder neoplasm,  2-3x nocturnal urination, on Tamsulosin. 02/05/23 Urology resume Plavix. Cysto followup. BCG (immuno therapy)

## 2023-12-15 NOTE — Assessment & Plan Note (Signed)
 on Vit B12, Vit B12 877

## 2023-12-15 NOTE — Assessment & Plan Note (Signed)
 Resolved, off Metoprolol.

## 2023-12-15 NOTE — Assessment & Plan Note (Signed)
 No chest pain reported,  GABG x2, post Op afib, takes Plavix, Jardiance, and statin

## 2023-12-15 NOTE — Assessment & Plan Note (Signed)
 blood loss, Hgb 13.1 08/05/23

## 2023-12-15 NOTE — Assessment & Plan Note (Signed)
 on Gabapentin, ambulates with walker.  Wobbly, risk of falling.

## 2023-12-15 NOTE — Assessment & Plan Note (Signed)
on Vit D3, Vit D 51 01/02/23

## 2024-01-27 ENCOUNTER — Non-Acute Institutional Stay: Payer: Self-pay | Admitting: Nurse Practitioner

## 2024-01-27 DIAGNOSIS — G609 Hereditary and idiopathic neuropathy, unspecified: Secondary | ICD-10-CM

## 2024-01-27 DIAGNOSIS — I2510 Atherosclerotic heart disease of native coronary artery without angina pectoris: Secondary | ICD-10-CM

## 2024-01-27 DIAGNOSIS — M150 Primary generalized (osteo)arthritis: Secondary | ICD-10-CM

## 2024-01-27 DIAGNOSIS — F5101 Primary insomnia: Secondary | ICD-10-CM

## 2024-01-27 DIAGNOSIS — I959 Hypotension, unspecified: Secondary | ICD-10-CM | POA: Diagnosis not present

## 2024-01-27 DIAGNOSIS — E1169 Type 2 diabetes mellitus with other specified complication: Secondary | ICD-10-CM

## 2024-01-27 DIAGNOSIS — E785 Hyperlipidemia, unspecified: Secondary | ICD-10-CM

## 2024-01-27 DIAGNOSIS — D500 Iron deficiency anemia secondary to blood loss (chronic): Secondary | ICD-10-CM | POA: Diagnosis not present

## 2024-01-27 DIAGNOSIS — R350 Frequency of micturition: Secondary | ICD-10-CM

## 2024-01-27 DIAGNOSIS — I480 Paroxysmal atrial fibrillation: Secondary | ICD-10-CM

## 2024-01-27 NOTE — Assessment & Plan Note (Signed)
on Zetia, Fish oil, Rosuvastatin, LDL 56 02/20/23

## 2024-01-27 NOTE — Assessment & Plan Note (Signed)
on Gabapentin, ambulates with walker.  

## 2024-01-27 NOTE — Assessment & Plan Note (Signed)
 Hgb A1c 8.2 01/02/23>>7.8 01/29/23 since started Meformin, Jardiance, Bun/creat 23/0.8 08/05/23

## 2024-01-27 NOTE — Assessment & Plan Note (Signed)
Heart rate is in control,  02/12/23 off Metoprolol 2/2 low Bp,  TSH 2.91 01/02/23 

## 2024-01-27 NOTE — Assessment & Plan Note (Signed)
 taking Norco.

## 2024-01-27 NOTE — Progress Notes (Unsigned)
 Location:   [facility] Room Number: 926 Place of Service:  ALF (13) Provider: Abner Hoffman Annasophia Crocker NP  Imelda Molly Savarino, MD  Patient Care Team: Imelda Mindi Akerson, MD as PCP - General (Internal Medicine) Arleen Lacer, MD as PCP - Cardiology (Cardiology)  Extended Emergency Contact Information Primary Emergency Contact: [name],[name] Mobile Phone: [phone] Relation: Nephew Preferred language: English Interpreter needed? No Secondary Emergency Contact: wright,[name] Mobile Phone: [phone] Relation: Niece  Code Status: DNR Goals of care: Advanced Directive information    09/05/2023    3:47 PM  Advanced Directives  Does Patient Have a Medical Advance Directive? Yes  Type of Advance Directive Out of facility DNR (pink MOST or yellow form)  Does patient want to make changes to medical advance directive? No - Patient declined  Pre-existing out of facility DNR order (yellow form or pink MOST form) Yellow form placed in chart (order not valid for inpatient use)     Chief Complaint  Patient presents with  . Acute Visit    Facial lesion, not sleeping well at night.     HPI:  Pt is a 89 y.o. male seen today for an acute visit for insomnia, awake at all times of night and calls POA, desires to increase Mirtazapine. Non healing area lateral the right corner of mouth.     Chronic left arm pain, taking Norco.               The lateral right foot open wound, healed.               Bp controlled, off Metoprolol .  Hematuria, resolved, Plavix  was resumed by Urology 02/12/23             Anemia, blood loss, Hgb 13.1 08/05/23 T2DM, Hgb A1c 8.2 01/02/23>>7.8 01/29/23 since started Meformin, Jardiance, Bun/creat 23/0.8 08/05/23             Urinary frequency, Hx of bladder neoplasm,  2-3x nocturnal urination, on Tamsulosin . 02/05/23 Urology resume Plavix . Cysto followup. BCG (immuno therapy)             CAD, GABG x2, post Op afib, takes Plavix , Jardiance, and statin             Vitamin D   deficiency, on Vit D3, Vit D 51 01/02/23             Vit B12 deficiency, on Vit B12, Vit B12 877             Peripheral neuropathy, on Gabapentin , ambulates with walker.  Glaucoma, wear glasses, on Timolol, Latanoprost HLD on Zetia , Fish oil, Rosuvastatin , LDL 56 02/20/23 Paroxysmal tachycardia, 02/12/23 off Metoprolol  2/2 low Bp,  TSH 2.91 01/02/23    Past Medical History:  Diagnosis Date  . Anticoagulant long-term use    plavix   (for hx TIA)  . Arthritis    right shoulder   . Benign essential tremor    hands -- occasional  . CAD S/P percutaneous coronary angioplasty cardiologist-- dr harding   a) PCI and BMS to LAD in 1991; b) CATH 10/2010: Severe 2 Vessel disease = diffuse RCA ( 75% prox, 90% mid & 70 + 75% distal) & LAD (80-90% pre-stent, 75% post-stent) with occluded D1 (filled via OM-D1 collaterals)  -- CABG x 2; c) Myoview  4/'15: INTERMEDIATE RISK - small sized reversible apical/anterolateral defect -- most likely c/w known occlusion of D1 with collaterals.(med Rx)  . History of basal cell carcinoma (BCC) excision   . History of bladder cancer urologist--  dr eskridge   dx 11/ 2015-- s/p TURBT  . History of kidney stones   . History of TIA (transient ischemic attack) 12/09/2013  . History of urethral stricture   . Hyperlipidemia with target LDL less than 70    controlled.  . Neuropathy of lower extremity   . Nocturia   . Osteoporosis   . Peripheral neuropathy    lower extremity  . Postoperative atrial fibrillation (HCC) 10/2010   after bypass-"shocked back in and no problems since":   per cardiologist note, dr harding, no recurrence  . Pre-diabetes   . S/P CABG x 2 10/18/2010   LIMA-LAD, SVG to RPDA (Dr. Alva Jewels)  . TGA (transient global amnesia)    "x3 for hours then goes away"  . Transient Dilated idiopathic cardiomyopathy April 2015; 04/2014   a) in setting of TIA: EF 35-40% (reduced from 55-65 percent) mild AI, moderate and dilated aorta.;; b) Recheck Echo 04/2014: EF 55-60%, Gr  1 DD, mild-mod AI.    Aaron Aas Wears glasses   . Wears partial dentures    upper   Past Surgical History:  Procedure Laterality Date  . APPENDECTOMY  as teen  . BASAL CELL CARCINOMA EXCISION  07/2014  . CARDIAC CATHETERIZATION  10/17/2010   80%prox w/mild in-stent restenosis w/75% mid-LADstenosis aft the stent,multi severe stenoses RCAw/heavily calcified vessel,norm LV  . CARDIOVERSION  2012   after bypass surgery  . CATARACT EXTRACTION W/ INTRAOCULAR LENS  IMPLANT, BILATERAL  2009  approx.  . COCCYX REMOVAL    . CORONARY ANGIOPLASTY WITH STENT PLACEMENT  1991   PCI and BMS to LAD  . CORONARY ARTERY BYPASS GRAFT  10-18-2010  dr Lander Pines @[facility]    LIMA-LAD, SVG-rPDA  . CYSTOSCOPY W/ RETROGRADES N/A 07/12/2014   Procedure: CYSTOSCOPY WITH RETROGRADE PYELOGRAM;  Surgeon: Christina Coyer, MD;  Location: [facility];  Service: Urology;  Laterality: N/A;  . CYSTOSCOPY WITH BIOPSY N/A 09/09/2014   Procedure: CYSTO WITH BIOPSY AND FULGERATION, DILATION OF URETHRAL STRICTURE;  Surgeon: Christina Coyer, MD;  Location: [facility];  Service: Urology;  Laterality: N/A;  BLADDER BIOPSY  . CYSTOSCOPY WITH BIOPSY N/A 01/03/2015   Procedure: CYSTOSCOPY WITH BIOPSY;  Surgeon: Christina Coyer, MD;  Location: [facility];  Service: Urology;  Laterality: N/A;  . CYSTOSCOPY WITH BIOPSY N/A 01/26/2019   Procedure: CYSTOSCOPY WITH BIOPSY, BILATERAL RETROGRADE PYELOGRAM;  Surgeon: Christina Coyer, MD;  Location: [facility];  Service: Urology;  Laterality: N/A;  . CYSTOSCOPY WITH FULGERATION N/A 01/26/2019   Procedure: CYSTOSCOPY WITH FULGERATION;  Surgeon: Christina Coyer, MD;  Location: [facility];  Service: Urology;  Laterality: N/A;  . CYSTOSCOPY WITH URETHRAL DILATATION N/A 01/03/2015   Procedure: CYSTOSCOPY WITH URETHRAL DILATATION;  Surgeon: Christina Coyer, MD;  Location: [facility];  Service: Urology;  Laterality: N/A;  . DOPPLER ECHOCARDIOGRAPHY  10/18/2004   EF 55-65%,BORDERLINE -enlarged aortic  root,mild aortic insuff.,trace tricus.insuff.  Aaron Aas FINGER SURGERY Left 03-17-2001   dr Donzella Galley  @[facility]    repair complex laceration injury 5th digit  . KIDNEY STONE SURGERY  1989  . NM MYOVIEW  LTD  12/16/2013   INTERMEDIATE RISK test with small sized reversible perfusion defect in the apical anterolateral wall -- most likely consistent with known occlusion of D1 with collaterals.  . shoulder surgery Right 1988  . TONSILLECTOMY  as teen  . TRANSTHORACIC ECHOCARDIOGRAM  April 2015   EF 35-40%, mild AI, moderately dilated ascending aorta  . TRANSURETHRAL RESECTION OF BLADDER TUMOR Right 06/11/2022   Procedure: TRANSURETHRAL  RESECTION OF BLADDER TUMOR (TURBT)  >5cm;  Surgeon: Christina Coyer, MD;  Location: [facility];  Service: Urology;  Laterality: Right;  . TRANSURETHRAL RESECTION OF PROSTATE N/A 07/12/2014   Procedure: TRANSURETHRAL RESECTION OF THE PROSTATE (TURP) WITH MITOMYCIN  -C;  Surgeon: Christina Coyer, MD;  Location: [facility];  Service: Urology;  Laterality: N/A;  . TRANSURETHRAL RESECTION OF PROSTATE N/A 06/11/2022   Procedure: TRANSURETHRAL RESECTION OF THE PROSTATE (TURP);  Surgeon: Christina Coyer, MD;  Location: [facility];  Service: Urology;  Laterality: N/A;  REQUESTING 2 HRS    No Known Allergies  Allergies as of 01/27/2024   No Known Allergies      Medication List        Accurate as of Jan 27, 2024 11:59 PM. If you have any questions, ask your nurse or doctor.          acetaminophen  500 MG tablet Commonly known as: TYLENOL  Take 1,000 mg by mouth every 6 (six) hours as needed for mild pain.   Calcium  Antacid Ultra Strength 1000 MG chewable tablet Generic drug: calcium  elemental as carbonate Chew 1,000 mg by mouth daily.   clopidogrel  75 MG tablet Commonly known as: PLAVIX  Take 75 mg by mouth daily.   ezetimibe  10 MG tablet Commonly known as: ZETIA  Take 10 mg by mouth daily.   fish oil-omega-3 fatty acids  1000 MG capsule Take 1 g by mouth daily.   gabapentin   100 MG capsule Commonly known as: NEURONTIN  Take 100 mg by mouth 2 (two) times daily.   HYDROcodone -acetaminophen  5-325 MG tablet Commonly known as: NORCO/VICODIN Take 0.5 tablets by mouth every 12 (twelve) hours as needed for moderate pain (pain score 4-6).   HYDROcodone -acetaminophen  5-325 MG tablet Commonly known as: Norco Take 0.5 tablets by mouth daily.   Jardiance 10 MG Tabs tablet Generic drug: empagliflozin Take 10 mg by mouth daily.   latanoprost 0.005 % ophthalmic solution Commonly known as: XALATAN Place 1 drop into both eyes at bedtime.   LORazepam 0.5 MG tablet Commonly known as: ATIVAN Take 0.25 mg by mouth as needed for anxiety.   metFORMIN 500 MG tablet Commonly known as: GLUCOPHAGE Take 500 mg by mouth daily.   mirtazapine 7.5 MG tablet Commonly known as: REMERON Take 7.5 mg by mouth at bedtime.   multivitamin capsule Take 1 capsule by mouth at bedtime.   mupirocin  ointment 2 % Commonly known as: BACTROBAN  1 Application daily. Apply to Right lateral foot wound topically every evening shift for right lateral foot wound Clean with wound cleanser daily, dry, apply Mupirocin  ointment and cover with Mepilex   Prolia 60 MG/ML Sosy injection Generic drug: denosumab Inject 60 mg into the skin every 6 (six) months.   rosuvastatin  40 MG tablet Commonly known as: CRESTOR  Take 40 mg by mouth daily.   tamsulosin  0.4 MG Caps capsule Commonly known as: FLOMAX  Take 0.4 mg by mouth at bedtime.   timolol 0.5 % ophthalmic solution Commonly known as: TIMOPTIC Place 1 drop into both eyes every morning.   VITAMIN B12 PO Place 5,000 mcg under the tongue daily.   vitamin D3 50 MCG (2000 UT) Caps Take 2,000 Units by mouth daily.   Voltaren 1 % Gel Generic drug: diclofenac Sodium Apply topically 4 (four) times daily.   Zinc Oxide 10 % Oint Apply 1 Application topically as needed (Apply to right buttocks.).        Review of Systems  Constitutional:   Negative for appetite change, fatigue and fever.  HENT:  Positive for hearing loss. Negative for congestion and trouble swallowing.   Eyes:  Positive for visual disturbance.       Low vision  Respiratory:  Negative for cough.   Cardiovascular:  Negative for leg swelling.  Gastrointestinal:  Negative for abdominal pain and constipation.  Genitourinary:  Positive for frequency. Negative for dysuria and hematuria.  Musculoskeletal:  Positive for arthralgias and gait problem.  Skin:  Positive for wound.  Neurological:  Positive for numbness. Negative for weakness and headaches.       Hx of numbness in legs.   Psychiatric/Behavioral:  Positive for sleep disturbance. Negative for agitation. The patient is not nervous/anxious.     Immunization History  Administered Date(s) Administered  . Influenza, High Dose Seasonal PF 06/04/2023  . Influenza-Unspecified 06/02/2014  . Moderna Covid-19 Vaccine Bivalent Booster 58yrs & up 06/18/2023  . PNEUMOCOCCAL CONJUGATE-20 02/11/2023  . Tdap 08/25/2022  . Zoster Recombinant(Shingrix) 06/18/2018, 08/28/2018   Pertinent  Health Maintenance Due  Topic Date Due  . OPHTHALMOLOGY EXAM  Never done  . HEMOGLOBIN A1C  02/03/2024  . INFLUENZA VACCINE  04/02/2024  . FOOT EXAM  07/24/2024      03/28/2019    8:25 AM 03/22/2022   11:09 AM 08/06/2022    9:18 PM 08/25/2022    6:51 PM 02/04/2023    4:10 PM  Fall Risk  Falls in the past year?     1  Was there an injury with Fall?     1  Fall Risk Category Calculator     3  (RETIRED) Patient Fall Risk Level High fall risk Moderate fall risk Low fall risk Low fall risk   Patient at Risk for Falls Due to     History of fall(s);Impaired balance/gait;Impaired mobility  Fall risk Follow up     Falls evaluation completed   Functional Status Survey:    Vitals:   01/27/24 1109  BP: 111/69  Pulse: 72  Resp: 20  Temp: (!) 97.5 F (36.4 C)  SpO2: 96%  Weight: 155 lb 9.6 oz (70.6 kg)   Body mass index is 22.98  kg/m. Physical Exam Vitals and nursing note reviewed.  Constitutional:      Appearance: Normal appearance.  HENT:     Head: Normocephalic and atraumatic.     Nose: Nose normal.     Mouth/Throat:     Mouth: Mucous membranes are moist.     Comments:   Eyes:     Extraocular Movements: Extraocular movements intact.     Conjunctiva/sclera: Conjunctivae normal.     Pupils: Pupils are equal, round, and reactive to light.  Cardiovascular:     Rate and Rhythm: Normal rate and regular rhythm.     Heart sounds: No murmur heard. Pulmonary:     Effort: Pulmonary effort is normal.     Breath sounds: No rales.  Abdominal:     General: Bowel sounds are normal.     Palpations: Abdomen is soft.     Tenderness: There is no abdominal tenderness.  Musculoskeletal:        General: No tenderness.     Cervical back: Normal range of motion and neck supple.     Right lower leg: No edema.     Left lower leg: No edema.  Skin:    General: Skin is warm and dry.     Findings: Lesion present.     Comments: Lateral right corner of mouth non healing scabbed over area, no s/s of infection.  Neurological:     General: No focal deficit present.     Mental Status: He is alert and oriented to person, place, and time. Mental status is at baseline.     Gait: Gait abnormal.  Psychiatric:     Comments: Restless at night reportd Smiled, answered questions, followed directions during my examination today.     Labs reviewed: Recent Labs    01/30/23 0508 02/11/23 0000 08/05/23 0000  NA 136 139 139  K 3.6 4.6 4.2  CL 108 107 105  CO2 20* 25* 27*  GLUCOSE 135*  --   --   BUN 26* 18 23*  CREATININE 0.88 1.0 0.8  CALCIUM  8.5* 8.8 9.2  MG 2.1  --   --   PHOS 3.4  --   --    Recent Labs    01/30/23 0508 08/05/23 0000  AST 22 37  ALT 24 43*  ALKPHOS 33* 32  BILITOT 1.2  --   PROT 5.6*  --   ALBUMIN 2.9* 3.8   Recent Labs    01/30/23 0508 02/11/23 0000 06/17/23 0000 08/05/23 0000  WBC 5.9  3.8 4.3 4.3  NEUTROABS  --   --  2,378.00 2,408.00  HGB 10.2* 10.6* 13.3* 13.1*  HCT 31.1* 32* 42 41  MCV 96.9  --   --   --   PLT 109* 192 159 145*   Lab Results  Component Value Date   TSH 1.89 08/05/2023   Lab Results  Component Value Date   HGBA1C 8.0 08/05/2023   Lab Results  Component Value Date   CHOL 121 02/20/2023   HDL 43 02/20/2023   LDLCALC 56 02/20/2023   TRIG 134 02/20/2023   CHOLHDL 2.6 12/10/2013    Significant Diagnostic Results in last 30 days:  No results found.  Assessment/Plan: Insomnia  insomnia, awake at all times of night and calls POA, desires to increase Mirtazapine, will try Mirtazapine 15mg  at bedtime.  Update labs: CBC/diff, CMP/eGFR, TSH, lipids.   Osteoarthritis, multiple sites  taking Norco.   Hypotension Bp normalized after Metoprolol  was dc'd   Blood loss anemia blood loss, Hgb 13.1 08/05/23  Type 2 diabetes mellitus (HCC) Hgb A1c 8.2 01/02/23>>7.8 01/29/23 since started Meformin, Jardiance, Bun/creat 23/0.8 08/05/23  Urinary frequency Hx of bladder neoplasm,  2-3x nocturnal urination, on Tamsulosin . 02/05/23 Urology resume Plavix . Cysto followup. BCG (immuno therapy)  Two-vessel CAD status post CABG x2;  GABG x2, post Op afib, takes Plavix , Jardiance, and statin  Hereditary and idiopathic peripheral neuropathy  on Gabapentin , ambulates with walker.   Dyslipidemia, goal LDL below 70 on Zetia , Fish oil, Rosuvastatin , LDL 56 02/20/23  AF (atrial fibrillation) -- Post Operative Heart rate is in control, 02/12/23 off Metoprolol  2/2 low Bp,  TSH 2.91 01/02/23    Family/ staff Communication: plan of care reviewed with the patient and charge nurse.   Labs/tests ordered: CBC/diff, CMP/eGFR, TSH, lipids.

## 2024-01-27 NOTE — Assessment & Plan Note (Signed)
 Hx of bladder neoplasm,  2-3x nocturnal urination, on Tamsulosin. 02/05/23 Urology resume Plavix. Cysto followup. BCG (immuno therapy)

## 2024-01-27 NOTE — Assessment & Plan Note (Signed)
 Bp normalized after Metoprolol  was dc'd

## 2024-01-27 NOTE — Assessment & Plan Note (Signed)
 blood loss, Hgb 13.1 08/05/23

## 2024-01-27 NOTE — Assessment & Plan Note (Signed)
 GABG x2, post Op afib, takes Plavix , Jardiance, and statin

## 2024-01-27 NOTE — Assessment & Plan Note (Addendum)
 insomnia, awake at all times of night and calls POA, desires to increase Mirtazapine, will try Mirtazapine 15mg  at bedtime.  Update labs: CBC/diff, CMP/eGFR, TSH, lipids.

## 2024-01-29 ENCOUNTER — Encounter: Payer: Self-pay | Admitting: Nurse Practitioner

## 2024-01-29 LAB — CBC AND DIFFERENTIAL
HCT: 40 — AB (ref 41–53)
Hemoglobin: 13.1 — AB (ref 13.5–17.5)
Neutrophils Absolute: 2335
Platelets: 140 10*3/uL — AB (ref 150–400)
WBC: 4.2

## 2024-01-29 LAB — VITAMIN D 25 HYDROXY (VIT D DEFICIENCY, FRACTURES): Vit D, 25-Hydroxy: 50

## 2024-01-29 LAB — BASIC METABOLIC PANEL WITH GFR
BUN: 28 — AB (ref 4–21)
CO2: 26 — AB (ref 13–22)
Chloride: 107 (ref 99–108)
Creatinine: 0.9 (ref 0.6–1.3)
Glucose: 136
Potassium: 4.4 meq/L (ref 3.5–5.1)
Sodium: 139 (ref 137–147)

## 2024-01-29 LAB — LIPID PANEL
Cholesterol: 113 (ref 0–200)
HDL: 40 (ref 35–70)
LDL Cholesterol: 102
LDl/HDL Ratio: 2.8
Triglycerides: 102 (ref 40–160)

## 2024-01-29 LAB — COMPREHENSIVE METABOLIC PANEL WITH GFR
Albumin: 6.1 — AB (ref 3.5–5.0)
Calcium: 9.1 (ref 8.7–10.7)
Globulin: 2.5
eGFR: 78

## 2024-01-29 LAB — HEPATIC FUNCTION PANEL
ALT: 17 U/L (ref 10–40)
AST: 21 (ref 14–40)
Alkaline Phosphatase: 28 (ref 25–125)
Bilirubin, Total: 1.1

## 2024-01-29 LAB — CBC: RBC: 4.11 (ref 3.87–5.11)

## 2024-01-29 LAB — TSH: TSH: 2.13 (ref 0.41–5.90)

## 2024-02-03 ENCOUNTER — Encounter: Payer: Self-pay | Admitting: Nurse Practitioner

## 2024-02-03 ENCOUNTER — Non-Acute Institutional Stay: Payer: Self-pay | Admitting: Nurse Practitioner

## 2024-02-03 DIAGNOSIS — E1169 Type 2 diabetes mellitus with other specified complication: Secondary | ICD-10-CM

## 2024-02-03 DIAGNOSIS — I2510 Atherosclerotic heart disease of native coronary artery without angina pectoris: Secondary | ICD-10-CM

## 2024-02-03 DIAGNOSIS — F5101 Primary insomnia: Secondary | ICD-10-CM

## 2024-02-03 DIAGNOSIS — G609 Hereditary and idiopathic neuropathy, unspecified: Secondary | ICD-10-CM

## 2024-02-03 DIAGNOSIS — I959 Hypotension, unspecified: Secondary | ICD-10-CM | POA: Diagnosis not present

## 2024-02-03 DIAGNOSIS — I479 Paroxysmal tachycardia, unspecified: Secondary | ICD-10-CM

## 2024-02-03 DIAGNOSIS — D500 Iron deficiency anemia secondary to blood loss (chronic): Secondary | ICD-10-CM

## 2024-02-03 DIAGNOSIS — M150 Primary generalized (osteo)arthritis: Secondary | ICD-10-CM

## 2024-02-03 NOTE — Progress Notes (Unsigned)
 Location:  [facility] Room Number: [id number] Place of Service:  ALF [phone]) Provider:  Latrease Kunde X, NP    Patient Care Team: Imelda Jameire Kouba, MD as PCP - General (Internal Medicine) Arleen Lacer, MD as PCP - Cardiology (Cardiology)  Extended Emergency Contact Information Primary Emergency Contact: [name],[name] Mobile Phone: [phone] Relation: Nephew Preferred language: English Interpreter needed? No Secondary Emergency Contact: wright,[name] Mobile Phone: [phone] Relation: Niece  Code Status:  DNR Goals of care: Advanced Directive information    02/03/2024    1:45 PM  Advanced Directives  Does Patient Have a Medical Advance Directive? Yes  Type of Advance Directive Out of facility DNR (pink MOST or yellow form)  Does patient want to make changes to medical advance directive? No - Patient declined  Pre-existing out of facility DNR order (yellow form or pink MOST form) Yellow form placed in chart (order not valid for inpatient use)     Chief Complaint  Patient presents with   Cough     cough    HPI:  Pt is a 89 y.o. male seen today for an acute visit for HPOA reported the patient called and c/o to him: feeling like "electric shock" 3 times last night. He denied headache, dizziness, change of vision, chest pain/pressure, palpitation, cough, SOB, dysuria, he is afebrile, in his usual state of health.   Chronic left arm pain, taking Norco.               The lateral right foot open wound, healed.               Bp controlled, off Metoprolol .  Hematuria, resolved, Plavix  was resumed by Urology 02/12/23             Anemia, blood loss, Hgb 13.1 01/29/24 T2DM, Hgb A1c 8.0 08/05/23,  on Meformin, Jardiance             Urinary frequency, Hx of bladder neoplasm,  2-3x nocturnal urination, on Tamsulosin . 02/05/23 Urology resume Plavix . Cysto followup. BCG (immuno therapy)             CAD, GABG x2, post Op afib, takes Plavix , Jardiance, and statin              Vitamin D  deficiency, on Vit D3, Vit D 51 01/02/23             Vit B12 deficiency, on Vit B12, Vit B12 877             Peripheral neuropathy, on Gabapentin , ambulates with walker.  Glaucoma, wear glasses, on Timolol, Latanoprost HLD on Zetia , Fish oil, Rosuvastatin , LDL 102 01/29/24 Paroxysmal tachycardia, 02/12/23 off Metoprolol  2/2 low Bp,  TSH 2.13 01/29/24       Past Medical History:  Diagnosis Date   Anticoagulant long-term use    plavix   (for hx TIA)   Arthritis    right shoulder    Benign essential tremor    hands -- occasional   CAD S/P percutaneous coronary angioplasty cardiologist-- dr harding   a) PCI and BMS to LAD in 1991; b) CATH 10/2010: Severe 2 Vessel disease = diffuse RCA ( 75% prox, 90% mid & 70 + 75% distal) & LAD (80-90% pre-stent, 75% post-stent) with occluded D1 (filled via OM-D1 collaterals)  -- CABG x 2; c) Myoview  4/'15: INTERMEDIATE RISK - small sized reversible apical/anterolateral defect -- most likely c/w known occlusion of D1 with collaterals.(med Rx)   History of basal cell carcinoma (BCC) excision  History of bladder cancer urologist-- dr eskridge   dx 11/ 2015-- s/p TURBT   History of kidney stones    History of TIA (transient ischemic attack) 12/09/2013   History of urethral stricture    Hyperlipidemia with target LDL less than 70    controlled.   Neuropathy of lower extremity    Nocturia    Osteoporosis    Peripheral neuropathy    lower extremity   Postoperative atrial fibrillation (HCC) 10/2010   after bypass-"shocked back in and no problems since":   per cardiologist note, dr Addie Holstein, no recurrence   Pre-diabetes    S/P CABG x 2 10/18/2010   LIMA-LAD, SVG to RPDA (Dr. Alva Jewels)   TGA (transient global amnesia)    "x3 for hours then goes away"   Transient Dilated idiopathic cardiomyopathy April 2015; 04/2014   a) in setting of TIA: EF 35-40% (reduced from 55-65 percent) mild AI, moderate and dilated aorta.;; b) Recheck Echo 04/2014: EF 55-60%, Gr 1  DD, mild-mod AI.     Wears glasses    Wears partial dentures    upper   Past Surgical History:  Procedure Laterality Date   APPENDECTOMY  as teen   BASAL CELL CARCINOMA EXCISION  07/2014   CARDIAC CATHETERIZATION  10/17/2010   80%prox w/mild in-stent restenosis w/75% mid-LADstenosis aft the stent,multi severe stenoses RCAw/heavily calcified vessel,norm LV   CARDIOVERSION  2012   after bypass surgery   CATARACT EXTRACTION W/ INTRAOCULAR LENS  IMPLANT, BILATERAL  2009  approx.   COCCYX REMOVAL     CORONARY ANGIOPLASTY WITH STENT PLACEMENT  1991   PCI and BMS to LAD   CORONARY ARTERY BYPASS GRAFT  10-18-2010  dr Lander Pines @[facility]    LIMA-LAD, SVG-rPDA   CYSTOSCOPY W/ RETROGRADES N/A 07/12/2014   Procedure: CYSTOSCOPY WITH RETROGRADE PYELOGRAM;  Surgeon: Christina Coyer, MD;  Location: [facility];  Service: Urology;  Laterality: N/A;   CYSTOSCOPY WITH BIOPSY N/A 09/09/2014   Procedure: CYSTO WITH BIOPSY AND FULGERATION, DILATION OF URETHRAL STRICTURE;  Surgeon: Christina Coyer, MD;  Location: [facility];  Service: Urology;  Laterality: N/A;  BLADDER BIOPSY   CYSTOSCOPY WITH BIOPSY N/A 01/03/2015   Procedure: CYSTOSCOPY WITH BIOPSY;  Surgeon: Christina Coyer, MD;  Location: [facility];  Service: Urology;  Laterality: N/A;   CYSTOSCOPY WITH BIOPSY N/A 01/26/2019   Procedure: CYSTOSCOPY WITH BIOPSY, BILATERAL RETROGRADE PYELOGRAM;  Surgeon: Christina Coyer, MD;  Location: [facility];  Service: Urology;  Laterality: N/A;   CYSTOSCOPY WITH FULGERATION N/A 01/26/2019   Procedure: CYSTOSCOPY WITH FULGERATION;  Surgeon: Christina Coyer, MD;  Location[facility];  Service: Urology;  Laterality: N/A;   CYSTOSCOPY WITH URETHRAL DILATATION N/A 01/03/2015   Procedure: CYSTOSCOPY WITH URETHRAL DILATATION;  Surgeon: Christina Coyer, MD;  Location: [facility];  Service: Urology;  Laterality: N/A;   DOPPLER ECHOCARDIOGRAPHY  10/18/2004   EF 55-65%,BORDERLINE -enlarged aortic root,mild aortic  insuff.,trace tricus.insuff.   FINGER SURGERY Left 03-17-2001   dr Donzella Galley  @[facility]    repair complex laceration injury 5th digit   KIDNEY STONE SURGERY  1989   NM MYOVIEW  LTD  12/16/2013   INTERMEDIATE RISK test with small sized reversible perfusion defect in the apical anterolateral wall -- most likely consistent with known occlusion of D1 with collaterals.   shoulder surgery Right 1988   TONSILLECTOMY  as teen   TRANSTHORACIC ECHOCARDIOGRAM  April 2015   EF 35-40%, mild AI, moderately dilated ascending aorta   TRANSURETHRAL RESECTION OF BLADDER TUMOR Right  06/11/2022   Procedure: TRANSURETHRAL RESECTION OF BLADDER TUMOR (TURBT)  >5cm;  Surgeon: Christina Coyer, MD;  Location: [facility];  Service: Urology;  Laterality: Right;   TRANSURETHRAL RESECTION OF PROSTATE N/A 07/12/2014   Procedure: TRANSURETHRAL RESECTION OF THE PROSTATE (TURP) WITH MITOMYCIN  -C;  Surgeon: Christina Coyer, MD;  Location: [facility];  Service: Urology;  Laterality: N/A;   TRANSURETHRAL RESECTION OF PROSTATE N/A 06/11/2022   Procedure: TRANSURETHRAL RESECTION OF THE PROSTATE (TURP);  Surgeon: Christina Coyer, MD;  Location: [facility];  Service: Urology;  Laterality: N/A;  REQUESTING 2 HRS    No Known Allergies  Outpatient Encounter Medications as of 02/03/2024  Medication Sig   acetaminophen  (TYLENOL ) 500 MG tablet Take 1,000 mg by mouth every 6 (six) hours as needed for mild pain.   calcium  elemental as carbonate (CALCIUM  ANTACID ULTRA STRENGTH) 400 MG chewable tablet Chew 1,000 mg by mouth daily.   Cholecalciferol  (VITAMIN D3) 50 MCG (2000 UT) CAPS Take 2,000 Units by mouth daily.   clopidogrel  (PLAVIX ) 75 MG tablet Take 75 mg by mouth daily.   Cyanocobalamin  (VITAMIN B12 PO) Place 5,000 mcg under the tongue daily.   denosumab (PROLIA) 60 MG/ML SOSY injection Inject 60 mg into the skin every 6 (six) months.   diclofenac Sodium (VOLTAREN) 1 % GEL Apply topically 4 (four) times daily.   ezetimibe  (ZETIA ) 10 MG tablet Take  10 mg by mouth daily.   fish oil-omega-3 fatty acids  1000 MG capsule Take 1 g by mouth daily.    gabapentin  (NEURONTIN ) 100 MG capsule Take 100 mg by mouth 2 (two) times daily.   HYDROcodone -acetaminophen  (NORCO/VICODIN) 5-325 MG tablet Take 0.5 tablets by mouth every 12 (twelve) hours as needed for moderate pain (pain score 4-6).   JARDIANCE 25 MG TABS tablet Take 25 mg by mouth daily.   latanoprost (XALATAN) 0.005 % ophthalmic solution Place 1 drop into both eyes at bedtime.   metFORMIN (GLUCOPHAGE) 500 MG tablet Take 500 mg by mouth daily.   mirtazapine (REMERON) 7.5 MG tablet Take 7.5 mg by mouth at bedtime.   Multiple Vitamin (MULTIVITAMIN) capsule Take 1 capsule by mouth at bedtime.    rosuvastatin  (CRESTOR ) 40 MG tablet Take 40 mg by mouth daily.   tamsulosin  (FLOMAX ) 0.4 MG CAPS capsule Take 0.4 mg by mouth at bedtime.   timolol (TIMOPTIC) 0.5 % ophthalmic solution Place 1 drop into both eyes every morning.   Zinc Oxide 10 % OINT Apply 1 Application topically as needed (Apply to right buttocks.).   [DISCONTINUED] JARDIANCE 10 MG TABS tablet Take 10 mg by mouth daily.   HYDROcodone -acetaminophen  (NORCO) 5-325 MG tablet Take 0.5 tablets by mouth daily. (Patient not taking: Reported on 08/21/2023)   LORazepam (ATIVAN) 0.5 MG tablet Take 0.25 mg by mouth as needed for anxiety. (Patient not taking: Reported on 02/03/2024)   mupirocin  ointment (BACTROBAN ) 2 % 1 Application daily. Apply to Right lateral foot wound topically every evening shift for right lateral foot wound Clean with wound cleanser daily, dry, apply Mupirocin  ointment and cover with Mepilex (Patient not taking: Reported on 02/03/2024)   Facility-Administered Encounter Medications as of 02/03/2024  Medication   gemcitabine  (GEMZAR ) chemo syringe for bladder instillation 2,000 mg    Review of Systems  Constitutional:  Negative for appetite change, fatigue and fever.  HENT:  Positive for hearing loss. Negative for congestion and  trouble swallowing.   Eyes:  Positive for visual disturbance.       Low vision  Respiratory:  Negative  for cough.   Cardiovascular:  Negative for leg swelling.  Gastrointestinal:  Negative for abdominal pain and constipation.  Genitourinary:  Positive for frequency. Negative for dysuria and hematuria.  Musculoskeletal:  Positive for arthralgias and gait problem.  Skin:  Negative for color change.  Neurological:  Positive for numbness. Negative for weakness and headaches.       Hx of numbness in legs.   Psychiatric/Behavioral:  Positive for sleep disturbance. Negative for agitation. The patient is not nervous/anxious.     Immunization History  Administered Date(s) Administered   Influenza, High Dose Seasonal PF 06/04/2023   Influenza-Unspecified 06/02/2014   Moderna Covid-19 Vaccine Bivalent Booster 6yrs & up 06/18/2023   PNEUMOCOCCAL CONJUGATE-20 02/11/2023   Tdap 08/25/2022   Zoster Recombinant(Shingrix) 06/18/2018, 08/28/2018   Pertinent  Health Maintenance Due  Topic Date Due   OPHTHALMOLOGY EXAM  Never done   HEMOGLOBIN A1C  02/03/2024   INFLUENZA VACCINE  04/02/2024   FOOT EXAM  07/24/2024      03/28/2019    8:25 AM 03/22/2022   11:09 AM 08/06/2022    9:18 PM 08/25/2022    6:51 PM 02/04/2023    4:10 PM  Fall Risk  Falls in the past year?     1  Was there an injury with Fall?     1  Fall Risk Category Calculator     3  (RETIRED) Patient Fall Risk Level High fall risk Moderate fall risk Low fall risk Low fall risk   Patient at Risk for Falls Due to     History of fall(s);Impaired balance/gait;Impaired mobility  Fall risk Follow up     Falls evaluation completed   Functional Status Survey:    Vitals:   02/03/24 1407  BP: 111/69  Pulse: 72  Resp: 20  Temp: (!) 97.5 F (36.4 C)  SpO2: 96%  Weight: 155 lb 9.6 oz (70.6 kg)  Height: 5\' 9"  (1.753 m)   Body mass index is 22.98 kg/m. Physical Exam Vitals and nursing note reviewed.  Constitutional:       Appearance: Normal appearance.  HENT:     Head: Normocephalic and atraumatic.     Nose: Nose normal.     Mouth/Throat:     Mouth: Mucous membranes are moist.     Comments:   Eyes:     Extraocular Movements: Extraocular movements intact.     Conjunctiva/sclera: Conjunctivae normal.     Pupils: Pupils are equal, round, and reactive to light.  Cardiovascular:     Rate and Rhythm: Normal rate and regular rhythm.     Heart sounds: No murmur heard. Pulmonary:     Effort: Pulmonary effort is normal.     Breath sounds: No rales.  Abdominal:     General: Bowel sounds are normal.     Palpations: Abdomen is soft.     Tenderness: There is no abdominal tenderness.  Musculoskeletal:        General: No tenderness.     Cervical back: Normal range of motion and neck supple.     Right lower leg: No edema.     Left lower leg: No edema.  Skin:    General: Skin is warm and dry.     Findings: Lesion present.     Comments: Lateral right corner of mouth non healing scabbed over area, no s/s of infection.   Neurological:     General: No focal deficit present.     Mental Status: He is alert and oriented to  person, place, and time. Mental status is at baseline.     Gait: Gait abnormal.  Psychiatric:     Comments: Restless at night reportd Smiled, answered questions, followed directions during my examination today.      Labs reviewed: Recent Labs    02/11/23 0000 08/05/23 0000 01/29/24 0000  NA 139 139 139  K 4.6 4.2 4.4  CL 107 105 107  CO2 25* 27* 26*  BUN 18 23* 28*  CREATININE 1.0 0.8 0.9  CALCIUM  8.8 9.2 9.1   Recent Labs    08/05/23 0000 01/29/24 0000  AST 37 21  ALT 43* 17  ALKPHOS 32 28  ALBUMIN 3.8 6.1*   Recent Labs    06/17/23 0000 08/05/23 0000 01/29/24 0000  WBC 4.3 4.3 4.2  NEUTROABS 2,378.00 2,408.00 2,335.00  HGB 13.3* 13.1* 13.1*  HCT 42 41 40*  PLT 159 145* 140*   Lab Results  Component Value Date   TSH 2.13 01/29/2024   Lab Results  Component  Value Date   HGBA1C 8.0 08/05/2023   Lab Results  Component Value Date   CHOL 113 01/29/2024   HDL 40 01/29/2024   LDLCALC 102 01/29/2024   TRIG 102 01/29/2024   CHOLHDL 2.6 12/10/2013    Significant Diagnostic Results in last 30 days:  No results found.  Assessment/Plan Insomnia  HPOA reported the patient called and c/o to him: feeling like "electric shock" 3 times last night. He denied headache, dizziness, change of vision, chest pain/pressure, palpitation, cough, SOB, dysuria, he is afebrile, in his usual state of health.  Not certain of etiology, will obtain EKG, continue monitor for focal neurological symptoms.   Osteoarthritis, multiple sites  taking Norco.   Hypotension  Bp controlled, off Metoprolol .   Blood loss anemia Hgb 13.1 01/29/24  Type 2 diabetes mellitus (HCC) Hgb A1c 8.0 08/05/23,  on Meformin, Jardiance  Two-vessel CAD status post CABG x2;  GABG x2, post Op afib, takes Plavix , Jardiance, and statin  Hereditary and idiopathic peripheral neuropathy  on Vit B12, Vit B12 877             Peripheral neuropathy, on Gabapentin , ambulates with walker.   Paroxysmal tachycardia (HCC) 02/12/23 off Metoprolol  2/2 low Bp,  TSH 2.13 01/29/24, obtain EKG     Family/ staff Communication: plan of care reviewed with the patient and charge nurse.   Labs/tests ordered:  EKG

## 2024-02-05 ENCOUNTER — Encounter: Payer: Self-pay | Admitting: Nurse Practitioner

## 2024-02-05 NOTE — Assessment & Plan Note (Signed)
 taking Norco.

## 2024-02-05 NOTE — Assessment & Plan Note (Signed)
 GABG x2, post Op afib, takes Plavix , Jardiance, and statin

## 2024-02-05 NOTE — Assessment & Plan Note (Signed)
 02/12/23 off Metoprolol  2/2 low Bp,  TSH 2.13 01/29/24, obtain EKG

## 2024-02-05 NOTE — Assessment & Plan Note (Signed)
 on Vit B12, Vit B12 877             Peripheral neuropathy, on Gabapentin , ambulates with walker.

## 2024-02-05 NOTE — Assessment & Plan Note (Signed)
 Bp controlled, off Metoprolol .

## 2024-02-05 NOTE — Assessment & Plan Note (Signed)
 Hgb A1c 8.0 08/05/23,  on Meformin, Jardiance

## 2024-02-05 NOTE — Assessment & Plan Note (Signed)
 HPOA reported the patient called and c/o to him: feeling like "electric shock" 3 times last night. He denied headache, dizziness, change of vision, chest pain/pressure, palpitation, cough, SOB, dysuria, he is afebrile, in his usual state of health.  Not certain of etiology, will obtain EKG, continue monitor for focal neurological symptoms.

## 2024-02-05 NOTE — Assessment & Plan Note (Signed)
 Hgb 13.1 01/29/24

## 2024-03-09 ENCOUNTER — Telehealth (INDEPENDENT_AMBULATORY_CARE_PROVIDER_SITE_OTHER): Payer: Self-pay | Admitting: Audiology

## 2024-03-09 NOTE — Telephone Encounter (Signed)
 Called patient's POA  Enrigue Gin) after he left a voice message saying that Briyan' right aid is not working because he accidentally dropped it in water  and wanted a replacement. After confirming with [employer] he already exhausted the loss and damage warranty for both aids. However both aids are still under warranty. Offered to send it out to [place] as a Psychologist, forensic. He will find someone to bring it over for repair (POA lives in [place]).

## 2024-03-11 ENCOUNTER — Ambulatory Visit (INDEPENDENT_AMBULATORY_CARE_PROVIDER_SITE_OTHER): Payer: Self-pay | Admitting: Audiology

## 2024-03-11 DIAGNOSIS — H903 Sensorineural hearing loss, bilateral: Secondary | ICD-10-CM

## 2024-03-12 NOTE — Progress Notes (Signed)
  [address] [place], [place] [address] [phone]  Hearing Aid Check     GIAN YBARRA 's caregiver came to drop off his right aid after is stopped working after it fell in water .     Right Left  Hearing aid manufacturer [employer] More 1 miniRITE SN: [id number] [employer] More 1 miniRITE SN: [id number]  Hearing aid style Receiver in the ear Receiver in the ear  Hearing aid battery rechargeable rechargeable  Receiver 3-100 3-100  Dome/ custom earpiece 10mm power domes 10 mm power domes  Retention wire    Warranty expiration date 08-23-24 08-23-2024  Loss and Damage used used  Initial fitting date 08-23-21 08-23-21  Device was fit at: [facility]    Right aid was dropped for repair. Sent it to [employer]. We will call the patient's POA when ready to be picked up.     Recommend: Return for a hearing aid check , as needed. Return for a hearing evaluation and to see an ENT, if concerns with hearing changes arise.    Kimimila Tauzin MARIE LEROUX-MARTINEZ, AUD

## 2024-03-24 LAB — HEMOGLOBIN A1C: Hemoglobin A1C: 7.9

## 2024-03-26 ENCOUNTER — Non-Acute Institutional Stay: Admitting: Sports Medicine

## 2024-03-26 ENCOUNTER — Encounter: Payer: Self-pay | Admitting: Sports Medicine

## 2024-03-26 DIAGNOSIS — F0390 Unspecified dementia without behavioral disturbance: Secondary | ICD-10-CM

## 2024-03-26 DIAGNOSIS — N400 Enlarged prostate without lower urinary tract symptoms: Secondary | ICD-10-CM | POA: Diagnosis not present

## 2024-03-26 DIAGNOSIS — I2510 Atherosclerotic heart disease of native coronary artery without angina pectoris: Secondary | ICD-10-CM

## 2024-03-26 DIAGNOSIS — F5101 Primary insomnia: Secondary | ICD-10-CM

## 2024-03-26 DIAGNOSIS — E1169 Type 2 diabetes mellitus with other specified complication: Secondary | ICD-10-CM | POA: Diagnosis not present

## 2024-03-26 NOTE — Progress Notes (Signed)
 Location:  [facility] Home Room Number: 926-A Place of Service:  ALF [phone]) Provider:  Sherlynn Albert, MD   Clarice Nottingham, MD  Patient Care Team: Clarice Nottingham, MD as PCP - General (Internal Medicine) Anner Alm ORN, MD as PCP - Cardiology (Cardiology)  Extended Emergency Contact Information Primary Emergency Contact: [name],[name] Mobile Phone: [phone] Relation: Nephew Preferred language: English Interpreter needed? No Secondary Emergency Contact: wright,[name] Mobile Phone: [phone] Relation: Niece  Code Status: DNR  Goals of care: Advanced Directive information    03/26/2024   12:56 PM  Advanced Directives  Does Patient Have a Medical Advance Directive? Yes  Type of Advance Directive Out of facility DNR (pink MOST or yellow form)  Does patient want to make changes to medical advance directive? No - Patient declined  Pre-existing out of facility DNR order (yellow form or pink MOST form) Yellow form placed in chart (order not valid for inpatient use)     Chief Complaint  Patient presents with   Medical Management of Chronic Issues    Routine visit     HPI:  Pt is a 89 y.o. male with past medical history of major neurocognitive disorder, diabetes, hypertension, CAD, peripheral neuropathy, hyperlipidemia is seen today for medical management of chronic diseases.    Patient seen and examined in his room.  He has caretaker with him. Patient seems pleasant and comfortable and does not appear to be in distress. He is able to stand up and ambulate very short distances with a walker but primarily uses a power scooter. Patient knows his name, cannot remember what he had for lunch. He is not oriented to time or place. As per caregiver no agitation or behavioral issues reported. Patient denies chest pain, shortness of breath, abdominal pain, nausea, vomiting, dysuria, hematuria, bloody or dark-colored stools.  Past Medical History:  Diagnosis  Date   Anticoagulant long-term use    plavix   (for hx TIA)   Arthritis    right shoulder    Benign essential tremor    hands -- occasional   CAD S/P percutaneous coronary angioplasty cardiologist-- dr harding   a) PCI and BMS to LAD in 1991; b) CATH 10/2010: Severe 2 Vessel disease = diffuse RCA ( 75% prox, 90% mid & 70 + 75% distal) & LAD (80-90% pre-stent, 75% post-stent) with occluded D1 (filled via OM-D1 collaterals)  -- CABG x 2; c) Myoview  4/'15: INTERMEDIATE RISK - small sized reversible apical/anterolateral defect -- most likely c/w known occlusion of D1 with collaterals.(med Rx)   History of basal cell carcinoma (BCC) excision    History of bladder cancer urologist-- dr eskridge   dx 11/ 2015-- s/p TURBT   History of kidney stones    History of TIA (transient ischemic attack) 12/09/2013   History of urethral stricture    Hyperlipidemia with target LDL less than 70    controlled.   Neuropathy of lower extremity    Nocturia    Osteoporosis    Peripheral neuropathy    lower extremity   Postoperative atrial fibrillation (HCC) 10/2010   after bypass-shocked back in and no problems since:   per cardiologist note, dr anner, no recurrence   Pre-diabetes    S/P CABG x 2 10/18/2010   LIMA-LAD, SVG to RPDA (Dr. Dusty)   TGA (transient global amnesia)    x3 for hours then goes away   Transient Dilated idiopathic cardiomyopathy April 2015; 04/2014   a) in setting of TIA: EF 35-40% (reduced from  55-65 percent) mild AI, moderate and dilated aorta.;; b) Recheck Echo 04/2014: EF 55-60%, Gr 1 DD, mild-mod AI.     Wears glasses    Wears partial dentures    upper   Past Surgical History:  Procedure Laterality Date   APPENDECTOMY  as teen   BASAL CELL CARCINOMA EXCISION  07/2014   CARDIAC CATHETERIZATION  10/17/2010   80%prox w/mild in-stent restenosis w/75% mid-LADstenosis aft the stent,multi severe stenoses RCAw/heavily calcified vessel,norm LV   CARDIOVERSION  2012   after bypass  surgery   CATARACT EXTRACTION W/ INTRAOCULAR LENS  IMPLANT, BILATERAL  2009  approx.   COCCYX REMOVAL     CORONARY ANGIOPLASTY WITH STENT PLACEMENT  1991   PCI and BMS to LAD   CORONARY ARTERY BYPASS GRAFT  10-18-2010  dr quin @[facility]    LIMA-LAD, SVG-rPDA   CYSTOSCOPY W/ RETROGRADES N/A 07/12/2014   Procedure: CYSTOSCOPY WITH RETROGRADE PYELOGRAM;  Surgeon: Donnice Brooks, MD;  Location: [facility];  Service: Urology;  Laterality: N/A;   CYSTOSCOPY WITH BIOPSY N/A 09/09/2014   Procedure: CYSTO WITH BIOPSY AND FULGERATION, DILATION OF URETHRAL STRICTURE;  Surgeon: Donnice Brooks, MD;  Location: [facility];  Service: Urology;  Laterality: N/A;  BLADDER BIOPSY   CYSTOSCOPY WITH BIOPSY N/A 01/03/2015   Procedure: CYSTOSCOPY WITH BIOPSY;  Surgeon: Donnice Brooks, MD;  Location: [facility];  Service: Urology;  Laterality: N/A;   CYSTOSCOPY WITH BIOPSY N/A 01/26/2019   Procedure: CYSTOSCOPY WITH BIOPSY, BILATERAL RETROGRADE PYELOGRAM;  Surgeon: Brooks Donnice, MD;  Location: [facility];  Service: Urology;  Laterality: N/A;   CYSTOSCOPY WITH FULGERATION N/A 01/26/2019   Procedure: CYSTOSCOPY WITH FULGERATION;  Surgeon: Brooks Donnice, MD;  Location: [facility];  Service: Urology;  Laterality: N/A;   CYSTOSCOPY WITH URETHRAL DILATATION N/A 01/03/2015   Procedure: CYSTOSCOPY WITH URETHRAL DILATATION;  Surgeon: Donnice Brooks, MD;  Location: [facility];  Service: Urology;  Laterality: N/A;   DOPPLER ECHOCARDIOGRAPHY  10/18/2004   EF 55-65%,BORDERLINE -enlarged aortic root,mild aortic insuff.,trace tricus.insuff.   FINGER SURGERY Left 03-17-2001   dr sissy  @[facility]    repair complex laceration injury 5th digit   KIDNEY STONE SURGERY  1989   NM MYOVIEW  LTD  12/16/2013   INTERMEDIATE RISK test with small sized reversible perfusion defect in the apical anterolateral wall -- most likely consistent with known occlusion of D1 with collaterals.   shoulder surgery Right 1988   TONSILLECTOMY   as teen   TRANSTHORACIC ECHOCARDIOGRAM  April 2015   EF 35-40%, mild AI, moderately dilated ascending aorta   TRANSURETHRAL RESECTION OF BLADDER TUMOR Right 06/11/2022   Procedure: TRANSURETHRAL RESECTION OF BLADDER TUMOR (TURBT)  >5cm;  Surgeon: Brooks Donnice, MD;  Location: [facility];  Service: Urology;  Laterality: Right;   TRANSURETHRAL RESECTION OF PROSTATE N/A 07/12/2014   Procedure: TRANSURETHRAL RESECTION OF THE PROSTATE (TURP) WITH MITOMYCIN  -C;  Surgeon: Donnice Brooks, MD;  Location: [facility];  Service: Urology;  Laterality: N/A;   TRANSURETHRAL RESECTION OF PROSTATE N/A 06/11/2022   Procedure: TRANSURETHRAL RESECTION OF THE PROSTATE (TURP);  Surgeon: Brooks Donnice, MD;  Location: [facility];  Service: Urology;  Laterality: N/A;  REQUESTING 2 HRS    No Known Allergies  Allergies as of 03/26/2024   No Known Allergies      Medication List        Accurate as of March 26, 2024 12:57 PM. If you have any questions, ask your nurse or doctor.  acetaminophen  500 MG tablet Commonly known as: TYLENOL  Take 1,000 mg by mouth every 6 (six) hours as needed for mild pain.   Calcium  Antacid Ultra Strength 1000 MG chewable tablet Generic drug: calcium  elemental as carbonate Chew 1,000 mg by mouth daily.   clopidogrel  75 MG tablet Commonly known as: PLAVIX  Take 75 mg by mouth daily.   ezetimibe  10 MG tablet Commonly known as: ZETIA  Take 10 mg by mouth daily.   fish oil-omega-3 fatty acids  1000 MG capsule Take 1 g by mouth daily.   gabapentin  100 MG capsule Commonly known as: NEURONTIN  Take 100 mg by mouth 2 (two) times daily.   HYDROcodone -acetaminophen  5-325 MG tablet Commonly known as: NORCO/VICODIN Take 0.5 tablets by mouth every 12 (twelve) hours as needed for moderate pain (pain score 4-6).   HYDROcodone -acetaminophen  5-325 MG tablet Commonly known as: Norco Take 0.5 tablets by mouth daily.   Jardiance 25 MG Tabs tablet Generic drug: empagliflozin Take  25 mg by mouth daily.   latanoprost 0.005 % ophthalmic solution Commonly known as: XALATAN Place 1 drop into both eyes at bedtime.   LORazepam 0.5 MG tablet Commonly known as: ATIVAN Take 0.25 mg by mouth as needed for anxiety.   metFORMIN 500 MG tablet Commonly known as: GLUCOPHAGE Take 500 mg by mouth daily.   mirtazapine 7.5 MG tablet Commonly known as: REMERON Take 7.5 mg by mouth at bedtime.   multivitamin capsule Take 1 capsule by mouth at bedtime.   mupirocin  ointment 2 % Commonly known as: BACTROBAN  1 Application daily. Apply to Right lateral foot wound topically every evening shift for right lateral foot wound Clean with wound cleanser daily, dry, apply Mupirocin  ointment and cover with Mepilex   polyethylene glycol 17 g packet Commonly known as: MIRALAX / GLYCOLAX Take 17 g by mouth daily. Give 1 packet by mouth one time a day for Constipation Mix in 40oz of fluid   Prolia 60 MG/ML Sosy injection Generic drug: denosumab Inject 60 mg into the skin every 6 (six) months.   rosuvastatin  40 MG tablet Commonly known as: CRESTOR  Take 40 mg by mouth daily.   tamsulosin  0.4 MG Caps capsule Commonly known as: FLOMAX  Take 0.4 mg by mouth at bedtime.   timolol 0.5 % ophthalmic solution Commonly known as: TIMOPTIC Place 1 drop into both eyes every morning.   VITAMIN B12 PO Place 5,000 mcg under the tongue daily.   vitamin D3 50 MCG (2000 UT) Caps Take 2,000 Units by mouth daily.   Voltaren 1 % Gel Generic drug: diclofenac Sodium Apply topically 4 (four) times daily.   Zinc Oxide 10 % Oint Apply 1 Application topically as needed (Apply to right buttocks.).        Review of Systems  Constitutional:  Negative for fever.  Respiratory:  Negative for cough and shortness of breath.   Cardiovascular:  Negative for chest pain.  Gastrointestinal:  Negative for abdominal pain, constipation, diarrhea, nausea and vomiting.  Genitourinary:  Negative for dysuria.   Musculoskeletal:  Positive for arthralgias.  Neurological:  Negative for dizziness.    Immunization History  Administered Date(s) Administered   Influenza, High Dose Seasonal PF 06/04/2023   Influenza-Unspecified 06/02/2014   Moderna Covid-19 Vaccine Bivalent Booster 64yrs & up 06/18/2023   PNEUMOCOCCAL CONJUGATE-20 02/11/2023   Tdap 08/25/2022   Zoster Recombinant(Shingrix) 06/18/2018, 08/28/2018   Pertinent  Health Maintenance Due  Topic Date Due   OPHTHALMOLOGY EXAM  Never done   INFLUENZA VACCINE  04/02/2024   FOOT  EXAM  07/24/2024   HEMOGLOBIN A1C  09/24/2024      03/28/2019    8:25 AM 03/22/2022   11:09 AM 08/06/2022    9:18 PM 08/25/2022    6:51 PM 02/04/2023    4:10 PM  Fall Risk  Falls in the past year?     1  Was there an injury with Fall?     1  Fall Risk Category Calculator     3  (RETIRED) Patient Fall Risk Level High fall risk  Moderate fall risk  Low fall risk  Low fall risk    Patient at Risk for Falls Due to     History of fall(s);Impaired balance/gait;Impaired mobility  Fall risk Follow up     Falls evaluation completed     Data saved with a previous flowsheet row definition   Functional Status Survey:    Vitals:   03/26/24 1255  BP: 121/82  Pulse: 79  Resp: 19  Temp: 97.6 F (36.4 C)  SpO2: 98%  Weight: 152 lb 6.4 oz (69.1 kg)  Height: 5' 9 (1.753 m)   Body mass index is 22.51 kg/m. Physical Exam Constitutional:      Appearance: Normal appearance.  HENT:     Head: Normocephalic and atraumatic.  Cardiovascular:     Rate and Rhythm: Normal rate and regular rhythm.     Pulses: Normal pulses.     Heart sounds: Normal heart sounds.  Pulmonary:     Effort: No respiratory distress.     Breath sounds: No stridor. No wheezing or rales.  Abdominal:     General: Bowel sounds are normal. There is no distension.     Palpations: Abdomen is soft.     Tenderness: There is no abdominal tenderness. There is no guarding.  Musculoskeletal:         General: No swelling.  Neurological:     Mental Status: He is alert. Mental status is at baseline.     Motor: No weakness.     Labs reviewed: Recent Labs    08/05/23 0000 01/29/24 0000  NA 139 139  K 4.2 4.4  CL 105 107  CO2 27* 26*  BUN 23* 28*  CREATININE 0.8 0.9  CALCIUM  9.2 9.1   Recent Labs    08/05/23 0000 01/29/24 0000  AST 37 21  ALT 43* 17  ALKPHOS 32 28  ALBUMIN 3.8 6.1*   Recent Labs    06/17/23 0000 08/05/23 0000 01/29/24 0000  WBC 4.3 4.3 4.2  NEUTROABS 2,378.00 2,408.00 2,335.00  HGB 13.3* 13.1* 13.1*  HCT 42 41 40*  PLT 159 145* 140*   Lab Results  Component Value Date   TSH 2.13 01/29/2024   Lab Results  Component Value Date   HGBA1C 7.9 03/24/2024   Lab Results  Component Value Date   CHOL 113 01/29/2024   HDL 40 01/29/2024   LDLCALC 102 01/29/2024   TRIG 102 01/29/2024   CHOLHDL 2.6 12/10/2013    Significant Diagnostic Results in last 30 days:  No results found.  Assessment/Plan  Major neurocognitive disorder No behavioral manifestations reported by the nursing staff Continue with supportive care Increase cognitively engaging activities   Diabetes mellitus Lab Results  Component Value Date   HGBA1C 7.9 03/24/2024   HGBA1C 8.0 08/05/2023   HGBA1C 7.8 (H) 01/29/2023  Continue with metformin, Jardiance  Lab Results  Component Value Date   CREATININE 0.9 01/29/2024   CREATININE 0.8 08/05/2023   CREATININE 1.0 02/11/2023  Osteoarthritis No recent falls Continue with Tylenol , Norco as needed  History of CAD Continue with Plavix , Crestor     Insomnia Cont with rameron   Neuropathy Stable Continue gabapentin   BPH Stable continue with Flomax .

## 2024-03-31 ENCOUNTER — Telehealth (INDEPENDENT_AMBULATORY_CARE_PROVIDER_SITE_OTHER): Payer: Self-pay | Admitting: Audiology

## 2024-03-31 NOTE — Telephone Encounter (Signed)
 Called and spoke with Marinell Ro, Patient's POA. To let him know that Mr. Fretz's hearing aids are ready for pickup.  Also requested Mr. [name] provide a copy of the POA documentation.  He requested I email the request to him at [email or web address] so that document could be faxed to us .

## 2024-04-01 ENCOUNTER — Telehealth (INDEPENDENT_AMBULATORY_CARE_PROVIDER_SITE_OTHER): Payer: Self-pay | Admitting: Otolaryngology

## 2024-04-01 ENCOUNTER — Telehealth (INDEPENDENT_AMBULATORY_CARE_PROVIDER_SITE_OTHER): Payer: Self-pay | Admitting: Audiology

## 2024-04-01 ENCOUNTER — Ambulatory Visit (INDEPENDENT_AMBULATORY_CARE_PROVIDER_SITE_OTHER): Payer: Self-pay | Admitting: Audiology

## 2024-04-01 DIAGNOSIS — H903 Sensorineural hearing loss, bilateral: Secondary | ICD-10-CM

## 2024-04-01 NOTE — Telephone Encounter (Signed)
 I emailed Marinell Ro, [place] for the patient, at [email or web address] requesting the POA documentation be faxed to us  so that we could have it for our records.

## 2024-04-01 NOTE — Progress Notes (Signed)
  [address], [address] [place], [place] [address] [phone]  Hearing Aid Check     Tanner [name] nursing home aide came to pick up his hearing aid after after it was repaired by [employer].      Right Left  Hearing aid manufacturer [employer] More 1 miniRITE SN: [id number] [employer] More 1 miniRITE SN: [id number]  Hearing aid style Receiver in the ear Receiver in the ear  Hearing aid battery rechargeable rechargeable  Receiver 3-100 3-100  Dome/ custom earpiece 10mm power domes 10 mm power domes  Retention wire      Warranty expiration date 08-23-24 08-23-2024  Loss and Damage used used  Initial fitting date 08-23-21 08-23-21  Device was fit at: [facility]       Actions taken: Listening check was good for the repaired right aid. Previous settings in the hearing aid.  Services fee: $0 was paid at checkout.     Recommend: Return for a hearing aid check , as needed. Return for a hearing evaluation and to see an ENT, if concerns with hearing changes arise.    Mackynzie Woolford MARIE LEROUX-MARTINEZ, AUD

## 2024-04-01 NOTE — Telephone Encounter (Signed)
 Patient's Nurse Aid picked up the Hearing Aid that was sent out for repair.  I obtained verbal authorization from the patient's POA, Marinell Ro, for her to pick them up.

## 2024-06-10 ENCOUNTER — Encounter: Payer: Self-pay | Admitting: Nurse Practitioner

## 2024-06-10 ENCOUNTER — Non-Acute Institutional Stay: Payer: Self-pay | Admitting: Nurse Practitioner

## 2024-06-10 DIAGNOSIS — E785 Hyperlipidemia, unspecified: Secondary | ICD-10-CM | POA: Diagnosis not present

## 2024-06-10 DIAGNOSIS — I479 Paroxysmal tachycardia, unspecified: Secondary | ICD-10-CM

## 2024-06-10 DIAGNOSIS — E538 Deficiency of other specified B group vitamins: Secondary | ICD-10-CM

## 2024-06-10 DIAGNOSIS — I2510 Atherosclerotic heart disease of native coronary artery without angina pectoris: Secondary | ICD-10-CM

## 2024-06-10 DIAGNOSIS — M810 Age-related osteoporosis without current pathological fracture: Secondary | ICD-10-CM

## 2024-06-10 DIAGNOSIS — E1169 Type 2 diabetes mellitus with other specified complication: Secondary | ICD-10-CM

## 2024-06-10 DIAGNOSIS — M150 Primary generalized (osteo)arthritis: Secondary | ICD-10-CM

## 2024-06-10 DIAGNOSIS — Z66 Do not resuscitate: Secondary | ICD-10-CM

## 2024-06-10 DIAGNOSIS — G609 Hereditary and idiopathic neuropathy, unspecified: Secondary | ICD-10-CM | POA: Diagnosis not present

## 2024-06-10 DIAGNOSIS — D500 Iron deficiency anemia secondary to blood loss (chronic): Secondary | ICD-10-CM

## 2024-06-10 DIAGNOSIS — E559 Vitamin D deficiency, unspecified: Secondary | ICD-10-CM

## 2024-06-10 DIAGNOSIS — R350 Frequency of micturition: Secondary | ICD-10-CM

## 2024-06-10 NOTE — Assessment & Plan Note (Signed)
on Vit D3, Vit D 51 01/02/23

## 2024-06-10 NOTE — Assessment & Plan Note (Signed)
 Continue Zetia , Fish oil, Rosuvastatin , LDL 102 01/29/24

## 2024-06-10 NOTE — Assessment & Plan Note (Signed)
 GABG x2, post Op afib, takes Plavix , Jardiance, and statin

## 2024-06-10 NOTE — Assessment & Plan Note (Signed)
 on Vit B12, Vit B12 877

## 2024-06-10 NOTE — Progress Notes (Signed)
 Location:  [facility] Room Number: 926 A Place of Service:  ALF (13) Provider: ManXie Shannen Vernon, N.P.  Patient Care Team: Clarice Nottingham, MD as PCP - General (Internal Medicine) Anner Alm ORN, MD as PCP - Cardiology (Cardiology)  Extended Emergency Contact Information Primary Emergency Contact: [name],[name] Mobile Phone: [phone] Relation: Nephew Preferred language: English Interpreter needed? No Secondary Emergency Contact: wright,[name] Mobile Phone: [phone] Relation: Niece  Code Status:  DNR Goals of care: Advanced Directive information    06/10/2024    8:32 AM  Advanced Directives  Does Patient Have a Medical Advance Directive? Yes  Type of Advance Directive Out of facility DNR (pink MOST or yellow form)  Does patient want to make changes to medical advance directive? No - Patient declined  Pre-existing out of facility DNR order (yellow form or pink MOST form) Yellow form placed in chart (order not valid for inpatient use)     Chief Complaint  Patient presents with   Medical Management of Chronic Issues    Routine visit. Discuss need for covid booster and flu vaccine.     HPI:  Pt is a 89 y.o. male seen today for medical management of chronic diseases.    Osteoporosis, taking Prolia, Ca, Vit D  Chronic left arm pain, taking Norco.             Bp controlled, off Metoprolol .  Hematuria, resolved, Plavix  was resumed by Urology 02/12/23             Anemia, blood loss, Hgb 13.1 01/29/24 T2DM, Hgb A1c 8.0 08/05/23, 7.9 03/24/24,  on Meformin, Jardiance. Healed R lateral diabetic foot ulcer.              Urinary frequency, Hx of bladder neoplasm,  2-3x nocturnal urination, on Tamsulosin . 02/05/23 Urology resume Plavix . Cysto followup. BCG (immuno therapy)             CAD, GABG x2, post Op afib, takes Plavix , Jardiance, and statin             Vitamin D  deficiency, on Vit D3, Vit D 51 01/02/23             Vit B12 deficiency, on Vit B12, Vit B12 877              Peripheral neuropathy, on Gabapentin , ambulates with walker.  Glaucoma, wear glasses, on Timolol, Latanoprost HLD on Zetia , Fish oil, Rosuvastatin , LDL 102 01/29/24 Paroxysmal tachycardia, 02/12/23 off Metoprolol  2/2 low Bp,  TSH 2.13 01/29/24      Past Medical History:  Diagnosis Date   Anticoagulant long-term use    plavix   (for hx TIA)   Arthritis    right shoulder    Benign essential tremor    hands -- occasional   CAD S/P percutaneous coronary angioplasty cardiologist-- dr harding   a) PCI and BMS to LAD in 1991; b) CATH 10/2010: Severe 2 Vessel disease = diffuse RCA ( 75% prox, 90% mid & 70 + 75% distal) & LAD (80-90% pre-stent, 75% post-stent) with occluded D1 (filled via OM-D1 collaterals)  -- CABG x 2; c) Myoview  4/'15: INTERMEDIATE RISK - small sized reversible apical/anterolateral defect -- most likely c/w known occlusion of D1 with collaterals.(med Rx)   History of basal cell carcinoma (BCC) excision    History of bladder cancer urologist-- dr eskridge   dx 11/ 2015-- s/p TURBT   History of kidney stones    History of TIA (transient ischemic attack) 12/09/2013   History  of urethral stricture    Hyperlipidemia with target LDL less than 70    controlled.   Neuropathy of lower extremity    Nocturia    Osteoporosis    Peripheral neuropathy    lower extremity   Postoperative atrial fibrillation (HCC) 10/2010   after bypass-shocked back in and no problems since:   per cardiologist note, dr anner, no recurrence   Pre-diabetes    S/P CABG x 2 10/18/2010   LIMA-LAD, SVG to RPDA (Dr. Dusty)   TGA (transient global amnesia)    x3 for hours then goes away   Transient Dilated idiopathic cardiomyopathy April 2015; 04/2014   a) in setting of TIA: EF 35-40% (reduced from 55-65 percent) mild AI, moderate and dilated aorta.;; b) Recheck Echo 04/2014: EF 55-60%, Gr 1 DD, mild-mod AI.     Wears glasses    Wears partial dentures    upper   Past Surgical History:  Procedure  Laterality Date   APPENDECTOMY  as teen   BASAL CELL CARCINOMA EXCISION  07/2014   CARDIAC CATHETERIZATION  10/17/2010   80%prox w/mild in-stent restenosis w/75% mid-LADstenosis aft the stent,multi severe stenoses RCAw/heavily calcified vessel,norm LV   CARDIOVERSION  2012   after bypass surgery   CATARACT EXTRACTION W/ INTRAOCULAR LENS  IMPLANT, BILATERAL  2009  approx.   COCCYX REMOVAL     CORONARY ANGIOPLASTY WITH STENT PLACEMENT  1991   PCI and BMS to LAD   CORONARY ARTERY BYPASS GRAFT  10-18-2010  dr quin @[facility]    LIMA-LAD, SVG-rPDA   CYSTOSCOPY W/ RETROGRADES N/A 07/12/2014   Procedure: CYSTOSCOPY WITH RETROGRADE PYELOGRAM;  Surgeon: Donnice Brooks, MD;  Location: [facility];  Service: Urology;  Laterality: N/A;   CYSTOSCOPY WITH BIOPSY N/A 09/09/2014   Procedure: CYSTO WITH BIOPSY AND FULGERATION, DILATION OF URETHRAL STRICTURE;  Surgeon: Donnice Brooks, MD;  Location: [facility];  Service: Urology;  Laterality: N/A;  BLADDER BIOPSY   CYSTOSCOPY WITH BIOPSY N/A 01/03/2015   Procedure: CYSTOSCOPY WITH BIOPSY;  Surgeon: Donnice Brooks, MD;  Location: [facility];  Service: Urology;  Laterality: N/A;   CYSTOSCOPY WITH BIOPSY N/A 01/26/2019   Procedure: CYSTOSCOPY WITH BIOPSY, BILATERAL RETROGRADE PYELOGRAM;  Surgeon: Brooks Donnice, MD;  Location: [facility];  Service: Urology;  Laterality: N/A;   CYSTOSCOPY WITH FULGERATION N/A 01/26/2019   Procedure: CYSTOSCOPY WITH FULGERATION;  Surgeon: Brooks Donnice, MD;  Location: [facility];  Service: Urology;  Laterality: N/A;   CYSTOSCOPY WITH URETHRAL DILATATION N/A 01/03/2015   Procedure: CYSTOSCOPY WITH URETHRAL DILATATION;  Surgeon: Donnice Brooks, MD;  Location: [facility];  Service: Urology;  Laterality: N/A;   DOPPLER ECHOCARDIOGRAPHY  10/18/2004   EF 55-65%,BORDERLINE -enlarged aortic root,mild aortic insuff.,trace tricus.insuff.   FINGER SURGERY Left 03-17-2001   dr sissy  @[facility]    repair complex laceration injury  5th digit   KIDNEY STONE SURGERY  1989   NM MYOVIEW  LTD  12/16/2013   INTERMEDIATE RISK test with small sized reversible perfusion defect in the apical anterolateral wall -- most likely consistent with known occlusion of D1 with collaterals.   shoulder surgery Right 1988   TONSILLECTOMY  as teen   TRANSTHORACIC ECHOCARDIOGRAM  April 2015   EF 35-40%, mild AI, moderately dilated ascending aorta   TRANSURETHRAL RESECTION OF BLADDER TUMOR Right 06/11/2022   Procedure: TRANSURETHRAL RESECTION OF BLADDER TUMOR (TURBT)  >5cm;  Surgeon: Brooks Donnice, MD;  Location: [facility];  Service: Urology;  Laterality: Right;   TRANSURETHRAL RESECTION OF PROSTATE  N/A 07/12/2014   Procedure: TRANSURETHRAL RESECTION OF THE PROSTATE (TURP) WITH MITOMYCIN  -C;  Surgeon: Donnice Brooks, MD;  Location: [facility];  Service: Urology;  Laterality: N/A;   TRANSURETHRAL RESECTION OF PROSTATE N/A 06/11/2022   Procedure: TRANSURETHRAL RESECTION OF THE PROSTATE (TURP);  Surgeon: Brooks Donnice, MD;  Location: [facility];  Service: Urology;  Laterality: N/A;  REQUESTING 2 HRS    No Known Allergies  Outpatient Encounter Medications as of 06/10/2024  Medication Sig   acetaminophen  (TYLENOL ) 500 MG tablet Take 1,000 mg by mouth every 6 (six) hours as needed for mild pain.   brimonidine (ALPHAGAN) 0.2 % ophthalmic solution Place 1 drop into both eyes 2 (two) times daily. Must be given at least 10 minutes before or after latanoprost drops   calcium  elemental as carbonate (CALCIUM  ANTACID ULTRA STRENGTH) 400 MG chewable tablet Chew 1,000 mg by mouth daily.   Cholecalciferol  (VITAMIN D3) 50 MCG (2000 UT) CAPS Take 2,000 Units by mouth daily.   clopidogrel  (PLAVIX ) 75 MG tablet Take 75 mg by mouth daily.   Cyanocobalamin  (VITAMIN B12 PO) Place 5,000 mcg under the tongue daily.   denosumab (PROLIA) 60 MG/ML SOSY injection Inject 60 mg into the skin every 6 (six) months.   diclofenac Sodium (VOLTAREN) 1 % GEL Apply topically 4 (four)  times daily.   ezetimibe  (ZETIA ) 10 MG tablet Take 10 mg by mouth daily.   fish oil-omega-3 fatty acids  1000 MG capsule Take 1 g by mouth daily.    gabapentin  (NEURONTIN ) 100 MG capsule Take 100 mg by mouth 2 (two) times daily.   HYDROcodone -acetaminophen  (NORCO/VICODIN) 5-325 MG tablet Take 0.5 tablets by mouth every 12 (twelve) hours as needed for moderate pain (pain score 4-6).   JARDIANCE 25 MG TABS tablet Take 25 mg by mouth daily.   latanoprost (XALATAN) 0.005 % ophthalmic solution Place 1 drop into both eyes at bedtime.   metFORMIN (GLUCOPHAGE) 500 MG tablet Take 500 mg by mouth daily.   mirtazapine (REMERON) 7.5 MG tablet Take 7.5 mg by mouth at bedtime.   Multiple Vitamin (MULTIVITAMIN) capsule Take 1 capsule by mouth at bedtime.    polyethylene glycol (MIRALAX / GLYCOLAX) 17 g packet Take 17 g by mouth daily. Give 1 packet by mouth one time a day for Constipation Mix in 40oz of fluid   rosuvastatin  (CRESTOR ) 40 MG tablet Take 40 mg by mouth daily.   tamsulosin  (FLOMAX ) 0.4 MG CAPS capsule Take 0.4 mg by mouth at bedtime.   Zinc Oxide 10 % OINT Apply 1 Application topically as needed (Apply to right buttocks.).   [DISCONTINUED] HYDROcodone -acetaminophen  (NORCO) 5-325 MG tablet Take 0.5 tablets by mouth daily. (Patient not taking: Reported on 06/10/2024)   [DISCONTINUED] LORazepam (ATIVAN) 0.5 MG tablet Take 0.25 mg by mouth as needed for anxiety. (Patient not taking: Reported on 06/10/2024)   [DISCONTINUED] mupirocin  ointment (BACTROBAN ) 2 % 1 Application daily. Apply to Right lateral foot wound topically every evening shift for right lateral foot wound Clean with wound cleanser daily, dry, apply Mupirocin  ointment and cover with Mepilex (Patient not taking: Reported on 06/10/2024)   [DISCONTINUED] timolol (TIMOPTIC) 0.5 % ophthalmic solution Place 1 drop into both eyes every morning. (Patient not taking: Reported on 06/10/2024)   Facility-Administered Encounter Medications as of 06/10/2024   Medication   gemcitabine  (GEMZAR ) chemo syringe for bladder instillation 2,000 mg    Review of Systems  Constitutional:  Negative for appetite change, fatigue and fever.  HENT:  Positive for hearing loss. Negative  for congestion and trouble swallowing.   Eyes:  Positive for visual disturbance.       Low vision  Respiratory:  Negative for cough.   Cardiovascular:  Negative for leg swelling.  Gastrointestinal:  Negative for abdominal pain and constipation.  Genitourinary:  Positive for frequency. Negative for dysuria and hematuria.  Musculoskeletal:  Positive for arthralgias and gait problem.  Skin:  Negative for color change.  Neurological:  Positive for numbness. Negative for weakness and headaches.       Hx of numbness in legs.   Psychiatric/Behavioral:  Positive for sleep disturbance. Negative for agitation. The patient is not nervous/anxious.     Immunization History  Administered Date(s) Administered   INFLUENZA, HIGH DOSE SEASONAL PF 06/04/2023   Influenza-Unspecified 06/02/2014   Moderna Covid-19 Vaccine Bivalent Booster 61yrs & up 06/18/2023   PNEUMOCOCCAL CONJUGATE-20 02/11/2023   Tdap 08/25/2022   Zoster Recombinant(Shingrix) 06/18/2018, 08/28/2018   Pertinent  Health Maintenance Due  Topic Date Due   Influenza Vaccine  04/02/2024   FOOT EXAM  07/24/2024   HEMOGLOBIN A1C  09/24/2024   OPHTHALMOLOGY EXAM  06/01/2025      03/28/2019    8:25 AM 03/22/2022   11:09 AM 08/06/2022    9:18 PM 08/25/2022    6:51 PM 02/04/2023    4:10 PM  Fall Risk  Falls in the past year?     1  Was there an injury with Fall?     1  Fall Risk Category Calculator     3  (RETIRED) Patient Fall Risk Level High fall risk  Moderate fall risk  Low fall risk  Low fall risk    Patient at Risk for Falls Due to     History of fall(s);Impaired balance/gait;Impaired mobility  Fall risk Follow up     Falls evaluation completed     Data saved with a previous flowsheet row definition   Functional  Status Survey:    Vitals:   06/10/24 0823  BP: 137/81  Pulse: 68  SpO2: 96%  Weight: 157 lb 6.4 oz (71.4 kg)  Height: 5' 9 (1.753 m)   Body mass index is 23.24 kg/m. Physical Exam Vitals and nursing note reviewed.  Constitutional:      Appearance: Normal appearance.  HENT:     Head: Normocephalic and atraumatic.     Nose: Nose normal.     Mouth/Throat:     Mouth: Mucous membranes are moist.     Comments:   Eyes:     Extraocular Movements: Extraocular movements intact.     Conjunctiva/sclera: Conjunctivae normal.     Pupils: Pupils are equal, round, and reactive to light.  Cardiovascular:     Rate and Rhythm: Normal rate and regular rhythm.     Heart sounds: No murmur heard. Pulmonary:     Effort: Pulmonary effort is normal.     Breath sounds: No rales.  Abdominal:     General: Bowel sounds are normal.     Palpations: Abdomen is soft.     Tenderness: There is no abdominal tenderness.  Musculoskeletal:        General: No tenderness.     Cervical back: Normal range of motion and neck supple.     Right lower leg: No edema.     Left lower leg: No edema.  Skin:    General: Skin is warm and dry.     Findings: Lesion present.     Comments: Lateral right corner of mouth non healing scabbed  over area, no s/s of infection.   Neurological:     General: No focal deficit present.     Mental Status: He is alert and oriented to person, place, and time. Mental status is at baseline.     Gait: Gait abnormal.  Psychiatric:     Comments: Restless at night reportd Smiled, answered questions, followed directions during my examination today.      Labs reviewed: Recent Labs    08/05/23 0000 01/29/24 0000  NA 139 139  K 4.2 4.4  CL 105 107  CO2 27* 26*  BUN 23* 28*  CREATININE 0.8 0.9  CALCIUM  9.2 9.1   Recent Labs    08/05/23 0000 01/29/24 0000  AST 37 21  ALT 43* 17  ALKPHOS 32 28  ALBUMIN 3.8 6.1*   Recent Labs    06/17/23 0000 08/05/23 0000 01/29/24 0000   WBC 4.3 4.3 4.2  NEUTROABS 2,378.00 2,408.00 2,335.00  HGB 13.3* 13.1* 13.1*  HCT 42 41 40*  PLT 159 145* 140*   Lab Results  Component Value Date   TSH 2.13 01/29/2024   Lab Results  Component Value Date   HGBA1C 7.9 03/24/2024   Lab Results  Component Value Date   CHOL 113 01/29/2024   HDL 40 01/29/2024   LDLCALC 102 01/29/2024   TRIG 102 01/29/2024   CHOLHDL 2.6 12/10/2013    Significant Diagnostic Results in last 30 days:  No results found.  Assessment/Plan Paroxysmal tachycardia (HCC) Heart rate is in control, off of metoprolol  due to low blood pressure  Dyslipidemia, goal LDL below 70 Continue Zetia , Fish oil, Rosuvastatin , LDL 102 01/29/24  Hereditary and idiopathic peripheral neuropathy  on Gabapentin , ambulates with walker.  Vitamin B12 deficiency  on Vit B12, Vit B12 877  Vitamin D  deficiency on Vit D3, Vit D 51 01/02/23  Two-vessel CAD status post CABG x2;  GABG x2, post Op afib, takes Plavix , Jardiance, and statin  Urinary frequency Hx of bladder neoplasm,  2-3x nocturnal urination, on Tamsulosin . 02/05/23 Urology resume Plavix . Cysto followup. BCG (immuno therapy)  Type 2 diabetes mellitus (HCC)  Hgb A1c 8.0 08/05/23, 7.9 03/24/24,  on Meformin, Jardiance. Healed R lateral diabetic foot ulcer.   Blood loss anemia blood loss, Hgb 13.1 01/29/24  Osteoporosis taking Prolia, Ca, Vit D  Osteoarthritis, multiple sites Chronic left arm pain, taking Norco.     Family/ staff Communication: plan of care reviewed with the patient and charge nurse.   Labs/tests ordered:  none

## 2024-06-10 NOTE — Assessment & Plan Note (Signed)
 blood loss, Hgb 13.1 01/29/24

## 2024-06-10 NOTE — Assessment & Plan Note (Signed)
 Hx of bladder neoplasm,  2-3x nocturnal urination, on Tamsulosin. 02/05/23 Urology resume Plavix. Cysto followup. BCG (immuno therapy)

## 2024-06-10 NOTE — Assessment & Plan Note (Signed)
 Heart rate is in control, off of metoprolol  due to low blood pressure

## 2024-06-10 NOTE — Assessment & Plan Note (Signed)
 Hgb A1c 8.0 08/05/23, 7.9 03/24/24,  on Meformin, Jardiance. Healed R lateral diabetic foot ulcer.

## 2024-06-10 NOTE — Assessment & Plan Note (Signed)
on Gabapentin, ambulates with walker.  

## 2024-06-10 NOTE — Assessment & Plan Note (Signed)
 taking Prolia, Ca, Vit D

## 2024-06-10 NOTE — Assessment & Plan Note (Signed)
 Chronic left arm pain, taking Norco.

## 2024-06-30 ENCOUNTER — Non-Acute Institutional Stay: Admitting: Adult Health

## 2024-06-30 DIAGNOSIS — G629 Polyneuropathy, unspecified: Secondary | ICD-10-CM

## 2024-06-30 DIAGNOSIS — I2510 Atherosclerotic heart disease of native coronary artery without angina pectoris: Secondary | ICD-10-CM

## 2024-06-30 DIAGNOSIS — N400 Enlarged prostate without lower urinary tract symptoms: Secondary | ICD-10-CM | POA: Diagnosis not present

## 2024-06-30 DIAGNOSIS — F5101 Primary insomnia: Secondary | ICD-10-CM | POA: Diagnosis not present

## 2024-06-30 NOTE — Progress Notes (Signed)
 Location:  [facility] Room Number: [phone] [id number] Place of Service:  ALF [phone]) Provider:  Medina-Vargas, Yaxiel Minnie, DNP, FNP-BC  Patient Care Team: Clarice Nottingham, MD as PCP - General (Internal Medicine) Anner Alm ORN, MD as PCP - Cardiology (Cardiology)  Extended Emergency Contact Information Primary Emergency Contact: [name],[name] Mobile Phone: [phone] Relation: Nephew Preferred language: English Interpreter needed? No Secondary Emergency Contact: wright,[name] Mobile Phone: [phone] Relation: Niece  Code Status:   DNR  Goals of care: Advanced Directive information    06/10/2024    8:32 AM  Advanced Directives  Does Patient Have a Medical Advance Directive? Yes  Type of Advance Directive Out of facility DNR (pink MOST or yellow form)  Does patient want to make changes to medical advance directive? No - Patient declined  Pre-existing out of facility DNR order (yellow form or pink MOST form) Yellow form placed in chart (order not valid for inpatient use)     Chief Complaint  Patient presents with   Acute Visit    Has a hard time sleeping    HPI:  Pt is a 89 y.o. male seen today for an acute visit regarding hard time sleeping. He is a resident of [facility]. He was seen in his room with his caregiver. He has a caregiver round the clock. Per caregiver, he would wake up at 3 am and not able to sleep till daytime. He is currently taking Remeron 7.5 mg at bedtime for insomnia.   He has had coronary artery disease for which he takes Plavix , ezetimibe  and Crestor .  He denies chest pains.  He has neuropathy and takes gabapentin    He has BPH and takes Flomax , denies urinary issues.  Past Medical History:  Diagnosis Date   Anticoagulant long-term use    plavix   (for hx TIA)   Arthritis    right shoulder    Benign essential tremor    hands -- occasional   CAD S/P percutaneous coronary angioplasty cardiologist-- dr harding   a) PCI  and BMS to LAD in 1991; b) CATH 10/2010: Severe 2 Vessel disease = diffuse RCA ( 75% prox, 90% mid & 70 + 75% distal) & LAD (80-90% pre-stent, 75% post-stent) with occluded D1 (filled via OM-D1 collaterals)  -- CABG x 2; c) Myoview  4/'15: INTERMEDIATE RISK - small sized reversible apical/anterolateral defect -- most likely c/w known occlusion of D1 with collaterals.(med Rx)   History of basal cell carcinoma (BCC) excision    History of bladder cancer urologist-- dr eskridge   dx 11/ 2015-- s/p TURBT   History of kidney stones    History of TIA (transient ischemic attack) 12/09/2013   History of urethral stricture    Hyperlipidemia with target LDL less than 70    controlled.   Neuropathy of lower extremity    Nocturia    Osteoporosis    Peripheral neuropathy    lower extremity   Postoperative atrial fibrillation (HCC) 10/2010   after bypass-shocked back in and no problems since:   per cardiologist note, dr anner, no recurrence   Pre-diabetes    S/P CABG x 2 10/18/2010   LIMA-LAD, SVG to RPDA (Dr. Dusty)   TGA (transient global amnesia)    x3 for hours then goes away   Transient Dilated idiopathic cardiomyopathy April 2015; 04/2014   a) in setting of TIA: EF 35-40% (reduced from 55-65 percent) mild AI, moderate and dilated aorta.;; b) Recheck Echo 04/2014: EF 55-60%, Gr 1 DD,  mild-mod AI.     Wears glasses    Wears partial dentures    upper   Past Surgical History:  Procedure Laterality Date   APPENDECTOMY  as teen   BASAL CELL CARCINOMA EXCISION  07/2014   CARDIAC CATHETERIZATION  10/17/2010   80%prox w/mild in-stent restenosis w/75% mid-LADstenosis aft the stent,multi severe stenoses RCAw/heavily calcified vessel,norm LV   CARDIOVERSION  2012   after bypass surgery   CATARACT EXTRACTION W/ INTRAOCULAR LENS  IMPLANT, BILATERAL  2009  approx.   COCCYX REMOVAL     CORONARY ANGIOPLASTY WITH STENT PLACEMENT  1991   PCI and BMS to LAD   CORONARY ARTERY BYPASS GRAFT  10-18-2010  dr  quin @[facility]    LIMA-LAD, SVG-rPDA   CYSTOSCOPY W/ RETROGRADES N/A 07/12/2014   Procedure: CYSTOSCOPY WITH RETROGRADE PYELOGRAM;  Surgeon: Donnice Brooks, MD;  Location: [facility];  Service: Urology;  Laterality: N/A;   CYSTOSCOPY WITH BIOPSY N/A 09/09/2014   Procedure: CYSTO WITH BIOPSY AND FULGERATION, DILATION OF URETHRAL STRICTURE;  Surgeon: Donnice Brooks, MD;  Location: [facility];  Service: Urology;  Laterality: N/A;  BLADDER BIOPSY   CYSTOSCOPY WITH BIOPSY N/A 01/03/2015   Procedure: CYSTOSCOPY WITH BIOPSY;  Surgeon: Donnice Brooks, MD;  Location: [facility];  Service: Urology;  Laterality: N/A;   CYSTOSCOPY WITH BIOPSY N/A 01/26/2019   Procedure: CYSTOSCOPY WITH BIOPSY, BILATERAL RETROGRADE PYELOGRAM;  Surgeon: Brooks Donnice, MD;  [facility];  Service: Urology;  Laterality: N/A;   CYSTOSCOPY WITH FULGERATION N/A 01/26/2019   Procedure: CYSTOSCOPY WITH FULGERATION;  Surgeon: Brooks Donnice, MD;  Location: [facility];  Service: Urology;  Laterality: N/A;   CYSTOSCOPY WITH URETHRAL DILATATION N/A 01/03/2015   Procedure: CYSTOSCOPY WITH URETHRAL DILATATION;  Surgeon: Donnice Brooks, MD;  Location: [facility];  Service: Urology;  Laterality: N/A;   DOPPLER ECHOCARDIOGRAPHY  10/18/2004   EF 55-65%,BORDERLINE -enlarged aortic root,mild aortic insuff.,trace tricus.insuff.   FINGER SURGERY Left 03-17-2001   dr sissy  @[facility]    repair complex laceration injury 5th digit   KIDNEY STONE SURGERY  1989   NM MYOVIEW  LTD  12/16/2013   INTERMEDIATE RISK test with small sized reversible perfusion defect in the apical anterolateral wall -- most likely consistent with known occlusion of D1 with collaterals.   shoulder surgery Right 1988   TONSILLECTOMY  as teen   TRANSTHORACIC ECHOCARDIOGRAM  April 2015   EF 35-40%, mild AI, moderately dilated ascending aorta   TRANSURETHRAL RESECTION OF BLADDER TUMOR Right 06/11/2022   Procedure: TRANSURETHRAL RESECTION OF BLADDER TUMOR  (TURBT)  >5cm;  Surgeon: Brooks Donnice, MD;  Location: [facility];  Service: Urology;  Laterality: Right;   TRANSURETHRAL RESECTION OF PROSTATE N/A 07/12/2014   Procedure: TRANSURETHRAL RESECTION OF THE PROSTATE (TURP) WITH MITOMYCIN  -C;  Surgeon: Donnice Brooks, MD;  Location: [facility];  Service: Urology;  Laterality: N/A;   TRANSURETHRAL RESECTION OF PROSTATE N/A 06/11/2022   Procedure: TRANSURETHRAL RESECTION OF THE PROSTATE (TURP);  Surgeon: Brooks Donnice, MD;  Location: [facility];  Service: Urology;  Laterality: N/A;  REQUESTING 2 HRS    No Known Allergies  Outpatient Encounter Medications as of 06/30/2024  Medication Sig   acetaminophen  (TYLENOL ) 500 MG tablet Take 1,000 mg by mouth every 6 (six) hours as needed for mild pain.   brimonidine (ALPHAGAN) 0.2 % ophthalmic solution Place 1 drop into both eyes 2 (two) times daily. Must be given at least 10 minutes before or after latanoprost drops   calcium  elemental as carbonate (  CALCIUM  ANTACID ULTRA STRENGTH) 400 MG chewable tablet Chew 1,000 mg by mouth daily.   Cholecalciferol  (VITAMIN D3) 50 MCG (2000 UT) CAPS Take 2,000 Units by mouth daily.   clopidogrel  (PLAVIX ) 75 MG tablet Take 75 mg by mouth daily.   Cyanocobalamin  (VITAMIN B12 PO) Place 5,000 mcg under the tongue daily.   denosumab (PROLIA) 60 MG/ML SOSY injection Inject 60 mg into the skin every 6 (six) months.   diclofenac Sodium (VOLTAREN) 1 % GEL Apply topically 4 (four) times daily.   ezetimibe  (ZETIA ) 10 MG tablet Take 10 mg by mouth daily.   fish oil-omega-3 fatty acids  1000 MG capsule Take 1 g by mouth daily.    gabapentin  (NEURONTIN ) 100 MG capsule Take 100 mg by mouth 2 (two) times daily.   HYDROcodone -acetaminophen  (NORCO/VICODIN) 5-325 MG tablet Take 0.5 tablets by mouth every 12 (twelve) hours as needed for moderate pain (pain score 4-6).   JARDIANCE 25 MG TABS tablet Take 25 mg by mouth daily.   latanoprost (XALATAN) 0.005 % ophthalmic solution Place 1 drop into  both eyes at bedtime.   metFORMIN (GLUCOPHAGE) 500 MG tablet Take 500 mg by mouth daily.   mirtazapine (REMERON) 7.5 MG tablet Take 7.5 mg by mouth at bedtime.   Multiple Vitamin (MULTIVITAMIN) capsule Take 1 capsule by mouth at bedtime.    polyethylene glycol (MIRALAX / GLYCOLAX) 17 g packet Take 17 g by mouth daily. Give 1 packet by mouth one time a day for Constipation Mix in 40oz of fluid   rosuvastatin  (CRESTOR ) 40 MG tablet Take 40 mg by mouth daily.   tamsulosin  (FLOMAX ) 0.4 MG CAPS capsule Take 0.4 mg by mouth at bedtime.   Zinc Oxide 10 % OINT Apply 1 Application topically as needed (Apply to right buttocks.).   Facility-Administered Encounter Medications as of 06/30/2024  Medication   gemcitabine  (GEMZAR ) chemo syringe for bladder instillation 2,000 mg    Review of Systems  Constitutional:  Negative for activity change, appetite change and fever.  HENT:  Negative for sore throat.   Eyes: Negative.   Cardiovascular:  Negative for chest pain and leg swelling.  Gastrointestinal:  Negative for abdominal distention, diarrhea and vomiting.  Genitourinary:  Negative for dysuria, frequency and urgency.  Skin:  Negative for color change.  Neurological:  Negative for dizziness and headaches.  Psychiatric/Behavioral:  Positive for sleep disturbance. Negative for behavioral problems. The patient is not nervous/anxious.       Immunization History  Administered Date(s) Administered   INFLUENZA, HIGH DOSE SEASONAL PF 06/04/2023   Influenza-Unspecified 06/02/2014   Moderna Covid-19 Vaccine Bivalent Booster 64yrs & up 06/18/2023   PNEUMOCOCCAL CONJUGATE-20 02/11/2023   Tdap 08/25/2022   Zoster Recombinant(Shingrix) 06/18/2018, 08/28/2018   Pertinent  Health Maintenance Due  Topic Date Due   Influenza Vaccine  04/02/2024   FOOT EXAM  07/24/2024   HEMOGLOBIN A1C  09/24/2024   OPHTHALMOLOGY EXAM  06/01/2025      03/28/2019    8:25 AM 03/22/2022   11:09 AM 08/06/2022    9:18 PM  08/25/2022    6:51 PM 02/04/2023    4:10 PM  Fall Risk  Falls in the past year?     1  Was there an injury with Fall?     1  Fall Risk Category Calculator     3  (RETIRED) Patient Fall Risk Level High fall risk  Moderate fall risk  Low fall risk  Low fall risk    Patient at Risk for Falls  Due to     History of fall(s);Impaired balance/gait;Impaired mobility  Fall risk Follow up     Falls evaluation completed     Data saved with a previous flowsheet row definition     Vitals:   06/30/24 1716  BP: 137/81  Pulse: 69  Resp: 19  Temp: (!) 97.1 F (36.2 C)  SpO2: 95%  Weight: 157 lb 6.4 oz (71.4 kg)   Body mass index is 23.24 kg/m.  Physical Exam Constitutional:      General: He is not in acute distress. HENT:     Head: Normocephalic and atraumatic.     Mouth/Throat:     Mouth: Mucous membranes are moist.  Eyes:     Conjunctiva/sclera: Conjunctivae normal.  Cardiovascular:     Rate and Rhythm: Normal rate and regular rhythm.     Pulses: Normal pulses.     Heart sounds: Normal heart sounds.  Pulmonary:     Effort: Pulmonary effort is normal.     Breath sounds: Normal breath sounds.  Abdominal:     General: Bowel sounds are normal.     Palpations: Abdomen is soft.  Musculoskeletal:        General: No swelling.     Cervical back: Normal range of motion.  Skin:    General: Skin is warm and dry.  Neurological:     Mental Status: He is alert.  Psychiatric:        Mood and Affect: Mood normal.        Behavior: Behavior normal.        Labs reviewed: Recent Labs    08/05/23 0000 01/29/24 0000  NA 139 139  K 4.2 4.4  CL 105 107  CO2 27* 26*  BUN 23* 28*  CREATININE 0.8 0.9  CALCIUM  9.2 9.1   Recent Labs    08/05/23 0000 01/29/24 0000  AST 37 21  ALT 43* 17  ALKPHOS 32 28  ALBUMIN 3.8 6.1*   Recent Labs    08/05/23 0000 01/29/24 0000  WBC 4.3 4.2  NEUTROABS 2,408.00 2,335.00  HGB 13.1* 13.1*  HCT 41 40*  PLT 145* 140*   Lab Results   Component Value Date   TSH 2.13 01/29/2024   Lab Results  Component Value Date   HGBA1C 7.9 03/24/2024   Lab Results  Component Value Date   CHOL 113 01/29/2024   HDL 40 01/29/2024   LDLCALC 102 01/29/2024   TRIG 102 01/29/2024   CHOLHDL 2.6 12/10/2013    Significant Diagnostic Results in last 30 days:  No results found.  Assessment/Plan  1. Primary insomnia (Primary) -   Will increase Remeron from 7.5 mg at bedtime to 15 mg at bedtime  2. Atherosclerosis of native coronary artery of native heart without angina pectoris -  Denies chest pains  -   continue Plavix  75 mg daily -   Continue ezetimibe  10 mg daily -   Continue Crestor  40 mg daily  3. Neuropathy -   Stable -  Continue gabapentin  100 mg twice a day      4. Benign prostatic hyperplasia, unspecified whether lower urinary tract symptoms present -   No urinary concerns -   Continue Flomax  0.4 mg at bedtime  Family/ staff Communication: Discussed plan of care with resident and charge nurse.  Labs/tests ordered: None    Nabeeha Badertscher Medina-Vargas, DNP, MSN, FNP-BC [facility] [phone] (Monday-Friday 8:00 a.m. - 5:00 p.m.) [phone] (after hours)

## 2024-07-26 ENCOUNTER — Non-Acute Institutional Stay: Admitting: Nurse Practitioner

## 2024-07-26 ENCOUNTER — Encounter: Payer: Self-pay | Admitting: Nurse Practitioner

## 2024-07-26 DIAGNOSIS — M810 Age-related osteoporosis without current pathological fracture: Secondary | ICD-10-CM | POA: Diagnosis not present

## 2024-07-26 DIAGNOSIS — M150 Primary generalized (osteo)arthritis: Secondary | ICD-10-CM

## 2024-07-26 DIAGNOSIS — F5101 Primary insomnia: Secondary | ICD-10-CM | POA: Diagnosis not present

## 2024-07-26 DIAGNOSIS — Z7984 Long term (current) use of oral hypoglycemic drugs: Secondary | ICD-10-CM

## 2024-07-26 DIAGNOSIS — R350 Frequency of micturition: Secondary | ICD-10-CM

## 2024-07-26 DIAGNOSIS — G609 Hereditary and idiopathic neuropathy, unspecified: Secondary | ICD-10-CM

## 2024-07-26 DIAGNOSIS — E1169 Type 2 diabetes mellitus with other specified complication: Secondary | ICD-10-CM

## 2024-07-26 DIAGNOSIS — J069 Acute upper respiratory infection, unspecified: Secondary | ICD-10-CM | POA: Diagnosis not present

## 2024-07-26 DIAGNOSIS — I479 Paroxysmal tachycardia, unspecified: Secondary | ICD-10-CM

## 2024-07-26 DIAGNOSIS — I2510 Atherosclerotic heart disease of native coronary artery without angina pectoris: Secondary | ICD-10-CM

## 2024-07-26 NOTE — Assessment & Plan Note (Signed)
Continue Norco for pain control.

## 2024-07-26 NOTE — Assessment & Plan Note (Signed)
 At his baseline, no urinary retention, no hematuria

## 2024-07-26 NOTE — Assessment & Plan Note (Signed)
 GABG x2, post Op afib, takes Plavix , Jardiance, and statin

## 2024-07-26 NOTE — Assessment & Plan Note (Signed)
 Heart rate is in control,  02/12/23 off Metoprolol  2/2 low Bp,  TSH 2.13 01/29/24

## 2024-07-26 NOTE — Assessment & Plan Note (Signed)
on Gabapentin, ambulates with walker.  

## 2024-07-26 NOTE — Assessment & Plan Note (Signed)
 nonproductive cough, sore throat, hoarse voice, generalized weakness for 2 days.  Tussin and Claritin as needed available to him, the patient is afebrile, no O2 desaturation, negative COVID test, denied chest pain or palpitation.  His appetite remains the same.  Obtain chest x-ray AP/lateral views, CBC/differential, CMP/eGFR  Empirical antibiotics Z-Pak as directed, please observe

## 2024-07-26 NOTE — Assessment & Plan Note (Signed)
 Mirtazapine was titrated up to 15mg  at bedtime 06/30/24

## 2024-07-26 NOTE — Assessment & Plan Note (Signed)
 taking Prolia, Ca, Vit D Pending DEXA

## 2024-07-26 NOTE — Progress Notes (Signed)
 Location:   [facility] Room Number: 926 Place of Service:  AL[facility] (13) Provider: Larwance Joslynne Klatt NP  Clarice Nottingham, MD  Patient Care Team: Clarice Nottingham, MD as PCP - General (Internal Medicine) Anner Alm ORN, MD as PCP - Cardiology (Cardiology)  Extended Emergency Contact Information Primary Emergency Contact: [name],[name] Mobile Phone: [phone] Relation: Nephew Preferred language: English Interpreter needed? No Secondary Emergency Contact: wright,[name] Mobile Phone: [phone] Relation: Niece  Code Status: DNR Goals of care: Advanced Directive information    06/10/2024    8:32 AM  Advanced Directives  Does Patient Have a Medical Advance Directive? Yes  Type of Advance Directive Out of facility DNR (pink MOST or yellow form)  Does patient want to make changes to medical advance directive? No - Patient declined  Pre-existing out of facility DNR order (yellow form or pink MOST form) Yellow form placed in chart (order not valid for inpatient use)     Chief Complaint  Patient presents with   Acute Visit    Coughing, sore throat, hoarse voice, negative COVID, prn Tussin and Claritin     HPI:  Pt is a 89 y.o. male seen today for an acute visit for nonproductive cough, sore throat, hoarse voice, generalized weakness for 2 days.  Tussin and Claritin as needed available to him, the patient is afebrile, no O2 desaturation, negative COVID test, denied chest pain or palpitation.  His appetite remains the same.  Insomnia, Mirtazapine was titrated up to 15mg  at bedtime 06/30/24  Osteoporosis, taking Prolia, Ca, Vit D, DEXA ordered.              Chronic left arm pain, taking Norco.             Bp controlled, off Metoprolol .  Hematuria, resolved, Plavix  was resumed by Urology 02/12/23             Anemia, blood loss, Hgb 13.1 01/29/24 T2DM, Hgb A1c 8.0 08/05/23, 7.9 03/24/24,  on Meformin, Jardiance. Healed R lateral diabetic foot ulcer.  06/01/2024 eye exam              Urinary frequency, Hx of bladder neoplasm,  2-3x nocturnal urination, on Tamsulosin . 02/05/23 Urology resume Plavix . Cysto followup. BCG (immuno therapy)             CAD, GABG x2, post Op afib, takes Plavix , Jardiance, and statin             Vitamin D  deficiency, on Vit D3, Vit D 51 01/02/23             Vit B12 deficiency, on Vit B12, Vit B12 877             Peripheral neuropathy, on Gabapentin , ambulates with walker.  Glaucoma, wear glasses, on Timolol, Latanoprost HLD on Zetia , Fish oil, Rosuvastatin , LDL 102 01/29/24 Paroxysmal tachycardia, 02/12/23 off Metoprolol  2/2 low Bp,  TSH 2.13 01/29/24   Past Medical History:  Diagnosis Date   Anticoagulant long-term use    plavix   (for hx TIA)   Arthritis    right shoulder    Benign essential tremor    hands -- occasional   CAD S/P percutaneous coronary angioplasty cardiologist-- dr harding   a) PCI and BMS to LAD in 1991; b) CATH 10/2010: Severe 2 Vessel disease = diffuse RCA ( 75% prox, 90% mid & 70 + 75% distal) & LAD (80-90% pre-stent, 75% post-stent) with occluded D1 (filled via OM-D1 collaterals)  -- CABG x 2; c) Myoview  4/'15:  INTERMEDIATE RISK - small sized reversible apical/anterolateral defect -- most likely c/w known occlusion of D1 with collaterals.(med Rx)   History of basal cell carcinoma (BCC) excision    History of bladder cancer urologist-- dr eskridge   dx 11/ 2015-- s/p TURBT   History of kidney stones    History of TIA (transient ischemic attack) 12/09/2013   History of urethral stricture    Hyperlipidemia with target LDL less than 70    controlled.   Neuropathy of lower extremity    Nocturia    Osteoporosis    Peripheral neuropathy    lower extremity   Postoperative atrial fibrillation (HCC) 10/2010   after bypass-shocked back in and no problems since:   per cardiologist note, dr anner, no recurrence   Pre-diabetes    S/P CABG x 2 10/18/2010   LIMA-LAD, SVG to RPDA (Dr. Dusty)   TGA (transient global amnesia)    x3  for hours then goes away   Transient Dilated idiopathic cardiomyopathy April 2015; 04/2014   a) in setting of TIA: EF 35-40% (reduced from 55-65 percent) mild AI, moderate and dilated aorta.;; b) Recheck Echo 04/2014: EF 55-60%, Gr 1 DD, mild-mod AI.     Wears glasses    Wears partial dentures    upper   Past Surgical History:  Procedure Laterality Date   APPENDECTOMY  as teen   BASAL CELL CARCINOMA EXCISION  07/2014   CARDIAC CATHETERIZATION  10/17/2010   80%prox w/mild in-stent restenosis w/75% mid-LADstenosis aft the stent,multi severe stenoses RCAw/heavily calcified vessel,norm LV   CARDIOVERSION  2012   after bypass surgery   CATARACT EXTRACTION W/ INTRAOCULAR LENS  IMPLANT, BILATERAL  2009  approx.   COCCYX REMOVAL     CORONARY ANGIOPLASTY WITH STENT PLACEMENT  1991   PCI and BMS to LAD   CORONARY ARTERY BYPASS GRAFT  10-18-2010  dr quin @[facility]    LIMA-LAD, SVG-rPDA   CYSTOSCOPY W/ RETROGRADES N/A 07/12/2014   Procedure: CYSTOSCOPY WITH RETROGRADE PYELOGRAM;  Surgeon: Donnice Brooks, MD;  Location: [facility];  Service: Urology;  Laterality: N/A;   CYSTOSCOPY WITH BIOPSY N/A 09/09/2014   Procedure: CYSTO WITH BIOPSY AND FULGERATION, DILATION OF URETHRAL STRICTURE;  Surgeon: Donnice Brooks, MD;  Location: [facility];  Service: Urology;  Laterality: N/A;  BLADDER BIOPSY   CYSTOSCOPY WITH BIOPSY N/A 01/03/2015   Procedure: CYSTOSCOPY WITH BIOPSY;  Surgeon: Donnice Brooks, MD;  Location: [facility];  Service: Urology;  Laterality: N/A;   CYSTOSCOPY WITH BIOPSY N/A 01/26/2019   Procedure: CYSTOSCOPY WITH BIOPSY, BILATERAL RETROGRADE PYELOGRAM;  Surgeon: Brooks Donnice, MD;  Location: [facility];  Service: Urology;  Laterality: N/A;   CYSTOSCOPY WITH FULGERATION N/A 01/26/2019   Procedure: CYSTOSCOPY WITH FULGERATION;  Surgeon: Brooks Donnice, MD;  Location: [facility];  Service: Urology;  Laterality: N/A;   CYSTOSCOPY WITH URETHRAL DILATATION N/A 01/03/2015    Procedure: CYSTOSCOPY WITH URETHRAL DILATATION;  Surgeon: Donnice Brooks, MD;  Location: [facility];  Service: Urology;  Laterality: N/A;   DOPPLER ECHOCARDIOGRAPHY  10/18/2004   EF 55-65%,BORDERLINE -enlarged aortic root,mild aortic insuff.,trace tricus.insuff.   FINGER SURGERY Left 03-17-2001   dr sissy  @[facility]    repair complex laceration injury 5th digit   KIDNEY STONE SURGERY  1989   NM MYOVIEW  LTD  12/16/2013   INTERMEDIATE RISK test with small sized reversible perfusion defect in the apical anterolateral wall -- most likely consistent with known occlusion of D1 with collaterals.   shoulder surgery Right 1988  TONSILLECTOMY  as teen   TRANSTHORACIC ECHOCARDIOGRAM  April 2015   EF 35-40%, mild AI, moderately dilated ascending aorta   TRANSURETHRAL RESECTION OF BLADDER TUMOR Right 06/11/2022   Procedure: TRANSURETHRAL RESECTION OF BLADDER TUMOR (TURBT)  >5cm;  Surgeon: Nieves Cough, MD;  Location: [facility];  Service: Urology;  Laterality: Right;   TRANSURETHRAL RESECTION OF PROSTATE N/A 07/12/2014   Procedure: TRANSURETHRAL RESECTION OF THE PROSTATE (TURP) WITH MITOMYCIN  -C;  Surgeon: Cough Nieves, MD;  Location: [facility];  Service: Urology;  Laterality: N/A;   TRANSURETHRAL RESECTION OF PROSTATE N/A 06/11/2022   Procedure: TRANSURETHRAL RESECTION OF THE PROSTATE (TURP);  Surgeon: Nieves Cough, MD;  Location: [facility];  Service: Urology;  Laterality: N/A;  REQUESTING 2 HRS    No Known Allergies  Allergies as of 07/26/2024   No Known Allergies      Medication List        Accurate as of July 26, 2024  2:19 PM. If you have any questions, ask your nurse or doctor.          acetaminophen  500 MG tablet Commonly known as: TYLENOL  Take 1,000 mg by mouth every 6 (six) hours as needed for mild pain.   brimonidine 0.2 % ophthalmic solution Commonly known as: ALPHAGAN Place 1 drop into both eyes 2 (two) times daily. Must be given at least 10 minutes before or after  latanoprost drops   Calcium  Antacid Ultra Strength 1000 MG chewable tablet Generic drug: calcium  elemental as carbonate Chew 1,000 mg by mouth daily.   clopidogrel  75 MG tablet Commonly known as: PLAVIX  Take 75 mg by mouth daily.   ezetimibe  10 MG tablet Commonly known as: ZETIA  Take 10 mg by mouth daily.   fish oil-omega-3 fatty acids  1000 MG capsule Take 1 g by mouth daily.   gabapentin  100 MG capsule Commonly known as: NEURONTIN  Take 100 mg by mouth 2 (two) times daily.   HYDROcodone -acetaminophen  5-325 MG tablet Commonly known as: NORCO/VICODIN Take 0.5 tablets by mouth every 12 (twelve) hours as needed for moderate pain (pain score 4-6).   Jardiance 25 MG Tabs tablet Generic drug: empagliflozin Take 25 mg by mouth daily.   latanoprost 0.005 % ophthalmic solution Commonly known as: XALATAN Place 1 drop into both eyes at bedtime.   metFORMIN 500 MG tablet Commonly known as: GLUCOPHAGE Take 500 mg by mouth daily.   mirtazapine 7.5 MG tablet Commonly known as: REMERON Take 7.5 mg by mouth at bedtime.   multivitamin capsule Take 1 capsule by mouth at bedtime.   polyethylene glycol 17 g packet Commonly known as: MIRALAX / GLYCOLAX Take 17 g by mouth daily. Give 1 packet by mouth one time a day for Constipation Mix in 40oz of fluid   Prolia 60 MG/ML Sosy injection Generic drug: denosumab Inject 60 mg into the skin every 6 (six) months.   rosuvastatin  40 MG tablet Commonly known as: CRESTOR  Take 40 mg by mouth daily.   tamsulosin  0.4 MG Caps capsule Commonly known as: FLOMAX  Take 0.4 mg by mouth at bedtime.   VITAMIN B12 PO Place 5,000 mcg under the tongue daily.   vitamin D3 50 MCG (2000 UT) Caps Take 2,000 Units by mouth daily.   Voltaren 1 % Gel Generic drug: diclofenac Sodium Apply topically 4 (four) times daily.   Zinc Oxide 10 % Oint Apply 1 Application topically as needed (Apply to right buttocks.).        Review of Systems   Constitutional:  Positive for  fatigue. Negative for appetite change and fever.  HENT:  Positive for hearing loss, sore throat and voice change. Negative for rhinorrhea and trouble swallowing.   Eyes:  Positive for visual disturbance.       Low vision  Respiratory:  Negative for cough, choking, chest tightness, shortness of breath and wheezing.   Cardiovascular:  Negative for leg swelling.  Gastrointestinal:  Negative for abdominal pain and constipation.  Genitourinary:  Positive for frequency. Negative for dysuria and hematuria.  Musculoskeletal:  Positive for arthralgias and gait problem.  Skin:  Negative for color change.  Neurological:  Positive for numbness. Negative for weakness and headaches.       Hx of numbness in legs.   Psychiatric/Behavioral:  Positive for sleep disturbance. Negative for agitation. The patient is not nervous/anxious.     Immunization History  Administered Date(s) Administered   INFLUENZA, HIGH DOSE SEASONAL PF 06/04/2023   Influenza-Unspecified 06/02/2014   Moderna Covid-19 Vaccine Bivalent Booster 53yrs & up 06/18/2023   PNEUMOCOCCAL CONJUGATE-20 02/11/2023   Tdap 08/25/2022   Zoster Recombinant(Shingrix) 06/18/2018, 08/28/2018   Pertinent  Health Maintenance Due  Topic Date Due   Influenza Vaccine  04/02/2024   FOOT EXAM  07/24/2024   HEMOGLOBIN A1C  09/24/2024   OPHTHALMOLOGY EXAM  06/01/2025      03/28/2019    8:25 AM 03/22/2022   11:09 AM 08/06/2022    9:18 PM 08/25/2022    6:51 PM 02/04/2023    4:10 PM  Fall Risk  Falls in the past year?     1  Was there an injury with Fall?     1  Fall Risk Category Calculator     3  (RETIRED) Patient Fall Risk Level High fall risk  Moderate fall risk  Low fall risk  Low fall risk    Patient at Risk for Falls Due to     History of fall(s);Impaired balance/gait;Impaired mobility  Fall risk Follow up     Falls evaluation completed     Data saved with a previous flowsheet row definition   Functional Status  Survey:    Vitals:   07/26/24 1349  BP: 115/71  Pulse: 95  Resp: 19  Temp: 97.6 F (36.4 C)  SpO2: 96%  Weight: 150 lb 6.4 oz (68.2 kg)   Body mass index is 22.21 kg/m. Physical Exam Vitals and nursing note reviewed.  Constitutional:      Comments: Appears tired, alert, follow directions and answer to questions appropriately  HENT:     Head: Normocephalic and atraumatic.     Nose: Nose normal.     Mouth/Throat:     Mouth: Mucous membranes are moist.     Pharynx: Posterior oropharyngeal erythema present. No oropharyngeal exudate.     Comments:   Eyes:     Extraocular Movements: Extraocular movements intact.     Conjunctiva/sclera: Conjunctivae normal.     Pupils: Pupils are equal, round, and reactive to light.  Cardiovascular:     Rate and Rhythm: Normal rate and regular rhythm.     Heart sounds: No murmur heard. Pulmonary:     Effort: Pulmonary effort is normal.     Breath sounds: No wheezing or rales.  Chest:     Chest wall: No tenderness.  Abdominal:     General: Bowel sounds are normal.     Palpations: Abdomen is soft.     Tenderness: There is no abdominal tenderness.  Musculoskeletal:        General: No  tenderness.     Cervical back: Normal range of motion and neck supple.     Right lower leg: No edema.     Left lower leg: No edema.  Skin:    General: Skin is warm and dry.     Findings: Lesion present.     Comments: Lateral right corner of mouth non healing scabbed over area  Neurological:     General: No focal deficit present.     Mental Status: He is alert. Mental status is at baseline.     Gait: Gait abnormal.     Comments: Oriented to person and place  Psychiatric:        Mood and Affect: Mood normal.        Behavior: Behavior normal.        Thought Content: Thought content normal.     Comments: Smiled, answered questions, followed directions during my examination today.      Labs reviewed: Recent Labs    08/05/23 0000 01/29/24 0000  NA  139 139  K 4.2 4.4  CL 105 107  CO2 27* 26*  BUN 23* 28*  CREATININE 0.8 0.9  CALCIUM  9.2 9.1   Recent Labs    08/05/23 0000 01/29/24 0000  AST 37 21  ALT 43* 17  ALKPHOS 32 28  ALBUMIN 3.8 6.1*   Recent Labs    08/05/23 0000 01/29/24 0000  WBC 4.3 4.2  NEUTROABS 2,408.00 2,335.00  HGB 13.1* 13.1*  HCT 41 40*  PLT 145* 140*   Lab Results  Component Value Date   TSH 2.13 01/29/2024   Lab Results  Component Value Date   HGBA1C 7.9 03/24/2024   Lab Results  Component Value Date   CHOL 113 01/29/2024   HDL 40 01/29/2024   LDLCALC 102 01/29/2024   TRIG 102 01/29/2024   CHOLHDL 2.6 12/10/2013    Significant Diagnostic Results in last 30 days:  No results found.  Assessment/Plan: Upper respiratory infection   nonproductive cough, sore throat, hoarse voice, generalized weakness for 2 days.  Tussin and Claritin as needed available to him, the patient is afebrile, no O2 desaturation, negative COVID test, denied chest pain or palpitation.  His appetite remains the same.  Obtain chest x-ray AP/lateral views, CBC/differential, CMP/eGFR  Empirical antibiotics Z-Pak as directed, please observe  Insomnia Mirtazapine was titrated up to 15mg  at bedtime 06/30/24  Osteoporosis taking Prolia, Ca, Vit D Pending DEXA  Osteoarthritis, multiple sites Continue Norco for pain control  Type 2 diabetes mellitus (HCC) Hgb A1c 8.0 08/05/23, 7.9 03/24/24,  on Meformin, Jardiance. Healed R lateral diabetic foot ulcer.  06/01/2024 eye exam Obtain HgbA1c  Urinary frequency At his baseline, no urinary retention, no hematuria  Two-vessel CAD status post CABG x2;  GABG x2, post Op afib, takes Plavix , Jardiance, and statin  Hereditary and idiopathic peripheral neuropathy on Gabapentin , ambulates with walker.   Paroxysmal tachycardia (HCC) Heart rate is in control,  02/12/23 off Metoprolol  2/2 low Bp,  TSH 2.13 01/29/24    Family/ staff Communication: Plan of care reviewed with  the patient and charge nurse  Labs/tests ordered:  CXR ap/lateral, CBC/diff, CMP/eGFR, Hgb a1c

## 2024-07-26 NOTE — Assessment & Plan Note (Signed)
 Hgb A1c 8.0 08/05/23, 7.9 03/24/24,  on Meformin, Jardiance. Healed R lateral diabetic foot ulcer.  06/01/2024 eye exam Obtain HgbA1c

## 2024-08-17 ENCOUNTER — Other Ambulatory Visit: Payer: Self-pay | Admitting: Nurse Practitioner

## 2024-08-17 MED ORDER — HYDROCODONE-ACETAMINOPHEN 5-325 MG PO TABS: 0.5000 | ORAL_TABLET | Freq: Two times a day (BID) | ORAL | 0 refills | Status: AC | PRN

## 2024-09-01 ENCOUNTER — Non-Acute Institutional Stay: Admitting: Nurse Practitioner

## 2024-09-01 ENCOUNTER — Encounter: Payer: Self-pay | Admitting: Nurse Practitioner

## 2024-09-01 DIAGNOSIS — E538 Deficiency of other specified B group vitamins: Secondary | ICD-10-CM | POA: Diagnosis not present

## 2024-09-01 DIAGNOSIS — G609 Hereditary and idiopathic neuropathy, unspecified: Secondary | ICD-10-CM

## 2024-09-01 DIAGNOSIS — F5101 Primary insomnia: Secondary | ICD-10-CM | POA: Diagnosis not present

## 2024-09-01 DIAGNOSIS — E1169 Type 2 diabetes mellitus with other specified complication: Secondary | ICD-10-CM | POA: Diagnosis not present

## 2024-09-01 DIAGNOSIS — Z7984 Long term (current) use of oral hypoglycemic drugs: Secondary | ICD-10-CM | POA: Diagnosis not present

## 2024-09-01 DIAGNOSIS — I2510 Atherosclerotic heart disease of native coronary artery without angina pectoris: Secondary | ICD-10-CM | POA: Diagnosis not present

## 2024-09-01 DIAGNOSIS — E559 Vitamin D deficiency, unspecified: Secondary | ICD-10-CM | POA: Diagnosis not present

## 2024-09-01 DIAGNOSIS — E785 Hyperlipidemia, unspecified: Secondary | ICD-10-CM

## 2024-09-01 DIAGNOSIS — I479 Paroxysmal tachycardia, unspecified: Secondary | ICD-10-CM

## 2024-09-01 DIAGNOSIS — Z8551 Personal history of malignant neoplasm of bladder: Secondary | ICD-10-CM | POA: Diagnosis not present

## 2024-09-01 DIAGNOSIS — R269 Unspecified abnormalities of gait and mobility: Secondary | ICD-10-CM | POA: Diagnosis not present

## 2024-09-01 DIAGNOSIS — M150 Primary generalized (osteo)arthritis: Secondary | ICD-10-CM

## 2024-09-01 NOTE — Assessment & Plan Note (Signed)
 Chronic left arm pain, taking Norco.

## 2024-09-01 NOTE — Assessment & Plan Note (Signed)
 No reported chest pain since last seen Continue Plavix , Jardiance, and a statin

## 2024-09-01 NOTE — Assessment & Plan Note (Signed)
Well supplemented

## 2024-09-01 NOTE — Progress Notes (Unsigned)
 " Location:  [facility] Room Number: 926-A Place of Service:  ALF [phone]) Provider:  Duanna Runk X, NP    Patient Care Team: Clarice Nottingham, MD as PCP - General (Internal Medicine) Anner Alm ORN, MD as PCP - Cardiology (Cardiology)  Extended Emergency Contact Information Primary Emergency Contact: [name],[name] Mobile Phone: [phone] Relation: Nephew Preferred language: English Interpreter needed? No Secondary Emergency Contact: wright,[name] Mobile Phone: [phone] Relation: Niece  Code Status:  DNR Goals of care: Advanced Directive information    09/01/2024   11:54 AM  Advanced Directives  Does Patient Have a Medical Advance Directive? Yes  Type of Advance Directive Out of facility DNR (pink MOST or yellow form)  Does patient want to make changes to medical advance directive? No - Patient declined  Pre-existing out of facility DNR order (yellow form or pink MOST form) Yellow form placed in chart (order not valid for inpatient use)     Chief Complaint  Patient presents with   Medical Management of Chronic Issues    Routine Visit, needs foot exam and medicare annual wellness visit    HPI:  Pt is a 89 y.o. male seen today for medical management of chronic diseases.     Insomnia, Mirtazapine was titrated up to 15mg  at bedtime 06/30/24, no significant change in his sleeping pattern: Sleeps mostly during the day and stay awake during night.             Osteoporosis, taking Prolia, Ca, Vit D, DEXA ordered.              Chronic left arm pain, taking Norco.             Bp controlled, off Metoprolol .  Hematuria, resolved, Plavix  was resumed by Urology 02/12/23             Anemia, blood loss, Hgb 13.1 01/29/24 T2DM, Hgb A1c 8.0 08/05/23, 7.9 03/24/24,  on Meformin, Jardiance. Healed R lateral diabetic foot ulcer. 08/16/2024 eye exam             Urinary frequency, Hx of bladder neoplasm,  2-3x nocturnal urination, on Tamsulosin . 02/05/23 Urology resume  Plavix . Cysto followup. BCG (immuno therapy)             CAD, GABG x2, post Op afib, takes Plavix , Jardiance, and statin             Vitamin D  deficiency, on Vit D3, Vit D 51 01/02/23             Vit B12 deficiency, on Vit B12, Vit B12 877             Peripheral neuropathy, on Gabapentin , ambulates with walker.  Glaucoma, wear glasses, on Timolol, Latanoprost HLD on Zetia , Fish oil, Rosuvastatin , LDL 102 01/29/24 Paroxysmal tachycardia, 02/12/23 off Metoprolol  2/2 low Bp,  TSH 2.13 01/29/24 Gait abnormality, ambulates with walker slowly, risk of falling, last fall 08/31/24, mechanical natured, X-ray L hip/pelvis no acute fracture or dislocation.  Able to bear weight/ambulate with walker today.   Past Medical History:  Diagnosis Date   Anticoagulant long-term use    plavix   (for hx TIA)   Arthritis    right shoulder    Benign essential tremor    hands -- occasional   CAD S/P percutaneous coronary angioplasty cardiologist-- dr harding   a) PCI and BMS to LAD in 1991; b) CATH 10/2010: Severe 2 Vessel disease = diffuse RCA ( 75% prox, 90% mid & 70 + 75% distal) &  LAD (80-90% pre-stent, 75% post-stent) with occluded D1 (filled via OM-D1 collaterals)  -- CABG x 2; c) Myoview  4/'15: INTERMEDIATE RISK - small sized reversible apical/anterolateral defect -- most likely c/w known occlusion of D1 with collaterals.(med Rx)   History of basal cell carcinoma (BCC) excision    History of bladder cancer urologist-- dr eskridge   dx 11/ 2015-- s/p TURBT   History of kidney stones    History of TIA (transient ischemic attack) 12/09/2013   History of urethral stricture    Hyperlipidemia with target LDL less than 70    controlled.   Neuropathy of lower extremity    Nocturia    Osteoporosis    Peripheral neuropathy    lower extremity   Postoperative atrial fibrillation (HCC) 10/2010   after bypass-shocked back in and no problems since:   per cardiologist note, dr anner, no recurrence    Pre-diabetes    S/P CABG x 2 10/18/2010   LIMA-LAD, SVG to RPDA (Dr. Dusty)   TGA (transient global amnesia)    x3 for hours then goes away   Transient Dilated idiopathic cardiomyopathy April 2015; 04/2014   a) in setting of TIA: EF 35-40% (reduced from 55-65 percent) mild AI, moderate and dilated aorta.;; b) Recheck Echo 04/2014: EF 55-60%, Gr 1 DD, mild-mod AI.     Wears glasses    Wears partial dentures    upper   Past Surgical History:  Procedure Laterality Date   APPENDECTOMY  as teen   BASAL CELL CARCINOMA EXCISION  07/2014   CARDIAC CATHETERIZATION  10/17/2010   80%prox w/mild in-stent restenosis w/75% mid-LADstenosis aft the stent,multi severe stenoses RCAw/heavily calcified vessel,norm LV   CARDIOVERSION  2012   after bypass surgery   CATARACT EXTRACTION W/ INTRAOCULAR LENS  IMPLANT, BILATERAL  2009  approx.   COCCYX REMOVAL     CORONARY ANGIOPLASTY WITH STENT PLACEMENT  1991   PCI and BMS to LAD   CORONARY ARTERY BYPASS GRAFT  10-18-2010  dr quin @[facility]    LIMA-LAD, SVG-rPDA   CYSTOSCOPY W/ RETROGRADES N/A 07/12/2014   Procedure: CYSTOSCOPY WITH RETROGRADE PYELOGRAM;  Surgeon: Donnice Brooks, MD;  Location: [facility];  Service: Urology;  Laterality: N/A;   CYSTOSCOPY WITH BIOPSY N/A 09/09/2014   Procedure: CYSTO WITH BIOPSY AND FULGERATION, DILATION OF URETHRAL STRICTURE;  Surgeon: Donnice Brooks, MD;  Location: [facility];  Service: Urology;  Laterality: N/A;  BLADDER BIOPSY   CYSTOSCOPY WITH BIOPSY N/A 01/03/2015   Procedure: CYSTOSCOPY WITH BIOPSY;  Surgeon: Donnice Brooks, MD;  Location: [facility];  Service: Urology;  Laterality: N/A;   CYSTOSCOPY WITH BIOPSY N/A 01/26/2019   Procedure: CYSTOSCOPY WITH BIOPSY, BILATERAL RETROGRADE PYELOGRAM;  Surgeon: Brooks Donnice, MD;  Location: [facility];  Service: Urology;  Laterality: N/A;   CYSTOSCOPY WITH FULGERATION N/A 01/26/2019   Procedure: CYSTOSCOPY WITH FULGERATION;  Surgeon: Brooks Donnice,  MD;  Location: [facility];  Service: Urology;  Laterality: N/A;   CYSTOSCOPY WITH URETHRAL DILATATION N/A 01/03/2015   Procedure: CYSTOSCOPY WITH URETHRAL DILATATION;  Surgeon: Donnice Brooks, MD;  Location: [facility];  Service: Urology;  Laterality: N/A;   DOPPLER ECHOCARDIOGRAPHY  10/18/2004   EF 55-65%,BORDERLINE -enlarged aortic root,mild aortic insuff.,trace tricus.insuff.   FINGER SURGERY Left 03-17-2001   dr sissy  @[facility]    repair complex laceration injury 5th digit   KIDNEY STONE SURGERY  1989   NM MYOVIEW  LTD  12/16/2013   INTERMEDIATE RISK test with small sized reversible perfusion defect in the  apical anterolateral wall -- most likely consistent with known occlusion of D1 with collaterals.   shoulder surgery Right 1988   TONSILLECTOMY  as teen   TRANSTHORACIC ECHOCARDIOGRAM  April 2015   EF 35-40%, mild AI, moderately dilated ascending aorta   TRANSURETHRAL RESECTION OF BLADDER TUMOR Right 06/11/2022   Procedure: TRANSURETHRAL RESECTION OF BLADDER TUMOR (TURBT)  >5cm;  Surgeon: Nieves Cough, MD;  Location: [facility];  Service: Urology;  Laterality: Right;   TRANSURETHRAL RESECTION OF PROSTATE N/A 07/12/2014   Procedure: TRANSURETHRAL RESECTION OF THE PROSTATE (TURP) WITH MITOMYCIN  -C;  Surgeon: Cough Nieves, MD;  Location: [facility];  Service: Urology;  Laterality: N/A;   TRANSURETHRAL RESECTION OF PROSTATE N/A 06/11/2022   Procedure: TRANSURETHRAL RESECTION OF THE PROSTATE (TURP);  Surgeon: Nieves Cough, MD;  Location: [facility];  Service: Urology;  Laterality: N/A;  REQUESTING 2 HRS    Allergies[1]  Outpatient Encounter Medications as of 09/01/2024  Medication Sig   acetaminophen  (TYLENOL ) 500 MG tablet Take 1,000 mg by mouth every 6 (six) hours as needed for mild pain.   brimonidine (ALPHAGAN) 0.2 % ophthalmic solution Place 1 drop into both eyes 2 (two) times daily. Must be given at least 10 minutes before or after latanoprost drops   calcium   elemental as carbonate (CALCIUM  ANTACID ULTRA STRENGTH) 400 MG chewable tablet Chew 1,000 mg by mouth daily.   Cholecalciferol  (VITAMIN D3) 50 MCG (2000 UT) CAPS Take 2,000 Units by mouth daily.   clopidogrel  (PLAVIX ) 75 MG tablet Take 75 mg by mouth daily.   Cyanocobalamin  (VITAMIN B12 PO) Place 5,000 mcg under the tongue daily.   diclofenac Sodium (VOLTAREN) 1 % GEL Apply topically 4 (four) times daily.   ezetimibe  (ZETIA ) 10 MG tablet Take 10 mg by mouth daily.   fish oil-omega-3 fatty acids  1000 MG capsule Take 1 g by mouth daily.    gabapentin  (NEURONTIN ) 100 MG capsule Take 100 mg by mouth 2 (two) times daily.   HYDROcodone -acetaminophen  (NORCO/VICODIN) 5-325 MG tablet Take 0.5 tablets by mouth every 12 (twelve) hours as needed for moderate pain (pain score 4-6).   JARDIANCE 25 MG TABS tablet Take 25 mg by mouth daily.   JUBBONTI 60 MG/ML SOSY injection Inject 60 mg into the skin every 6 (six) months.   latanoprost (XALATAN) 0.005 % ophthalmic solution Place 1 drop into both eyes at bedtime.   metFORMIN (GLUCOPHAGE) 500 MG tablet Take 500 mg by mouth daily.   mirtazapine (REMERON) 7.5 MG tablet Take 7.5 mg by mouth at bedtime.   Multiple Vitamin (MULTIVITAMIN) capsule Take 1 capsule by mouth at bedtime.    polyethylene glycol (MIRALAX / GLYCOLAX) 17 g packet Take 17 g by mouth daily. Give 1 packet by mouth one time a day for Constipation Mix in 40oz of fluid   rosuvastatin  (CRESTOR ) 40 MG tablet Take 40 mg by mouth daily.   tamsulosin  (FLOMAX ) 0.4 MG CAPS capsule Take 0.4 mg by mouth at bedtime.   Zinc Oxide 10 % OINT Apply 1 Application topically as needed (Apply to right buttocks.).   [DISCONTINUED] denosumab (PROLIA) 60 MG/ML SOSY injection Inject 60 mg into the skin every 6 (six) months.   Facility-Administered Encounter Medications as of 09/01/2024  Medication   gemcitabine  (GEMZAR ) chemo syringe for bladder instillation 2,000 mg    Review of Systems   Constitutional:  Negative for appetite change and fatigue.  HENT:  Positive for hearing loss. Negative for rhinorrhea and trouble swallowing.   Eyes:  Positive for visual  disturbance.       Low vision  Respiratory:  Negative for cough, chest tightness and shortness of breath.   Cardiovascular:  Negative for leg swelling.  Gastrointestinal:  Negative for abdominal pain and constipation.  Genitourinary:  Positive for frequency. Negative for dysuria and hematuria.  Musculoskeletal:  Positive for arthralgias and gait problem.       Left arm/shoulder pain with ROM is chronic.  Left hip region pain when walking from fall last night 08/31/24  Skin:  Negative for color change.  Neurological:  Positive for numbness. Negative for weakness and headaches.       Hx of numbness in legs.   Psychiatric/Behavioral:  Positive for sleep disturbance. Negative for agitation. The patient is not nervous/anxious.     Immunization History  Administered Date(s) Administered   DTaP 05/18/2014   INFLUENZA, HIGH DOSE SEASONAL PF 06/13/2016, 06/11/2017, 06/04/2023, 06/08/2024   Influenza, Quadrivalent, Recombinant, Inj, Pf 07/08/2018, 06/16/2019, 06/14/2020, 06/18/2021   Influenza-Unspecified 06/14/2011, 06/02/2014, 06/20/2014   Moderna Covid-19 Vaccine Bivalent Booster 63yrs & up 06/18/2023   PNEUMOCOCCAL CONJUGATE-20 02/11/2023   Pneumococcal Conjugate-13 06/06/2016   Pneumococcal Polysaccharide-23 01/11/2008, 05/05/2013   Tdap 05/19/2003, 08/25/2022   Unspecified SARS-COV-2 Vaccination 06/28/2024   Zoster Recombinant(Shingrix) 06/18/2018, 08/28/2018, 08/31/2018   Zoster, Live 05/25/2014   Pertinent  Health Maintenance Due  Topic Date Due   FOOT EXAM  07/24/2024   HEMOGLOBIN A1C  09/24/2024   OPHTHALMOLOGY EXAM  06/01/2025   Influenza Vaccine  Completed      03/28/2019    8:25 AM 03/22/2022   11:09 AM 08/06/2022    9:18 PM 08/25/2022    6:51 PM 02/04/2023    4:10 PM  Fall Risk  Falls  in the past year?     1  Was there an injury with Fall?     1   Fall Risk Category Calculator     3  (RETIRED) Patient Fall Risk Level High fall risk  Moderate fall risk  Low fall risk  Low fall risk    Patient at Risk for Falls Due to     History of fall(s);Impaired balance/gait;Impaired mobility  Fall risk Follow up     Falls evaluation completed     Data saved with a previous flowsheet row definition   Functional Status Survey:    Vitals:   09/01/24 1147  BP: 119/76  Pulse: 80  Resp: 19  Temp: (!) 97.4 F (36.3 C)  SpO2: 97%  Weight: 150 lb 6.4 oz (68.2 kg)  Height: 5' 9 (1.753 m)   Body mass index is 22.21 kg/m. Physical Exam Vitals and nursing note reviewed.  Constitutional:      Appearance: Normal appearance.  HENT:     Head: Normocephalic and atraumatic.     Nose: Nose normal.     Mouth/Throat:     Mouth: Mucous membranes are moist.     Comments:   Eyes:     Extraocular Movements: Extraocular movements intact.     Conjunctiva/sclera: Conjunctivae normal.     Pupils: Pupils are equal, round, and reactive to light.  Cardiovascular:     Rate and Rhythm: Normal rate and regular rhythm.     Heart sounds: No murmur heard. Pulmonary:     Effort: Pulmonary effort is normal.     Breath sounds: No rales.  Abdominal:     General: Bowel sounds are normal.     Palpations: Abdomen is soft.     Tenderness: There is no abdominal tenderness.  Musculoskeletal:        General: Tenderness present.     Cervical back: Normal range of motion and neck supple.     Right lower leg: No edema.     Left lower leg: No edema.     Comments: Left shoulder/arm pain with ROM is chronic Left hip pain when walking since fall last night, mechanical fall  Skin:    General: Skin is warm and dry.     Findings: Lesion present.     Comments: Lateral right corner of mouth non healing scabbed over area  Neurological:     General: No focal deficit present.     Mental Status: He is alert.  Mental status is at baseline.     Gait: Gait abnormal.     Comments: Oriented to person and place  Psychiatric:        Mood and Affect: Mood normal.        Behavior: Behavior normal.        Thought Content: Thought content normal.     Comments: Smiled, answered questions, followed directions during my examination today.      Labs reviewed: Recent Labs    01/29/24 0000  NA 139  K 4.4  CL 107  CO2 26*  BUN 28*  CREATININE 0.9  CALCIUM  9.1   Recent Labs    01/29/24 0000  AST 21  ALT 17  ALKPHOS 28  ALBUMIN 6.1*   Recent Labs    01/29/24 0000  WBC 4.2  NEUTROABS 2,335.00  HGB 13.1*  HCT 40*  PLT 140*   Lab Results  Component Value Date   TSH 2.13 01/29/2024   Lab Results  Component Value Date   HGBA1C 7.9 03/24/2024   Lab Results  Component Value Date   CHOL 113 01/29/2024   HDL 40 01/29/2024   LDLCALC 102 01/29/2024   TRIG 102 01/29/2024   CHOLHDL 2.6 12/10/2013    Significant Diagnostic Results in last 30 days:  No results found.  Assessment/Plan Gait abnormality ambulates with walker slowly, risk of falling, last fall 08/31/24, mechanical natured, X-ray L hip/pelvis no acute fracture or dislocation.  Able to bear weight/ambulate with walker today.  Paroxysmal tachycardia (HCC) Heart rate is in control during my examination today Off metoprolol  due to low blood pressure since 2024  Dyslipidemia, goal LDL below 70 LDL 102 Continue Zetia , fish oil, rosuvastatin   Hereditary and idiopathic peripheral neuropathy Still able to ambulate with walker slowly Continue gabapentin   Vitamin B12 deficiency Well supplemented  Vitamin D  deficiency Well supplemented  Two-vessel CAD status post CABG x2;  No reported chest pain since last seen Continue Plavix , Jardiance, and a statin  History of bladder cancer No hematuria Urinary frequency, Hx of bladder neoplasm,  2-3x nocturnal urination, on Tamsulosin . 02/05/23 Urology resume Plavix . Cysto followup.  BCG (immuno therapy)  Type 2 diabetes mellitus (HCC) Hgb A1c 8.0 08/05/23, 7.9 03/24/24,  on Meformin, Jardiance. Healed R lateral diabetic foot ulcer.  08/16/2024 eye exam  Insomnia Insomnia, Mirtazapine was titrated up to 15mg  at bedtime 06/30/24, no significant change in his sleeping pattern: Sleeps mostly during the day and stay awake during night.  Osteoporosis  taking Prolia, Ca, Vit D, DEXA ordered.   Osteoarthritis, multiple sites Chronic left arm pain, taking Norco.     Family/ staff Communication: Plan of care reviewed with the patient and charge nurse  Labs/tests ordered: X-ray of the left hip/pelvis done 08/31/2024          [  1] No Known Allergies "

## 2024-09-01 NOTE — Assessment & Plan Note (Signed)
 Heart rate is in control during my examination today Off metoprolol  due to low blood pressure since 2024

## 2024-09-01 NOTE — Assessment & Plan Note (Signed)
 Hgb A1c 8.0 08/05/23, 7.9 03/24/24,  on Meformin, Jardiance. Healed R lateral diabetic foot ulcer.  08/16/2024 eye exam

## 2024-09-01 NOTE — Assessment & Plan Note (Signed)
 No hematuria Urinary frequency, Hx of bladder neoplasm,  2-3x nocturnal urination, on Tamsulosin . 02/05/23 Urology resume Plavix . Cysto followup. BCG (immuno therapy)

## 2024-09-01 NOTE — Assessment & Plan Note (Signed)
"   taking Prolia, Ca, Vit D, DEXA ordered.  "

## 2024-09-01 NOTE — Assessment & Plan Note (Signed)
 ambulates with walker slowly, risk of falling, last fall 08/31/24, mechanical natured, X-ray L hip/pelvis no acute fracture or dislocation.  Able to bear weight/ambulate with walker today.

## 2024-09-01 NOTE — Assessment & Plan Note (Signed)
 Insomnia, Mirtazapine was titrated up to 15mg  at bedtime 06/30/24, no significant change in his sleeping pattern: Sleeps mostly during the day and stay awake during night.

## 2024-09-01 NOTE — Assessment & Plan Note (Signed)
 LDL 102 Continue Zetia , fish oil, rosuvastatin 

## 2024-09-01 NOTE — Assessment & Plan Note (Signed)
 Still able to ambulate with walker slowly Continue gabapentin
